# Patient Record
Sex: Male | Born: 1955 | Race: Black or African American | Hispanic: No | Marital: Married | Smoking: Former smoker
Health system: Southern US, Community
[De-identification: ages and names within clinical notes are randomized; demographics above are authoritative.]

## PROBLEM LIST (undated history)

## (undated) DIAGNOSIS — F1010 Alcohol abuse, uncomplicated: Secondary | ICD-10-CM

## (undated) DIAGNOSIS — I509 Heart failure, unspecified: Secondary | ICD-10-CM

## (undated) DIAGNOSIS — I10 Essential (primary) hypertension: Secondary | ICD-10-CM

## (undated) DIAGNOSIS — Z9119 Patient's noncompliance with other medical treatment and regimen: Secondary | ICD-10-CM

## (undated) DIAGNOSIS — E785 Hyperlipidemia, unspecified: Secondary | ICD-10-CM

## (undated) DIAGNOSIS — I4891 Unspecified atrial fibrillation: Secondary | ICD-10-CM

## (undated) DIAGNOSIS — Z91199 Patient's noncompliance with other medical treatment and regimen due to unspecified reason: Secondary | ICD-10-CM

## (undated) DIAGNOSIS — N183 Chronic kidney disease, stage 3 unspecified: Secondary | ICD-10-CM

## (undated) HISTORY — DX: Heart failure, unspecified: I50.9

---

## 2001-05-22 ENCOUNTER — Emergency Department (HOSPITAL_COMMUNITY): Admission: EM | Admit: 2001-05-22 | Discharge: 2001-05-22 | Payer: Self-pay | Admitting: *Deleted

## 2001-08-13 ENCOUNTER — Emergency Department (HOSPITAL_COMMUNITY): Admission: EM | Admit: 2001-08-13 | Discharge: 2001-08-13 | Payer: Self-pay | Admitting: Emergency Medicine

## 2008-02-03 ENCOUNTER — Emergency Department (HOSPITAL_COMMUNITY): Admission: EM | Admit: 2008-02-03 | Discharge: 2008-02-03 | Payer: Self-pay | Admitting: Emergency Medicine

## 2009-06-04 ENCOUNTER — Emergency Department (HOSPITAL_COMMUNITY): Admission: EM | Admit: 2009-06-04 | Discharge: 2009-06-04 | Payer: Self-pay | Admitting: Emergency Medicine

## 2009-06-18 ENCOUNTER — Emergency Department (HOSPITAL_COMMUNITY): Admission: EM | Admit: 2009-06-18 | Discharge: 2009-06-18 | Payer: Self-pay | Admitting: Emergency Medicine

## 2009-07-02 ENCOUNTER — Encounter: Payer: Self-pay | Admitting: Orthopedic Surgery

## 2011-04-30 LAB — CBC
HCT: 47.6
MCV: 81.5
Platelets: 178
RDW: 15.2

## 2011-04-30 LAB — DIFFERENTIAL
Basophils Absolute: 0
Lymphocytes Relative: 16
Monocytes Absolute: 0.6
Neutro Abs: 5.4
Neutrophils Relative %: 73

## 2011-04-30 LAB — COMPREHENSIVE METABOLIC PANEL
Albumin: 4
BUN: 11
Chloride: 103
Creatinine, Ser: 1.46
Total Bilirubin: 1.3 — ABNORMAL HIGH
Total Protein: 7.6

## 2011-04-30 LAB — URINALYSIS, ROUTINE W REFLEX MICROSCOPIC
Bilirubin Urine: NEGATIVE
Nitrite: NEGATIVE
Urobilinogen, UA: 0.2

## 2011-08-17 ENCOUNTER — Emergency Department (HOSPITAL_COMMUNITY)
Admission: EM | Admit: 2011-08-17 | Discharge: 2011-08-17 | Disposition: A | Discharge status: Home / Self Care | Payer: Self-pay | Attending: Emergency Medicine | Admitting: Emergency Medicine

## 2011-08-17 ENCOUNTER — Encounter (HOSPITAL_COMMUNITY): Payer: Self-pay

## 2011-08-17 DIAGNOSIS — IMO0002 Reserved for concepts with insufficient information to code with codable children: Secondary | ICD-10-CM | POA: Insufficient documentation

## 2011-08-17 DIAGNOSIS — I10 Essential (primary) hypertension: Secondary | ICD-10-CM | POA: Insufficient documentation

## 2011-08-17 DIAGNOSIS — Y92009 Unspecified place in unspecified non-institutional (private) residence as the place of occurrence of the external cause: Secondary | ICD-10-CM | POA: Insufficient documentation

## 2011-08-17 DIAGNOSIS — S39013A Strain of muscle, fascia and tendon of pelvis, initial encounter: Secondary | ICD-10-CM

## 2011-08-17 DIAGNOSIS — X500XXA Overexertion from strenuous movement or load, initial encounter: Secondary | ICD-10-CM | POA: Insufficient documentation

## 2011-08-17 HISTORY — DX: Essential (primary) hypertension: I10

## 2011-08-17 MED ORDER — HYDROCODONE-ACETAMINOPHEN 5-325 MG PO TABS: 1.0000 | ORAL_TABLET | ORAL | Status: AC | PRN

## 2011-08-17 MED ORDER — HYDROCODONE-ACETAMINOPHEN 5-325 MG PO TABS
1.0000 | ORAL_TABLET | Freq: Once | ORAL | Status: AC
  Administered 2011-08-17: 1 via ORAL
  Filled 2011-08-17: qty 1

## 2011-08-17 MED ORDER — IBUPROFEN 800 MG PO TABS: 800.0000 mg | ORAL_TABLET | Freq: Three times a day (TID) | ORAL | Status: AC

## 2011-08-17 MED ORDER — IBUPROFEN 800 MG PO TABS
800.0000 mg | ORAL_TABLET | Freq: Once | ORAL | Status: AC
  Administered 2011-08-17: 800 mg via ORAL
  Filled 2011-08-17: qty 1

## 2011-08-17 NOTE — ED Provider Notes (Signed)
History     CSN: [id number]  Arrival date & time 08/17/11  0710   First MD Initiated Contact with Patient 08/17/11 [phone]      Chief Complaint  Patient presents with  . Back Pain    (Consider location/radiation/quality/duration/timing/severity/associated sxs/prior treatment) HPI  Timothy [name] is a 56 y.o. male who presents to the Emergency Department complaining of right groin pain for two days following lifting a heavy basket of clothes on Friday. Pain increases when he is walking and when he bends his right leg. Nothing makes the pain better and he has taken no medicines. Denies fever, chills, numbness, tingling. Occasional radiation down the right thigh.   [facility]   Past Medical History  Diagnosis Date  . Hypertension     History reviewed. No pertinent past surgical history.  No family history on file.  History  Substance Use Topics  . Smoking status: Never Smoker   . Smokeless tobacco: Not on file  . Alcohol Use: Yes     occasionally      Review of Systems 10 Systems reviewed and are negative for acute change except as noted in the HPI. Allergies  Review of patient's allergies indicates no known allergies.  Home Medications  No current outpatient prescriptions on file.  BP 164/91  Pulse 94  Temp 98.3 F (36.8 C)  Resp 18  Ht 5\' 6"  (1.676 m)  Wt 210 lb (95.255 kg)  BMI 33.89 kg/m2  SpO2 97%  Physical Exam  Nursing note and vitals reviewed. Constitutional: He is oriented to person, place, and time. He appears well-developed and well-nourished.  HENT:  Head: Normocephalic.  Right Ear: External ear normal.  Left Ear: External ear normal.  Eyes: EOM are normal. Pupils are equal, round, and reactive to light.  Cardiovascular: Normal rate, normal heart sounds and intact distal pulses.   Pulmonary/Chest: Effort normal.  Abdominal: Soft.       Mild tenderness to Right groin area with palpation. No appreciated hernia.     Musculoskeletal:       FROM at right hip, knee, ankle. NO effusion to knee or ankle.   Neurological: He is alert and oriented to person, place, and time. He has normal reflexes.  Skin: Skin is warm and dry.    ED Course  Procedures (including critical care time)     MDM  Patient with muscle strain injury to right groin. Given antiinflammatory and analgesic.Pt stable in ED with no significant deterioration in condition.The patient appears reasonably screened and/or stabilized for discharge and I doubt any other medical condition or other [facility] requiring further screening, evaluation, or treatment in the ED at this time prior to discharge.  MDM Reviewed: nursing note and vitals           Nicoletta Dress. Colon Branch, MD 08/17/11 [id number]

## 2011-08-17 NOTE — ED Notes (Signed)
Pt c/o pain in r lower back radiating down r leg since Friday.   Denies injury.

## 2011-09-13 ENCOUNTER — Encounter (HOSPITAL_COMMUNITY): Payer: Self-pay

## 2011-09-13 ENCOUNTER — Emergency Department (HOSPITAL_COMMUNITY)
Admission: EM | Admit: 2011-09-13 | Discharge: 2011-09-13 | Disposition: A | Discharge status: Home / Self Care | Payer: Self-pay | Attending: Emergency Medicine | Admitting: Emergency Medicine

## 2011-09-13 DIAGNOSIS — M5430 Sciatica, unspecified side: Secondary | ICD-10-CM

## 2011-09-13 DIAGNOSIS — M25559 Pain in unspecified hip: Secondary | ICD-10-CM | POA: Insufficient documentation

## 2011-09-13 DIAGNOSIS — M79609 Pain in unspecified limb: Secondary | ICD-10-CM | POA: Insufficient documentation

## 2011-09-13 DIAGNOSIS — M545 Low back pain, unspecified: Secondary | ICD-10-CM | POA: Insufficient documentation

## 2011-09-13 DIAGNOSIS — I10 Essential (primary) hypertension: Secondary | ICD-10-CM | POA: Insufficient documentation

## 2011-09-13 MED ORDER — OXYCODONE-ACETAMINOPHEN 5-325 MG PO TABS: 1.0000 | ORAL_TABLET | ORAL | Status: AC | PRN

## 2011-09-13 MED ORDER — KETOROLAC TROMETHAMINE 60 MG/2ML IM SOLN
60.0000 mg | Freq: Once | INTRAMUSCULAR | Status: AC
  Administered 2011-09-13: 60 mg via INTRAMUSCULAR
  Filled 2011-09-13: qty 2

## 2011-09-13 MED ORDER — NAPROXEN 500 MG PO TABS: 500.0000 mg | ORAL_TABLET | Freq: Two times a day (BID) | ORAL | Status: DC

## 2011-09-13 MED ORDER — METHOCARBAMOL 500 MG PO TABS
1000.0000 mg | ORAL_TABLET | Freq: Once | ORAL | Status: AC
  Administered 2011-09-13: 1000 mg via ORAL
  Filled 2011-09-13: qty 2

## 2011-09-13 MED ORDER — CYCLOBENZAPRINE HCL 10 MG PO TABS: 10.0000 mg | ORAL_TABLET | Freq: Three times a day (TID) | ORAL | Status: AC | PRN

## 2011-09-13 NOTE — ED Provider Notes (Signed)
Medical screening examination/treatment/procedure(s) were performed by non-physician practitioner and as supervising physician I was immediately available for consultation/collaboration.   Loren Racer, MD 09/13/11 2340

## 2011-09-13 NOTE — ED Provider Notes (Signed)
History     CSN: [id number]  Arrival date & time 09/13/11  1851   First MD Initiated Contact with Patient 09/13/11 1911      Chief Complaint  Patient presents with  . Hip Pain  . Leg Pain    (Consider location/radiation/quality/duration/timing/severity/associated sxs/prior treatment) HPI Comments: Patient complains of right-sided low back pain for 2 weeks. He states pain radiates into his right hip and right upper leg. Pain is worse with excessive sitting and certain movements. Pain improves somewhat with rest.  He reports history of frequent back pain. He denies any numbness, weakness, incontinence of urine or feces, or abdominal pain. He has been alternating ice and heat to his back and hip without improvement.  Patient is a 56 y.o. male presenting with back pain. The history is provided by the patient. No language interpreter was used.  Back Pain  This is a new problem. The current episode started more than 1 week ago. The problem occurs constantly. The problem has not changed since onset.The pain is associated with no known injury. The pain is present in the lumbar spine and sacro-iliac joint. The quality of the pain is described as aching and shooting. The pain radiates to the right thigh. The pain is moderate. The symptoms are aggravated by bending, twisting and certain positions. The pain is the same all the time. Associated symptoms include leg pain. Pertinent negatives include no chest pain, no fever, no numbness, no headaches, no abdominal pain, no abdominal swelling, no bowel incontinence, no perianal numbness, no bladder incontinence, no dysuria, no pelvic pain, no paresthesias, no paresis, no tingling and no weakness. He has tried ice and heat for the symptoms. The treatment provided no relief.    Past Medical History  Diagnosis Date  . Hypertension     History reviewed. No pertinent past surgical history.  History reviewed. No pertinent family history.  History    Substance Use Topics  . Smoking status: Never Smoker   . Smokeless tobacco: Not on file  . Alcohol Use: Yes     occasionally      Review of Systems  Constitutional: Negative for fever, chills, activity change and appetite change.  HENT: Negative for neck pain and neck stiffness.   Respiratory: Negative for chest tightness and shortness of breath.   Cardiovascular: Negative for chest pain.  Gastrointestinal: Negative for nausea, vomiting, abdominal pain and bowel incontinence.  Genitourinary: Negative for bladder incontinence, dysuria, urgency, frequency, hematuria, decreased urine volume, difficulty urinating, testicular pain and pelvic pain.  Musculoskeletal: Positive for back pain. Negative for arthralgias.  Skin: Negative.   Neurological: Negative for dizziness, tingling, weakness, numbness, headaches and paresthesias.  Psychiatric/Behavioral: Negative for confusion and decreased concentration.  All other systems reviewed and are negative.    Allergies  Review of patient's allergies indicates no known allergies.  Home Medications  No current outpatient prescriptions on file.  BP 183/96  Pulse 88  Temp(Src) 97.5 F (36.4 C) (Oral)  Resp 18  Ht 5\' 4"  (1.626 m)  Wt 210 lb (95.255 kg)  BMI 36.05 kg/m2  SpO2 100%  Physical Exam  Nursing note and vitals reviewed. Constitutional: He is oriented to person, place, and time. He appears well-developed and well-nourished. No distress.  HENT:  Head: Normocephalic and atraumatic.  Neck: Normal range of motion. Neck supple.  Cardiovascular: Normal rate, regular rhythm, normal heart sounds and intact distal pulses.   No murmur heard. Pulmonary/Chest: Effort normal and breath sounds normal.  Abdominal: Soft. Bowel  sounds are normal.  Musculoskeletal: He exhibits tenderness. He exhibits no edema.       Lumbar back: He exhibits tenderness and pain. He exhibits normal range of motion, no bony tenderness, no swelling, no edema, no  deformity, no laceration and normal pulse.       Back:  Neurological: He is alert and oriented to person, place, and time. No cranial nerve deficit. He exhibits normal muscle tone. Gait normal.  Reflex Scores:      Patellar reflexes are 2+ on the right side and 2+ on the left side.      Achilles reflexes are 2+ on the right side and 2+ on the left side. Skin: Skin is warm and dry.    ED Course  Procedures (including critical care time)       MDM     Patient has tenderness to palpation of the right lumbar paraspinal muscles and right SI joint. He ambulates with a steady gait. No focal neuro deficits on exam.  Pain is likely related to sciatica. Will treat with pain medication, anti-inflammatory, and muscle relaxer, he agrees to close followup with his primary care physician if the symptoms are not improving.  Pt feels improved after observation and/or treatment in ED. Patient / Family / Caregiver understand and agree with initial ED impression and plan with expectations set for ED visit. Pt stable in ED with no significant deterioration in condition.    Terrace Chiem L. [place], [place] 09/13/11 2008

## 2011-09-13 NOTE — ED Notes (Signed)
Pt presents with right hip and leg pain x 2 weeks.

## 2012-05-30 ENCOUNTER — Emergency Department (HOSPITAL_COMMUNITY)
Admission: EM | Admit: 2012-05-30 | Discharge: 2012-05-30 | Disposition: A | Discharge status: Home / Self Care | Payer: Self-pay | Attending: Emergency Medicine | Admitting: Emergency Medicine

## 2012-05-30 ENCOUNTER — Encounter (HOSPITAL_COMMUNITY): Payer: Self-pay | Admitting: *Deleted

## 2012-05-30 DIAGNOSIS — Z79899 Other long term (current) drug therapy: Secondary | ICD-10-CM | POA: Insufficient documentation

## 2012-05-30 DIAGNOSIS — I10 Essential (primary) hypertension: Secondary | ICD-10-CM | POA: Insufficient documentation

## 2012-05-30 DIAGNOSIS — Z7982 Long term (current) use of aspirin: Secondary | ICD-10-CM | POA: Insufficient documentation

## 2012-05-30 MED ORDER — AMLODIPINE BESYLATE 10 MG PO TABS: 10.0000 mg | ORAL_TABLET | Freq: Every day | ORAL | Status: DC

## 2012-05-30 MED ORDER — QUINAPRIL-HYDROCHLOROTHIAZIDE 20-25 MG PO TABS: 1.0000 | ORAL_TABLET | Freq: Every day | ORAL | Status: DC

## 2012-05-30 NOTE — ED Provider Notes (Signed)
History   This chart was scribed for Shelda Jakes, MD by Gerlean Ren. This patient was seen in room APA15/APA15 and the patient's care was started at 12:38.   CSN: [id number]  Arrival date & time 05/30/12  1204   First MD Initiated Contact with Patient 05/30/12 1221      Chief Complaint  Patient presents with  . Hypertension    (Consider location/radiation/quality/duration/timing/severity/associated sxs/prior treatment) The history is provided by the patient. No language interpreter was used.   Timothy [name] is a 56 y.o. male with h/o hypertension sent to the Emergency Department from health department clinic for hypertension although he had no complaints or symptoms.  Pt has not taken BP medication for 4 months and went to fill unspecified meds at clinic today.  Denies HA, dyspnea, chest pain, and any other stroke-like symptoms.  BP is 223/126 during exam.  Pt has no further medical conditions.  Pt denies tobacco use but reports occasional alcohol use.    Past Medical History  Diagnosis Date  . Hypertension     History reviewed. No pertinent past surgical history.  History reviewed. No pertinent family history.  History  Substance Use Topics  . Smoking status: Never Smoker   . Smokeless tobacco: Not on file  . Alcohol Use: Yes     occasionally      Review of Systems  Constitutional: Negative for fever.  HENT: Negative for neck pain.   Eyes: Negative for visual disturbance.  Respiratory: Negative for chest tightness and shortness of breath.   Cardiovascular: Negative for chest pain and leg swelling.  Gastrointestinal: Negative for nausea, vomiting and abdominal pain.  Genitourinary: Negative for dysuria.  Musculoskeletal: Negative for back pain.  Skin: Negative for rash.  Neurological: Negative for dizziness, weakness, numbness and headaches.  Psychiatric/Behavioral: Negative for confusion.    Allergies  Review of patient's allergies indicates no known  allergies.  Home Medications   Current Outpatient Rx  Name Route Sig Dispense Refill  . ASPIRIN EC 81 MG PO TBEC Oral Take 81 mg by mouth daily.    Marland Kitchen AMLODIPINE BESYLATE 10 MG PO TABS Oral Take 1 tablet (10 mg total) by mouth daily. 30 tablet 3  . QUINAPRIL-HYDROCHLOROTHIAZIDE 20-25 MG PO TABS Oral Take 1 tablet by mouth daily. 30 tablet 3    BP 243/137  Pulse 81  Temp 97.9 F (36.6 C) (Oral)  Resp 20  Ht 5\' 6"  (1.676 m)  Wt 208 lb (94.348 kg)  BMI 33.57 kg/m2  SpO2 99%  Physical Exam  Nursing note and vitals reviewed. Constitutional: He is oriented to person, place, and time. He appears well-developed and well-nourished.  HENT:  Head: Normocephalic and atraumatic.  Eyes: EOM are normal.  Neck: Normal range of motion. No tracheal deviation present.  Cardiovascular: Normal rate, regular rhythm and normal heart sounds.   No murmur heard. Pulmonary/Chest: Effort normal and breath sounds normal. He has no wheezes.  Abdominal: Soft. Bowel sounds are normal. There is no tenderness.  Musculoskeletal: Normal range of motion. He exhibits no edema.  Neurological: He is alert and oriented to person, place, and time.  Skin: Skin is warm.    ED Course  Procedures (including critical care time) DIAGNOSTIC STUDIES: Oxygen Saturation is 99% on room air, normal by my interpretation.    COORDINATION OF CARE: 12:43- Patient informed of clinical course, understands medical decision-making process, and agrees with plan.  Informed pt that high BP when off medication is normal.  Labs Reviewed - No data to display No results found.   Date: 05/30/2012  Rate: 80  Rhythm: normal sinus rhythm  QRS Axis: normal  Intervals: normal  ST/T Wave abnormalities: normal  Conduction Disutrbances:none  Narrative Interpretation:   Old EKG Reviewed: none available Left ventricular hypertrophy.   1. Hypertension       MDM  Patient with long-standing hypertension has been out of his  medication for about 3-4 months. Patient was at health department today to have his blood pressure medicine renewed the nose blood pressure was very high but patient is completely asymptomatic and has been. We'll renew patient's blood pressure medicine and have him follow back up with the health department next week for recheck. Patient has no symptoms of headache and strokelike symptoms chest pain or shortness of breath.      I personally performed the services described in this documentation, which was scribed in my presence. The recorded information has been reviewed and considered.     Shelda Jakes, MD 05/30/12 1323

## 2012-05-30 NOTE — ED Notes (Signed)
Has not taken bp med for 3-4 mos, went to clinic to get bp checked and get a refill on bp meds and bp was too high, Sent to ER

## 2013-03-19 ENCOUNTER — Emergency Department (HOSPITAL_COMMUNITY)
Admission: EM | Admit: 2013-03-19 | Discharge: 2013-03-20 | Disposition: A | Discharge status: Home / Self Care | Payer: No Typology Code available for payment source | Attending: Emergency Medicine | Admitting: Emergency Medicine

## 2013-03-19 ENCOUNTER — Encounter (HOSPITAL_COMMUNITY): Payer: Self-pay | Admitting: Emergency Medicine

## 2013-03-19 DIAGNOSIS — S0181XA Laceration without foreign body of other part of head, initial encounter: Secondary | ICD-10-CM

## 2013-03-19 DIAGNOSIS — I10 Essential (primary) hypertension: Secondary | ICD-10-CM | POA: Insufficient documentation

## 2013-03-19 DIAGNOSIS — S0180XA Unspecified open wound of other part of head, initial encounter: Secondary | ICD-10-CM | POA: Insufficient documentation

## 2013-03-19 DIAGNOSIS — Z79899 Other long term (current) drug therapy: Secondary | ICD-10-CM | POA: Insufficient documentation

## 2013-03-19 DIAGNOSIS — F10929 Alcohol use, unspecified with intoxication, unspecified: Secondary | ICD-10-CM

## 2013-03-19 DIAGNOSIS — Z7982 Long term (current) use of aspirin: Secondary | ICD-10-CM | POA: Insufficient documentation

## 2013-03-19 DIAGNOSIS — F1010 Alcohol abuse, uncomplicated: Secondary | ICD-10-CM | POA: Insufficient documentation

## 2013-03-19 DIAGNOSIS — Y9389 Activity, other specified: Secondary | ICD-10-CM | POA: Insufficient documentation

## 2013-03-19 DIAGNOSIS — Y92410 Unspecified street and highway as the place of occurrence of the external cause: Secondary | ICD-10-CM | POA: Insufficient documentation
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
## 2013-03-20 ENCOUNTER — Emergency Department (HOSPITAL_COMMUNITY): Payer: No Typology Code available for payment source

## 2013-03-20 MED ORDER — VITAMIN B-1 50 MG PO TABS: 50.0000 mg | ORAL_TABLET | Freq: Every day | ORAL | Status: DC

## 2013-03-20 MED ORDER — ACETAMINOPHEN 500 MG PO TABS
1000.0000 mg | ORAL_TABLET | Freq: Once | ORAL | Status: AC
  Administered 2013-03-20: 1000 mg via ORAL
  Filled 2013-03-20: qty 2
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
## 2013-03-20 NOTE — ED Notes (Signed)
Cleaned wound and placed sterile dressing on it. Covered with bandaid.

## 2013-03-20 NOTE — ED Provider Notes (Signed)
Medical screening examination/treatment/procedure(s) were performed by non-physician practitioner and as supervising physician I was immediately available for consultation/collaboration.    Vida Roller, MD 03/20/13 [phone]

## 2013-08-03 DIAGNOSIS — I10 Essential (primary) hypertension: Secondary | ICD-10-CM

## 2013-08-03 HISTORY — DX: Essential (primary) hypertension: I10

## 2013-08-15 ENCOUNTER — Emergency Department (HOSPITAL_COMMUNITY)
Admission: EM | Admit: 2013-08-15 | Discharge: 2013-08-15 | Disposition: A | Discharge status: Home / Self Care | Payer: No Typology Code available for payment source | Attending: Emergency Medicine | Admitting: Emergency Medicine

## 2013-08-15 ENCOUNTER — Encounter (HOSPITAL_COMMUNITY): Payer: Self-pay | Admitting: Emergency Medicine

## 2013-08-15 DIAGNOSIS — I10 Essential (primary) hypertension: Secondary | ICD-10-CM

## 2013-08-15 DIAGNOSIS — Z7982 Long term (current) use of aspirin: Secondary | ICD-10-CM | POA: Insufficient documentation

## 2013-08-15 DIAGNOSIS — Z79899 Other long term (current) drug therapy: Secondary | ICD-10-CM | POA: Insufficient documentation

## 2013-08-15 MED ORDER — HYDROCHLOROTHIAZIDE 25 MG PO TABS: 25.0000 mg | ORAL_TABLET | Freq: Every day | ORAL | Status: DC

## 2013-08-15 MED ORDER — CLONIDINE HCL 0.2 MG PO TABS
ORAL_TABLET | ORAL | Status: AC
  Filled 2013-08-15: qty 1

## 2013-08-15 MED ORDER — AMLODIPINE BESYLATE 10 MG PO TABS: 10.0000 mg | ORAL_TABLET | Freq: Every day | ORAL | Status: DC

## 2013-08-15 MED ORDER — CLONIDINE HCL 0.2 MG PO TABS
0.2000 mg | ORAL_TABLET | Freq: Once | ORAL | Status: AC
  Administered 2013-08-15: 0.2 mg via ORAL

## 2013-08-15 NOTE — ED Provider Notes (Signed)
CSN: [id number]     Arrival date & time 08/15/13  1120 History   This chart was scribed for Nat Christen, MD, by Neta Ehlers, ED Scribe. This patient was seen in room APA11/APA11 and the patient's care was started at 12:29 PM.  First MD Initiated Contact with Patient 08/15/13 1140     Chief Complaint  Patient presents with  . Hypertension   The history is provided by the patient and the spouse. No language interpreter was used.   HPI Comments: Timothy [name] is a 58 y.o. male, with a h/o HTN, who presents to the Emergency Department complaining of hypertension. The pt reports he  has been out of Norvasc for a three months, and when his BP was checked today, it was 249/33. He denies chest pain or SOB. He was diagnosed with HTN approximately ten years ago. The pt is a non-smoker. He has no somatic complaints.   No neurological deficits  The pt uses the [facility] [place] as his PCP.   The pt is unemployed.   Past Medical History  Diagnosis Date  . Hypertension    History reviewed. No pertinent past surgical history. History reviewed. No pertinent family history. History  Substance Use Topics  . Smoking status: Never Smoker   . Smokeless tobacco: Not on file  . Alcohol Use: Yes     Comment: daily    Review of Systems  Respiratory: Negative for shortness of breath.   Cardiovascular: Negative for chest pain.  All other systems reviewed and are negative.   Allergies  Review of patient's allergies indicates no known allergies.  Home Medications   Current Outpatient Rx  Name  Route  Sig  Dispense  Refill  . amLODipine (NORVASC) 10 MG tablet   Oral   Take 1 tablet (10 mg total) by mouth daily.   30 tablet   3   . aspirin EC 81 MG tablet   Oral   Take 81 mg by mouth daily.         Marland Kitchen thiamine (VITAMIN B-1) 50 MG tablet   Oral   Take 1 tablet (50 mg total) by mouth daily.   30 tablet   0    Triage Vitals: BP 252/134  Pulse 93  Temp(Src) 97.8 F (36.6  C) (Oral)  Resp 18  Ht 5\' 4"  (1.626 m)  Wt 225 lb (102.059 kg)  BMI 38.60 kg/m2  SpO2 98%  Physical Exam  Nursing note and vitals reviewed. Constitutional: He is oriented to person, place, and time. He appears well-developed and well-nourished.  Blood pressure elevated  HENT:  Head: Normocephalic and atraumatic.  Eyes: Conjunctivae and EOM are normal. Pupils are equal, round, and reactive to light.  Neck: Normal range of motion. Neck supple.  Cardiovascular: Normal rate, regular rhythm and normal heart sounds.   Pulmonary/Chest: Effort normal and breath sounds normal.  Abdominal: Soft. Bowel sounds are normal.  Musculoskeletal: Normal range of motion.  Neurological: He is alert and oriented to person, place, and time.  Skin: Skin is warm and dry.  Psychiatric: He has a normal mood and affect. His behavior is normal.    ED Course  Procedures (including critical care time)  DIAGNOSTIC STUDIES: Oxygen Saturation is 98% on room air, normal by my interpretation.    COORDINATION OF CARE:  12:33 PM- Discussed treatment plan with patient, which includes anti-hypertensive medicine and a prescription for Norvasc, and the patient agreed to the plan.   Labs Review Labs  Reviewed - No data to display Imaging Review No results found.  EKG Interpretation   None       MDM  No diagnosis found. Patient has severe hypertension. He is asymptomatic. Will Rx Norvasc 10 mg daily and hydrochlorothiazide 25 mg daily. Patient and his wife understand to get primary care followup ASAP.  I personally performed the services described in this documentation, which was scribed in my presence. The recorded information has been reviewed and is accurate.    Nat Christen, MD 08/15/13 [phone]

## 2013-08-15 NOTE — Discharge Instructions (Signed)
Arterial Hypertension °Arterial hypertension (high blood pressure) is a condition of elevated pressure in your blood vessels. Hypertension over a long period of time is a risk factor for strokes, heart attacks, and heart failure. It is also the leading cause of kidney (renal) failure.  °CAUSES  °· In Adults -- Over 90% of all hypertension has no known cause. This is called essential or primary hypertension. In the other 10% of people with hypertension, the increase in blood pressure is caused by another disorder. This is called secondary hypertension. Important causes of secondary hypertension are: °· Heavy alcohol use. °· Obstructive sleep apnea. °· Hyperaldosterosim (Conn's syndrome). °· Steroid use. °· Chronic kidney failure. °· Hyperparathyroidism. °· Medications. °· Renal artery stenosis. °· Pheochromocytoma. °· Cushing's disease. °· Coarctation of the aorta. °· Scleroderma renal crisis. °· Licorice (in excessive amounts). °· Drugs (cocaine, methamphetamine). °Your caregiver can explain any items above that apply to you. °· In Children -- Secondary hypertension is more common and should always be considered. °· Pregnancy -- Few women of childbearing age have high blood pressure. However, up to 10% of them develop hypertension of pregnancy. Generally, this will not harm the woman. It may be a sign of 3 complications of pregnancy: preeclampsia, HELLP syndrome, and eclampsia. Follow up and control with medication is necessary. °SYMPTOMS  °· This condition normally does not produce any noticeable symptoms. It is usually found during a routine exam. °· Malignant hypertension is a late problem of high blood pressure. It may have the following symptoms: °· Headaches. °· Blurred vision. °· End-organ damage (this means your kidneys, heart, lungs, and other organs are being damaged). °· Stressful situations can increase the blood pressure. If a person with normal blood pressure has their blood pressure go up while being  seen by their caregiver, this is often termed "white coat hypertension." Its importance is not known. It may be related with eventually developing hypertension or complications of hypertension. °· Hypertension is often confused with mental tension, stress, and anxiety. °DIAGNOSIS  °The diagnosis is made by 3 separate blood pressure measurements. They are taken at least 1 week apart from each other. If there is organ damage from hypertension, the diagnosis may be made without repeat measurements. °Hypertension is usually identified by having blood pressure readings: °· Above 140/90 mmHg measured in both arms, at 3 separate times, over a couple weeks. °· Over 130/80 mmHg should be considered a risk factor and may require treatment in patients with diabetes. °Blood pressure readings over 120/80 mmHg are called "pre-hypertension" even in non-diabetic patients. °To get a true blood pressure measurement, use the following guidelines. Be aware of the factors that can alter blood pressure readings. °· Take measurements at least 1 hour after caffeine. °· Take measurements 30 minutes after smoking and without any stress. This is another reason to quit smoking  it raises your blood pressure. °· Use a proper cuff size. Ask your caregiver if you are not sure about your cuff size. °· Most home blood pressure cuffs are automatic. They will measure systolic and diastolic pressures. The systolic pressure is the pressure reading at the start of sounds. Diastolic pressure is the pressure at which the sounds disappear. If you are elderly, measure pressures in multiple postures. Try sitting, lying or standing. °· Sit at rest for a minimum of 5 minutes before taking measurements. °· You should not be on any medications like decongestants. These are found in many cold medications. °· Record your blood pressure readings and review   them with your caregiver. °If you have hypertension: °· Your caregiver may do tests to be sure you do not have  secondary hypertension (see "causes" above). °· Your caregiver may also look for signs of metabolic syndrome. This is also called Syndrome X or Insulin Resistance Syndrome. You may have this syndrome if you have type 2 diabetes, abdominal obesity, and abnormal blood lipids in addition to hypertension. °· Your caregiver will take your medical and family history and perform a physical exam. °· Diagnostic tests may include blood tests (for glucose, cholesterol, potassium, and kidney function), a urinalysis, or an EKG. Other tests may also be necessary depending on your condition. °PREVENTION  °There are important lifestyle issues that you can adopt to reduce your chance of developing hypertension: °· Maintain a normal weight. °· Limit the amount of salt (sodium) in your diet. °· Exercise often. °· Limit alcohol intake. °· Get enough potassium in your diet. Discuss specific advice with your caregiver. °· Follow a DASH diet (dietary approaches to stop hypertension). This diet is rich in fruits, vegetables, and low-fat dairy products, and avoids certain fats. °PROGNOSIS  °Essential hypertension cannot be cured. Lifestyle changes and medical treatment can lower blood pressure and reduce complications. The prognosis of secondary hypertension depends on the underlying cause. Many people whose hypertension is controlled with medicine or lifestyle changes can live a normal, healthy life.  °RISKS AND COMPLICATIONS  °While high blood pressure alone is not an illness, it often requires treatment due to its short- and long-term effects on many organs. Hypertension increases your risk for: °· CVAs or strokes (cerebrovascular accident). °· Heart failure due to chronically high blood pressure (hypertensive cardiomyopathy). °· Heart attack (myocardial infarction). °· Damage to the retina (hypertensive retinopathy). °· Kidney failure (hypertensive nephropathy). °Your caregiver can explain list items above that apply to you. Treatment  of hypertension can significantly reduce the risk of complications. °TREATMENT  °· For overweight patients, weight loss and regular exercise are recommended. Physical fitness lowers blood pressure. °· Mild hypertension is usually treated with diet and exercise. A diet rich in fruits and vegetables, fat-free dairy products, and foods low in fat and salt (sodium) can help lower blood pressure. Decreasing salt intake decreases blood pressure in a 1/3 of people. °· Stop smoking if you are a smoker. °The steps above are highly effective in reducing blood pressure. While these actions are easy to suggest, they are difficult to achieve. Most patients with moderate or severe hypertension end up requiring medications to bring their blood pressure down to a normal level. There are several classes of medications for treatment. Blood pressure pills (antihypertensives) will lower blood pressure by their different actions. Lowering the blood pressure by 10 mmHg may decrease the risk of complications by as much as 25%. °The goal of treatment is effective blood pressure control. This will reduce your risk for complications. Your caregiver will help you determine the best treatment for you according to your lifestyle. What is excellent treatment for one person, may not be for you. °HOME CARE INSTRUCTIONS  °· Do not smoke. °· Follow the lifestyle changes outlined in the "Prevention" section. °· If you are on medications, follow the directions carefully. Blood pressure medications must be taken as prescribed. Skipping doses reduces their benefit. It also puts you at risk for problems. °· Follow up with your caregiver, as directed. °· If you are asked to monitor your blood pressure at home, follow the guidelines in the "Diagnosis" section above. °SEEK MEDICAL CARE   IF:   You think you are having medication side effects.  You have recurrent headaches or lightheadedness.  You have swelling in your ankles.  You have trouble with  your vision. SEEK IMMEDIATE MEDICAL CARE IF:   You have sudden onset of chest pain or pressure, difficulty breathing, or other symptoms of a heart attack.  You have a severe headache.  You have symptoms of a stroke (such as sudden weakness, difficulty speaking, difficulty walking). MAKE SURE YOU:   Understand these instructions.  Will watch your condition.  Will get help right away if you are not doing well or get worse. Document Released: 07/20/2005 Document Revised: 10/12/2011 Document Reviewed: 02/17/2007 [facility] Patient Information 2014 [place].   You have significant high blood pressure. You must take care of this. Prescriptions for 2 blood pressure medications for one month with one refill. Followup with [facility] is very important

## 2013-08-15 NOTE — ED Notes (Signed)
Pt has run out of HTN for couple of months, had BP checked by nurse at [facility] and was 249/133

## 2015-10-29 ENCOUNTER — Ambulatory Visit: Payer: Self-pay | Admitting: Physician Assistant

## 2015-10-29 ENCOUNTER — Encounter: Payer: Self-pay | Admitting: Physician Assistant

## 2015-10-29 VITALS — BP 206/120 | HR 89 | Temp 97.9°F | Ht 66.5 in | Wt 210.0 lb

## 2015-10-29 DIAGNOSIS — E669 Obesity, unspecified: Secondary | ICD-10-CM | POA: Insufficient documentation

## 2015-10-29 DIAGNOSIS — I10 Essential (primary) hypertension: Secondary | ICD-10-CM | POA: Insufficient documentation

## 2015-10-29 DIAGNOSIS — Z125 Encounter for screening for malignant neoplasm of prostate: Secondary | ICD-10-CM

## 2015-10-29 DIAGNOSIS — E782 Mixed hyperlipidemia: Secondary | ICD-10-CM | POA: Insufficient documentation

## 2015-10-29 DIAGNOSIS — N189 Chronic kidney disease, unspecified: Secondary | ICD-10-CM

## 2015-10-29 DIAGNOSIS — E785 Hyperlipidemia, unspecified: Secondary | ICD-10-CM

## 2015-10-29 DIAGNOSIS — Z131 Encounter for screening for diabetes mellitus: Secondary | ICD-10-CM

## 2015-10-29 MED ORDER — LISINOPRIL-HYDROCHLOROTHIAZIDE 20-25 MG PO TABS: 1.0000 | ORAL_TABLET | Freq: Every day | ORAL | Status: DC

## 2015-10-29 MED ORDER — CLONIDINE HCL 0.1 MG PO TABS
0.1000 mg | ORAL_TABLET | Freq: Once | ORAL | Status: AC
  Administered 2015-10-29: 0.1 mg via ORAL

## 2015-10-29 MED ORDER — METOPROLOL TARTRATE 50 MG PO TABS: 50.0000 mg | ORAL_TABLET | Freq: Two times a day (BID) | ORAL | Status: DC

## 2015-10-29 NOTE — Patient Instructions (Signed)
Low-Sodium Eating Plan °Sodium raises blood pressure and causes water to be held in the body. Getting less sodium from food will help lower your blood pressure, reduce any swelling, and protect your heart, liver, and kidneys. We get sodium by adding salt (sodium chloride) to food. Most of our sodium comes from canned, boxed, and frozen foods. Restaurant foods, fast foods, and pizza are also very high in sodium. Even if you take medicine to lower your blood pressure or to reduce fluid in your body, getting less sodium from your food is important. °WHAT IS MY PLAN? °Most people should limit their sodium intake to 2,300 mg a day. Your health care provider recommends that you limit your sodium intake to __________ a day.  °WHAT DO I NEED TO KNOW ABOUT THIS EATING PLAN? °For the low-sodium eating plan, you will follow these general guidelines: °· Choose foods with a % Daily Value for sodium of less than 5% (as listed on the food label).   °· Use salt-free seasonings or herbs instead of table salt or sea salt.   °· Check with your health care provider or pharmacist before using salt substitutes.   °· Eat fresh foods. °· Eat more vegetables and fruits. °· Limit canned vegetables. If you do use them, rinse them well to decrease the sodium.   °· Limit cheese to 1 oz (28 g) per day.    °· Eat lower-sodium products, often labeled as "lower sodium" or "no salt added." °· Avoid foods that contain monosodium glutamate (MSG). MSG is sometimes added to Chinese food and some canned foods.   °· Check food labels (Nutrition Facts labels) on foods to learn how much sodium is in one serving. °· Eat more home-cooked food and less restaurant, buffet, and fast food.  °· When eating at a restaurant, ask that your food be prepared with less salt, or no salt if possible.   °HOW DO I READ FOOD LABELS FOR SODIUM INFORMATION? °The Nutrition Facts label lists the amount of sodium in one serving of the food. If you eat more than one serving, you  must multiply the listed amount of sodium by the number of servings. °Food labels may also identify foods as: °· Sodium free--Less than 5 mg in a serving. °· Very low sodium--35 mg or less in a serving. °· Low sodium--140 mg or less in a serving. °· Light in sodium--50% less sodium in a serving. For example, if a food that usually has 300 mg of sodium is changed to become light in sodium, it will have 150 mg of sodium. °· Reduced sodium--25% less sodium in a serving. For example, if a food that usually has 400 mg of sodium is changed to reduced sodium, it will have 300 mg of sodium. °WHAT FOODS CAN I EAT? °Grains  °Low-sodium cereals, including oats, puffed wheat and rice, and shredded wheat cereals. Low-sodium crackers. Unsalted rice and pasta. Lower-sodium bread.  °Vegetables  °Frozen or fresh vegetables. Low-sodium or reduced-sodium canned vegetables. Low-sodium or reduced-sodium tomato sauce and paste. Low-sodium or reduced-sodium tomato and vegetable juices.  °Fruits  °Fresh, frozen, and canned fruit. Fruit juice.  °Meat and Other Protein Products  °Low-sodium canned tuna and salmon. Fresh or frozen meat, poultry, seafood, and fish. Lamb. Unsalted nuts. Dried beans, peas, and lentils without added salt. Unsalted canned beans. Homemade soups without salt. Eggs.  °Dairy  °Milk. Soy milk. Ricotta cheese. Low-sodium or reduced-sodium cheeses. Yogurt.  °Condiments  °Fresh and dried herbs and spices. Salt-free seasonings. Onion and garlic powders. Low-sodium varieties of mustard and ketchup. Fresh or refrigerated horseradish. Lemon   juice.  °Fats and Oils   °Reduced-sodium salad dressings. Unsalted butter.   °Other  °Unsalted popcorn and pretzels.  °The items listed above may not be a complete list of recommended foods or beverages. Contact your dietitian for more options. °WHAT FOODS ARE NOT RECOMMENDED? °Grains  °Instant hot cereals. Bread stuffing, pancake, and biscuit mixes. Croutons. Seasoned rice or pasta mixes.  Noodle soup cups. Boxed or frozen macaroni and cheese. Self-rising flour. Regular salted crackers. °Vegetables  °Regular canned vegetables. Regular canned tomato sauce and paste. Regular tomato and vegetable juices. Frozen vegetables in sauces. Salted French fries. Olives. Pickles. Relishes. Sauerkraut. Salsa. °Meat and Other Protein Products  °Salted, canned, smoked, spiced, or pickled meats, seafood, or fish. Bacon, ham, sausage, hot dogs, corned beef, chipped beef, and packaged luncheon meats. Salt pork. Jerky. Pickled herring. Anchovies, regular canned tuna, and sardines. Salted nuts. °Dairy  °Processed cheese and cheese spreads. Cheese curds. Blue cheese and cottage cheese. Buttermilk.  °Condiments  °Onion and garlic salt, seasoned salt, table salt, and sea salt. Canned and packaged gravies. Worcestershire sauce. Tartar sauce. Barbecue sauce. Teriyaki sauce. Soy sauce, including reduced sodium. Steak sauce. Fish sauce. Oyster sauce. Cocktail sauce. Horseradish that you find on the shelf. Regular ketchup and mustard. Meat flavorings and tenderizers. Bouillon cubes. Hot sauce. Tabasco sauce. Marinades. Taco seasonings. Relishes. °Fats and Oils   °Regular salad dressings. Salted butter. Margarine. Ghee. Bacon fat.  °Other  °Potato and tortilla chips. Corn chips and puffs. Salted popcorn and pretzels. Canned or dried soups. Pizza. Frozen entrees and pot pies.   °The items listed above may not be a complete list of foods and beverages to avoid. Contact your dietitian for more information. °  °This information is not intended to replace advice given to you by your health care provider. Make sure you discuss any questions you have with your health care provider. °  °Document Released: 01/09/2002 Document Revised: 08/10/2014 Document Reviewed: 05/24/2013 °Elsevier Interactive Patient Education ©2016 [employer] ° °

## 2015-10-29 NOTE — Progress Notes (Signed)
BP 242/140 mmHg  Pulse 89  Temp(Src) 97.9 F (36.6 C)  Ht 5' 6.5" (1.689 m)  Wt 210 lb (95.255 kg)  BMI 33.39 kg/m2  SpO2 97%   Subjective:    Patient ID: Timothy [name], male    DOB: 07/22/1956, 60 y.o.   MRN: [id number]  HPI: Timothy [name] is a 60 y.o. male presenting on 10/29/2015 for New Patient (Initial Visit)   HPI Pt dismissed early 2016 due to no-shows. Today he returns.  He is feeling well.  His wife accompanies him today.  Relevant past medical, surgical, family and social history reviewed and updated as indicated. Interim medical history since our last visit reviewed. Allergies and medications reviewed and updated.   Current outpatient prescriptions:  .  aspirin EC 81 MG tablet, Take 81 mg by mouth daily. Reported on 10/29/2015, Disp: , Rfl:      Review of Systems  Constitutional: Positive for fatigue. Negative for fever, chills, diaphoresis, appetite change and unexpected weight change.  HENT: Positive for hearing loss. Negative for congestion, dental problem, drooling, ear pain, facial swelling, mouth sores, sneezing, sore throat, trouble swallowing and voice change.   Eyes: Negative for pain, discharge, redness, itching and visual disturbance.  Respiratory: Negative for cough, choking, shortness of breath and wheezing.   Cardiovascular: Negative for chest pain, palpitations and leg swelling.  Gastrointestinal: Negative for vomiting, abdominal pain, diarrhea, constipation and blood in stool.  Endocrine: Negative for cold intolerance, heat intolerance and polydipsia.  Genitourinary: Negative for dysuria, hematuria and decreased urine volume.  Musculoskeletal: Negative for back pain, arthralgias and gait problem.  Skin: Negative for rash.  Allergic/Immunologic: Negative for environmental allergies.  Neurological: Negative for seizures, syncope, light-headedness and headaches.  Hematological: Negative for adenopathy.  Psychiatric/Behavioral: Negative for  suicidal ideas, dysphoric mood and agitation. The patient is not nervous/anxious.     Per HPI unless specifically indicated above     Objective:    BP 242/140 mmHg  Pulse 89  Temp(Src) 97.9 F (36.6 C)  Ht 5' 6.5" (1.689 m)  Wt 210 lb (95.255 kg)  BMI 33.39 kg/m2  SpO2 97%  Wt Readings from Last 3 Encounters:  10/29/15 210 lb (95.255 kg)  08/15/13 225 lb (102.059 kg)  03/19/13 210 lb (95.255 kg)    Physical Exam  Constitutional: He is oriented to person, place, and time. He appears well-developed and well-nourished.  HENT:  Head: Normocephalic and atraumatic.  Mouth/Throat: Oropharynx is clear and moist. No oropharyngeal exudate.  Eyes: Conjunctivae and EOM are normal. Pupils are equal, round, and reactive to light.  Neck: Neck supple. No thyromegaly present.  Cardiovascular: Normal rate and regular rhythm.   Pulmonary/Chest: Effort normal and breath sounds normal. He has no wheezes. He has no rales.  Abdominal: Soft. Bowel sounds are normal. He exhibits no mass. There is no hepatosplenomegaly. There is no tenderness.  Musculoskeletal: He exhibits no edema.  Lymphadenopathy:    He has no cervical adenopathy.  Neurological: He is alert and oriented to person, place, and time.  Skin: Skin is warm and dry. No rash noted.  Psychiatric: He has a normal mood and affect. His behavior is normal. Thought content normal.  Vitals reviewed.       Assessment & Plan:   Encounter Diagnoses  Name Primary?  . Essential hypertension, benign Yes  . Hyperlipidemia   . Obesity, unspecified   . CKD (chronic kidney disease), unspecified stage   . Screening for prostate cancer   .  Screening for diabetes mellitus     -in office, clonidine 0.1mg  given at 1:29pm and pt monitored.  bp rechecked- 206/120 at 2:08pm  -rx metoprolol 50mg  bid and lisinopril/hctz 20/25 qd -low-sodium diet handout given -get fasting labs drawn -counseled on need for f/u and ongoing care -counseled to avoid  over-exertion due to extremely high bp.  -Counseled to go to ER for HA, vision changes, CP -f/u appt 2 week

## 2015-10-30 ENCOUNTER — Other Ambulatory Visit: Payer: Self-pay | Admitting: Physician Assistant

## 2015-10-30 LAB — COMPLETE METABOLIC PANEL WITH GFR
ALBUMIN: 4 g/dL (ref 3.6–5.1)
ALK PHOS: 75 U/L (ref 40–115)
ALT: 13 U/L (ref 9–46)
AST: 20 U/L (ref 10–35)
BUN: 14 mg/dL (ref 7–25)
CHLORIDE: 105 mmol/L (ref 98–110)
CO2: 26 mmol/L (ref 20–31)
Calcium: 9 mg/dL (ref 8.6–10.3)
Creat: 1.2 mg/dL (ref 0.70–1.25)
GFR, EST NON AFRICAN AMERICAN: 64 mL/min (ref >=60)
GFR, Est African American: 74 mL/min (ref >=60)
GLUCOSE: 131 mg/dL — AB (ref 65–99)
POTASSIUM: 4.6 mmol/L (ref 3.5–5.3)
SODIUM: 139 mmol/L (ref 135–146)
Total Bilirubin: 0.9 mg/dL (ref 0.2–1.2)
Total Protein: 6.5 g/dL (ref 6.1–8.1)

## 2015-10-30 LAB — LIPID PANEL
CHOL/HDL RATIO: 3.6 ratio (ref <=5.0)
Cholesterol: 208 mg/dL — ABNORMAL HIGH (ref 125–200)
HDL: 57 mg/dL (ref >=40)
LDL CALC: 128 mg/dL (ref <130)
Triglycerides: 115 mg/dL (ref <150)
VLDL: 23 mg/dL (ref <30)

## 2015-10-30 LAB — CBC WITH DIFFERENTIAL/PLATELET
BASOS ABS: 0 10*3/uL (ref 0.0–0.1)
Basophils Relative: 1 % (ref 0–1)
EOS ABS: 0.3 10*3/uL (ref 0.0–0.7)
EOS PCT: 6 % — AB (ref 0–5)
HEMATOCRIT: 49.2 % (ref 39.0–52.0)
Hemoglobin: 16.8 g/dL (ref 13.0–17.0)
LYMPHS ABS: 1.6 10*3/uL (ref 0.7–4.0)
LYMPHS PCT: 34 % (ref 12–46)
MCH: 27.9 pg (ref 26.0–34.0)
MCHC: 34.1 g/dL (ref 30.0–36.0)
MCV: 81.7 fL (ref 78.0–100.0)
MPV: 9.9 fL (ref 8.6–12.4)
Monocytes Absolute: 0.5 10*3/uL (ref 0.1–1.0)
Monocytes Relative: 11 % (ref 3–12)
NEUTROS PCT: 48 % (ref 43–77)
Neutro Abs: 2.3 10*3/uL (ref 1.7–7.7)
PLATELETS: 146 10*3/uL — AB (ref 150–400)
RBC: 6.02 MIL/uL — AB (ref 4.22–5.81)
RDW: 14.5 % (ref 11.5–15.5)
WBC: 4.8 10*3/uL (ref 4.0–10.5)

## 2015-10-30 MED ORDER — METOPROLOL TARTRATE 50 MG PO TABS: 50.0000 mg | ORAL_TABLET | Freq: Two times a day (BID) | ORAL | Status: DC

## 2015-10-30 MED ORDER — LISINOPRIL-HYDROCHLOROTHIAZIDE 20-25 MG PO TABS: 1.0000 | ORAL_TABLET | Freq: Every day | ORAL | Status: DC

## 2015-10-31 LAB — HEMOGLOBIN A1C
Hgb A1c MFr Bld: 6.1 % — ABNORMAL HIGH (ref <5.7)
Mean Plasma Glucose: 128 mg/dL

## 2015-10-31 LAB — PSA: PSA: 1.18 ng/mL (ref <=4.00)

## 2015-11-11 ENCOUNTER — Encounter: Payer: Self-pay | Admitting: Physician Assistant

## 2015-11-11 ENCOUNTER — Ambulatory Visit: Payer: Self-pay | Admitting: Physician Assistant

## 2015-11-11 VITALS — BP 182/104 | HR 59 | Temp 97.3°F | Ht 66.5 in | Wt 209.8 lb

## 2015-11-11 DIAGNOSIS — E785 Hyperlipidemia, unspecified: Secondary | ICD-10-CM

## 2015-11-11 DIAGNOSIS — E669 Obesity, unspecified: Secondary | ICD-10-CM

## 2015-11-11 DIAGNOSIS — R7303 Prediabetes: Secondary | ICD-10-CM

## 2015-11-11 DIAGNOSIS — I10 Essential (primary) hypertension: Secondary | ICD-10-CM

## 2015-11-11 MED ORDER — LOVASTATIN 20 MG PO TABS
20.0000 mg | ORAL_TABLET | Freq: Every day | ORAL | Status: DC
Start: 2015-11-11 — End: 2016-01-09

## 2015-11-11 MED ORDER — METOPROLOL TARTRATE 100 MG PO TABS: 100.0000 mg | ORAL_TABLET | Freq: Two times a day (BID) | ORAL | Status: DC

## 2015-11-11 NOTE — Progress Notes (Signed)
BP 182/104 mmHg  Pulse 59  Temp(Src) 97.3 F (36.3 C)  Ht 5' 6.5" (1.689 m)  Wt 209 lb 12.8 oz (95.165 kg)  BMI 33.36 kg/m2  SpO2 98%   Subjective:    Patient ID: Timothy [name], male    DOB: 26-Mar-1956, 60 y.o.   MRN: [id number]  HPI: Timothy [name] is a 60 y.o. male presenting on 11/11/2015 for Hypertension   HPI   Chief Complaint  Patient presents with  . Hypertension   Pt is feeling well today  Relevant past medical, surgical, family and social history reviewed and updated as indicated. Interim medical history since our last visit reviewed. Allergies and medications reviewed and updated.  Current outpatient prescriptions:  .  aspirin EC 81 MG tablet, Take 81 mg by mouth daily. Reported on 10/29/2015, Disp: , Rfl:  .  lisinopril-hydrochlorothiazide (PRINZIDE,ZESTORETIC) 20-25 MG tablet, Take 1 tablet by mouth daily., Disp: 30 tablet, Rfl: 1 .  metoprolol (LOPRESSOR) 50 MG tablet, Take 1 tablet (50 mg total) by mouth 2 (two) times daily., Disp: 60 tablet, Rfl: 1   Review of Systems  Constitutional: Negative for fever, chills, diaphoresis, appetite change, fatigue and unexpected weight change.  HENT: Negative for congestion, dental problem, drooling, ear pain, facial swelling, hearing loss, mouth sores, sneezing, sore throat, trouble swallowing and voice change.   Eyes: Negative for pain, discharge, redness, itching and visual disturbance.  Respiratory: Negative for cough, choking, shortness of breath and wheezing.   Cardiovascular: Negative for chest pain, palpitations and leg swelling.  Gastrointestinal: Negative for vomiting, abdominal pain, diarrhea, constipation and blood in stool.  Endocrine: Negative for cold intolerance, heat intolerance and polydipsia.  Genitourinary: Negative for dysuria, hematuria and decreased urine volume.  Musculoskeletal: Negative for back pain, arthralgias and gait problem.  Skin: Negative for rash.  Allergic/Immunologic: Negative  for environmental allergies.  Neurological: Negative for seizures, syncope, light-headedness and headaches.  Hematological: Negative for adenopathy.  Psychiatric/Behavioral: Negative for suicidal ideas, dysphoric mood and agitation. The patient is not nervous/anxious.     Per HPI unless specifically indicated above     Objective:    BP 182/104 mmHg  Pulse 59  Temp(Src) 97.3 F (36.3 C)  Ht 5' 6.5" (1.689 m)  Wt 209 lb 12.8 oz (95.165 kg)  BMI 33.36 kg/m2  SpO2 98%  Wt Readings from Last 3 Encounters:  11/11/15 209 lb 12.8 oz (95.165 kg)  10/29/15 210 lb (95.255 kg)  08/15/13 225 lb (102.059 kg)    Physical Exam  Constitutional: He is oriented to person, place, and time. He appears well-developed and well-nourished.  HENT:  Head: Normocephalic and atraumatic.  Neck: Neck supple.  Cardiovascular: Normal rate and regular rhythm.   Pulmonary/Chest: Effort normal and breath sounds normal. He has no wheezes.  Abdominal: Soft. Bowel sounds are normal. There is no hepatosplenomegaly. There is no tenderness.  Musculoskeletal: He exhibits no edema.  Lymphadenopathy:    He has no cervical adenopathy.  Neurological: He is alert and oriented to person, place, and time.  Skin: Skin is warm and dry.  Psychiatric: He has a normal mood and affect. His behavior is normal.  Vitals reviewed.   Results for orders placed or performed in visit on 10/29/15  Lipid Profile  Result Value Ref Range   Cholesterol 208 (H) 125 - 200 mg/dL   Triglycerides 115 <150 mg/dL   HDL 57 >=40 mg/dL   Total CHOL/HDL Ratio 3.6 <=5.0 Ratio   VLDL 23 <30 mg/dL  LDL Cholesterol 128 <130 mg/dL  COMPLETE METABOLIC PANEL WITH GFR  Result Value Ref Range   Sodium 139 135 - 146 mmol/L   Potassium 4.6 3.5 - 5.3 mmol/L   Chloride 105 98 - 110 mmol/L   CO2 26 20 - 31 mmol/L   Glucose, Bld 131 (H) 65 - 99 mg/dL   BUN 14 7 - 25 mg/dL   Creat 1.20 0.70 - 1.25 mg/dL   Total Bilirubin 0.9 0.2 - 1.2 mg/dL    Alkaline Phosphatase 75 40 - 115 U/L   AST 20 10 - 35 U/L   ALT 13 9 - 46 U/L   Total Protein 6.5 6.1 - 8.1 g/dL   Albumin 4.0 3.6 - 5.1 g/dL   Calcium 9.0 8.6 - 10.3 mg/dL   GFR, Est African American 74 >=60 mL/min   GFR, Est Non African American 64 >=60 mL/min  HgB A1c  Result Value Ref Range   Hgb A1c MFr Bld 6.1 (H) <5.7 %   Mean Plasma Glucose 128 mg/dL  CBC w/Diff/Platelet  Result Value Ref Range   WBC 4.8 4.0 - 10.5 K/uL   RBC 6.02 (H) 4.22 - 5.81 MIL/uL   Hemoglobin 16.8 13.0 - 17.0 g/dL   HCT 49.2 39.0 - 52.0 %   MCV 81.7 78.0 - 100.0 fL   MCH 27.9 26.0 - 34.0 pg   MCHC 34.1 30.0 - 36.0 g/dL   RDW 14.5 11.5 - 15.5 %   Platelets 146 (L) 150 - 400 K/uL   MPV 9.9 8.6 - 12.4 fL   Neutrophils Relative % 48 43 - 77 %   Neutro Abs 2.3 1.7 - 7.7 K/uL   Lymphocytes Relative 34 12 - 46 %   Lymphs Abs 1.6 0.7 - 4.0 K/uL   Monocytes Relative 11 3 - 12 %   Monocytes Absolute 0.5 0.1 - 1.0 K/uL   Eosinophils Relative 6 (H) 0 - 5 %   Eosinophils Absolute 0.3 0.0 - 0.7 K/uL   Basophils Relative 1 0 - 1 %   Basophils Absolute 0.0 0.0 - 0.1 K/uL   Smear Review Criteria for review not met   PSA  Result Value Ref Range   PSA 1.18 <=4.00 ng/mL      Assessment & Plan:   Encounter Diagnoses  Name Primary?  . Essential hypertension, benign Yes  . Prediabetes   . Hyperlipidemia   . Obesity, unspecified     -reviewed labs with pt -gave pt reading information on prediabetes and counseled -rx lovastatin for lipids -increase metoprolol to 121m bid -F/u 1 mo to recheck bp

## 2015-11-11 NOTE — Patient Instructions (Signed)
Prediabetes Eating Plan Prediabetes--also called impaired glucose tolerance or impaired fasting glucose--is a condition that causes blood sugar (blood glucose) levels to be higher than normal. Following a healthy diet can help to keep prediabetes under control. It can also help to lower the risk of type 2 diabetes and heart disease, which are increased in people who have prediabetes. Along with regular exercise, a healthy diet:  Promotes weight loss.  Helps to control blood sugar levels.  Helps to improve the way that the body uses insulin. WHAT DO I NEED TO KNOW ABOUT THIS EATING PLAN?  Use the glycemic index (GI) to plan your meals. The index tells you how quickly a food will raise your blood sugar. Choose low-GI foods. These foods take a longer time to raise blood sugar.  Pay close attention to the amount of carbohydrates in the food that you eat. Carbohydrates increase blood sugar levels.  Keep track of how many calories you take in. Eating the right amount of calories will help you to achieve a healthy weight. Losing about 7 percent of your starting weight can help to prevent type 2 diabetes.  You may want to follow a Mediterranean diet. This diet includes a lot of vegetables, lean meats or fish, whole grains, fruits, and healthy oils and fats. WHAT FOODS CAN I EAT? Grains Whole grains, such as whole-wheat or whole-grain breads, crackers, cereals, and pasta. Unsweetened oatmeal. Bulgur. Barley. Quinoa. Brown rice. Corn or whole-wheat flour tortillas or taco shells. Vegetables Lettuce. Spinach. Peas. Beets. Cauliflower. Cabbage. Broccoli. Carrots. Tomatoes. Squash. Eggplant. Herbs. Peppers. Onions. Cucumbers. Brussels sprouts. Fruits Berries. Bananas. Apples. Oranges. Grapes. Papaya. Mango. Pomegranate. Kiwi. Grapefruit. Cherries. Meats and Other Protein Sources Seafood. Lean meats, such as chicken and turkey or lean cuts of pork and beef. Tofu. Eggs. Nuts. Beans. Dairy Low-fat or  fat-free dairy products, such as yogurt, cottage cheese, and cheese. Beverages Water. Tea. Coffee. Sugar-free or diet soda. Seltzer water. Milk. Milk alternatives, such as soy or almond milk. Condiments Mustard. Relish. Low-fat, low-sugar ketchup. Low-fat, low-sugar barbecue sauce. Low-fat or fat-free mayonnaise. Sweets and Desserts Sugar-free or low-fat pudding. Sugar-free or low-fat ice cream and other frozen treats. Fats and Oils Avocado. Walnuts. Olive oil. The items listed above may not be a complete list of recommended foods or beverages. Contact your dietitian for more options.  WHAT FOODS ARE NOT RECOMMENDED? Grains Refined white flour and flour products, such as bread, pasta, snack foods, and cereals. Beverages Sweetened drinks, such as sweet iced tea and soda. Sweets and Desserts Baked goods, such as cake, cupcakes, pastries, cookies, and cheesecake. The items listed above may not be a complete list of foods and beverages to avoid. Contact your dietitian for more information.   This information is not intended to replace advice given to you by your health care provider. Make sure you discuss any questions you have with your health care provider.   Document Released: 12/04/2014 Document Reviewed: 12/04/2014 Elsevier Interactive Patient Education 2016 [employer]  

## 2015-11-12 ENCOUNTER — Ambulatory Visit: Payer: Self-pay | Admitting: Physician Assistant

## 2015-12-11 ENCOUNTER — Encounter: Payer: Self-pay | Admitting: Physician Assistant

## 2015-12-11 ENCOUNTER — Ambulatory Visit: Payer: Self-pay | Admitting: Physician Assistant

## 2015-12-11 VITALS — BP 180/98 | HR 59 | Temp 97.7°F | Ht 66.5 in | Wt 210.5 lb

## 2015-12-11 DIAGNOSIS — Z1211 Encounter for screening for malignant neoplasm of colon: Secondary | ICD-10-CM

## 2015-12-11 DIAGNOSIS — I10 Essential (primary) hypertension: Secondary | ICD-10-CM

## 2015-12-11 DIAGNOSIS — N189 Chronic kidney disease, unspecified: Secondary | ICD-10-CM

## 2015-12-11 MED ORDER — AMLODIPINE BESYLATE 10 MG PO TABS: 10.0000 mg | ORAL_TABLET | Freq: Every day | ORAL | Status: DC

## 2015-12-11 MED ORDER — CLONIDINE HCL 0.1 MG PO TABS
0.1000 mg | ORAL_TABLET | Freq: Once | ORAL | Status: AC
  Administered 2015-12-11: 0.1 mg via ORAL

## 2015-12-11 NOTE — Progress Notes (Signed)
BP 212/98 mmHg  Pulse 59  Temp(Src) 97.7 F (36.5 C)  Ht 5' 6.5" (1.689 m)  Wt 210 lb 8 oz (95.482 kg)  BMI 33.47 kg/m2  SpO2 99%   Subjective:    Patient ID: Timothy [name], male    DOB: [birth year], 60 y.o.   MRN: [id number]  HPI: Timothy [name] is a 60 y.o. male presenting on 12/11/2015 for Hypertension   HPI  Relevant past medical, surgical, family and social history reviewed and updated as indicated. Interim medical history since our last visit reviewed. Allergies and medications reviewed and updated.   Current outpatient prescriptions:  .  aspirin EC 81 MG tablet, Take 81 mg by mouth daily. Reported on 10/29/2015, Disp: , Rfl:  .  lisinopril-hydrochlorothiazide (PRINZIDE,ZESTORETIC) 20-25 MG tablet, Take 1 tablet by mouth daily., Disp: 30 tablet, Rfl: 1 .  lovastatin (MEVACOR) 20 MG tablet, Take 1 tablet (20 mg total) by mouth at bedtime., Disp: 30 tablet, Rfl: 3 .  metoprolol (LOPRESSOR) 100 MG tablet, Take 1 tablet (100 mg total) by mouth 2 (two) times daily., Disp: 60 tablet, Rfl: 3   Review of Systems  Constitutional: Negative for fever, chills, diaphoresis, appetite change, fatigue and unexpected weight change.  HENT: Negative for congestion, dental problem, drooling, ear pain, facial swelling, hearing loss, mouth sores, sneezing, sore throat, trouble swallowing and voice change.   Eyes: Negative for pain, discharge, redness, itching and visual disturbance.  Respiratory: Negative for cough, choking, shortness of breath and wheezing.   Cardiovascular: Negative for chest pain, palpitations and leg swelling.  Gastrointestinal: Negative for vomiting, abdominal pain, diarrhea, constipation and blood in stool.  Endocrine: Negative for cold intolerance, heat intolerance and polydipsia.  Genitourinary: Negative for dysuria, hematuria and decreased urine volume.  Musculoskeletal: Negative for back pain, arthralgias and gait problem.  Skin: Negative for rash.   Allergic/Immunologic: Negative for environmental allergies.  Neurological: Negative for seizures, syncope, light-headedness and headaches.  Hematological: Negative for adenopathy.  Psychiatric/Behavioral: Negative for suicidal ideas, dysphoric mood and agitation. The patient is not nervous/anxious.     Per HPI unless specifically indicated above     Objective:    BP 212/98 mmHg  Pulse 59  Temp(Src) 97.7 F (36.5 C)  Ht 5' 6.5" (1.689 m)  Wt 210 lb 8 oz (95.482 kg)  BMI 33.47 kg/m2  SpO2 99%  Wt Readings from Last 3 Encounters:  12/11/15 210 lb 8 oz (95.482 kg)  11/11/15 209 lb 12.8 oz (95.165 kg)  10/29/15 210 lb (95.255 kg)    Physical Exam  Constitutional: He is oriented to person, place, and time. He appears well-developed and well-nourished.  HENT:  Head: Normocephalic and atraumatic.  Neck: Neck supple.  Cardiovascular: Normal rate and regular rhythm.   Pulmonary/Chest: Effort normal and breath sounds normal. He has no wheezes.  Abdominal: Soft. Bowel sounds are normal. There is no hepatosplenomegaly. There is no tenderness.  Musculoskeletal: He exhibits no edema.  Lymphadenopathy:    He has no cervical adenopathy.  Neurological: He is alert and oriented to person, place, and time.  Skin: Skin is warm and dry.  Psychiatric: He has a normal mood and affect. His behavior is normal.  Vitals reviewed.       Assessment & Plan:   Encounter Diagnoses  Name Primary?  . Essential hypertension Yes  . Special screening for malignant neoplasms, colon   . CKD (chronic kidney disease), unspecified stage     -Due to pt doubling hctz, will check  bmp today -Gave clonidine 0.1mg  at 9:07am.  bp recheck at 9:30 was 180/98. -rx amlodipine-  rx sent to [facility]. Also sent to [facility] so pt can start it today -Gave iFOBT for colon cancer screening -F/u 1 month to  recheck bp

## 2015-12-11 NOTE — Patient Instructions (Signed)
Start amlodipine 10mg  TODAY.  Rx was sent to [facility].  You have 10 days worth of medication at [facility].  You should receive supply in the mail from [facility] in 7-10 days.

## 2015-12-12 LAB — BASIC METABOLIC PANEL
BUN: 17 mg/dL (ref 7–25)
CHLORIDE: 101 mmol/L (ref 98–110)
CO2: 30 mmol/L (ref 20–31)
CREATININE: 1.44 mg/dL — AB (ref 0.70–1.25)
Calcium: 9.5 mg/dL (ref 8.6–10.3)
Glucose, Bld: 129 mg/dL — ABNORMAL HIGH (ref 65–99)
Potassium: 4.9 mmol/L (ref 3.5–5.3)
Sodium: 138 mmol/L (ref 135–146)

## 2015-12-31 LAB — IFOBT (OCCULT BLOOD): IMMUNOLOGICAL FECAL OCCULT BLOOD TEST: NEGATIVE

## 2016-01-09 ENCOUNTER — Ambulatory Visit: Payer: Self-pay | Admitting: Physician Assistant

## 2016-01-09 ENCOUNTER — Encounter: Payer: Self-pay | Admitting: Physician Assistant

## 2016-01-09 VITALS — BP 162/88 | HR 82 | Temp 97.9°F | Ht 66.5 in | Wt 208.8 lb

## 2016-01-09 DIAGNOSIS — I10 Essential (primary) hypertension: Secondary | ICD-10-CM

## 2016-01-09 MED ORDER — LOVASTATIN 20 MG PO TABS: 20.0000 mg | ORAL_TABLET | Freq: Every day | ORAL | Status: DC

## 2016-01-09 MED ORDER — METOPROLOL TARTRATE 100 MG PO TABS: 100.0000 mg | ORAL_TABLET | Freq: Two times a day (BID) | ORAL | Status: DC

## 2016-01-09 NOTE — Progress Notes (Signed)
BP 162/88 mmHg  Pulse 82  Temp(Src) 97.9 F (36.6 C)  Ht 5' 6.5" (1.689 m)  Wt 208 lb 12.8 oz (94.711 kg)  BMI 33.20 kg/m2  SpO2 98%   Subjective:    Patient ID: Timothy [name], male    DOB: 1956-03-17, 60 y.o.   MRN: [id number]  HPI: Timothy [name] is a 60 y.o. male presenting on 01/09/2016 for Hypertension   HPI    Pt ran out of his metoprolol and lovastain last week  Relevant past medical, surgical, family and social history reviewed and updated as indicated. Interim medical history since our last visit reviewed. Allergies and medications reviewed and updated.   Current outpatient prescriptions:  .  amLODipine (NORVASC) 10 MG tablet, Take 1 tablet (10 mg total) by mouth daily., Disp: 90 tablet, Rfl: 1 .  aspirin EC 81 MG tablet, Take 81 mg by mouth daily. Reported on 10/29/2015, Disp: , Rfl:  .  lisinopril-hydrochlorothiazide (PRINZIDE,ZESTORETIC) 20-25 MG tablet, Take 1 tablet by mouth daily., Disp: 30 tablet, Rfl: 1 .  amLODipine (NORVASC) 10 MG tablet, Take 1 tablet (10 mg total) by mouth daily., Disp: 10 tablet, Rfl: 0 .  lovastatin (MEVACOR) 20 MG tablet, Take 1 tablet (20 mg total) by mouth at bedtime., Disp: 30 tablet, Rfl: 3 .  metoprolol (LOPRESSOR) 100 MG tablet, Take 1 tablet (100 mg total) by mouth 2 (two) times daily., Disp: 60 tablet, Rfl: 3   Review of Systems  Constitutional: Negative for fever, chills, diaphoresis, appetite change, fatigue and unexpected weight change.  HENT: Negative for congestion, dental problem, drooling, ear pain, facial swelling, hearing loss, mouth sores, sneezing, sore throat, trouble swallowing and voice change.   Eyes: Negative for pain, discharge, redness, itching and visual disturbance.  Respiratory: Negative for cough, choking, shortness of breath and wheezing.   Cardiovascular: Negative for chest pain, palpitations and leg swelling.  Gastrointestinal: Negative for vomiting, abdominal pain, diarrhea, constipation and blood  in stool.  Endocrine: Negative for cold intolerance, heat intolerance and polydipsia.  Genitourinary: Negative for dysuria, hematuria and decreased urine volume.  Musculoskeletal: Negative for back pain, arthralgias and gait problem.  Skin: Negative for rash.  Allergic/Immunologic: Negative for environmental allergies.  Neurological: Negative for seizures, syncope, light-headedness and headaches.  Hematological: Negative for adenopathy.  Psychiatric/Behavioral: Negative for suicidal ideas, dysphoric mood and agitation. The patient is not nervous/anxious.     Per HPI unless specifically indicated above     Objective:    BP 162/88 mmHg  Pulse 82  Temp(Src) 97.9 F (36.6 C)  Ht 5' 6.5" (1.689 m)  Wt 208 lb 12.8 oz (94.711 kg)  BMI 33.20 kg/m2  SpO2 98%  Wt Readings from Last 3 Encounters:  01/09/16 208 lb 12.8 oz (94.711 kg)  12/11/15 210 lb 8 oz (95.482 kg)  11/11/15 209 lb 12.8 oz (95.165 kg)    Physical Exam  Constitutional: He is oriented to person, place, and time. He appears well-developed and well-nourished.  HENT:  Head: Normocephalic and atraumatic.  Neck: Neck supple.  Cardiovascular: Normal rate and regular rhythm.   Pulmonary/Chest: Effort normal and breath sounds normal. He has no wheezes.  Abdominal: Soft. Bowel sounds are normal. There is no hepatosplenomegaly. There is no tenderness.  Musculoskeletal: He exhibits no edema.  Lymphadenopathy:    He has no cervical adenopathy.  Neurological: He is alert and oriented to person, place, and time.  Skin: Skin is warm and dry.  Psychiatric: He has a normal mood and  affect. His behavior is normal.  Vitals reviewed.       Assessment & Plan:    Encounter Diagnosis  Name Primary?  . Essential hypertension, benign Yes    Pt counseled to get back on his medication and f/u 10month.  Discussed with him the importance of him being on his medication due to his significantly elevated bp and the risks that it brings.   He states understanding

## 2016-02-05 ENCOUNTER — Ambulatory Visit: Payer: Self-pay | Admitting: Physician Assistant

## 2016-02-05 ENCOUNTER — Encounter: Payer: Self-pay | Admitting: Physician Assistant

## 2016-02-05 VITALS — BP 170/90 | HR 65 | Temp 97.9°F | Ht 66.5 in | Wt 209.6 lb

## 2016-02-05 DIAGNOSIS — I10 Essential (primary) hypertension: Secondary | ICD-10-CM

## 2016-02-05 MED ORDER — LISINOPRIL-HYDROCHLOROTHIAZIDE 20-12.5 MG PO TABS: 2.0000 | ORAL_TABLET | Freq: Every day | ORAL | Status: DC

## 2016-02-05 NOTE — Progress Notes (Signed)
BP 170/90 mmHg  Pulse 65  Temp(Src) 97.9 F (36.6 C)  Ht 5' 6.5" (1.689 m)  Wt 209 lb 9.6 oz (95.074 kg)  BMI 33.33 kg/m2  SpO2 98%   Subjective:    Patient ID: Timothy [name], male    DOB: [birth year], 60 y.o.   MRN: [id number]  HPI: Timothy [name] is a 60 y.o. male presenting on 02/05/2016 for Hypertension   HPI Pt is feeling well today.  He says he is taking his medications as directed  Relevant past medical, surgical, family and social history reviewed and updated as indicated. Interim medical history since our last visit reviewed. Allergies and medications reviewed and updated.   Current outpatient prescriptions:  .  amLODipine (NORVASC) 10 MG tablet, Take 1 tablet (10 mg total) by mouth daily., Disp: 90 tablet, Rfl: 1 .  aspirin EC 81 MG tablet, Take 81 mg by mouth daily. Reported on 10/29/2015, Disp: , Rfl:  .  lisinopril-hydrochlorothiazide (PRINZIDE,ZESTORETIC) 20-25 MG tablet, Take 1 tablet by mouth daily., Disp: 30 tablet, Rfl: 1 .  lovastatin (MEVACOR) 20 MG tablet, Take 1 tablet (20 mg total) by mouth at bedtime., Disp: 30 tablet, Rfl: 3 .  metoprolol (LOPRESSOR) 100 MG tablet, Take 1 tablet (100 mg total) by mouth 2 (two) times daily., Disp: 60 tablet, Rfl: 3   Review of Systems  Constitutional: Negative for fever, chills, diaphoresis, appetite change, fatigue and unexpected weight change.  HENT: Negative for congestion, dental problem, drooling, ear pain, facial swelling, hearing loss, mouth sores, sneezing, sore throat, trouble swallowing and voice change.   Eyes: Negative for pain, discharge, redness, itching and visual disturbance.  Respiratory: Negative for cough, choking, shortness of breath and wheezing.   Cardiovascular: Negative for chest pain, palpitations and leg swelling.  Gastrointestinal: Negative for vomiting, abdominal pain, diarrhea, constipation and blood in stool.  Endocrine: Negative for cold intolerance, heat intolerance and polydipsia.   Genitourinary: Negative for dysuria, hematuria and decreased urine volume.  Musculoskeletal: Negative for back pain, arthralgias and gait problem.  Skin: Negative for rash.  Allergic/Immunologic: Negative for environmental allergies.  Neurological: Negative for seizures, syncope, light-headedness and headaches.  Hematological: Negative for adenopathy.  Psychiatric/Behavioral: Negative for suicidal ideas, dysphoric mood and agitation. The patient is not nervous/anxious.     Per HPI unless specifically indicated above     Objective:    BP 170/90 mmHg  Pulse 65  Temp(Src) 97.9 F (36.6 C)  Ht 5' 6.5" (1.689 m)  Wt 209 lb 9.6 oz (95.074 kg)  BMI 33.33 kg/m2  SpO2 98%  Wt Readings from Last 3 Encounters:  02/05/16 209 lb 9.6 oz (95.074 kg)  01/09/16 208 lb 12.8 oz (94.711 kg)  12/11/15 210 lb 8 oz (95.482 kg)    Physical Exam  Constitutional: He is oriented to person, place, and time. He appears well-developed and well-nourished.  HENT:  Head: Normocephalic and atraumatic.  Neck: Neck supple.  Cardiovascular: Normal rate and regular rhythm.   Pulmonary/Chest: Effort normal and breath sounds normal. He has no wheezes.  Abdominal: Soft. Bowel sounds are normal. There is no hepatosplenomegaly. There is no tenderness.  Musculoskeletal: He exhibits no edema.  Lymphadenopathy:    He has no cervical adenopathy.  Neurological: He is alert and oriented to person, place, and time.  Skin: Skin is warm and dry.  Psychiatric: He has a normal mood and affect. His behavior is normal.  Vitals reviewed.       Assessment & Plan:  Encounter Diagnosis  Name Primary?  . Essential hypertension, benign Yes    -D/c lisinopril/hctz 20/25 and start lisinopril/hctz 20/12.5 and take 2 qd -F/u 21month

## 2016-02-27 ENCOUNTER — Other Ambulatory Visit: Payer: Self-pay | Admitting: Student

## 2016-02-27 DIAGNOSIS — I10 Essential (primary) hypertension: Secondary | ICD-10-CM

## 2016-03-03 ENCOUNTER — Ambulatory Visit: Payer: Self-pay | Admitting: Physician Assistant

## 2016-03-03 ENCOUNTER — Encounter: Payer: Self-pay | Admitting: Physician Assistant

## 2016-03-03 VITALS — BP 130/80 | HR 55 | Temp 98.1°F | Ht 66.5 in | Wt 205.4 lb

## 2016-03-03 DIAGNOSIS — I10 Essential (primary) hypertension: Secondary | ICD-10-CM

## 2016-03-03 DIAGNOSIS — E785 Hyperlipidemia, unspecified: Secondary | ICD-10-CM

## 2016-03-03 DIAGNOSIS — E669 Obesity, unspecified: Secondary | ICD-10-CM

## 2016-03-03 DIAGNOSIS — N189 Chronic kidney disease, unspecified: Secondary | ICD-10-CM

## 2016-03-03 MED ORDER — LOVASTATIN 20 MG PO TABS: 20.0000 mg | ORAL_TABLET | Freq: Every day | ORAL | 5 refills | Status: DC

## 2016-03-03 NOTE — Progress Notes (Signed)
BP 130/80 (BP Location: Left Arm, Patient Position: Sitting, Cuff Size: Normal)   Pulse (!) 55   Temp 98.1 F (36.7 C)   Ht 5' 6.5" (1.689 m)   Wt 205 lb 6.4 oz (93.2 kg)   SpO2 98%   BMI 32.66 kg/m    Subjective:    Patient ID: Timothy [name], male    DOB: 1956/02/28, 60 y.o.   MRN: [id number]  HPI: Timothy [name] is a 60 y.o. male presenting on 03/03/2016 for Hypertension   HPI Pt is feeling "great" today.  He says he ran out of his lovastatin last week.  Relevant past medical, surgical, family and social history reviewed and updated as indicated. Interim medical history since our last visit reviewed. Allergies and medications reviewed and updated.   Current Outpatient Prescriptions:  .  amLODipine (NORVASC) 10 MG tablet, Take 1 tablet (10 mg total) by mouth daily., Disp: 90 tablet, Rfl: 1 .  aspirin EC 81 MG tablet, Take 81 mg by mouth daily. Reported on 10/29/2015, Disp: , Rfl:  .  lisinopril-hydrochlorothiazide (ZESTORETIC) 20-12.5 MG tablet, Take 2 tablets by mouth daily., Disp: 180 tablet, Rfl: 1 .  metoprolol (LOPRESSOR) 100 MG tablet, Take 1 tablet (100 mg total) by mouth 2 (two) times daily., Disp: 60 tablet, Rfl: 3 .  lovastatin (MEVACOR) 20 MG tablet, Take 1 tablet (20 mg total) by mouth at bedtime. (Patient not taking: Reported on 03/03/2016), Disp: 30 tablet, Rfl: 3   Review of Systems  Constitutional: Negative for appetite change, chills, diaphoresis, fatigue, fever and unexpected weight change.  HENT: Negative for congestion, drooling, ear pain, facial swelling, hearing loss, mouth sores, sneezing, sore throat, trouble swallowing and voice change.   Eyes: Negative for pain, discharge, redness, itching and visual disturbance.  Respiratory: Negative for cough, choking, shortness of breath and wheezing.   Cardiovascular: Negative for chest pain, palpitations and leg swelling.  Gastrointestinal: Negative for abdominal pain, blood in stool, constipation, diarrhea  and vomiting.  Endocrine: Negative for cold intolerance, heat intolerance and polydipsia.  Genitourinary: Negative for decreased urine volume, dysuria and hematuria.  Musculoskeletal: Negative for arthralgias, back pain and gait problem.  Skin: Negative for rash.  Allergic/Immunologic: Negative for environmental allergies.  Neurological: Negative for seizures, syncope, light-headedness and headaches.  Hematological: Negative for adenopathy.  Psychiatric/Behavioral: Negative for agitation, dysphoric mood and suicidal ideas. The patient is not nervous/anxious.     Per HPI unless specifically indicated above     Objective:    BP 130/80 (BP Location: Left Arm, Patient Position: Sitting, Cuff Size: Normal)   Pulse (!) 55   Temp 98.1 F (36.7 C)   Ht 5' 6.5" (1.689 m)   Wt 205 lb 6.4 oz (93.2 kg)   SpO2 98%   BMI 32.66 kg/m   Wt Readings from Last 3 Encounters:  03/03/16 205 lb 6.4 oz (93.2 kg)  02/05/16 209 lb 9.6 oz (95.1 kg)  01/09/16 208 lb 12.8 oz (94.7 kg)    Physical Exam  Constitutional: He is oriented to person, place, and time. He appears well-developed and well-nourished.  HENT:  Head: Normocephalic and atraumatic.  Neck: Neck supple.  Cardiovascular: Normal rate and regular rhythm.   Pulmonary/Chest: Effort normal and breath sounds normal. He has no wheezes.  Abdominal: Soft. Bowel sounds are normal. There is no hepatosplenomegaly. There is no tenderness.  Musculoskeletal: He exhibits no edema.  Lymphadenopathy:    He has no cervical adenopathy.  Neurological: He is alert and oriented  to person, place, and time.  Skin: Skin is warm and dry.  Psychiatric: He has a normal mood and affect. His behavior is normal.  Vitals reviewed.       Assessment & Plan:    Encounter Diagnoses  Name Primary?  . Essential hypertension, benign Yes  . Hyperlipidemia   . CKD (chronic kidney disease), unspecified stage   . Obesity, unspecified     -Check fasting lipids  tomorrow morning. Will call with results -Continue current BP meds -Get back on lovastatin -F/u 3 months.  RTO sooner prn

## 2016-03-04 LAB — COMPLETE METABOLIC PANEL WITH GFR
ALK PHOS: 57 U/L (ref 40–115)
ALT: 10 U/L (ref 9–46)
AST: 13 U/L (ref 10–35)
Albumin: 3.9 g/dL (ref 3.6–5.1)
BUN: 31 mg/dL — AB (ref 7–25)
CALCIUM: 8.7 mg/dL (ref 8.6–10.3)
CHLORIDE: 105 mmol/L (ref 98–110)
CO2: 25 mmol/L (ref 20–31)
Creat: 1.79 mg/dL — ABNORMAL HIGH (ref 0.70–1.25)
GFR, EST NON AFRICAN AMERICAN: 40 mL/min — AB (ref >=60)
GFR, Est African American: 46 mL/min — ABNORMAL LOW (ref >=60)
Glucose, Bld: 120 mg/dL — ABNORMAL HIGH (ref 65–99)
POTASSIUM: 4.1 mmol/L (ref 3.5–5.3)
Sodium: 139 mmol/L (ref 135–146)
Total Bilirubin: 0.7 mg/dL (ref 0.2–1.2)
Total Protein: 6.5 g/dL (ref 6.1–8.1)

## 2016-03-04 LAB — LIPID PANEL
CHOL/HDL RATIO: 4.2 ratio (ref <=5.0)
CHOLESTEROL: 166 mg/dL (ref 125–200)
HDL: 40 mg/dL (ref >=40)
LDL Cholesterol: 94 mg/dL (ref <130)
Triglycerides: 158 mg/dL — ABNORMAL HIGH (ref <150)
VLDL: 32 mg/dL — AB (ref <30)

## 2016-03-05 ENCOUNTER — Other Ambulatory Visit: Payer: Self-pay | Admitting: Physician Assistant

## 2016-03-05 DIAGNOSIS — I10 Essential (primary) hypertension: Secondary | ICD-10-CM

## 2016-03-05 DIAGNOSIS — N189 Chronic kidney disease, unspecified: Secondary | ICD-10-CM

## 2016-03-31 ENCOUNTER — Other Ambulatory Visit: Payer: Self-pay

## 2016-03-31 DIAGNOSIS — N189 Chronic kidney disease, unspecified: Secondary | ICD-10-CM

## 2016-03-31 DIAGNOSIS — I10 Essential (primary) hypertension: Secondary | ICD-10-CM

## 2016-04-01 LAB — BUN: BUN: 25 mg/dL (ref 7–25)

## 2016-04-01 LAB — CREATININE, SERUM: CREATININE: 1.73 mg/dL — AB (ref 0.70–1.25)

## 2016-04-02 ENCOUNTER — Other Ambulatory Visit: Payer: Self-pay | Admitting: Physician Assistant

## 2016-04-02 DIAGNOSIS — N189 Chronic kidney disease, unspecified: Secondary | ICD-10-CM

## 2016-04-02 DIAGNOSIS — I10 Essential (primary) hypertension: Secondary | ICD-10-CM

## 2016-06-01 ENCOUNTER — Other Ambulatory Visit: Payer: Self-pay | Admitting: Student

## 2016-06-01 DIAGNOSIS — N189 Chronic kidney disease, unspecified: Secondary | ICD-10-CM

## 2016-06-01 DIAGNOSIS — I10 Essential (primary) hypertension: Secondary | ICD-10-CM

## 2016-06-01 LAB — BASIC METABOLIC PANEL
BUN: 22 mg/dL (ref 7–25)
CHLORIDE: 103 mmol/L (ref 98–110)
CO2: 28 mmol/L (ref 20–31)
CREATININE: 1.51 mg/dL — AB (ref 0.70–1.25)
Calcium: 9.2 mg/dL (ref 8.6–10.3)
Glucose, Bld: 129 mg/dL — ABNORMAL HIGH (ref 65–99)
POTASSIUM: 4 mmol/L (ref 3.5–5.3)
Sodium: 139 mmol/L (ref 135–146)

## 2016-06-03 ENCOUNTER — Ambulatory Visit: Payer: Self-pay | Admitting: Physician Assistant

## 2016-06-09 ENCOUNTER — Encounter: Payer: Self-pay | Admitting: Physician Assistant

## 2016-06-09 ENCOUNTER — Ambulatory Visit: Payer: Self-pay | Admitting: Physician Assistant

## 2016-06-09 VITALS — BP 188/108 | HR 67 | Temp 97.5°F | Ht 66.5 in | Wt 204.0 lb

## 2016-06-09 DIAGNOSIS — Z6832 Body mass index (BMI) 32.0-32.9, adult: Secondary | ICD-10-CM

## 2016-06-09 DIAGNOSIS — E66811 Obesity, class 1: Secondary | ICD-10-CM

## 2016-06-09 DIAGNOSIS — E669 Obesity, unspecified: Secondary | ICD-10-CM

## 2016-06-09 DIAGNOSIS — E785 Hyperlipidemia, unspecified: Secondary | ICD-10-CM

## 2016-06-09 DIAGNOSIS — I10 Essential (primary) hypertension: Secondary | ICD-10-CM

## 2016-06-09 DIAGNOSIS — Z9119 Patient's noncompliance with other medical treatment and regimen: Secondary | ICD-10-CM

## 2016-06-09 DIAGNOSIS — Z91199 Patient's noncompliance with other medical treatment and regimen due to unspecified reason: Secondary | ICD-10-CM

## 2016-06-09 DIAGNOSIS — N189 Chronic kidney disease, unspecified: Secondary | ICD-10-CM

## 2016-06-09 MED ORDER — AMLODIPINE BESYLATE 10 MG PO TABS: 10.0000 mg | ORAL_TABLET | Freq: Every day | ORAL | 1 refills | Status: DC

## 2016-06-09 MED ORDER — CLONIDINE HCL 0.1 MG PO TABS
0.1000 mg | ORAL_TABLET | Freq: Once | ORAL | Status: AC
  Administered 2016-06-09: 0.1 mg via ORAL

## 2016-06-09 MED ORDER — LOVASTATIN 20 MG PO TABS: 20.0000 mg | ORAL_TABLET | Freq: Every day | ORAL | 1 refills | Status: DC

## 2016-06-09 MED ORDER — LISINOPRIL-HYDROCHLOROTHIAZIDE 20-12.5 MG PO TABS: 2.0000 | ORAL_TABLET | Freq: Every day | ORAL | 1 refills | Status: DC

## 2016-06-09 MED ORDER — METOPROLOL TARTRATE 100 MG PO TABS: 100.0000 mg | ORAL_TABLET | Freq: Two times a day (BID) | ORAL | 3 refills | Status: DC

## 2016-06-09 NOTE — Progress Notes (Signed)
BP (!) 210/112 (BP Location: Left Arm, Patient Position: Sitting, Cuff Size: Normal)   Pulse 67   Temp 97.5 F (36.4 C) (Other (Comment))   Ht 5' 6.5" (1.689 m)   Wt 204 lb (92.5 kg)   SpO2 98%   BMI 32.43 kg/m    Subjective:    Patient ID: Timothy [name], male    DOB: 12-27-1955, 60 y.o.   MRN: [id number]  HPI: Timothy [name] is a 60 y.o. male presenting on 06/09/2016 for Hypertension   HPI   Pt says he got his blood drawn yesterday.  Pt ran out of all of his meds a week ago and just decided to wait until he came in to see me before he got them refilled.  He feels well today and denies HA, vision changes, CP, sob.  Relevant past medical, surgical, family and social history reviewed and updated as indicated. Interim medical history since our last visit reviewed. Allergies and medications reviewed and updated.   Current Outpatient Prescriptions:  .  aspirin EC 81 MG tablet, Take 81 mg by mouth daily. Reported on 10/29/2015, Disp: , Rfl:  .  amLODipine (NORVASC) 10 MG tablet, Take 1 tablet (10 mg total) by mouth daily. (Patient not taking: Reported on 06/09/2016), Disp: 90 tablet, Rfl: 1 .  lisinopril-hydrochlorothiazide (ZESTORETIC) 20-12.5 MG tablet, Take 2 tablets by mouth daily. (Patient not taking: Reported on 06/09/2016), Disp: 180 tablet, Rfl: 1 .  lovastatin (MEVACOR) 20 MG tablet, Take 1 tablet (20 mg total) by mouth at bedtime. (Patient not taking: Reported on 06/09/2016), Disp: 30 tablet, Rfl: 5 .  metoprolol (LOPRESSOR) 100 MG tablet, Take 1 tablet (100 mg total) by mouth 2 (two) times daily. (Patient not taking: Reported on 06/09/2016), Disp: 60 tablet, Rfl: 3   Review of Systems  Constitutional: Negative for appetite change, chills, diaphoresis, fatigue, fever and unexpected weight change.  HENT: Negative for congestion, dental problem, drooling, ear pain, facial swelling, hearing loss, mouth sores, sneezing, sore throat, trouble swallowing and voice change.    Eyes: Negative for pain, discharge, redness, itching and visual disturbance.  Respiratory: Negative for cough, choking, shortness of breath and wheezing.   Cardiovascular: Negative for chest pain, palpitations and leg swelling.  Gastrointestinal: Negative for abdominal pain, blood in stool, constipation, diarrhea and vomiting.  Endocrine: Negative for cold intolerance, heat intolerance and polydipsia.  Genitourinary: Negative for decreased urine volume, dysuria and hematuria.  Musculoskeletal: Negative for arthralgias, back pain and gait problem.  Skin: Negative for rash.  Allergic/Immunologic: Negative for environmental allergies.  Neurological: Negative for seizures, syncope, light-headedness and headaches.  Hematological: Negative for adenopathy.  Psychiatric/Behavioral: Negative for agitation, dysphoric mood and suicidal ideas. The patient is not nervous/anxious.     Per HPI unless specifically indicated above     Objective:    BP (!) 210/112 (BP Location: Left Arm, Patient Position: Sitting, Cuff Size: Normal)   Pulse 67   Temp 97.5 F (36.4 C) (Other (Comment))   Ht 5' 6.5" (1.689 m)   Wt 204 lb (92.5 kg)   SpO2 98%   BMI 32.43 kg/m   Wt Readings from Last 3 Encounters:  06/09/16 204 lb (92.5 kg)  03/03/16 205 lb 6.4 oz (93.2 kg)  02/05/16 209 lb 9.6 oz (95.1 kg)    Physical Exam  Constitutional: He is oriented to person, place, and time. He appears well-developed and well-nourished.  HENT:  Head: Normocephalic and atraumatic.  Neck: Neck supple.  Cardiovascular: Normal rate and  regular rhythm.   Pulmonary/Chest: Effort normal and breath sounds normal. He has no wheezes.  Abdominal: Soft. Bowel sounds are normal. There is no hepatosplenomegaly. There is no tenderness.  Musculoskeletal: He exhibits no edema.  Lymphadenopathy:    He has no cervical adenopathy.  Neurological: He is alert and oriented to person, place, and time.  Skin: Skin is warm and dry.   Psychiatric: He has a normal mood and affect. His behavior is normal.  Vitals reviewed.       Assessment & Plan:   Encounter Diagnoses  Name Primary?  . Essential hypertension, benign Yes  . Personal history of noncompliance with medical treatment, presenting hazards to health   . Chronic kidney disease, unspecified CKD stage   . Hyperlipidemia, unspecified hyperlipidemia type   . Class 1 obesity with body mass index (BMI) of 32.0 to 32.9 in adult, unspecified obesity type, unspecified whether serious comorbidity present      -pt was given clonidine in office today and BP improved.  He is counseled at length to avoid running out of his bp meds.  As high as his bp gets, he is as significant risk for CVA or MI.  Pt states understanding and says he will avoid running out of his meds in the future -Pt to pick up meds from [place] today -f/u 1 month to recheck the blood pressure

## 2016-07-08 ENCOUNTER — Encounter: Payer: Self-pay | Admitting: Physician Assistant

## 2016-07-08 ENCOUNTER — Ambulatory Visit: Payer: Self-pay | Admitting: Physician Assistant

## 2016-07-08 VITALS — BP 164/86 | HR 58 | Temp 97.9°F | Ht 66.0 in | Wt 200.6 lb

## 2016-07-08 DIAGNOSIS — E785 Hyperlipidemia, unspecified: Secondary | ICD-10-CM

## 2016-07-08 DIAGNOSIS — E669 Obesity, unspecified: Secondary | ICD-10-CM

## 2016-07-08 DIAGNOSIS — R7303 Prediabetes: Secondary | ICD-10-CM

## 2016-07-08 DIAGNOSIS — I10 Essential (primary) hypertension: Secondary | ICD-10-CM

## 2016-07-08 DIAGNOSIS — Z6832 Body mass index (BMI) 32.0-32.9, adult: Secondary | ICD-10-CM

## 2016-07-08 DIAGNOSIS — N189 Chronic kidney disease, unspecified: Secondary | ICD-10-CM

## 2016-07-08 MED ORDER — CLONIDINE HCL 0.1 MG PO TABS: 0.1000 mg | ORAL_TABLET | Freq: Two times a day (BID) | ORAL | 2 refills | Status: DC

## 2016-07-08 NOTE — Patient Instructions (Signed)
DASH Eating Plan DASH stands for "Dietary Approaches to Stop Hypertension." The DASH eating plan is a healthy eating plan that has been shown to reduce high blood pressure (hypertension). Additional health benefits may include reducing the risk of type 2 diabetes mellitus, heart disease, and stroke. The DASH eating plan may also help with weight loss. What do I need to know about the DASH eating plan? For the DASH eating plan, you will follow these general guidelines:  Choose foods with less than 150 milligrams of sodium per serving (as listed on the food label).  Use salt-free seasonings or herbs instead of table salt or sea salt.  Check with your health care provider or pharmacist before using salt substitutes.  Eat lower-sodium products. These are often labeled as "low-sodium" or "no salt added."  Eat fresh foods. Avoid eating a lot of canned foods.  Eat more vegetables, fruits, and low-fat dairy products.  Choose whole grains. Look for the word "whole" as the first word in the ingredient list.  Choose fish and skinless chicken or turkey more often than red meat. Limit fish, poultry, and meat to 6 oz (170 g) each day.  Limit sweets, desserts, sugars, and sugary drinks.  Choose heart-healthy fats.  Eat more home-cooked food and less restaurant, buffet, and fast food.  Limit fried foods.  Do not fry foods. Cook foods using methods such as baking, boiling, grilling, and broiling instead.  When eating at a restaurant, ask that your food be prepared with less salt, or no salt if possible. What foods can I eat? Seek help from a dietitian for individual calorie needs. Grains  Whole grain or whole wheat bread. Brown rice. Whole grain or whole wheat pasta. Quinoa, bulgur, and whole grain cereals. Low-sodium cereals. Corn or whole wheat flour tortillas. Whole grain cornbread. Whole grain crackers. Low-sodium crackers. Vegetables  Fresh or frozen vegetables (raw, steamed, roasted, or  grilled). Low-sodium or reduced-sodium tomato and vegetable juices. Low-sodium or reduced-sodium tomato sauce and paste. Low-sodium or reduced-sodium canned vegetables. Fruits  All fresh, canned (in natural juice), or frozen fruits. Meat and Other Protein Products  Ground beef (85% or leaner), grass-fed beef, or beef trimmed of fat. Skinless chicken or turkey. Ground chicken or turkey. Pork trimmed of fat. All fish and seafood. Eggs. Dried beans, peas, or lentils. Unsalted nuts and seeds. Unsalted canned beans. Dairy  Low-fat dairy products, such as skim or 1% milk, 2% or reduced-fat cheeses, low-fat ricotta or cottage cheese, or plain low-fat yogurt. Low-sodium or reduced-sodium cheeses. Fats and Oils  Tub margarines without trans fats. Light or reduced-fat mayonnaise and salad dressings (reduced sodium). Avocado. Safflower, olive, or canola oils. Natural peanut or almond butter. Other  Unsalted popcorn and pretzels. The items listed above may not be a complete list of recommended foods or beverages. Contact your dietitian for more options.  What foods are not recommended? Grains  White bread. White pasta. White rice. Refined cornbread. Bagels and croissants. Crackers that contain trans fat. Vegetables  Creamed or fried vegetables. Vegetables in a cheese sauce. Regular canned vegetables. Regular canned tomato sauce and paste. Regular tomato and vegetable juices. Fruits  Canned fruit in light or heavy syrup. Fruit juice. Meat and Other Protein Products  Fatty cuts of meat. Ribs, chicken wings, bacon, sausage, bologna, salami, chitterlings, fatback, hot dogs, bratwurst, and packaged luncheon meats. Salted nuts and seeds. Canned beans with salt. Dairy  Whole or 2% milk, cream, half-and-half, and cream cheese. Whole-fat or sweetened yogurt. Full-fat cheeses   or blue cheese. Nondairy creamers and whipped toppings. Processed cheese, cheese spreads, or cheese curds. Condiments  Onion and garlic  salt, seasoned salt, table salt, and sea salt. Canned and packaged gravies. Worcestershire sauce. Tartar sauce. Barbecue sauce. Teriyaki sauce. Soy sauce, including reduced sodium. Steak sauce. Fish sauce. Oyster sauce. Cocktail sauce. Horseradish. Ketchup and mustard. Meat flavorings and tenderizers. Bouillon cubes. Hot sauce. Tabasco sauce. Marinades. Taco seasonings. Relishes. Fats and Oils  Butter, stick margarine, lard, shortening, ghee, and bacon fat. Coconut, palm kernel, or palm oils. Regular salad dressings. Other  Pickles and olives. Salted popcorn and pretzels. The items listed above may not be a complete list of foods and beverages to avoid. Contact your dietitian for more information.  Where can I find more information? National [employer], [employer], [employer]: www.nhlbi.nih.gov/health/health-topics/topics/dash/ This information is not intended to replace advice given to you by your health care provider. Make sure you discuss any questions you have with your health care provider. Document Released: 07/09/2011 Document Revised: 12/26/2015 Document Reviewed: 05/24/2013 Elsevier Interactive Patient Education  2017 [employer] Inc[employer]  

## 2016-07-08 NOTE — Progress Notes (Signed)
BP (!) 164/86 (BP Location: Left Arm, Patient Position: Sitting, Cuff Size: Normal)   Pulse (!) 58   Temp 97.9 F (36.6 C) (Other (Comment))   Ht 5\' 6"  (1.676 m)   Wt 200 lb 9.6 oz (91 kg)   SpO2 98%   BMI 32.38 kg/m    Subjective:    Patient ID: Timothy [name], male    DOB: June 17, 1956, 60 y.o.   MRN: [id number]  HPI: Timothy [name] is a 60 y.o. male presenting on 07/08/2016 for Hypertension   HPI   Pt feeling well today  Relevant past medical, surgical, family and social history reviewed and updated as indicated. Interim medical history since our last visit reviewed. Allergies and medications reviewed and updated.   Current Outpatient Prescriptions:  .  amLODipine (NORVASC) 10 MG tablet, Take 1 tablet (10 mg total) by mouth daily., Disp: 30 tablet, Rfl: 1 .  aspirin EC 81 MG tablet, Take 81 mg by mouth daily. Reported on 10/29/2015, Disp: , Rfl:  .  lisinopril-hydrochlorothiazide (ZESTORETIC) 20-12.5 MG tablet, Take 2 tablets by mouth daily., Disp: 60 tablet, Rfl: 1 .  lovastatin (MEVACOR) 20 MG tablet, Take 1 tablet (20 mg total) by mouth at bedtime., Disp: 30 tablet, Rfl: 1 .  metoprolol (LOPRESSOR) 100 MG tablet, Take 1 tablet (100 mg total) by mouth 2 (two) times daily., Disp: 60 tablet, Rfl: 3   Review of Systems  Constitutional: Negative for appetite change, chills, diaphoresis, fatigue, fever and unexpected weight change.  HENT: Negative for congestion, dental problem, drooling, ear pain, facial swelling, hearing loss, mouth sores, sneezing, sore throat, trouble swallowing and voice change.   Eyes: Negative for pain, discharge, redness, itching and visual disturbance.  Respiratory: Negative for cough, choking, shortness of breath and wheezing.   Cardiovascular: Negative for chest pain, palpitations and leg swelling.  Gastrointestinal: Negative for abdominal pain, blood in stool, constipation, diarrhea and vomiting.  Endocrine: Negative for cold intolerance, heat  intolerance and polydipsia.  Genitourinary: Negative for decreased urine volume, dysuria and hematuria.  Musculoskeletal: Negative for arthralgias, back pain and gait problem.  Skin: Negative for rash.  Allergic/Immunologic: Negative for environmental allergies.  Neurological: Negative for seizures, syncope, light-headedness and headaches.  Hematological: Negative for adenopathy.  Psychiatric/Behavioral: Negative for agitation, dysphoric mood and suicidal ideas. The patient is not nervous/anxious.     Per HPI unless specifically indicated above     Objective:    BP (!) 164/86 (BP Location: Left Arm, Patient Position: Sitting, Cuff Size: Normal)   Pulse (!) 58   Temp 97.9 F (36.6 C) (Other (Comment))   Ht 5\' 6"  (1.676 m)   Wt 200 lb 9.6 oz (91 kg)   SpO2 98%   BMI 32.38 kg/m   Wt Readings from Last 3 Encounters:  07/08/16 200 lb 9.6 oz (91 kg)  06/09/16 204 lb (92.5 kg)  03/03/16 205 lb 6.4 oz (93.2 kg)    Physical Exam  Constitutional: He is oriented to person, place, and time. He appears well-developed and well-nourished.  HENT:  Head: Normocephalic and atraumatic.  Neck: Neck supple.  Cardiovascular: Normal rate and regular rhythm.   Pulmonary/Chest: Effort normal and breath sounds normal. He has no wheezes.  Abdominal: Soft. Bowel sounds are normal. There is no hepatosplenomegaly. There is no tenderness.  Musculoskeletal: He exhibits no edema.  Lymphadenopathy:    He has no cervical adenopathy.  Neurological: He is alert and oriented to person, place, and time.  Skin: Skin is warm  and dry.  Psychiatric: He has a normal mood and affect. His behavior is normal.  Vitals reviewed.       Assessment & Plan:   Encounter Diagnoses  Name Primary?  . Essential hypertension, benign Yes  . Chronic kidney disease, unspecified CKD stage   . Class 1 obesity with body mass index (BMI) of 32.0 to 32.9 in adult, unspecified obesity type, unspecified whether serious comorbidity  present   . Hyperlipidemia, unspecified hyperlipidemia type   . Prediabetes      -Reviewed bmp with pt from 1 mo ago.  Cr still elevated.  Refer to nephrology.  -Add clonidine for bp.   -F/u 1 month to recheck blood pressure

## 2016-08-06 ENCOUNTER — Encounter: Payer: Self-pay | Admitting: Physician Assistant

## 2016-08-06 ENCOUNTER — Ambulatory Visit: Payer: Self-pay | Admitting: Physician Assistant

## 2016-08-06 VITALS — BP 176/88 | HR 55 | Temp 97.0°F | Ht 66.0 in | Wt 202.5 lb

## 2016-08-06 DIAGNOSIS — Z6832 Body mass index (BMI) 32.0-32.9, adult: Secondary | ICD-10-CM

## 2016-08-06 DIAGNOSIS — E785 Hyperlipidemia, unspecified: Secondary | ICD-10-CM

## 2016-08-06 DIAGNOSIS — R7303 Prediabetes: Secondary | ICD-10-CM

## 2016-08-06 DIAGNOSIS — Z125 Encounter for screening for malignant neoplasm of prostate: Secondary | ICD-10-CM

## 2016-08-06 DIAGNOSIS — E669 Obesity, unspecified: Secondary | ICD-10-CM

## 2016-08-06 DIAGNOSIS — I10 Essential (primary) hypertension: Secondary | ICD-10-CM

## 2016-08-06 DIAGNOSIS — N189 Chronic kidney disease, unspecified: Secondary | ICD-10-CM

## 2016-08-06 MED ORDER — CLONIDINE HCL 0.2 MG PO TABS: 0.2000 mg | ORAL_TABLET | Freq: Two times a day (BID) | ORAL | 1 refills | Status: DC

## 2016-08-06 NOTE — Progress Notes (Signed)
BP (!) 176/88 (BP Location: Left Arm, Patient Position: Sitting, Cuff Size: Normal)   Pulse (!) 55   Temp 97 F (36.1 C)   Ht 5\' 6"  (1.676 m)   Wt 202 lb 8 oz (91.9 kg)   SpO2 99%   BMI 32.68 kg/m    Subjective:    Patient ID: Timothy [name], male    DOB: 1956-06-20, 61 y.o.   MRN: [id number]  HPI: Timothy [name] is a 61 y.o. male presenting on 08/06/2016 for Hypertension   HPI Pt has appt with nephrologist in February.  He is taking his clonidine.  He feels well today.   Relevant past medical, surgical, family and social history reviewed and updated as indicated. Interim medical history since our last visit reviewed. Allergies and medications reviewed and updated.   Current Outpatient Prescriptions:  .  amLODipine (NORVASC) 10 MG tablet, Take 1 tablet (10 mg total) by mouth daily., Disp: 30 tablet, Rfl: 1 .  aspirin EC 81 MG tablet, Take 81 mg by mouth daily. Reported on 10/29/2015, Disp: , Rfl:  .  cloNIDine (CATAPRES) 0.1 MG tablet, Take 1 tablet (0.1 mg total) by mouth 2 (two) times daily., Disp: 60 tablet, Rfl: 2 .  lisinopril-hydrochlorothiazide (ZESTORETIC) 20-12.5 MG tablet, Take 2 tablets by mouth daily., Disp: 60 tablet, Rfl: 1 .  lovastatin (MEVACOR) 20 MG tablet, Take 1 tablet (20 mg total) by mouth at bedtime., Disp: 30 tablet, Rfl: 1 .  metoprolol (LOPRESSOR) 100 MG tablet, Take 1 tablet (100 mg total) by mouth 2 (two) times daily., Disp: 60 tablet, Rfl: 3   Review of Systems  Constitutional: Negative for appetite change, chills, diaphoresis, fatigue, fever and unexpected weight change.  HENT: Negative for congestion, dental problem, drooling, ear pain, facial swelling, hearing loss, mouth sores, sneezing, sore throat, trouble swallowing and voice change.   Eyes: Negative for pain, discharge, redness, itching and visual disturbance.  Respiratory: Negative for cough, choking, shortness of breath and wheezing.   Cardiovascular: Negative for chest pain,  palpitations and leg swelling.  Gastrointestinal: Negative for abdominal pain, blood in stool, constipation, diarrhea and vomiting.  Endocrine: Negative for cold intolerance, heat intolerance and polydipsia.  Genitourinary: Negative for decreased urine volume, dysuria and hematuria.  Musculoskeletal: Negative for arthralgias, back pain and gait problem.  Skin: Negative for rash.  Allergic/Immunologic: Negative for environmental allergies.  Neurological: Negative for seizures, syncope, light-headedness and headaches.  Hematological: Negative for adenopathy.  Psychiatric/Behavioral: Negative for agitation, dysphoric mood and suicidal ideas. The patient is not nervous/anxious.     Per HPI unless specifically indicated above     Objective:    BP (!) 176/88 (BP Location: Left Arm, Patient Position: Sitting, Cuff Size: Normal)   Pulse (!) 55   Temp 97 F (36.1 C)   Ht 5\' 6"  (1.676 m)   Wt 202 lb 8 oz (91.9 kg)   SpO2 99%   BMI 32.68 kg/m   Wt Readings from Last 3 Encounters:  08/06/16 202 lb 8 oz (91.9 kg)  07/08/16 200 lb 9.6 oz (91 kg)  06/09/16 204 lb (92.5 kg)    Physical Exam  Constitutional: He is oriented to person, place, and time. He appears well-developed and well-nourished.  HENT:  Head: Normocephalic and atraumatic.  Neck: Neck supple.  Cardiovascular: Normal rate and regular rhythm.   Pulmonary/Chest: Effort normal and breath sounds normal. He has no wheezes.  Abdominal: Soft. Bowel sounds are normal. There is no hepatosplenomegaly. There is no tenderness.  Musculoskeletal: He exhibits no edema.  Lymphadenopathy:    He has no cervical adenopathy.  Neurological: He is alert and oriented to person, place, and time.  Skin: Skin is warm and dry.  Psychiatric: He has a normal mood and affect. His behavior is normal.  Vitals reviewed.       Assessment & Plan:   Encounter Diagnoses  Name Primary?  . Essential hypertension, benign Yes  . Chronic kidney disease,  unspecified CKD stage   . Hyperlipidemia, unspecified hyperlipidemia type   . Prediabetes   . Screening for prostate cancer   . Class 1 obesity with body mass index (BMI) of 32.0 to 32.9 in adult, unspecified obesity type, unspecified whether serious comorbidity present      Go tomorrow to get labs drawn.    Increase clonidine to 0.2mg  bid.  Continue current medications  Follow up 1 month to recheck BP.  RTO sooner prn

## 2016-08-06 NOTE — Patient Instructions (Signed)
Dr Lowanda Foster (kidney doctor) [address] [place], [place] [phone]  Wednesday  September 30, 2016 11:00 am

## 2016-08-07 LAB — COMPREHENSIVE METABOLIC PANEL
ALK PHOS: 53 U/L (ref 40–115)
ALT: 9 U/L (ref 9–46)
AST: 13 U/L (ref 10–35)
Albumin: 3.9 g/dL (ref 3.6–5.1)
BUN: 28 mg/dL — ABNORMAL HIGH (ref 7–25)
CALCIUM: 9.2 mg/dL (ref 8.6–10.3)
CO2: 29 mmol/L (ref 20–31)
Chloride: 104 mmol/L (ref 98–110)
Creat: 1.9 mg/dL — ABNORMAL HIGH (ref 0.70–1.25)
GLUCOSE: 138 mg/dL — AB (ref 65–99)
Potassium: 4.3 mmol/L (ref 3.5–5.3)
Sodium: 140 mmol/L (ref 135–146)
TOTAL PROTEIN: 6.4 g/dL (ref 6.1–8.1)
Total Bilirubin: 0.7 mg/dL (ref 0.2–1.2)

## 2016-08-07 LAB — LIPID PANEL
CHOL/HDL RATIO: 4.7 ratio (ref <5.0)
CHOLESTEROL: 180 mg/dL (ref <200)
HDL: 38 mg/dL — AB (ref >40)
LDL Cholesterol: 113 mg/dL — ABNORMAL HIGH (ref <100)
Triglycerides: 147 mg/dL (ref <150)
VLDL: 29 mg/dL (ref <30)

## 2016-08-07 LAB — PSA: PSA: 0.9 ng/mL (ref <=4.0)

## 2016-08-07 LAB — HEMOGLOBIN A1C
Hgb A1c MFr Bld: 6.3 % — ABNORMAL HIGH (ref <5.7)
Mean Plasma Glucose: 134 mg/dL

## 2016-08-29 ENCOUNTER — Other Ambulatory Visit: Payer: Self-pay | Admitting: Physician Assistant

## 2016-09-07 ENCOUNTER — Ambulatory Visit: Payer: Self-pay | Admitting: Physician Assistant

## 2016-09-07 ENCOUNTER — Encounter: Payer: Self-pay | Admitting: Physician Assistant

## 2016-09-07 VITALS — BP 182/100 | HR 81 | Temp 97.7°F | Ht 66.0 in | Wt 208.8 lb

## 2016-09-07 DIAGNOSIS — E669 Obesity, unspecified: Secondary | ICD-10-CM

## 2016-09-07 DIAGNOSIS — R7303 Prediabetes: Secondary | ICD-10-CM

## 2016-09-07 DIAGNOSIS — I10 Essential (primary) hypertension: Secondary | ICD-10-CM

## 2016-09-07 DIAGNOSIS — E785 Hyperlipidemia, unspecified: Secondary | ICD-10-CM

## 2016-09-07 DIAGNOSIS — Z91199 Patient's noncompliance with other medical treatment and regimen due to unspecified reason: Secondary | ICD-10-CM

## 2016-09-07 DIAGNOSIS — Z9119 Patient's noncompliance with other medical treatment and regimen: Secondary | ICD-10-CM

## 2016-09-07 DIAGNOSIS — Z6833 Body mass index (BMI) 33.0-33.9, adult: Secondary | ICD-10-CM

## 2016-09-07 DIAGNOSIS — N189 Chronic kidney disease, unspecified: Secondary | ICD-10-CM

## 2016-09-07 MED ORDER — CLONIDINE HCL 0.1 MG PO TABS
0.1000 mg | ORAL_TABLET | Freq: Once | ORAL | Status: AC
  Administered 2016-09-07: 0.1 mg via ORAL

## 2016-09-07 MED ORDER — LOVASTATIN 20 MG PO TABS: 20.0000 mg | ORAL_TABLET | Freq: Every day | ORAL | 1 refills | Status: DC

## 2016-09-07 MED ORDER — METOPROLOL TARTRATE 100 MG PO TABS: 100.0000 mg | ORAL_TABLET | Freq: Two times a day (BID) | ORAL | 1 refills | Status: DC

## 2016-09-07 MED ORDER — AMLODIPINE BESYLATE 10 MG PO TABS: 10.0000 mg | ORAL_TABLET | Freq: Every day | ORAL | 1 refills | Status: DC

## 2016-09-07 MED ORDER — LISINOPRIL-HYDROCHLOROTHIAZIDE 20-12.5 MG PO TABS: 2.0000 | ORAL_TABLET | Freq: Every day | ORAL | 1 refills | Status: DC

## 2016-09-07 MED ORDER — CLONIDINE HCL 0.2 MG PO TABS: 0.2000 mg | ORAL_TABLET | Freq: Two times a day (BID) | ORAL | 1 refills | Status: DC

## 2016-09-07 NOTE — Progress Notes (Signed)
BP (!) 214/110 (BP Location: Left Arm, Patient Position: Sitting, Cuff Size: Normal)   Pulse 81   Temp 97.7 F (36.5 C)   Ht 5\' 6"  (1.676 m)   Wt 208 lb 12 oz (94.7 kg)   SpO2 99%   BMI 33.69 kg/m    Subjective:    Patient ID: Timothy [name], male    DOB: 02/10/1956, 61 y.o.   MRN: [id number]  HPI: Timothy [name] is a 61 y.o. male presenting on 09/07/2016 for Hypertension (pt ran out of medications last week. pt states feeling well)   HPI  Chief Complaint  Patient presents with  . Hypertension    pt ran out of medications last week. pt states feeling well    Pt has appt with nephrologist on February 20-something.   Pt says he is feeling well and denies HA, CP, vision changes.  Relevant past medical, surgical, family and social history reviewed and updated as indicated. Interim medical history since our last visit reviewed. Allergies and medications reviewed and updated.   Current Outpatient Prescriptions:  .  aspirin EC 81 MG tablet, Take 81 mg by mouth daily. Reported on 10/29/2015, Disp: , Rfl:  .  amLODipine (NORVASC) 10 MG tablet, TAKE ONE TABLET BY MOUTH ONCE DAILY (Patient not taking: Reported on 09/07/2016), Disp: 30 tablet, Rfl: 1 .  cloNIDine (CATAPRES) 0.2 MG tablet, Take 1 tablet (0.2 mg total) by mouth 2 (two) times daily. (Patient not taking: Reported on 09/07/2016), Disp: 60 tablet, Rfl: 1 .  lisinopril-hydrochlorothiazide (ZESTORETIC) 20-12.5 MG tablet, Take 2 tablets by mouth daily. (Patient not taking: Reported on 09/07/2016), Disp: 60 tablet, Rfl: 1 .  lovastatin (MEVACOR) 20 MG tablet, Take 1 tablet (20 mg total) by mouth at bedtime. (Patient not taking: Reported on 09/07/2016), Disp: 30 tablet, Rfl: 1 .  metoprolol (LOPRESSOR) 100 MG tablet, Take 1 tablet (100 mg total) by mouth 2 (two) times daily. (Patient not taking: Reported on 09/07/2016), Disp: 60 tablet, Rfl: 3   Review of Systems  Constitutional: Negative for appetite change, chills, diaphoresis,  fatigue, fever and unexpected weight change.  HENT: Negative for congestion, dental problem, drooling, ear pain, facial swelling, hearing loss, mouth sores, sneezing, sore throat, trouble swallowing and voice change.   Eyes: Negative for pain, discharge, redness, itching and visual disturbance.  Respiratory: Negative for cough, choking, shortness of breath and wheezing.   Cardiovascular: Negative for chest pain, palpitations and leg swelling.  Gastrointestinal: Negative for abdominal pain, blood in stool, constipation, diarrhea and vomiting.  Endocrine: Negative for cold intolerance, heat intolerance and polydipsia.  Genitourinary: Negative for decreased urine volume, dysuria and hematuria.  Musculoskeletal: Negative for arthralgias, back pain and gait problem.  Skin: Negative for rash.  Allergic/Immunologic: Negative for environmental allergies.  Neurological: Negative for seizures, syncope, light-headedness and headaches.  Hematological: Negative for adenopathy.  Psychiatric/Behavioral: Negative for agitation, dysphoric mood and suicidal ideas. The patient is not nervous/anxious.     Per HPI unless specifically indicated above     Objective:    BP (!) 214/110 (BP Location: Left Arm, Patient Position: Sitting, Cuff Size: Normal)   Pulse 81   Temp 97.7 F (36.5 C)   Ht 5\' 6"  (1.676 m)   Wt 208 lb 12 oz (94.7 kg)   SpO2 99%   BMI 33.69 kg/m   Wt Readings from Last 3 Encounters:  09/07/16 208 lb 12 oz (94.7 kg)  08/06/16 202 lb 8 oz (91.9 kg)  07/08/16 200 lb 9.6  oz (91 kg)    Physical Exam  Constitutional: He is oriented to person, place, and time. He appears well-developed and well-nourished.  HENT:  Head: Normocephalic and atraumatic.  Neck: Neck supple.  Cardiovascular: Normal rate and regular rhythm.   Pulmonary/Chest: Effort normal and breath sounds normal. He has no wheezes.  Abdominal: Soft. Bowel sounds are normal. There is no hepatosplenomegaly. There is no  tenderness.  Musculoskeletal: He exhibits no edema.  Lymphadenopathy:    He has no cervical adenopathy.  Neurological: He is alert and oriented to person, place, and time.  Skin: Skin is warm and dry.  Psychiatric: He has a normal mood and affect. His behavior is normal.  Vitals reviewed.    Clonidine 0.1 mg given at  8:55am , again at 9:15am  Results for orders placed or performed in visit on 08/06/16  Comprehensive Metabolic Panel (CMET)  Result Value Ref Range   Sodium 140 135 - 146 mmol/L   Potassium 4.3 3.5 - 5.3 mmol/L   Chloride 104 98 - 110 mmol/L   CO2 29 20 - 31 mmol/L   Glucose, Bld 138 (H) 65 - 99 mg/dL   BUN 28 (H) 7 - 25 mg/dL   Creat 1.90 (H) 0.70 - 1.25 mg/dL   Total Bilirubin 0.7 0.2 - 1.2 mg/dL   Alkaline Phosphatase 53 40 - 115 U/L   AST 13 10 - 35 U/L   ALT 9 9 - 46 U/L   Total Protein 6.4 6.1 - 8.1 g/dL   Albumin 3.9 3.6 - 5.1 g/dL   Calcium 9.2 8.6 - 10.3 mg/dL  Lipid Profile  Result Value Ref Range   Cholesterol 180 <200 mg/dL   Triglycerides 147 <150 mg/dL   HDL 38 (L) >40 mg/dL   Total CHOL/HDL Ratio 4.7 <5.0 Ratio   VLDL 29 <30 mg/dL   LDL Cholesterol 113 (H) <100 mg/dL  HgB A1c  Result Value Ref Range   Hgb A1c MFr Bld 6.3 (H) <5.7 %   Mean Plasma Glucose 134 mg/dL  PSA  Result Value Ref Range   PSA 0.9 <=4.0 ng/mL      Assessment & Plan:    Encounter Diagnoses  Name Primary?  . Essential hypertension Yes  . Personal history of noncompliance with medical treatment, presenting hazards to health   . Prediabetes   . Chronic kidney disease, unspecified CKD stage   . Hyperlipidemia, unspecified hyperlipidemia type   . Class 1 obesity with body mass index (BMI) of 33.0 to 33.9 in adult, unspecified obesity type, unspecified whether serious comorbidity present     -Reviewed labs with pt -Counseled on prediabetes. And gave handout -Again counseled pt at length about not running out of his meds.  Discussed that it could have very serious  consequences including but not limited to heart attack, stroke, kidney failure, death.  -refills of meds sent to [facility] and pt medassist renewed (he has to bring in financial papers to complete process) -pt to follow up with nephrologist as scheduled -follow up on month to recheck BP

## 2016-09-07 NOTE — Patient Instructions (Addendum)
DASH Eating Plan DASH stands for "Dietary Approaches to Stop Hypertension." The DASH eating plan is a healthy eating plan that has been shown to reduce high blood pressure (hypertension). Additional health benefits may include reducing the risk of type 2 diabetes mellitus, heart disease, and stroke. The DASH eating plan may also help with weight loss. What do I need to know about the DASH eating plan? For the DASH eating plan, you will follow these general guidelines:  Choose foods with less than 150 milligrams of sodium per serving (as listed on the food label).  Use salt-free seasonings or herbs instead of table salt or sea salt.  Check with your health care provider or pharmacist before using salt substitutes.  Eat lower-sodium products. These are often labeled as "low-sodium" or "no salt added."  Eat fresh foods. Avoid eating a lot of canned foods.  Eat more vegetables, fruits, and low-fat dairy products.  Choose whole grains. Look for the word "whole" as the first word in the ingredient list.  Choose fish and skinless chicken or turkey more often than red meat. Limit fish, poultry, and meat to 6 oz (170 g) each day.  Limit sweets, desserts, sugars, and sugary drinks.  Choose heart-healthy fats.  Eat more home-cooked food and less restaurant, buffet, and fast food.  Limit fried foods.  Do not fry foods. Cook foods using methods such as baking, boiling, grilling, and broiling instead.  When eating at a restaurant, ask that your food be prepared with less salt, or no salt if possible. What foods can I eat? Seek help from a dietitian for individual calorie needs. Grains  Whole grain or whole wheat bread. Brown rice. Whole grain or whole wheat pasta. Quinoa, bulgur, and whole grain cereals. Low-sodium cereals. Corn or whole wheat flour tortillas. Whole grain cornbread. Whole grain crackers. Low-sodium crackers. Vegetables  Fresh or frozen vegetables (raw, steamed, roasted, or  grilled). Low-sodium or reduced-sodium tomato and vegetable juices. Low-sodium or reduced-sodium tomato sauce and paste. Low-sodium or reduced-sodium canned vegetables. Fruits  All fresh, canned (in natural juice), or frozen fruits. Meat and Other Protein Products  Ground beef (85% or leaner), grass-fed beef, or beef trimmed of fat. Skinless chicken or turkey. Ground chicken or turkey. Pork trimmed of fat. All fish and seafood. Eggs. Dried beans, peas, or lentils. Unsalted nuts and seeds. Unsalted canned beans. Dairy  Low-fat dairy products, such as skim or 1% milk, 2% or reduced-fat cheeses, low-fat ricotta or cottage cheese, or plain low-fat yogurt. Low-sodium or reduced-sodium cheeses. Fats and Oils  Tub margarines without trans fats. Light or reduced-fat mayonnaise and salad dressings (reduced sodium). Avocado. Safflower, olive, or canola oils. Natural peanut or almond butter. Other  Unsalted popcorn and pretzels. The items listed above may not be a complete list of recommended foods or beverages. Contact your dietitian for more options.  What foods are not recommended? Grains  White bread. White pasta. White rice. Refined cornbread. Bagels and croissants. Crackers that contain trans fat. Vegetables  Creamed or fried vegetables. Vegetables in a cheese sauce. Regular canned vegetables. Regular canned tomato sauce and paste. Regular tomato and vegetable juices. Fruits  Canned fruit in light or heavy syrup. Fruit juice. Meat and Other Protein Products  Fatty cuts of meat. Ribs, chicken wings, bacon, sausage, bologna, salami, chitterlings, fatback, hot dogs, bratwurst, and packaged luncheon meats. Salted nuts and seeds. Canned beans with salt. Dairy  Whole or 2% milk, cream, half-and-half, and cream cheese. Whole-fat or sweetened yogurt. Full-fat cheeses   or blue cheese. Nondairy creamers and whipped toppings. Processed cheese, cheese spreads, or cheese curds. Condiments  Onion and garlic  salt, seasoned salt, table salt, and sea salt. Canned and packaged gravies. Worcestershire sauce. Tartar sauce. Barbecue sauce. Teriyaki sauce. Soy sauce, including reduced sodium. Steak sauce. Fish sauce. Oyster sauce. Cocktail sauce. Horseradish. Ketchup and mustard. Meat flavorings and tenderizers. Bouillon cubes. Hot sauce. Tabasco sauce. Marinades. Taco seasonings. Relishes. Fats and Oils  Butter, stick margarine, lard, shortening, ghee, and bacon fat. Coconut, palm kernel, or palm oils. Regular salad dressings. Other  Pickles and olives. Salted popcorn and pretzels. The items listed above may not be a complete list of foods and beverages to avoid. Contact your dietitian for more information.  Where can I find more information? National Heart, [employer], [employer] Institute: www.nhlbi.nih.gov/health/health-topics/topics/dash/ This information is not intended to replace advice given to you by your health care provider. Make sure you discuss any questions you have with your health care provider. Document Released: 07/09/2011 Document Revised: 12/26/2015 Document Reviewed: 05/24/2013 Elsevier Interactive Patient Education  2017 Else[employer] Inc.  

## 2016-10-05 ENCOUNTER — Ambulatory Visit: Payer: Self-pay | Admitting: Physician Assistant

## 2016-10-05 ENCOUNTER — Encounter: Payer: Self-pay | Admitting: Physician Assistant

## 2016-10-05 VITALS — BP 142/80 | HR 59 | Temp 97.5°F | Ht 66.0 in | Wt 202.0 lb

## 2016-10-05 DIAGNOSIS — N189 Chronic kidney disease, unspecified: Secondary | ICD-10-CM

## 2016-10-05 DIAGNOSIS — E785 Hyperlipidemia, unspecified: Secondary | ICD-10-CM

## 2016-10-05 DIAGNOSIS — R7303 Prediabetes: Secondary | ICD-10-CM

## 2016-10-05 DIAGNOSIS — E669 Obesity, unspecified: Secondary | ICD-10-CM

## 2016-10-05 DIAGNOSIS — I10 Essential (primary) hypertension: Secondary | ICD-10-CM

## 2016-10-05 DIAGNOSIS — Z6832 Body mass index (BMI) 32.0-32.9, adult: Secondary | ICD-10-CM

## 2016-10-05 NOTE — Progress Notes (Signed)
BP (!) 142/80 (BP Location: Left Arm, Patient Position: Sitting, Cuff Size: Normal)   Pulse (!) 59   Temp 97.5 F (36.4 C)   Ht 5\' 6"  (1.676 m)   Wt 202 lb (91.6 kg)   SpO2 99%   BMI 32.60 kg/m    Subjective:    Patient ID: Timothy [name], male    DOB: 20-Feb-1956, 61 y.o.   MRN: [id number]  HPI: Timothy [name] is a 61 y.o. male presenting on 10/05/2016 for Hypertension   HPI   Pt says he applied for insurance  Pt went to nephrologist in feb and says he has appt to f/u there- he thinks in April  Pt is taking all of his meds today!  Relevant past medical, surgical, family and social history reviewed and updated as indicated. Interim medical history since our last visit reviewed. Allergies and medications reviewed and updated.   Current Outpatient Prescriptions:  .  amLODipine (NORVASC) 10 MG tablet, Take 1 tablet (10 mg total) by mouth daily., Disp: 30 tablet, Rfl: 1 .  aspirin EC 81 MG tablet, Take 81 mg by mouth daily. Reported on 10/29/2015, Disp: , Rfl:  .  cloNIDine (CATAPRES) 0.2 MG tablet, Take 1 tablet (0.2 mg total) by mouth 2 (two) times daily., Disp: 60 tablet, Rfl: 1 .  lisinopril-hydrochlorothiazide (ZESTORETIC) 20-12.5 MG tablet, Take 2 tablets by mouth daily., Disp: 60 tablet, Rfl: 1 .  lovastatin (MEVACOR) 20 MG tablet, Take 1 tablet (20 mg total) by mouth at bedtime., Disp: 30 tablet, Rfl: 1 .  metoprolol (LOPRESSOR) 100 MG tablet, Take 1 tablet (100 mg total) by mouth 2 (two) times daily., Disp: 60 tablet, Rfl: 1   Review of Systems  Constitutional: Negative for appetite change, chills, diaphoresis, fatigue, fever and unexpected weight change.  HENT: Negative for congestion, dental problem, drooling, ear pain, facial swelling, hearing loss, mouth sores, sneezing, sore throat, trouble swallowing and voice change.   Eyes: Negative for pain, discharge, redness, itching and visual disturbance.  Respiratory: Negative for cough, choking, shortness of breath  and wheezing.   Cardiovascular: Negative for chest pain, palpitations and leg swelling.  Gastrointestinal: Negative for abdominal pain, blood in stool, constipation, diarrhea and vomiting.  Endocrine: Negative for cold intolerance, heat intolerance and polydipsia.  Genitourinary: Negative for decreased urine volume, dysuria and hematuria.  Musculoskeletal: Negative for arthralgias, back pain and gait problem.  Skin: Negative for rash.  Allergic/Immunologic: Negative for environmental allergies.  Neurological: Negative for seizures, syncope, light-headedness and headaches.  Hematological: Negative for adenopathy.  Psychiatric/Behavioral: Negative for agitation, dysphoric mood and suicidal ideas. The patient is not nervous/anxious.     Per HPI unless specifically indicated above     Objective:    BP (!) 142/80 (BP Location: Left Arm, Patient Position: Sitting, Cuff Size: Normal)   Pulse (!) 59   Temp 97.5 F (36.4 C)   Ht 5\' 6"  (1.676 m)   Wt 202 lb (91.6 kg)   SpO2 99%   BMI 32.60 kg/m   Wt Readings from Last 3 Encounters:  10/05/16 202 lb (91.6 kg)  09/07/16 208 lb 12 oz (94.7 kg)  08/06/16 202 lb 8 oz (91.9 kg)    Physical Exam  Constitutional: He is oriented to person, place, and time. He appears well-developed and well-nourished.  HENT:  Head: Normocephalic and atraumatic.  Neck: Neck supple.  Cardiovascular: Normal rate and regular rhythm.   Pulmonary/Chest: Effort normal and breath sounds normal. He has no wheezes.  Abdominal:  Soft. Bowel sounds are normal. There is no hepatosplenomegaly. There is no tenderness.  Musculoskeletal: He exhibits no edema.  Lymphadenopathy:    He has no cervical adenopathy.  Neurological: He is alert and oriented to person, place, and time.  Skin: Skin is warm and dry.  Psychiatric: He has a normal mood and affect. His behavior is normal.  Vitals reviewed.   Results for orders placed or performed in visit on 08/06/16  Comprehensive  Metabolic Panel (CMET)  Result Value Ref Range   Sodium 140 135 - 146 mmol/L   Potassium 4.3 3.5 - 5.3 mmol/L   Chloride 104 98 - 110 mmol/L   CO2 29 20 - 31 mmol/L   Glucose, Bld 138 (H) 65 - 99 mg/dL   BUN 28 (H) 7 - 25 mg/dL   Creat 1.90 (H) 0.70 - 1.25 mg/dL   Total Bilirubin 0.7 0.2 - 1.2 mg/dL   Alkaline Phosphatase 53 40 - 115 U/L   AST 13 10 - 35 U/L   ALT 9 9 - 46 U/L   Total Protein 6.4 6.1 - 8.1 g/dL   Albumin 3.9 3.6 - 5.1 g/dL   Calcium 9.2 8.6 - 10.3 mg/dL  Lipid Profile  Result Value Ref Range   Cholesterol 180 <200 mg/dL   Triglycerides 147 <150 mg/dL   HDL 38 (L) >40 mg/dL   Total CHOL/HDL Ratio 4.7 <5.0 Ratio   VLDL 29 <30 mg/dL   LDL Cholesterol 113 (H) <100 mg/dL  HgB A1c  Result Value Ref Range   Hgb A1c MFr Bld 6.3 (H) <5.7 %   Mean Plasma Glucose 134 mg/dL  PSA  Result Value Ref Range   PSA 0.9 <=4.0 ng/mL      Assessment & Plan:   Encounter Diagnoses  Name Primary?  . Essential hypertension Yes  . Chronic kidney disease, unspecified CKD stage   . Hyperlipidemia, unspecified hyperlipidemia type   . Prediabetes   . Class 1 obesity with body mass index (BMI) of 32.0 to 32.9 in adult, unspecified obesity type, unspecified whether serious comorbidity present       -pt to continue current medications -pt to follow up with nephrologist as scheduled -follow up here in 3 months.  RTO sooner prn -pt counseled to cancel appointment if he gets insurance as he will need to get new PCP in that case [facility] does not accept insurance).  He states understanding

## 2016-10-19 ENCOUNTER — Other Ambulatory Visit: Payer: Self-pay | Admitting: Physician Assistant

## 2016-10-20 ENCOUNTER — Other Ambulatory Visit: Payer: Self-pay | Admitting: Physician Assistant

## 2016-10-20 DIAGNOSIS — N189 Chronic kidney disease, unspecified: Secondary | ICD-10-CM

## 2016-10-22 LAB — BASIC METABOLIC PANEL
BUN: 19 mg/dL (ref 7–25)
CALCIUM: 8.7 mg/dL (ref 8.6–10.3)
CO2: 32 mmol/L — AB (ref 20–31)
CREATININE: 1.65 mg/dL — AB (ref 0.70–1.25)
Chloride: 105 mmol/L (ref 98–110)
Glucose, Bld: 132 mg/dL — ABNORMAL HIGH (ref 65–99)
Potassium: 4.3 mmol/L (ref 3.5–5.3)
Sodium: 140 mmol/L (ref 135–146)

## 2016-11-02 ENCOUNTER — Ambulatory Visit (HOSPITAL_COMMUNITY): Admission: RE | Admit: 2016-11-02 | Payer: Self-pay | Source: Ambulatory Visit

## 2017-01-05 ENCOUNTER — Ambulatory Visit: Payer: Self-pay | Admitting: Physician Assistant

## 2017-02-10 ENCOUNTER — Encounter: Payer: Self-pay | Admitting: Physician Assistant

## 2017-02-10 ENCOUNTER — Ambulatory Visit: Payer: Self-pay | Admitting: Physician Assistant

## 2017-02-10 VITALS — BP 208/110 | HR 84 | Temp 97.9°F | Ht 66.0 in | Wt 197.8 lb

## 2017-02-10 DIAGNOSIS — Z9119 Patient's noncompliance with other medical treatment and regimen: Secondary | ICD-10-CM

## 2017-02-10 DIAGNOSIS — Z91199 Patient's noncompliance with other medical treatment and regimen due to unspecified reason: Secondary | ICD-10-CM

## 2017-02-10 DIAGNOSIS — R7303 Prediabetes: Secondary | ICD-10-CM

## 2017-02-10 DIAGNOSIS — N189 Chronic kidney disease, unspecified: Secondary | ICD-10-CM

## 2017-02-10 DIAGNOSIS — I10 Essential (primary) hypertension: Secondary | ICD-10-CM

## 2017-02-10 DIAGNOSIS — E785 Hyperlipidemia, unspecified: Secondary | ICD-10-CM

## 2017-02-10 DIAGNOSIS — E669 Obesity, unspecified: Secondary | ICD-10-CM

## 2017-02-10 MED ORDER — LISINOPRIL-HYDROCHLOROTHIAZIDE 20-12.5 MG PO TABS: 2.0000 | ORAL_TABLET | Freq: Every day | ORAL | 1 refills | Status: DC

## 2017-02-10 MED ORDER — METOPROLOL TARTRATE 100 MG PO TABS: 100.0000 mg | ORAL_TABLET | Freq: Two times a day (BID) | ORAL | 1 refills | Status: DC

## 2017-02-10 MED ORDER — CLONIDINE HCL 0.2 MG PO TABS: 0.2000 mg | ORAL_TABLET | Freq: Two times a day (BID) | ORAL | 1 refills | Status: DC

## 2017-02-10 MED ORDER — LOVASTATIN 20 MG PO TABS: 20.0000 mg | ORAL_TABLET | Freq: Every day | ORAL | 1 refills | Status: DC

## 2017-02-10 MED ORDER — AMLODIPINE BESYLATE 10 MG PO TABS: 10.0000 mg | ORAL_TABLET | Freq: Every day | ORAL | 1 refills | Status: DC

## 2017-02-10 MED ORDER — CLONIDINE HCL 0.1 MG PO TABS
0.1000 mg | ORAL_TABLET | Freq: Once | ORAL | Status: AC
  Administered 2017-02-10: 0.1 mg via ORAL

## 2017-02-10 NOTE — Progress Notes (Signed)
BP (!) 210/110 (BP Location: Left Arm, Patient Position: Sitting, Cuff Size: Normal)   Pulse 84   Temp 97.9 F (36.6 C)   Ht 5\' 6"  (1.676 m)   Wt 197 lb 12 oz (89.7 kg)   SpO2 98%   BMI 31.92 kg/m    Subjective:    Patient ID: Beulah Gandy, male    DOB: October 21, 1955, 61 y.o.   MRN: [id number]  HPI: GIANMARCO ROYE is a 61 y.o. male presenting on 02/10/2017 for Follow-up (pt with high blood pressure reading today. pt states he feels good.)   HPI  Pt says he has been out of all his meds for about a week  Pt says he went to nephrologist in feb but wasn't scheduled for f/u  Pt says he is feel good today.  He denies HA, vision changes, CP.   Relevant past medical, surgical, family and social history reviewed and updated as indicated. Interim medical history since our last visit reviewed. Allergies and medications reviewed and updated.   Current Outpatient Prescriptions:  .  aspirin EC 81 MG tablet, Take 81 mg by mouth daily. Reported on 10/29/2015, Disp: , Rfl:  .  amLODipine (NORVASC) 10 MG tablet, Take 1 tablet (10 mg total) by mouth daily. (Patient not taking: Reported on 02/10/2017), Disp: 30 tablet, Rfl: 1 .  cloNIDine (CATAPRES) 0.2 MG tablet, Take 1 tablet (0.2 mg total) by mouth 2 (two) times daily. (Patient not taking: Reported on 02/10/2017), Disp: 60 tablet, Rfl: 1 .  lisinopril-hydrochlorothiazide (ZESTORETIC) 20-12.5 MG tablet, Take 2 tablets by mouth daily. (Patient not taking: Reported on 02/10/2017), Disp: 60 tablet, Rfl: 1 .  lovastatin (MEVACOR) 20 MG tablet, Take 1 tablet (20 mg total) by mouth at bedtime. (Patient not taking: Reported on 02/10/2017), Disp: 30 tablet, Rfl: 1 .  metoprolol (LOPRESSOR) 100 MG tablet, Take 1 tablet (100 mg total) by mouth 2 (two) times daily. (Patient not taking: Reported on 02/10/2017), Disp: 60 tablet, Rfl: 1   Review of Systems  Constitutional: Negative for appetite change, chills, diaphoresis, fatigue, fever and unexpected weight  change.  HENT: Negative for congestion, dental problem, drooling, ear pain, facial swelling, hearing loss, mouth sores, sneezing, sore throat, trouble swallowing and voice change.   Eyes: Negative for pain, discharge, redness, itching and visual disturbance.  Respiratory: Negative for cough, choking, shortness of breath and wheezing.   Cardiovascular: Negative for chest pain, palpitations and leg swelling.  Gastrointestinal: Negative for abdominal pain, blood in stool, constipation, diarrhea and vomiting.  Endocrine: Negative for cold intolerance, heat intolerance and polydipsia.  Genitourinary: Negative for decreased urine volume, dysuria and hematuria.  Musculoskeletal: Negative for arthralgias, back pain and gait problem.  Skin: Negative for rash.  Allergic/Immunologic: Negative for environmental allergies.  Neurological: Negative for seizures, syncope, light-headedness and headaches.  Hematological: Negative for adenopathy.  Psychiatric/Behavioral: Negative for agitation, dysphoric mood and suicidal ideas. The patient is not nervous/anxious.     Per HPI unless specifically indicated above     Objective:    BP (!) 210/110 (BP Location: Left Arm, Patient Position: Sitting, Cuff Size: Normal)   Pulse 84   Temp 97.9 F (36.6 C)   Ht 5\' 6"  (1.676 m)   Wt 197 lb 12 oz (89.7 kg)   SpO2 98%   BMI 31.92 kg/m   Wt Readings from Last 3 Encounters:  02/10/17 197 lb 12 oz (89.7 kg)  10/05/16 202 lb (91.6 kg)  09/07/16 208 lb 12 oz (94.7 kg)  Physical Exam  Constitutional: He is oriented to person, place, and time. He appears well-developed and well-nourished.  HENT:  Head: Normocephalic and atraumatic.  Neck: Neck supple.  Cardiovascular: Normal rate and regular rhythm.   Pulmonary/Chest: Effort normal and breath sounds normal. He has no wheezes.  Abdominal: Soft. Bowel sounds are normal. There is no hepatosplenomegaly. There is no tenderness.  Musculoskeletal: He exhibits no  edema.  Lymphadenopathy:    He has no cervical adenopathy.  Neurological: He is alert and oriented to person, place, and time.  Skin: Skin is warm and dry.  Psychiatric: He has a normal mood and affect. His behavior is normal.  Vitals reviewed.       Assessment & Plan:   Encounter Diagnoses  Name Primary?  . Essential hypertension Yes  . Personal history of noncompliance with medical treatment, presenting hazards to health   . Chronic kidney disease, unspecified CKD stage   . Hyperlipidemia, unspecified hyperlipidemia type   . Prediabetes   . Obesity, unspecified classification, unspecified obesity type, unspecified whether serious comorbidity present      -Renew medassist- pt needs to bring a form to complete renewal We will check with nephrologist to see when they want to see him back.  Last OV notes states that pt was to get labs and [place] and then follow up.  Pt no-showed for [place].   -pt urged to pick up his medications TODAY and get back on them.  He says that he will -pt to follow up in 3-4 weeks to recheck bp.  He is to RTO sooner if he starts feeling bad.  He is to go to ER for cp or HA

## 2017-02-11 ENCOUNTER — Other Ambulatory Visit: Payer: Self-pay | Admitting: Physician Assistant

## 2017-02-16 ENCOUNTER — Other Ambulatory Visit (HOSPITAL_COMMUNITY)
Admission: RE | Admit: 2017-02-16 | Discharge: 2017-02-16 | Disposition: A | Discharge status: Home / Self Care | Payer: No Typology Code available for payment source | Source: Ambulatory Visit | Attending: Physician Assistant | Admitting: Physician Assistant

## 2017-02-16 LAB — HEPATIC FUNCTION PANEL
ALBUMIN: 4.2 g/dL (ref 3.5–5.0)
ALT: 15 U/L — ABNORMAL LOW (ref 17–63)
AST: 22 U/L (ref 15–41)
Alkaline Phosphatase: 70 U/L (ref 38–126)
Bilirubin, Direct: 0.1 mg/dL (ref 0.1–0.5)
Indirect Bilirubin: 1.2 mg/dL — ABNORMAL HIGH (ref 0.3–0.9)
TOTAL PROTEIN: 7.4 g/dL (ref 6.5–8.1)
Total Bilirubin: 1.3 mg/dL — ABNORMAL HIGH (ref 0.3–1.2)

## 2017-02-16 LAB — CBC WITH DIFFERENTIAL/PLATELET
Basophils Absolute: 0.1 10*3/uL (ref 0.0–0.1)
Basophils Relative: 1 %
EOS PCT: 3 %
Eosinophils Absolute: 0.2 10*3/uL (ref 0.0–0.7)
HEMATOCRIT: 48.1 % (ref 39.0–52.0)
Hemoglobin: 16.4 g/dL (ref 13.0–17.0)
LYMPHS ABS: 1.5 10*3/uL (ref 0.7–4.0)
LYMPHS PCT: 31 %
MCH: 27.8 pg (ref 26.0–34.0)
MCHC: 34.1 g/dL (ref 30.0–36.0)
MCV: 81.7 fL (ref 78.0–100.0)
Monocytes Absolute: 0.6 10*3/uL (ref 0.1–1.0)
Monocytes Relative: 12 %
NEUTROS ABS: 2.5 10*3/uL (ref 1.7–7.7)
Neutrophils Relative %: 53 %
PLATELETS: 129 10*3/uL — AB (ref 150–400)
RBC: 5.89 MIL/uL — AB (ref 4.22–5.81)
RDW: 13.8 % (ref 11.5–15.5)
WBC: 4.7 10*3/uL (ref 4.0–10.5)

## 2017-02-16 LAB — FOLATE: FOLATE: 22.4 ng/mL (ref >5.9)

## 2017-02-16 LAB — VITAMIN B12: VITAMIN B 12: 217 pg/mL (ref 180–914)

## 2017-02-16 LAB — URINALYSIS, ROUTINE W REFLEX MICROSCOPIC
Bacteria, UA: NONE SEEN
Bilirubin Urine: NEGATIVE
GLUCOSE, UA: NEGATIVE mg/dL
HGB URINE DIPSTICK: NEGATIVE
Ketones, ur: NEGATIVE mg/dL
Leukocytes, UA: NEGATIVE
Nitrite: NEGATIVE
PH: 6 (ref 5.0–8.0)
Protein, ur: 30 mg/dL — AB
SPECIFIC GRAVITY, URINE: 1.018 (ref 1.005–1.030)

## 2017-02-16 LAB — RENAL FUNCTION PANEL
Albumin: 4.2 g/dL (ref 3.5–5.0)
Anion gap: 8 (ref 5–15)
BUN: 11 mg/dL (ref 6–20)
CHLORIDE: 103 mmol/L (ref 101–111)
CO2: 28 mmol/L (ref 22–32)
Calcium: 9.1 mg/dL (ref 8.9–10.3)
Creatinine, Ser: 1.28 mg/dL — ABNORMAL HIGH (ref 0.61–1.24)
GFR calc Af Amer: >60 mL/min (ref >60)
GFR calc non Af Amer: 58 mL/min — ABNORMAL LOW (ref >60)
GLUCOSE: 128 mg/dL — AB (ref 65–99)
POTASSIUM: 4.4 mmol/L (ref 3.5–5.1)
Phosphorus: 2.7 mg/dL (ref 2.5–4.6)
Sodium: 139 mmol/L (ref 135–145)

## 2017-02-16 LAB — IRON AND TIBC
Iron: 56 ug/dL (ref 45–182)
Saturation Ratios: 18 % (ref 17.9–39.5)
TIBC: 314 ug/dL (ref 250–450)
UIBC: 258 ug/dL

## 2017-02-16 LAB — LIPID PANEL
CHOL/HDL RATIO: 3.4 ratio
CHOLESTEROL: 204 mg/dL — AB (ref 0–200)
HDL: 60 mg/dL (ref >40)
LDL Cholesterol: 123 mg/dL — ABNORMAL HIGH (ref 0–99)
TRIGLYCERIDES: 106 mg/dL (ref <150)
VLDL: 21 mg/dL (ref 0–40)

## 2017-02-16 LAB — FERRITIN: FERRITIN: 44 ng/mL (ref 24–336)

## 2017-02-17 LAB — PARATHYROID HORMONE, INTACT (NO CA): PTH: 32 pg/mL (ref 15–65)

## 2017-02-17 LAB — HEPATITIS C ANTIBODY

## 2017-02-17 LAB — PTH, INTACT AND CALCIUM
Calcium, Total (PTH): 9.2 mg/dL (ref 8.6–10.2)
PTH: 38 pg/mL (ref 15–65)

## 2017-02-17 LAB — KAPPA/LAMBDA LIGHT CHAINS
Kappa free light chain: 18 mg/L (ref 3.3–19.4)
Kappa, lambda light chain ratio: 1.16 (ref 0.26–1.65)
LAMDA FREE LIGHT CHAINS: 15.5 mg/L (ref 5.7–26.3)

## 2017-02-17 LAB — HEMOGLOBIN A1C
HEMOGLOBIN A1C: 5.8 % — AB (ref 4.8–5.6)
Mean Plasma Glucose: 120 mg/dL

## 2017-02-17 LAB — ANTINUCLEAR ANTIBODIES, IFA: ANA Ab, IFA: NEGATIVE

## 2017-02-17 LAB — C3 COMPLEMENT: C3 Complement: 119 mg/dL (ref 82–167)

## 2017-02-17 LAB — HEPATITIS B SURFACE ANTIGEN: HEP B S AG: NEGATIVE

## 2017-02-17 LAB — VITAMIN D 25 HYDROXY (VIT D DEFICIENCY, FRACTURES): VIT D 25 HYDROXY: 19.3 ng/mL — AB (ref 30.0–100.0)

## 2017-02-17 LAB — C4 COMPLEMENT: COMPLEMENT C4, BODY FLUID: 37 mg/dL (ref 14–44)

## 2017-02-18 LAB — ANCA TITERS
Atypical P-ANCA titer: <1:20 {titer}
P-ANCA: <1:20 {titer}

## 2017-02-18 LAB — COMPLEMENT, TOTAL: Compl, Total (CH50): 60 U/mL (ref >41)

## 2017-02-19 LAB — PROTEIN ELECTROPHORESIS, SERUM
A/G RATIO SPE: 1.2 (ref 0.7–1.7)
ALBUMIN ELP: 3.8 g/dL (ref 2.9–4.4)
ALPHA-1-GLOBULIN: 0.2 g/dL (ref 0.0–0.4)
ALPHA-2-GLOBULIN: 0.6 g/dL (ref 0.4–1.0)
BETA GLOBULIN: 1.1 g/dL (ref 0.7–1.3)
Gamma Globulin: 1.2 g/dL (ref 0.4–1.8)
Globulin, Total: 3.1 g/dL (ref 2.2–3.9)
Total Protein ELP: 6.9 g/dL (ref 6.0–8.5)

## 2017-02-19 LAB — MPO/PR-3 (ANCA) ANTIBODIES: Myeloperoxidase Abs: <9 U/mL (ref 0.0–9.0)

## 2017-02-20 LAB — IMMUNOFIXATION ELECTROPHORESIS
IGA: 104 mg/dL (ref 61–437)
IGG (IMMUNOGLOBIN G), SERUM: 1070 mg/dL (ref 700–1600)
IgM, Serum: 57 mg/dL (ref 20–172)
Total Protein ELP: 7 g/dL (ref 6.0–8.5)

## 2017-03-02 ENCOUNTER — Other Ambulatory Visit (HOSPITAL_COMMUNITY)
Admission: RE | Admit: 2017-03-02 | Discharge: 2017-03-02 | Disposition: A | Discharge status: Home / Self Care | Payer: No Typology Code available for payment source | Source: Ambulatory Visit | Attending: Physician Assistant | Admitting: Physician Assistant

## 2017-03-02 ENCOUNTER — Ambulatory Visit (HOSPITAL_COMMUNITY)
Admission: RE | Admit: 2017-03-02 | Discharge: 2017-03-02 | Disposition: A | Discharge status: Home / Self Care | Payer: Self-pay | Source: Ambulatory Visit | Attending: Physician Assistant | Admitting: Physician Assistant

## 2017-03-02 DIAGNOSIS — N183 Chronic kidney disease, stage 3 (moderate): Secondary | ICD-10-CM | POA: Insufficient documentation

## 2017-03-02 DIAGNOSIS — N281 Cyst of kidney, acquired: Secondary | ICD-10-CM | POA: Insufficient documentation

## 2017-03-02 DIAGNOSIS — D509 Iron deficiency anemia, unspecified: Secondary | ICD-10-CM | POA: Insufficient documentation

## 2017-03-02 DIAGNOSIS — E785 Hyperlipidemia, unspecified: Secondary | ICD-10-CM | POA: Insufficient documentation

## 2017-03-02 DIAGNOSIS — N400 Enlarged prostate without lower urinary tract symptoms: Secondary | ICD-10-CM | POA: Insufficient documentation

## 2017-03-02 DIAGNOSIS — N189 Chronic kidney disease, unspecified: Secondary | ICD-10-CM | POA: Insufficient documentation

## 2017-03-02 LAB — CREATININE, URINE, 24 HOUR
CREATININE 24H UR: 18 mg/d — AB (ref 800–2000)
Collection Interval-UCRE24: 2360 hours
Creatinine, Urine: 75.06 mg/dL
URINE TOTAL VOLUME-UCRE24: 2360 mL

## 2017-03-08 ENCOUNTER — Ambulatory Visit: Payer: Self-pay | Admitting: Physician Assistant

## 2017-03-18 ENCOUNTER — Encounter: Payer: Self-pay | Admitting: Physician Assistant

## 2018-12-21 ENCOUNTER — Inpatient Hospital Stay (HOSPITAL_COMMUNITY)
Admission: EM | Admit: 2018-12-21 | Discharge: 2018-12-23 | DRG: 291 | Disposition: A | Discharge status: Home / Self Care | Payer: Medicare Other | Attending: Internal Medicine | Admitting: Internal Medicine

## 2018-12-21 ENCOUNTER — Encounter (HOSPITAL_COMMUNITY): Payer: Self-pay

## 2018-12-21 ENCOUNTER — Inpatient Hospital Stay (HOSPITAL_COMMUNITY): Payer: Medicare Other

## 2018-12-21 ENCOUNTER — Other Ambulatory Visit: Payer: Self-pay

## 2018-12-21 ENCOUNTER — Emergency Department (HOSPITAL_COMMUNITY): Payer: Medicare Other

## 2018-12-21 DIAGNOSIS — I440 Atrioventricular block, first degree: Secondary | ICD-10-CM | POA: Diagnosis present

## 2018-12-21 DIAGNOSIS — I160 Hypertensive urgency: Secondary | ICD-10-CM

## 2018-12-21 DIAGNOSIS — I5021 Acute systolic (congestive) heart failure: Secondary | ICD-10-CM | POA: Diagnosis present

## 2018-12-21 DIAGNOSIS — N189 Chronic kidney disease, unspecified: Secondary | ICD-10-CM | POA: Diagnosis present

## 2018-12-21 DIAGNOSIS — I10 Essential (primary) hypertension: Secondary | ICD-10-CM | POA: Diagnosis not present

## 2018-12-21 DIAGNOSIS — Z79899 Other long term (current) drug therapy: Secondary | ICD-10-CM | POA: Diagnosis not present

## 2018-12-21 DIAGNOSIS — Z7982 Long term (current) use of aspirin: Secondary | ICD-10-CM

## 2018-12-21 DIAGNOSIS — F1010 Alcohol abuse, uncomplicated: Secondary | ICD-10-CM | POA: Diagnosis not present

## 2018-12-21 DIAGNOSIS — N179 Acute kidney failure, unspecified: Secondary | ICD-10-CM | POA: Diagnosis present

## 2018-12-21 DIAGNOSIS — I248 Other forms of acute ischemic heart disease: Secondary | ICD-10-CM | POA: Diagnosis present

## 2018-12-21 DIAGNOSIS — I483 Typical atrial flutter: Secondary | ICD-10-CM | POA: Diagnosis present

## 2018-12-21 DIAGNOSIS — Y900 Blood alcohol level of less than 20 mg/100 ml: Secondary | ICD-10-CM | POA: Diagnosis present

## 2018-12-21 DIAGNOSIS — Z87891 Personal history of nicotine dependence: Secondary | ICD-10-CM

## 2018-12-21 DIAGNOSIS — Z6833 Body mass index (BMI) 33.0-33.9, adult: Secondary | ICD-10-CM

## 2018-12-21 DIAGNOSIS — Z20828 Contact with and (suspected) exposure to other viral communicable diseases: Secondary | ICD-10-CM | POA: Diagnosis present

## 2018-12-21 DIAGNOSIS — Z23 Encounter for immunization: Secondary | ICD-10-CM

## 2018-12-21 DIAGNOSIS — I4892 Unspecified atrial flutter: Secondary | ICD-10-CM

## 2018-12-21 DIAGNOSIS — F10239 Alcohol dependence with withdrawal, unspecified: Secondary | ICD-10-CM | POA: Diagnosis present

## 2018-12-21 DIAGNOSIS — N182 Chronic kidney disease, stage 2 (mild): Secondary | ICD-10-CM | POA: Diagnosis not present

## 2018-12-21 DIAGNOSIS — I130 Hypertensive heart and chronic kidney disease with heart failure and stage 1 through stage 4 chronic kidney disease, or unspecified chronic kidney disease: Secondary | ICD-10-CM | POA: Diagnosis present

## 2018-12-21 DIAGNOSIS — I429 Cardiomyopathy, unspecified: Secondary | ICD-10-CM | POA: Diagnosis present

## 2018-12-21 DIAGNOSIS — E782 Mixed hyperlipidemia: Secondary | ICD-10-CM | POA: Diagnosis present

## 2018-12-21 DIAGNOSIS — E669 Obesity, unspecified: Secondary | ICD-10-CM | POA: Diagnosis present

## 2018-12-21 DIAGNOSIS — I361 Nonrheumatic tricuspid (valve) insufficiency: Secondary | ICD-10-CM

## 2018-12-21 DIAGNOSIS — R42 Dizziness and giddiness: Secondary | ICD-10-CM

## 2018-12-21 DIAGNOSIS — Z9114 Patient's other noncompliance with medication regimen: Secondary | ICD-10-CM

## 2018-12-21 DIAGNOSIS — J810 Acute pulmonary edema: Secondary | ICD-10-CM | POA: Diagnosis not present

## 2018-12-21 DIAGNOSIS — Z8679 Personal history of other diseases of the circulatory system: Secondary | ICD-10-CM

## 2018-12-21 DIAGNOSIS — N183 Chronic kidney disease, stage 3 unspecified: Secondary | ICD-10-CM

## 2018-12-21 DIAGNOSIS — Z91199 Patient's noncompliance with other medical treatment and regimen due to unspecified reason: Secondary | ICD-10-CM

## 2018-12-21 DIAGNOSIS — Z833 Family history of diabetes mellitus: Secondary | ICD-10-CM

## 2018-12-21 DIAGNOSIS — Z9119 Patient's noncompliance with other medical treatment and regimen: Secondary | ICD-10-CM | POA: Diagnosis not present

## 2018-12-21 DIAGNOSIS — Z8249 Family history of ischemic heart disease and other diseases of the circulatory system: Secondary | ICD-10-CM | POA: Diagnosis not present

## 2018-12-21 DIAGNOSIS — F10939 Alcohol use, unspecified with withdrawal, unspecified: Secondary | ICD-10-CM

## 2018-12-21 DIAGNOSIS — R778 Other specified abnormalities of plasma proteins: Secondary | ICD-10-CM

## 2018-12-21 DIAGNOSIS — I161 Hypertensive emergency: Secondary | ICD-10-CM | POA: Diagnosis present

## 2018-12-21 DIAGNOSIS — Z8042 Family history of malignant neoplasm of prostate: Secondary | ICD-10-CM | POA: Diagnosis not present

## 2018-12-21 DIAGNOSIS — I340 Nonrheumatic mitral (valve) insufficiency: Secondary | ICD-10-CM

## 2018-12-21 HISTORY — DX: Chronic kidney disease, stage 3 unspecified: N18.30

## 2018-12-21 HISTORY — DX: Hyperlipidemia, unspecified: E78.5

## 2018-12-21 HISTORY — DX: Patient's noncompliance with other medical treatment and regimen due to unspecified reason: Z91.199

## 2018-12-21 HISTORY — DX: Patient's noncompliance with other medical treatment and regimen: Z91.19

## 2018-12-21 HISTORY — DX: Alcohol abuse, uncomplicated: F10.10

## 2018-12-21 LAB — CBC
HCT: 51.6 % (ref 39.0–52.0)
Hemoglobin: 16.5 g/dL (ref 13.0–17.0)
MCH: 26.3 pg (ref 26.0–34.0)
MCHC: 32 g/dL (ref 30.0–36.0)
MCV: 82.2 fL (ref 80.0–100.0)
Platelets: 222 10*3/uL (ref 150–400)
RBC: 6.28 MIL/uL — ABNORMAL HIGH (ref 4.22–5.81)
RDW: 16.2 % — ABNORMAL HIGH (ref 11.5–15.5)
WBC: 6.5 10*3/uL (ref 4.0–10.5)
nRBC: 0 % (ref 0.0–0.2)

## 2018-12-21 LAB — PROTIME-INR
INR: 1.2 (ref 0.8–1.2)
Prothrombin Time: 15.1 seconds (ref 11.4–15.2)

## 2018-12-21 LAB — DIFFERENTIAL
Abs Immature Granulocytes: 0.01 10*3/uL (ref 0.00–0.07)
Basophils Absolute: 0.1 10*3/uL (ref 0.0–0.1)
Basophils Relative: 1 %
Eosinophils Absolute: 0.2 10*3/uL (ref 0.0–0.5)
Eosinophils Relative: 3 %
Immature Granulocytes: 0 %
Lymphocytes Relative: 28 %
Lymphs Abs: 1.8 10*3/uL (ref 0.7–4.0)
Monocytes Absolute: 0.6 10*3/uL (ref 0.1–1.0)
Monocytes Relative: 10 %
Neutro Abs: 3.8 10*3/uL (ref 1.7–7.7)
Neutrophils Relative %: 58 %

## 2018-12-21 LAB — RAPID URINE DRUG SCREEN, HOSP PERFORMED
Amphetamines: NOT DETECTED
Barbiturates: NOT DETECTED
Benzodiazepines: NOT DETECTED
Cocaine: NOT DETECTED
Opiates: NOT DETECTED
Tetrahydrocannabinol: NOT DETECTED

## 2018-12-21 LAB — ECHOCARDIOGRAM COMPLETE
Height: 66 in
Weight: 3276.92 oz

## 2018-12-21 LAB — TROPONIN I
Troponin I: 0.08 ng/mL (ref <0.03)
Troponin I: 0.07 ng/mL (ref <0.03)
Troponin I: 0.07 ng/mL (ref <0.03)

## 2018-12-21 LAB — COMPREHENSIVE METABOLIC PANEL
ALT: 56 U/L — ABNORMAL HIGH (ref 0–44)
AST: 42 U/L — ABNORMAL HIGH (ref 15–41)
Albumin: 4.2 g/dL (ref 3.5–5.0)
Alkaline Phosphatase: 103 U/L (ref 38–126)
Anion gap: 10 (ref 5–15)
BUN: 28 mg/dL — ABNORMAL HIGH (ref 8–23)
CO2: 22 mmol/L (ref 22–32)
Calcium: 8.9 mg/dL (ref 8.9–10.3)
Chloride: 108 mmol/L (ref 98–111)
Creatinine, Ser: 2.04 mg/dL — ABNORMAL HIGH (ref 0.61–1.24)
GFR calc Af Amer: 38 mL/min — ABNORMAL LOW (ref >60)
GFR calc non Af Amer: 33 mL/min — ABNORMAL LOW (ref >60)
Glucose, Bld: 155 mg/dL — ABNORMAL HIGH (ref 70–99)
Potassium: 3.7 mmol/L (ref 3.5–5.1)
Sodium: 140 mmol/L (ref 135–145)
Total Bilirubin: 1.4 mg/dL — ABNORMAL HIGH (ref 0.3–1.2)
Total Protein: 7.1 g/dL (ref 6.5–8.1)

## 2018-12-21 LAB — HEPARIN LEVEL (UNFRACTIONATED): Heparin Unfractionated: 0.42 IU/mL (ref 0.30–0.70)

## 2018-12-21 LAB — URINALYSIS, ROUTINE W REFLEX MICROSCOPIC
Bilirubin Urine: NEGATIVE
Glucose, UA: NEGATIVE mg/dL
Ketones, ur: NEGATIVE mg/dL
Leukocytes,Ua: NEGATIVE
Nitrite: NEGATIVE
Protein, ur: 100 mg/dL — AB
Specific Gravity, Urine: 1.02 (ref 1.005–1.030)
pH: 5 (ref 5.0–8.0)

## 2018-12-21 LAB — ETHANOL: Alcohol, Ethyl (B): <10 mg/dL (ref <10)

## 2018-12-21 LAB — TSH: TSH: 0.713 u[IU]/mL (ref 0.350–4.500)

## 2018-12-21 LAB — MRSA PCR SCREENING: MRSA by PCR: NEGATIVE

## 2018-12-21 LAB — BRAIN NATRIURETIC PEPTIDE: B Natriuretic Peptide: 978 pg/mL — ABNORMAL HIGH (ref 0.0–100.0)

## 2018-12-21 LAB — APTT: aPTT: 37 seconds — ABNORMAL HIGH (ref 24–36)

## 2018-12-21 LAB — SARS CORONAVIRUS 2 BY RT PCR (HOSPITAL ORDER, PERFORMED IN ~~LOC~~ HOSPITAL LAB): SARS Coronavirus 2: NEGATIVE

## 2018-12-21 MED ORDER — HYDROCHLOROTHIAZIDE 25 MG PO TABS
25.0000 mg | ORAL_TABLET | Freq: Every day | ORAL | Status: DC
  Filled 2018-12-21: qty 1

## 2018-12-21 MED ORDER — SODIUM CHLORIDE 0.9% FLUSH
3.0000 mL | Freq: Two times a day (BID) | INTRAVENOUS | Status: DC
  Administered 2018-12-21 – 2018-12-23 (×4): 3 mL via INTRAVENOUS

## 2018-12-21 MED ORDER — METOPROLOL TARTRATE 50 MG PO TABS
100.0000 mg | ORAL_TABLET | Freq: Two times a day (BID) | ORAL | Status: DC
  Administered 2018-12-21 (×2): 100 mg via ORAL
  Filled 2018-12-21 (×2): qty 2

## 2018-12-21 MED ORDER — CLONIDINE HCL 0.2 MG PO TABS: 0.2000 mg | ORAL_TABLET | Freq: Two times a day (BID) | ORAL | Status: DC

## 2018-12-21 MED ORDER — PRAVASTATIN SODIUM 10 MG PO TABS
10.0000 mg | ORAL_TABLET | Freq: Every day | ORAL | Status: DC
  Administered 2018-12-21 – 2018-12-22 (×2): 10 mg via ORAL
  Filled 2018-12-21 (×2): qty 1

## 2018-12-21 MED ORDER — ACETAMINOPHEN 325 MG PO TABS: 650.0000 mg | ORAL_TABLET | Freq: Four times a day (QID) | ORAL | Status: DC | PRN

## 2018-12-21 MED ORDER — HYDRALAZINE HCL 25 MG PO TABS
50.0000 mg | ORAL_TABLET | Freq: Three times a day (TID) | ORAL | Status: DC
  Administered 2018-12-21 – 2018-12-22 (×3): 50 mg via ORAL
  Filled 2018-12-21 (×3): qty 2

## 2018-12-21 MED ORDER — DEXTROSE 5 % IV SOLN
5.0000 mg/h | INTRAVENOUS | Status: DC
  Administered 2018-12-21: 5 mg/h via INTRAVENOUS
  Filled 2018-12-21: qty 100

## 2018-12-21 MED ORDER — FOLIC ACID 1 MG PO TABS
1.0000 mg | ORAL_TABLET | Freq: Every day | ORAL | Status: DC
  Administered 2018-12-21 – 2018-12-23 (×3): 1 mg via ORAL
  Filled 2018-12-21 (×3): qty 1

## 2018-12-21 MED ORDER — DILTIAZEM LOAD VIA INFUSION
10.0000 mg | Freq: Once | INTRAVENOUS | Status: AC
  Administered 2018-12-21: 10 mg via INTRAVENOUS
  Filled 2018-12-21: qty 10

## 2018-12-21 MED ORDER — LISINOPRIL 10 MG PO TABS: 40.0000 mg | ORAL_TABLET | Freq: Every day | ORAL | Status: DC

## 2018-12-21 MED ORDER — HYDRALAZINE HCL 20 MG/ML IJ SOLN: 10.0000 mg | INTRAMUSCULAR | Status: DC | PRN

## 2018-12-21 MED ORDER — LISINOPRIL-HYDROCHLOROTHIAZIDE 20-12.5 MG PO TABS: 2.0000 | ORAL_TABLET | Freq: Every day | ORAL | Status: DC

## 2018-12-21 MED ORDER — SODIUM CHLORIDE 0.9 % IV SOLN: 250.0000 mL | INTRAVENOUS | Status: DC | PRN

## 2018-12-21 MED ORDER — ONDANSETRON HCL 4 MG PO TABS: 4.0000 mg | ORAL_TABLET | Freq: Four times a day (QID) | ORAL | Status: DC | PRN

## 2018-12-21 MED ORDER — THIAMINE HCL 100 MG/ML IJ SOLN: 100.0000 mg | Freq: Every day | INTRAMUSCULAR | Status: DC

## 2018-12-21 MED ORDER — LORAZEPAM 1 MG PO TABS: 1.0000 mg | ORAL_TABLET | Freq: Four times a day (QID) | ORAL | Status: DC | PRN

## 2018-12-21 MED ORDER — ADULT MULTIVITAMIN W/MINERALS CH
1.0000 | ORAL_TABLET | Freq: Every day | ORAL | Status: DC
  Administered 2018-12-21 – 2018-12-23 (×3): 1 via ORAL
  Filled 2018-12-21 (×3): qty 1

## 2018-12-21 MED ORDER — AMLODIPINE BESYLATE 5 MG PO TABS: 10.0000 mg | ORAL_TABLET | Freq: Every day | ORAL | Status: DC

## 2018-12-21 MED ORDER — FUROSEMIDE 10 MG/ML IJ SOLN
40.0000 mg | Freq: Once | INTRAMUSCULAR | Status: AC
  Administered 2018-12-21: 40 mg via INTRAVENOUS
  Filled 2018-12-21: qty 4

## 2018-12-21 MED ORDER — PNEUMOCOCCAL VAC POLYVALENT 25 MCG/0.5ML IJ INJ
0.5000 mL | INJECTION | INTRAMUSCULAR | Status: AC
  Administered 2018-12-22: 0.5 mL via INTRAMUSCULAR
  Filled 2018-12-21: qty 0.5

## 2018-12-21 MED ORDER — ACETAMINOPHEN 650 MG RE SUPP: 650.0000 mg | Freq: Four times a day (QID) | RECTAL | Status: DC | PRN

## 2018-12-21 MED ORDER — HEPARIN BOLUS VIA INFUSION
4000.0000 [IU] | Freq: Once | INTRAVENOUS | Status: AC
  Administered 2018-12-21: 4000 [IU] via INTRAVENOUS
  Filled 2018-12-21: qty 4000

## 2018-12-21 MED ORDER — LORAZEPAM 2 MG/ML IJ SOLN
1.0000 mg | Freq: Four times a day (QID) | INTRAMUSCULAR | Status: DC | PRN
  Administered 2018-12-21: 1 mg via INTRAVENOUS
  Filled 2018-12-21: qty 1

## 2018-12-21 MED ORDER — SODIUM CHLORIDE 0.9% FLUSH
3.0000 mL | INTRAVENOUS | Status: DC | PRN
  Administered 2018-12-21: 3 mL via INTRAVENOUS
  Filled 2018-12-21: qty 3

## 2018-12-21 MED ORDER — HYDROCHLOROTHIAZIDE 25 MG PO TABS: 25.0000 mg | ORAL_TABLET | Freq: Two times a day (BID) | ORAL | Status: DC

## 2018-12-21 MED ORDER — ONDANSETRON HCL 4 MG/2ML IJ SOLN: 4.0000 mg | Freq: Four times a day (QID) | INTRAMUSCULAR | Status: DC | PRN

## 2018-12-21 MED ORDER — HEPARIN (PORCINE) 25000 UT/250ML-% IV SOLN
1350.0000 [IU]/h | INTRAVENOUS | Status: DC
  Administered 2018-12-21 – 2018-12-22 (×2): 1200 [IU]/h via INTRAVENOUS
  Filled 2018-12-21 (×2): qty 250

## 2018-12-21 MED ORDER — ASPIRIN EC 81 MG PO TBEC
81.0000 mg | DELAYED_RELEASE_TABLET | Freq: Every day | ORAL | Status: DC
  Administered 2018-12-21 – 2018-12-23 (×3): 81 mg via ORAL
  Filled 2018-12-21 (×3): qty 1

## 2018-12-21 MED ORDER — VITAMIN B-1 100 MG PO TABS
100.0000 mg | ORAL_TABLET | Freq: Every day | ORAL | Status: DC
  Administered 2018-12-21 – 2018-12-23 (×3): 100 mg via ORAL
  Filled 2018-12-21 (×3): qty 1

## 2018-12-21 NOTE — Progress Notes (Signed)
Cardizem drip stopped at 1613 due to patient heart rate being in the 60's with the occasional dip into the 50's. Dr. Cathlean Sauer aware. Since being off the cardizem drip, patient heart rate has been remained below 90. Dr. Cathlean Sauer aware of all heart rates since cardizem drip stopped. Patient noted to be tachypnea upon exertion with O2 sats remaining in the 90's on room air. 2L [place] O2 placed on patient for comfort with respirations. Patient tolerated PO meds well. At around 1515, Patient had one episode where he became diaphoretic with tremors being felt and pt complaining of feeling nauseous (no vomiting). Patient denied any chest pain or discomfort or feeling short of breath. CIWA scoring of 10, pt stated his last drink of alcohol was last night. Dr. Cathlean Sauer aware.

## 2018-12-21 NOTE — Progress Notes (Signed)
ANTICOAGULATION CONSULT NOTE - Initial Consult  Pharmacy Consult for heparin Indication: atrial fibrillation  No Known Allergies  Patient Measurements: Height: 5\' 6"  (167.6 cm) Weight: 204 lb 12.9 oz (92.9 kg) IBW/kg (Calculated) : 63.8 Heparin Dosing Weight: 83 kg  Vital Signs: Temp: 97.8 F (36.6 C) (05/20 1239) Temp Source: Oral (05/20 1239) BP: 187/130 (05/20 1243) Pulse Rate: 43 (05/20 1243)  Labs: Recent Labs    12/21/18 0854  HGB 16.5  HCT 51.6  PLT 222  APTT 37*  LABPROT 15.1  INR 1.2  CREATININE 2.04*  TROPONINI 0.08*    Estimated Creatinine Clearance: 38.5 mL/min (A) (by C-G formula based on SCr of 2.04 mg/dL (H)).   Medical History: Past Medical History:  Diagnosis Date  . CKD (chronic kidney disease)   . Hyperlipidemia   . Hypertension 2015  . Noncompliance     Medications:  Medications Prior to Admission  Medication Sig Dispense Refill Last Dose  . amLODipine (NORVASC) 10 MG tablet Take 1 tablet (10 mg total) by mouth daily. 30 tablet 1   . aspirin EC 81 MG tablet Take 81 mg by mouth daily. Reported on 10/29/2015   Taking  . cloNIDine (CATAPRES) 0.2 MG tablet Take 1 tablet (0.2 mg total) by mouth 2 (two) times daily. 60 tablet 1   . lisinopril-hydrochlorothiazide (ZESTORETIC) 20-12.5 MG tablet Take 2 tablets by mouth daily. 60 tablet 1   . lovastatin (MEVACOR) 20 MG tablet Take 1 tablet (20 mg total) by mouth at bedtime. 30 tablet 1   . metoprolol tartrate (LOPRESSOR) 100 MG tablet Take 1 tablet (100 mg total) by mouth 2 (two) times daily. 60 tablet 1     Assessment: Pharmacy consulted to dose heparin in patient with atrial fibrillation.  Patient not on anticoagulation prior to admission.  Goal of Therapy:  Heparin level 0.3-0.7 units/ml Monitor platelets by anticoagulation protocol: Yes   Plan:  Give 4000 units bolus x 1 Start heparin infusion at 1200 units/hr Check anti-Xa level in 6-8 hours and daily while on heparin Continue to  monitor H&H and platelets  Revonda Standard Clarrissa Shimkus 12/21/2018,1:09 PM

## 2018-12-21 NOTE — ED Provider Notes (Signed)
[facility] EMERGENCY DEPARTMENT Provider Note   CSN: [id number] Arrival date & time: 12/21/18  0815    History   Chief Complaint Chief Complaint  Patient presents with  . Shortness of Breath  . Dizziness    HPI Timothy [name] is a 63 y.o. male.     HPI  Pt was seen at [place]. Per pt, c/o gradual onset and persistence of constant "dizziness" for the past 2 days. Has been associated with SOB. Pt describes the dizziness as "everything is moving around." Pt states he has not taken any of his medications or seen a medical provider in "at least" the past 2 years. Denies known COVID+ exposures. Denies CP/palpitations, no cough, no fevers, no abd pain, no N/V/D, no visual changes, no focal motor weakness, no tingling/numbness in extremities, no slurred speech, no facial droop.    Past Medical History:  Diagnosis Date  . CKD (chronic kidney disease)   . Hyperlipidemia   . Hypertension 2015  . Noncompliance     Patient Active Problem List   Diagnosis Date Noted  . Essential hypertension, benign 10/29/2015  . Hyperlipidemia 10/29/2015  . Obesity 10/29/2015  . CKD (chronic kidney disease) 10/29/2015    History reviewed. No pertinent surgical history.      Home Medications    Prior to Admission medications   Medication Sig Start Date End Date Taking? Authorizing Provider  amLODipine (NORVASC) 10 MG tablet Take 1 tablet (10 mg total) by mouth daily. 02/10/17   Soyla Dryer, PA-C  aspirin EC 81 MG tablet Take 81 mg by mouth daily. Reported on 10/29/2015    [provider]  cloNIDine (CATAPRES) 0.2 MG tablet Take 1 tablet (0.2 mg total) by mouth 2 (two) times daily. 02/10/17   Soyla Dryer, PA-C  lisinopril-hydrochlorothiazide (ZESTORETIC) 20-12.5 MG tablet Take 2 tablets by mouth daily. 02/10/17   Soyla Dryer, PA-C  lovastatin (MEVACOR) 20 MG tablet Take 1 tablet (20 mg total) by mouth at bedtime. 02/10/17   Soyla Dryer, PA-C  metoprolol tartrate  (LOPRESSOR) 100 MG tablet Take 1 tablet (100 mg total) by mouth 2 (two) times daily. 02/10/17   Soyla Dryer, PA-C    Family History Family History  Problem Relation Age of Onset  . Diabetes Mother   . Hypertension Mother   . Cancer Father        Prostate Cancer  . Hypertension Father   . Diabetes Sister   . Diabetes Brother   . Hypertension Brother   . Hypertension Brother     Social History Social History   Tobacco Use  . Smoking status: Former Smoker    Types: Cigars  . Smokeless tobacco: Never Used  Substance Use Topics  . Alcohol use: Yes    Alcohol/week: 7.0 standard drinks    Types: 7 Cans of beer per week    Comment: daily- says quit last week  . Drug use: No     Allergies   Patient has no known allergies.   Review of Systems Review of Systems ROS: Statement: All systems negative except as marked or noted in the HPI; Constitutional: Negative for fever and chills. ; ; Eyes: Negative for eye pain, redness and discharge. ; ; ENMT: Negative for ear pain, hoarseness, nasal congestion, sinus pressure and sore throat. ; ; Cardiovascular: Negative for chest pain, palpitations, diaphoresis, and peripheral edema. ; ; Respiratory: +SOB. Negative for cough, wheezing and stridor. ; ; Gastrointestinal: Negative for nausea, vomiting, diarrhea, abdominal pain, blood in stool,  hematemesis, jaundice and rectal bleeding. . ; ; Genitourinary: Negative for dysuria, flank pain and hematuria. ; ; Musculoskeletal: Negative for back pain and neck pain. Negative for swelling and trauma.; ; Skin: Negative for pruritus, rash, abrasions, blisters, bruising and skin lesion.; ; Neuro: +"dizziness." Negative for headache, lightheadedness and neck stiffness. Negative for weakness, altered level of consciousness, altered mental status, extremity weakness, paresthesias, involuntary movement, seizure and syncope.       Physical Exam Updated Vital Signs BP (!) 213/121   Pulse (!) 59   Temp 98  F (36.7 C) (Oral)   Resp (!) 31   Ht 5\' 6"  (1.676 m)   Wt 90.7 kg   SpO2 98%   BMI 32.28 kg/m    Patient Vitals for the past 24 hrs:  BP Temp Temp src Pulse Resp SpO2 Height Weight  12/21/18 1000 (!) 193/118 - - 62 - 99 % - -  12/21/18 0945 - - - (!) 59 (!) 31 98 % - -  12/21/18 0930 (!) 213/121 - - (!) 44 (!) 32 99 % - -  12/21/18 0917 (!) 187/143 98 F (36.7 C) Oral 67 20 97 % - -  12/21/18 0915 (!) 187/143 - - - (!) 30 - - -  12/21/18 0910 - - - - - 99 % - -  12/21/18 0900 (!) 237/137 - - (!) 53 (!) 26 99 % - -  12/21/18 0845 - - - 60 (!) 25 99 % - -  12/21/18 0832 (!) 211/140 - - - - 99 % - -  12/21/18 0830 (!) 211/140 - - - (!) 26 - - -  12/21/18 0828 - (!) 97.5 F (36.4 C) Oral (!) 114 (!) 26 - - -  12/21/18 0825 - - - - - - 5\' 6"  (1.676 m) 90.7 kg   08:44 Orthostatic Vital Signs VP  Orthostatic Lying   BP- Lying: 211/163Abnormal   Pulse- Lying: 120      Orthostatic Sitting  BP- Sitting: 207/168Abnormal   Pulse- Sitting: 119      Orthostatic Standing at 0 minutes  BP- Standing at 0 minutes: 217/147Abnormal   Pulse- Standing at 0 minutes: 130     Physical Exam 0850: Physical examination:  Nursing notes reviewed; Vital signs and O2 SAT reviewed;  Constitutional: Well developed, Well nourished, Well hydrated, In no acute distress; Head:  Normocephalic, atraumatic; Eyes: EOMI, PERRL, No scleral icterus; ENMT: Mouth and pharynx normal, Mucous membranes moist; Neck: Supple, Full range of motion, No lymphadenopathy; Cardiovascular: HR 130-140's during exam.Tachycardic, irregular rate and rhythm, No gallop; Respiratory: Breath sounds clear & equal bilaterally, No wheezes.  Speaking full sentences with ease, Normal respiratory effort/excursion; Chest: Nontender, Movement normal; Abdomen: Soft, Nontender, Nondistended, Normal bowel sounds; Genitourinary: No CVA tenderness; Extremities: Peripheral pulses normal, No tenderness, No edema, No calf edema or asymmetry.; Neuro:  AA&Ox3, Major CN grossly intact. Speech clear.  No facial droop.  +left and right horizontal end gaze nystagmus. Grips equal. Strength 5/5 equal bilat UE's and LE's.  DTR 2/4 equal bilat UE's and LE's.  No gross sensory deficits.  Normal cerebellar testing bilat UE's (finger-nose) and LE's (heel-shin).; Skin: Color normal, Warm, Dry.      ED Treatments / Results  Labs (all labs ordered are listed, but only abnormal results are displayed)   EKG EKG Interpretation  Date/Time:  Wednesday Dec 21 2018 08:28:45 EDT   #1 Ventricular Rate:  127 PR Interval:    QRS Duration: 102 QT Interval:  345 QTC Calculation: 502 R Axis:   7 Text Interpretation:  Atrial flutter with 2:1 AV block Abnormal R-wave progression, late transition Probable left ventricular hypertrophy ST elevation, consider anterior injury Prolonged QT interval When compared with ECG of 02/16/2017 Atrial flutter has replaced Normal sinus rhythm Nonspecific ST and T wave abnormality is now Present Confirmed by Francine Graven [phone]) on 12/21/2018 9:19:33 AM    EKG Interpretation  Date/Time:  Wednesday Dec 21 2018 10:33:04 EDT   #2 Ventricular Rate:  92 PR Interval:    QRS Duration: 113 QT Interval:  407 QTC Calculation: 455 R Axis:   -23 Text Interpretation:  Atrial flutter Abnormal R-wave progression, late transition Probable left ventricular hypertrophy Abnormal T, consider ischemia, lateral leads Since last tracing of earlier today Rate slower Confirmed by Francine Graven [phone]) on 12/21/2018 10:37:46 AM        Radiology   Procedures Procedures (including critical care time)  Medications Ordered in ED Medications  diltiazem (CARDIZEM) 1 mg/mL load via infusion 10 mg (10 mg Intravenous Bolus from Bag 12/21/18 0917)    And  diltiazem (CARDIZEM) 100 mg in dextrose 5 % 100 mL (1 mg/mL) infusion (5 mg/hr Intravenous New Bag/Given 12/21/18 0916)     Initial Impression / Assessment and Plan / ED Course  I have  reviewed the triage vital signs and the nursing notes.  Pertinent labs & imaging results that were available during my care of the patient were reviewed by me and considered in my medical decision making (see chart for details).     MDM Reviewed: previous chart, nursing note and vitals Reviewed previous: labs and ECG Interpretation: labs, ECG, x-ray and CT scan Total time providing critical care: 30-74 minutes. This excludes time spent performing separately reportable procedures and services.   CRITICAL CARE Performed by: Francine Graven Total critical care time: 45 minutes Critical care time was exclusive of separately billable procedures and treating other patients. Critical care was necessary to treat or prevent imminent or life-threatening deterioration. Critical care was time spent personally by me on the following activities: development of treatment plan with patient and/or surrogate as well as nursing, discussions with consultants, evaluation of patient's response to treatment, examination of patient, obtaining history from patient or surrogate, ordering and performing treatments and interventions, ordering and review of laboratory studies, ordering and review of radiographic studies, pulse oximetry and re-evaluation of patient's condition.   Results for orders placed or performed during the hospital encounter of 12/21/18  SARS Coronavirus 2 (CEPHEID - Performed in [place] hospital lab), [facility] Order  Result Value Ref Range   SARS Coronavirus 2 NEGATIVE NEGATIVE  Ethanol  Result Value Ref Range   Alcohol, Ethyl (B) <10 <10 mg/dL  Protime-INR  Result Value Ref Range   Prothrombin Time 15.1 11.4 - 15.2 seconds   INR 1.2 0.8 - 1.2  APTT  Result Value Ref Range   aPTT 37 (H) 24 - 36 seconds  CBC  Result Value Ref Range   WBC 6.5 4.0 - 10.5 K/uL   RBC 6.28 (H) 4.22 - 5.81 MIL/uL   Hemoglobin 16.5 13.0 - 17.0 g/dL   HCT 51.6 39.0 - 52.0 %   MCV 82.2 80.0 - 100.0 fL    MCH 26.3 26.0 - 34.0 pg   MCHC 32.0 30.0 - 36.0 g/dL   RDW 16.2 (H) 11.5 - 15.5 %   Platelets 222 150 - 400 K/uL   nRBC 0.0 0.0 - 0.2 %  Differential  Result  Value Ref Range   Neutrophils Relative % 58 %   Neutro Abs 3.8 1.7 - 7.7 K/uL   Lymphocytes Relative 28 %   Lymphs Abs 1.8 0.7 - 4.0 K/uL   Monocytes Relative 10 %   Monocytes Absolute 0.6 0.1 - 1.0 K/uL   Eosinophils Relative 3 %   Eosinophils Absolute 0.2 0.0 - 0.5 K/uL   Basophils Relative 1 %   Basophils Absolute 0.1 0.0 - 0.1 K/uL   Immature Granulocytes 0 %   Abs Immature Granulocytes 0.01 0.00 - 0.07 K/uL  Comprehensive metabolic panel  Result Value Ref Range   Sodium 140 135 - 145 mmol/L   Potassium 3.7 3.5 - 5.1 mmol/L   Chloride 108 98 - 111 mmol/L   CO2 22 22 - 32 mmol/L   Glucose, Bld 155 (H) 70 - 99 mg/dL   BUN 28 (H) 8 - 23 mg/dL   Creatinine, Ser 2.04 (H) 0.61 - 1.24 mg/dL   Calcium 8.9 8.9 - 10.3 mg/dL   Total Protein 7.1 6.5 - 8.1 g/dL   Albumin 4.2 3.5 - 5.0 g/dL   AST 42 (H) 15 - 41 U/L   ALT 56 (H) 0 - 44 U/L   Alkaline Phosphatase 103 38 - 126 U/L   Total Bilirubin 1.4 (H) 0.3 - 1.2 mg/dL   GFR calc non Af Amer 33 (L) >60 mL/min   GFR calc Af Amer 38 (L) >60 mL/min   Anion gap 10 5 - 15  Urine rapid drug screen (hosp performed)not at [facility]  Result Value Ref Range   Opiates NONE DETECTED NONE DETECTED   Cocaine NONE DETECTED NONE DETECTED   Benzodiazepines NONE DETECTED NONE DETECTED   Amphetamines NONE DETECTED NONE DETECTED   Tetrahydrocannabinol NONE DETECTED NONE DETECTED   Barbiturates NONE DETECTED NONE DETECTED  Urinalysis, Routine w reflex microscopic  Result Value Ref Range   Color, Urine YELLOW YELLOW   APPearance HAZY (A) CLEAR   Specific Gravity, Urine 1.020 1.005 - 1.030   pH 5.0 5.0 - 8.0   Glucose, UA NEGATIVE NEGATIVE mg/dL   Hgb urine dipstick SMALL (A) NEGATIVE   Bilirubin Urine NEGATIVE NEGATIVE   Ketones, ur NEGATIVE NEGATIVE mg/dL   Protein, ur 100 (A) NEGATIVE mg/dL    Nitrite NEGATIVE NEGATIVE   Leukocytes,Ua NEGATIVE NEGATIVE   RBC / HPF 0-5 0 - 5 RBC/hpf   WBC, UA 11-20 0 - 5 WBC/hpf   Bacteria, UA RARE (A) NONE SEEN   Squamous Epithelial / LPF 0-5 0 - 5   Mucus PRESENT    Hyaline Casts, UA PRESENT   Troponin I - ONCE - STAT  Result Value Ref Range   Troponin I 0.08 (HH) <0.03 ng/mL  Brain natriuretic peptide  Result Value Ref Range   B Natriuretic Peptide 978.0 (H) 0.0 - 100.0 pg/mL    Dg Chest Portable 1 View Result Date: 12/21/2018 CLINICAL DATA:  dizziness and sob since yesterday. Denies cough, fever, or any pain. Reports dizziness comes and goes. Pt describes dizziness as room spinning EXAM: PORTABLE CHEST - 1 VIEW COMPARISON:  none FINDINGS: Relatively low lung volumes. Can not exclude mild central pulmonary vascular congestion. No confluent airspace infiltrate or overt edema. Cardiomegaly. No effusion. No pneumothorax. Visualized bones unremarkable. IMPRESSION: Cardiomegaly.  No focal infiltrate. Electronically Signed   By: Lucrezia Europe M.D.   On: 12/21/2018 10:32    Ct Head Wo Contrast Result Date: 12/21/2018 CLINICAL DATA:  Dizziness and shortness of breath since yesterday.  EXAM: CT HEAD WITHOUT CONTRAST TECHNIQUE: Contiguous axial images were obtained from the base of the skull through the vertex without intravenous contrast. COMPARISON:  March 20, 2013 FINDINGS: Brain: No evidence of acute infarction, hemorrhage, hydrocephalus, extra-axial collection or mass lesion/mass effect. There is mild chronic diffuse atrophy. Bilateral periventricular white matter small vessel ischemic changes are identified. Vascular: No hyperdense vessel is noted. Skull: Normal. Negative for fracture or focal lesion. Sinuses/Orbits: Minimal mucoperiosteal thickening of bilateral ethmoid sinuses are noted. The orbits are normal. Other: None. IMPRESSION: 1. No focal acute intracranial abnormality identified. 2. Chronic diffuse atrophy. Chronic bilateral periventricular  white matter small vessel ischemic change. Electronically Signed   By: Abelardo Diesel M.D.   On: 12/21/2018 11:23      Results for MARCIAL, PLESS (MRN [id number]) as of 12/21/2018 10:38  Ref. Range 08/07/2016 08:02 10/22/2016 08:51 02/16/2017 09:16 12/21/2018 08:54  BUN Latest Ref Range: 8 - 23 mg/dL 28 (H) 19 11 28  (H)  Creatinine Latest Ref Range: 0.61 - 1.24 mg/dL 1.90 (H) 1.65 (H) 1.28 (H) 2.04 (H)       Timothy [name] was evaluated in Emergency Department on 12/21/2018 for the symptoms described in the history of present illness. He was evaluated in the context of the global COVID-19 pandemic, which necessitated consideration that the patient might be at risk for infection with the SARS-CoV-2 virus that causes COVID-19. Institutional protocols and algorithms that pertain to the evaluation of patients at risk for COVID-19 are in a state of rapid change based on information released by regulatory bodies including the CDC and federal and state organizations. These policies and algorithms were followed during the patient's care in the ED.    1145:  HR 130-140's, new aflutter on EKG. IV cardizem bolus and gtt started with HR decreasing to 70-80's. BP MAP also decreased by 20% after cardizem. CT-H reassuring. Worsening BUN/Cr on labs. Troponin mildly elevated; no old to compare. Dx and testing d/w pt.  Questions answered.  Verb understanding, agreeable to admit.  T/C returned from [facility] Dr. Domenic Polite, case discussed, including:  HPI, pertinent PM/SHx, VS/PE, dx testing, ED course and treatment:  Agrees with IV cardizem as pt has good rate control while on it, can dose PO/IV meds (ie: hydralazine) for further BP control (pt was on 4 BP meds previous per chart review), can give IV heparin prn, cycle troponin, pt will also need echocardiogram.    1155:  T/C returned from [facility] Dr. Cathlean Sauer, case discussed, including:  HPI, pertinent PM/SHx, VS/PE, dx testing, ED course and treatment, as well as d/w Cards  MD:  Agreeable to admit.          Final Clinical Impressions(s) / ED Diagnoses   Final diagnoses:  None    ED Discharge Orders    None       Francine Graven, DO 12/24/18 1636

## 2018-12-21 NOTE — ED Notes (Signed)
Patient currently in CT °

## 2018-12-21 NOTE — ED Triage Notes (Signed)
Pt reports dizziness and sob since yesterday.  Denies cough, fever, or any pain. Reports dizziness comes and goes.  Pt describes dizziness as room spinning.

## 2018-12-21 NOTE — Consult Note (Signed)
Cardiology Consultation:   Patient ID: [name]; [id number]; 04-29-1956   Admit date: 12/21/2018 Date of Consult: 12/21/2018  Primary Care Provider: Soyla Dryer, PA-C Primary Cardiologist: New   Patient Profile:   Timothy [name] is a 63 y.o. male with a history of hypertension, hyperlipidemia, CKD stage III, alcohol abuse, and noncompliance who is being seen today for the evaluation of newly documented atrial flutter at the request of Dr Cathlean Sauer.  History of Present Illness:   Timothy [name] presents to the hospital reporting approximately 2-day history of lightheadedness as well as mild shortness of breath.  He does not report any specific sense of palpitations or chest pain.  He has had no syncope.  His history includes hypertension as outlined below, not compliant with medical therapy or regular follow-up.  He was last seen by PCP in 2018.  He has an ongoing history of alcohol abuse.  Previous outpatient medical regimen included Lopressor, Zestoretic, Norvasc, and clonidine.  His presenting chest x-ray shows cardiomegaly but no obvious infiltrates or pulmonary edema.  At 978.  Initial troponin I is 0.08.  He also has evidence of progressive renal insufficiency with creatinine up to 2.0 on baseline CKD stage III most likely.  UDS is negative and SARS coronavirus 2 is negative.  Past Medical History:  Diagnosis Date  . Alcohol abuse   . CKD (chronic kidney disease) stage 3, GFR 30-59 ml/min (HCC)   . Hyperlipidemia   . Hypertension   . Noncompliance     History reviewed. No pertinent surgical history.   Inpatient Medications: Scheduled Meds: . aspirin EC  81 mg Oral Daily  . folic acid  1 mg Oral Daily  . heparin  4,000 Units Intravenous Once  . hydrALAZINE  50 mg Oral Q8H  . [START ON 12/22/2018] hydrochlorothiazide  25 mg Oral Daily  . metoprolol tartrate  100 mg Oral BID  . multivitamin with minerals  1 tablet Oral Daily  . [START ON 12/22/2018]  pneumococcal 23 valent vaccine  0.5 mL Intramuscular Tomorrow-1000  . pravastatin  10 mg Oral q1800  . sodium chloride flush  3 mL Intravenous Q12H  . thiamine  100 mg Oral Daily   Or  . thiamine  100 mg Intravenous Daily   Continuous Infusions: . sodium chloride    . diltiazem (CARDIZEM) infusion 5 mg/hr (12/21/18 0916)  . heparin 1,200 Units/hr (12/21/18 1335)   PRN Meds: sodium chloride, acetaminophen **OR** acetaminophen, LORazepam **OR** LORazepam, ondansetron **OR** ondansetron (ZOFRAN) IV, sodium chloride flush  Allergies:   No Known Allergies  Social History:   Social History   Socioeconomic History  . Marital status: Married    Spouse name: Not on file  . Number of children: Not on file  . Years of education: Not on file  . Highest education level: Not on file  Occupational History  . Not on file  Social Needs  . Financial resource strain: Not on file  . Food insecurity:    Worry: Not on file    Inability: Not on file  . Transportation needs:    Medical: Not on file    Non-medical: Not on file  Tobacco Use  . Smoking status: Former Smoker    Types: Cigars  . Smokeless tobacco: Never Used  Substance and Sexual Activity  . Alcohol use: Yes    Alcohol/week: 7.0 standard drinks    Types: 7 Cans of beer per week    Comment: daily- says quit last week  .  Drug use: No  . Sexual activity: Not on file  Lifestyle  . Physical activity:    Days per week: Not on file    Minutes per session: Not on file  . Stress: Not on file  Relationships  . Social connections:    Talks on phone: Not on file    Gets together: Not on file    Attends religious service: Not on file    Active member of club or organization: Not on file    Attends meetings of clubs or organizations: Not on file    Relationship status: Not on file  . Intimate partner violence:    Fear of current or ex partner: Not on file    Emotionally abused: Not on file    Physically abused: Not on file     Forced sexual activity: Not on file  Other Topics Concern  . Not on file  Social History Narrative  . Not on file    Family History:   The patient's family history includes Cancer in his father; Diabetes in his brother, mother, and sister; Hypertension in his brother, brother, father, and mother.  ROS:  Please see the history of present illness.  Mild ankle edema recently, no orthopnea or PND.  All other ROS reviewed and negative.     Physical Exam/Data:   Vitals:   12/21/18 1200 12/21/18 1239 12/21/18 1243 12/21/18 1321  BP: (!) 203/119  (!) 187/130 (!) 204/119  Pulse: 71  (!) 43   Resp: 12  16   Temp:  97.8 F (36.6 C)    TempSrc:  Oral    SpO2: 99%  96%   Weight:  92.9 kg    Height:  5\' 6"  (1.676 m)      Intake/Output Summary (Last 24 hours) at 12/21/2018 1336 Last data filed at 12/21/2018 1328 Gross per 24 hour  Intake 3 ml  Output -  Net 3 ml   Filed Weights   12/21/18 0825 12/21/18 1239  Weight: 90.7 kg 92.9 kg   Body mass index is 33.06 kg/m.   Gen: Obese male, no distress. HEENT: Conjunctiva and lids normal, oropharynx clear with poor dentition. Neck: Supple, elevated JVP, no carotid bruits, no thyromegaly. Lungs: Decreased breath sounds without rales, nonlabored breathing at rest. Cardiac: Irregularly irregular, no S3 or significant systolic murmur, no pericardial rub. Abdomen: Soft, nontender, bowel sounds present, no guarding or rebound. Extremities: Mild ankle and lower leg edema, distal pulses 2+. Skin: Warm and dry. Musculoskeletal: No kyphosis. Neuropsychiatric: Alert and oriented x3, affect grossly appropriate.  EKG:  I personally reviewed the tracing from 12/21/2018 which shows typical atrial flutter with variable conduction, LVH and repolarization abnormalities.  Telemetry:  I personally reviewed telemetry which shows rate controlled atrial fibrillation/flutter.  Relevant CV Studies:  None prior.  Laboratory Data:  Chemistry Recent Labs   Lab 12/21/18 0854  NA 140  K 3.7  CL 108  CO2 22  GLUCOSE 155*  BUN 28*  CREATININE 2.04*  CALCIUM 8.9  GFRNONAA 33*  GFRAA 38*  ANIONGAP 10    Recent Labs  Lab 12/21/18 0854  PROT 7.1  ALBUMIN 4.2  AST 42*  ALT 56*  ALKPHOS 103  BILITOT 1.4*   Hematology Recent Labs  Lab 12/21/18 0854  WBC 6.5  RBC 6.28*  HGB 16.5  HCT 51.6  MCV 82.2  MCH 26.3  MCHC 32.0  RDW 16.2*  PLT 222   Cardiac Enzymes Recent Labs  Lab 12/21/18 0854  TROPONINI 0.08*   No results for input(s): TROPIPOC in the last 168 hours.  BNP Recent Labs  Lab 12/21/18 0856  BNP 978.0*    DDimer No results for input(s): DDIMER in the last 168 hours.  Radiology/Studies:  Ct Head Wo Contrast  Result Date: 12/21/2018 CLINICAL DATA:  Dizziness and shortness of breath since yesterday. EXAM: CT HEAD WITHOUT CONTRAST TECHNIQUE: Contiguous axial images were obtained from the base of the skull through the vertex without intravenous contrast. COMPARISON:  March 20, 2013 FINDINGS: Brain: No evidence of acute infarction, hemorrhage, hydrocephalus, extra-axial collection or mass lesion/mass effect. There is mild chronic diffuse atrophy. Bilateral periventricular white matter small vessel ischemic changes are identified. Vascular: No hyperdense vessel is noted. Skull: Normal. Negative for fracture or focal lesion. Sinuses/Orbits: Minimal mucoperiosteal thickening of bilateral ethmoid sinuses are noted. The orbits are normal. Other: None. IMPRESSION: 1. No focal acute intracranial abnormality identified. 2. Chronic diffuse atrophy. Chronic bilateral periventricular white matter small vessel ischemic change. Electronically Signed   By: Abelardo Diesel M.D.   On: 12/21/2018 11:23   Dg Chest Portable 1 View  Result Date: 12/21/2018 CLINICAL DATA:  dizziness and sob since yesterday. Denies cough, fever, or any pain. Reports dizziness comes and goes. Pt describes dizziness as room spinning EXAM: PORTABLE CHEST - 1 VIEW  COMPARISON:  none FINDINGS: Relatively low lung volumes. Can not exclude mild central pulmonary vascular congestion. No confluent airspace infiltrate or overt edema. Cardiomegaly. No effusion. No pneumothorax. Visualized bones unremarkable. IMPRESSION: Cardiomegaly.  No focal infiltrate. Electronically Signed   By: Lucrezia Europe M.D.   On: 12/21/2018 10:32    Assessment and Plan:   1.  Newly documented typical atrial flutter presenting with RVR, duration uncertain.  He reports lightheadedness and mild shortness of breath over the last few days, so potentially recent.  He has a history of alcohol abuse which may be contributing, and may also have an associated cardiomyopathy at least based on cardiomegaly by chest x-ray and elevated BNP.  CHADSVASC score is likely 3.  2.  Uncontrolled hypertension, presenting with hypertensive emergency in the setting of noncompliance with medications.  3.  Alcohol abuse.  At risk for withdrawal.  4.  Acute on chronic renal failure with CKD stage III at baseline.  5.  History of hyperlipidemia, previously on Pravachol.  Patient has been admitted to the hospitalist service for further evaluation and management.  He is currently on intravenous diltiazem which is providing good heart rate control of atrial flutter, however his blood pressure remains significantly elevated.  He has been started on hydralazine and Lopressor with plan to titrate oral regimen and wean intravenous diltiazem as able.  Agree with dose of IV Lasix as well, follow-up echocardiogram to assess cardiac structure and function.  Depending on LVEF we can further modify treatment for hypertension, would agree that clonidine is not a great choice for him in the setting of noncompliance.  Norvasc may ultimately be reasonable if we get him off diltiazem and are able to manage heart rate with beta-blocker.  He is a potential candidate for DOAC based on thromboembolic risk, however would suggest getting his  blood pressure better controlled first and have further discussion particularly in light of his alcohol abuse.  Would not pursue an attempt at cardioversion at this time.  Also, will add TSH and for now hold off on HCTZ until course of his renal dysfunction is better understood.   Signed, Rozann Lesches, MD  12/21/2018 1:36 PM

## 2018-12-21 NOTE — Progress Notes (Signed)
*  PRELIMINARY RESULTS* Echocardiogram 2D Echocardiogram has been performed.  Leavy Cella 12/21/2018, 3:49 PM

## 2018-12-21 NOTE — ED Notes (Signed)
Bethena Roys spouse-[phone]

## 2018-12-21 NOTE — Progress Notes (Signed)
ANTICOAGULATION CONSULT NOTE - Initial Consult  Pharmacy Consult for heparin Indication: atrial fibrillation  No Known Allergies  Patient Measurements: Height: 5\' 6"  (167.6 cm) Weight: 204 lb 12.9 oz (92.9 kg) IBW/kg (Calculated) : 63.8 Heparin Dosing Weight: 83 kg  Vital Signs: Temp: 98.3 F (36.8 C) (05/20 1617) Temp Source: Oral (05/20 1239) BP: 152/99 (05/20 2127) Pulse Rate: 68 (05/20 2127)  Labs: Recent Labs    12/21/18 0854 12/21/18 1336 12/21/18 1936  HGB 16.5  --   --   HCT 51.6  --   --   PLT 222  --   --   APTT 37*  --   --   LABPROT 15.1  --   --   INR 1.2  --   --   HEPARINUNFRC  --   --  0.42  CREATININE 2.04*  --   --   TROPONINI 0.08* 0.07* 0.07*    Estimated Creatinine Clearance: 38.5 mL/min (A) (by C-G formula based on SCr of 2.04 mg/dL (H)).   Medical History: Past Medical History:  Diagnosis Date  . Alcohol abuse   . CKD (chronic kidney disease) stage 3, GFR 30-59 ml/min (HCC)   . Hyperlipidemia   . Hypertension   . Noncompliance     Medications:  Medications Prior to Admission  Medication Sig Dispense Refill Last Dose  . aspirin EC 81 MG tablet Take 81 mg by mouth daily. Reported on 10/29/2015   12/21/2018 at Unknown time    Assessment: Pharmacy consulted to dose heparin in patient with atrial fibrillation.  Patient not on anticoagulation prior to admission.  Goal of Therapy:  Heparin level 0.3-0.7 units/ml Monitor platelets by anticoagulation protocol: Yes   HL 0.47   Plan:  Continue heparin infusion at 1200 units/hr Check anti-Xa level daily while on heparin. Continue to monitor H&H and platelets.   Revonda Standard Breona Cherubin 12/21/2018,10:10 PM

## 2018-12-21 NOTE — ED Notes (Signed)
Dr. Thurnell Garbe made aware of HTN

## 2018-12-21 NOTE — ED Notes (Signed)
Patient denies any dizziness upon sitting and standing.  

## 2018-12-21 NOTE — ED Notes (Signed)
Date and time results received: 12/21/18 0922 (use smartphrase ".now" to insert current time)  Test: troponin Critical Value: 0.08  Name of Provider Notified: Thurnell Garbe   Orders Received? Or Actions Taken?: n/a

## 2018-12-21 NOTE — H&P (Signed)
History and Physical    [name] BSJ:[id number] DOB: [birth year] DOA: 12/21/2018  PCP: Patient, No Pcp Per   Patient coming from:  Home   Chief Complaint: dizziness   HPI: Timothy [name] is a 63 y.o. male with medical history significant of hypertension, dyslipidemia, chronic kidney disease and alcohol abuse.  Patient presents with dizziness for the last 48 hours.  His dizziness has been persistent, moderate in intensity, no improving or worsening factors, no associated nausea, vomiting, disbalance or vertigo.  He usually drinks a sixpack of beer daily, he stopped drinking 5 days ago, but he had a small amount of liquor last night.  Denies tobacco abuse or illicit drug abuse.  Denies any dyspnea, palpitations, PND, orthopnea or lower extremity edema.  He came to the hospital due to persistent symptoms.  He has been of medications for the last 2 years.  ED Course: Patient was found severely hypertensive and tachycardic in atrial flutter rhythm, he has been placed on a continuous infusion of diltiazem for rate control.  Review of Systems:  1. General: No fevers, no chills, no weight gain or weight loss 2. ENT: No runny nose or sore throat, no hearing disturbances 3. Pulmonary: No dyspnea, cough, wheezing, or hemoptysis 4. Cardiovascular: No angina, claudication, lower extremity edema, pnd or orthopnea 5. Gastrointestinal: No nausea or vomiting, no diarrhea or constipation 6. Hematology: No easy bruisability or frequent infections 7. Urology: No dysuria, hematuria or increased urinary frequency 8. Dermatology: No rashes. 9. Neurology: No seizures or paresthesias/ positive dizziness as mentioned in HPI.  10. Musculoskeletal: No joint pain or deformities  Past Medical History:  Diagnosis Date  . CKD (chronic kidney disease)   . Hyperlipidemia   . Hypertension 2015  . Noncompliance     History reviewed. No pertinent surgical history.   reports that he has quit smoking. His  smoking use included cigars. He has never used smokeless tobacco. He reports current alcohol use of about 7.0 standard drinks of alcohol per week. He reports that he does not use drugs.  No Known Allergies  Family History  Problem Relation Age of Onset  . Diabetes Mother   . Hypertension Mother   . Cancer Father        Prostate Cancer  . Hypertension Father   . Diabetes Sister   . Diabetes Brother   . Hypertension Brother   . Hypertension Brother      Prior to Admission medications   Medication Sig Start Date End Date Taking? Authorizing Provider  amLODipine (NORVASC) 10 MG tablet Take 1 tablet (10 mg total) by mouth daily. 02/10/17   Soyla Dryer, PA-C  aspirin EC 81 MG tablet Take 81 mg by mouth daily. Reported on 10/29/2015    [provider]  cloNIDine (CATAPRES) 0.2 MG tablet Take 1 tablet (0.2 mg total) by mouth 2 (two) times daily. 02/10/17   Soyla Dryer, PA-C  lisinopril-hydrochlorothiazide (ZESTORETIC) 20-12.5 MG tablet Take 2 tablets by mouth daily. 02/10/17   Soyla Dryer, PA-C  lovastatin (MEVACOR) 20 MG tablet Take 1 tablet (20 mg total) by mouth at bedtime. 02/10/17   Soyla Dryer, PA-C  metoprolol tartrate (LOPRESSOR) 100 MG tablet Take 1 tablet (100 mg total) by mouth 2 (two) times daily. 02/10/17   Soyla Dryer, PA-C    Physical Exam: Vitals:   12/21/18 0945 12/21/18 1000 12/21/18 1100 12/21/18 1130  BP:  (!) 193/118 (!) 197/108 (!) 187/101  Pulse: (!) 59 62 (!) 42 (!) 43  Resp: (!) 31  (!) 28 (!) 28  Temp:      TempSrc:      SpO2: 98% 99% 99% 99%  Weight:      Height:        Vitals:   12/21/18 0945 12/21/18 1000 12/21/18 1100 12/21/18 1130  BP:  (!) 193/118 (!) 197/108 (!) 187/101  Pulse: (!) 59 62 (!) 42 (!) 43  Resp: (!) 31  (!) 28 (!) 28  Temp:      TempSrc:      SpO2: 98% 99% 99% 99%  Weight:      Height:       General: deconditioned and ill looking appearing  Neurology: Awake and alert, non focal. Positive anxiety,  but no tremors.  Head and Neck. Head normocephalic. Neck supple with no adenopathy or thyromegaly.   E ENT: mild pallor, no icterus, oral mucosa  Dry.  Cardiovascular: No JVD. S1-S2 present, tachycardic, irregular, no gallops, rubs, or murmurs. No lower extremity edema. Pulmonary: vesicular breath sounds bilaterally, adequate air movement, no wheezing, rhonchi or rales. Gastrointestinal. Abdomen with no organomegaly, non tender, no rebound or guarding Skin. No rashes Musculoskeletal: no joint deformities    Labs on Admission: I have personally reviewed following labs and imaging studies  CBC: Recent Labs  Lab 12/21/18 0854  WBC 6.5  NEUTROABS 3.8  HGB 16.5  HCT 51.6  MCV 82.2  PLT 097   Basic Metabolic Panel: Recent Labs  Lab 12/21/18 0854  NA 140  K 3.7  CL 108  CO2 22  GLUCOSE 155*  BUN 28*  CREATININE 2.04*  CALCIUM 8.9   GFR: Estimated Creatinine Clearance: 38.1 mL/min (A) (by C-G formula based on SCr of 2.04 mg/dL (H)). Liver Function Tests: Recent Labs  Lab 12/21/18 0854  AST 42*  ALT 56*  ALKPHOS 103  BILITOT 1.4*  PROT 7.1  ALBUMIN 4.2   No results for input(s): LIPASE, AMYLASE in the last 168 hours. No results for input(s): AMMONIA in the last 168 hours. Coagulation Profile: Recent Labs  Lab 12/21/18 0854  INR 1.2   Cardiac Enzymes: Recent Labs  Lab 12/21/18 0854  TROPONINI 0.08*   BNP (last 3 results) No results for input(s): PROBNP in the last 8760 hours. HbA1C: No results for input(s): HGBA1C in the last 72 hours. CBG: No results for input(s): GLUCAP in the last 168 hours. Lipid Profile: No results for input(s): CHOL, HDL, LDLCALC, TRIG, CHOLHDL, LDLDIRECT in the last 72 hours. Thyroid Function Tests: No results for input(s): TSH, T4TOTAL, FREET4, T3FREE, THYROIDAB in the last 72 hours. Anemia Panel: No results for input(s): VITAMINB12, FOLATE, FERRITIN, TIBC, IRON, RETICCTPCT in the last 72 hours. Urine analysis:    Component  Value Date/Time   COLORURINE YELLOW 12/21/2018 0930   APPEARANCEUR HAZY (A) 12/21/2018 0930   LABSPEC 1.020 12/21/2018 0930   PHURINE 5.0 12/21/2018 0930   GLUCOSEU NEGATIVE 12/21/2018 0930   HGBUR SMALL (A) 12/21/2018 0930   BILIRUBINUR NEGATIVE 12/21/2018 0930   KETONESUR NEGATIVE 12/21/2018 0930   PROTEINUR 100 (A) 12/21/2018 0930   UROBILINOGEN 0.2 02/03/2008 1342   NITRITE NEGATIVE 12/21/2018 0930   LEUKOCYTESUR NEGATIVE 12/21/2018 0930    Radiological Exams on Admission: Ct Head Wo Contrast  Result Date: 12/21/2018 CLINICAL DATA:  Dizziness and shortness of breath since yesterday. EXAM: CT HEAD WITHOUT CONTRAST TECHNIQUE: Contiguous axial images were obtained from the base of the skull through the vertex without intravenous contrast. COMPARISON:  March 20, 2013 FINDINGS: Brain:  No evidence of acute infarction, hemorrhage, hydrocephalus, extra-axial collection or mass lesion/mass effect. There is mild chronic diffuse atrophy. Bilateral periventricular white matter small vessel ischemic changes are identified. Vascular: No hyperdense vessel is noted. Skull: Normal. Negative for fracture or focal lesion. Sinuses/Orbits: Minimal mucoperiosteal thickening of bilateral ethmoid sinuses are noted. The orbits are normal. Other: None. IMPRESSION: 1. No focal acute intracranial abnormality identified. 2. Chronic diffuse atrophy. Chronic bilateral periventricular white matter small vessel ischemic change. Electronically Signed   By: Abelardo Diesel M.D.   On: 12/21/2018 11:23   Dg Chest Portable 1 View  Result Date: 12/21/2018 CLINICAL DATA:  dizziness and sob since yesterday. Denies cough, fever, or any pain. Reports dizziness comes and goes. Pt describes dizziness as room spinning EXAM: PORTABLE CHEST - 1 VIEW COMPARISON:  none FINDINGS: Relatively low lung volumes. Can not exclude mild central pulmonary vascular congestion. No confluent airspace infiltrate or overt edema. Cardiomegaly. No effusion.  No pneumothorax. Visualized bones unremarkable. IMPRESSION: Cardiomegaly.  No focal infiltrate. Electronically Signed   By: Lucrezia Europe M.D.   On: 12/21/2018 10:32    EKG: Independently reviewed.  127 bpm, normal axis, atrial flutter, variable block, 1:1, 2:1, 3:1, positive LVH, precordial J-point elevation with poor R wave progression.  Assessment/Plan Active Problems:   Hx of atrial flutter  63 year old male with significant past medical history for hypertension, dyslipidemia, chronic kidney disease and alcohol abuse who presents with dizziness for the last 48 hours.  He decrease his alcohol consumption about 5 days ago.  On his initial physical examination his temperature was 98, blood pressure 187/143, heart rate 127, respiratory rate 26, oxygen saturation 97% on room air.  Dry mucous membranes, lungs clear to auscultation, heart S1-S2 present, irregular, abdomen soft nontender, no lower extremity edema.  Sodium 140, potassium 3.7, chloride 108, bicarb 22, glucose 155, BUN 28, creatinine 2.0, BNP 978, troponins 0.08, white count 6.5, hemoglobin 16.5, hematocrit 51.6, platelets 222.  SARS COVID-19 negative.  Urinalysis negative for infection.  Alcohol level less than 10, toxicology screen negative.  Head CT negative for acute changes.  Chest radiograph with cardiomegaly and vascular congestion.  Patient will be admitted to the stepdown unit with a working diagnosis of hypertensive emergency complicated by atrial flutter with rapid ventricular response and cardiogenic pulmonary edema.  1.  Atrial flutter with rapid ventricular response complicated by cardiogenic pulmonary edema.  Patient will be admitted to the stepdown unit, continue IV diltiazem for rate control, will give 40 mg of IV furosemide x1 for volume overload.  Continue oximetry monitoring and supplemental oxygen per nasal cannula.  Once heart rate is better controlled will perform echocardiography.  ChadsVasc score is 2, anticoagulation  with IV heparin.  2.  Hypertensive emergency.  Will continue blood pressure control with metoprolol 100 mg twice daily, hydrochlorothiazide 25 mg daily, will hold on clonidine and lisinopril for now.  Add hydralazine 50 mg 3 times daily.  Diuresis with furosemide.  Once renal function more stable will resume ACE inhibitor.  Due to patient's poor compliance will avoid clonidine due to rebound hypertension.  Troponin elevation likely related to uncontrolled hypertension and atrial flutter, no chest pain.  Continue to follow troponins.  3.  Alcohol abuse with positive withdrawal symptoms.  Patient will be placed on alcohol withdrawal protocol with lorazepam - CIWA.  Add thiamine and multivitamins.  4.  Acute kidney injury on chronic kidney disease stage 2.  Worsening kidney function, serum creatinine up to 2.0 from 1.2, will continue  diuresis with IV furosemide, blood pressure control with hydrochlorothiazide, metoprolol and hydralazine.  Hold on ACE inhibitor's for now.  5.  Dyslipidemia.  Resume pravastatin.    DVT prophylaxis:  IV heparin  Code Status: full  Family Communication: no family at the bedside   Disposition Plan: step down unit   Consults called:  None   Admission status: Inpatient.     Deivi Huckins Gerome Apley MD [facility]   12/21/2018, 12:00 PM

## 2018-12-22 DIAGNOSIS — F10239 Alcohol dependence with withdrawal, unspecified: Secondary | ICD-10-CM

## 2018-12-22 DIAGNOSIS — F10939 Alcohol use, unspecified with withdrawal, unspecified: Secondary | ICD-10-CM

## 2018-12-22 DIAGNOSIS — I161 Hypertensive emergency: Secondary | ICD-10-CM

## 2018-12-22 DIAGNOSIS — I429 Cardiomyopathy, unspecified: Secondary | ICD-10-CM

## 2018-12-22 DIAGNOSIS — N189 Chronic kidney disease, unspecified: Secondary | ICD-10-CM

## 2018-12-22 DIAGNOSIS — Z91199 Patient's noncompliance with other medical treatment and regimen due to unspecified reason: Secondary | ICD-10-CM

## 2018-12-22 DIAGNOSIS — I483 Typical atrial flutter: Secondary | ICD-10-CM

## 2018-12-22 DIAGNOSIS — N179 Acute kidney failure, unspecified: Secondary | ICD-10-CM

## 2018-12-22 DIAGNOSIS — F1010 Alcohol abuse, uncomplicated: Secondary | ICD-10-CM

## 2018-12-22 DIAGNOSIS — I10 Essential (primary) hypertension: Secondary | ICD-10-CM

## 2018-12-22 DIAGNOSIS — Z9119 Patient's noncompliance with other medical treatment and regimen: Secondary | ICD-10-CM

## 2018-12-22 LAB — BASIC METABOLIC PANEL
Anion gap: 11 (ref 5–15)
BUN: 26 mg/dL — ABNORMAL HIGH (ref 8–23)
CO2: 22 mmol/L (ref 22–32)
Calcium: 8.9 mg/dL (ref 8.9–10.3)
Chloride: 107 mmol/L (ref 98–111)
Creatinine, Ser: 2.03 mg/dL — ABNORMAL HIGH (ref 0.61–1.24)
GFR calc Af Amer: 39 mL/min — ABNORMAL LOW (ref >60)
GFR calc non Af Amer: 33 mL/min — ABNORMAL LOW (ref >60)
Glucose, Bld: 128 mg/dL — ABNORMAL HIGH (ref 70–99)
Potassium: 4 mmol/L (ref 3.5–5.1)
Sodium: 140 mmol/L (ref 135–145)

## 2018-12-22 LAB — CBC
HCT: 46.2 % (ref 39.0–52.0)
Hemoglobin: 14.6 g/dL (ref 13.0–17.0)
MCH: 26 pg (ref 26.0–34.0)
MCHC: 31.6 g/dL (ref 30.0–36.0)
MCV: 82.4 fL (ref 80.0–100.0)
Platelets: 195 10*3/uL (ref 150–400)
RBC: 5.61 MIL/uL (ref 4.22–5.81)
RDW: 14.6 % (ref 11.5–15.5)
WBC: 6.4 10*3/uL (ref 4.0–10.5)
nRBC: 0 % (ref 0.0–0.2)

## 2018-12-22 LAB — HIV ANTIBODY (ROUTINE TESTING W REFLEX): HIV Screen 4th Generation wRfx: NONREACTIVE

## 2018-12-22 LAB — TROPONIN I: Troponin I: 0.07 ng/mL (ref <0.03)

## 2018-12-22 LAB — HEPARIN LEVEL (UNFRACTIONATED): Heparin Unfractionated: 0.22 IU/mL — ABNORMAL LOW (ref 0.30–0.70)

## 2018-12-22 MED ORDER — CARVEDILOL 12.5 MG PO TABS
25.0000 mg | ORAL_TABLET | Freq: Two times a day (BID) | ORAL | Status: DC
  Administered 2018-12-22 – 2018-12-23 (×4): 25 mg via ORAL
  Filled 2018-12-22 (×4): qty 2

## 2018-12-22 MED ORDER — ISOSORBIDE DINITRATE 20 MG PO TABS: 20.0000 mg | ORAL_TABLET | Freq: Three times a day (TID) | ORAL | Status: DC

## 2018-12-22 MED ORDER — APIXABAN 5 MG PO TABS
5.0000 mg | ORAL_TABLET | Freq: Two times a day (BID) | ORAL | Status: DC
  Administered 2018-12-22 – 2018-12-23 (×3): 5 mg via ORAL
  Filled 2018-12-22 (×3): qty 1

## 2018-12-22 MED ORDER — HEPARIN BOLUS VIA INFUSION
1200.0000 [IU] | Freq: Once | INTRAVENOUS | Status: AC
  Administered 2018-12-22: 1200 [IU] via INTRAVENOUS
  Filled 2018-12-22: qty 1200

## 2018-12-22 MED ORDER — AMLODIPINE BESYLATE 5 MG PO TABS
10.0000 mg | ORAL_TABLET | Freq: Every day | ORAL | Status: DC
  Administered 2018-12-22 – 2018-12-23 (×2): 10 mg via ORAL
  Filled 2018-12-22 (×2): qty 2

## 2018-12-22 MED ORDER — ISOSORB DINITRATE-HYDRALAZINE 20-37.5 MG PO TABS
1.0000 | ORAL_TABLET | Freq: Three times a day (TID) | ORAL | Status: DC
  Administered 2018-12-22 (×2): 1 via ORAL
  Filled 2018-12-22 (×13): qty 1

## 2018-12-22 MED ORDER — CHLORHEXIDINE GLUCONATE CLOTH 2 % EX PADS
6.0000 | MEDICATED_PAD | Freq: Every day | CUTANEOUS | Status: DC
  Administered 2018-12-22: 6 via TOPICAL

## 2018-12-22 NOTE — TOC Initial Note (Addendum)
Transition of Care [facility]) - Initial/Assessment Note    Patient Details  Name: Timothy [name] MRN: [id number] Date of Birth: August 09, 1955  Transition of Care [facility]) CM/SW Contact:    Madylyn Insco, Chauncey Reading, RN Phone Number: 12/22/2018, 2:57 PM  Clinical Narrative:   Atrial Flutter.  From home with wife. Independent. No current PCP. Will be started on Eliquis. Discussed with patient. Will do benefits check for cost of medication.  Call to several local PCP's to establish care. [facility] to return call to see if they can accept patient.               ADDENDUM 1533: Patient does not have prescription coverage.  Discussed with patient and daughter via phone.   30 day free card given for Eliquis. Discussed with patient that it will be imperative that he follow up post DC with either cardiology or PCP to fill out financial assistance paperwork for Eliquis, he voices understanding.   Still waiting on call from [facility].   Expected Discharge Plan: Home/Self Care Barriers to Discharge: No Barriers Identified   Patient Goals and CMS Choice Patient states their goals for this hospitalization and ongoing recovery are:: get home ASAP      Expected Discharge Plan and Services Expected Discharge Plan: Home/Self Care   Discharge Planning Services: CM Consult    Prior Living Arrangements/Services   Lives with:: Spouse Patient language and need for interpreter reviewed:: Yes Do you feel safe going back to the place where you live?: Yes      Need for Family Participation in Patient Care: Yes (Comment) Care giver support system in place?: Yes (comment)   Criminal Activity/Legal Involvement Pertinent to Current Situation/Hospitalization: No - Comment as needed  Activities of Daily Living Home Assistive Devices/Equipment: None ADL Screening (condition at time of admission) Patient's cognitive ability adequate to safely complete daily activities?: Yes Is the patient deaf or  have difficulty hearing?: No Does the patient have difficulty seeing, even when wearing glasses/contacts?: No Does the patient have difficulty concentrating, remembering, or making decisions?: No Patient able to express need for assistance with ADLs?: Yes Does the patient have difficulty dressing or bathing?: No Independently performs ADLs?: Yes (appropriate for developmental age) Does the patient have difficulty walking or climbing stairs?: No Weakness of Legs: None Weakness of Arms/Hands: None  Permission Sought/Granted   Permission granted to share information with : Yes, Verbal Permission Granted              Emotional Assessment Appearance:: Appears stated age Attitude/Demeanor/Rapport: Engaged Affect (typically observed): Accepting Orientation: : Oriented to Self, Oriented to Place, Oriented to  Time      Admission diagnosis:  Dizziness [R42] Elevated troponin [R79.89] Hypertensive urgency [I16.0] Noncompliance [Z91.19] New onset atrial flutter (HCC) [I48.92] Chronic kidney disease, unspecified CKD stage [N18.9] Patient Active Problem List   Diagnosis Date Noted  . AKI (acute kidney injury) ([place]) 12/22/2018  . Alcohol withdrawal syndrome with complication, with unspecified complication ([place]) 40/98/1191  . Secondary cardiomyopathy ([place])   . Typical atrial flutter ([place])   . Hypertensive emergency   . Noncompliance   . Acute renal failure superimposed on stage 3 chronic kidney disease ([place])   . Alcohol abuse   . Hx of atrial flutter 12/21/2018  . Essential hypertension, benign 10/29/2015  . Hyperlipidemia 10/29/2015  . Obesity 10/29/2015  . CKD (chronic kidney disease) 10/29/2015   PCP:  No primary care provider on file. Pharmacy:   [facility],  [place] - [id number] [place] [address] Phone: [phone] Fax: [phone]    Readmission Risk Interventions No flowsheet data found.

## 2018-12-22 NOTE — Progress Notes (Signed)
Progress Note  Patient Name: Timothy [name] Date of Encounter: 12/22/2018  Primary Cardiologist: Satira Sark, MD ([place])  Subjective   States that he feels better today, no dizziness or shortness of breath, no chest pain or palpitations.  Inpatient Medications    Scheduled Meds: . amLODipine  10 mg Oral Daily  . aspirin EC  81 mg Oral Daily  . carvedilol  25 mg Oral BID WC  . Chlorhexidine Gluconate Cloth  6 each Topical Q0600  . folic acid  1 mg Oral Daily  . isosorbide-hydrALAZINE  1 tablet Oral TID  . multivitamin with minerals  1 tablet Oral Daily  . pneumococcal 23 valent vaccine  0.5 mL Intramuscular Tomorrow-1000  . pravastatin  10 mg Oral q1800  . sodium chloride flush  3 mL Intravenous Q12H  . thiamine  100 mg Oral Daily   Or  . thiamine  100 mg Intravenous Daily   Continuous Infusions: . sodium chloride     PRN Meds: sodium chloride, acetaminophen **OR** acetaminophen, hydrALAZINE, LORazepam **OR** LORazepam, ondansetron **OR** ondansetron (ZOFRAN) IV, sodium chloride flush   Vital Signs    Vitals:   12/22/18 0600 12/22/18 0700 12/22/18 0800 12/22/18 0830  BP: (!) 164/121 (!) 160/105 (!) 171/118 (!) 158/110  Pulse: 95 94 92 92  Resp: (!) 33 (!) 28 19 (!) 24  Temp: 98.1 F (36.7 C)     TempSrc: Oral     SpO2: 100% 99% 100% 99%  Weight: 92.9 kg     Height:        Intake/Output Summary (Last 24 hours) at 12/22/2018 0856 Last data filed at 12/22/2018 0845 Gross per 24 hour  Intake 556.42 ml  Output 1300 ml  Net -743.58 ml   Filed Weights   12/21/18 0825 12/21/18 1239 12/22/18 0600  Weight: 90.7 kg 92.9 kg 92.9 kg    Telemetry    Heart rate 80s to 90s, sinus rhythm with prolonged PR interval versus atrial flutter with variable conduction.  Personally reviewed.  ECG    Tracing from 12/21/2018 showed typical atrial flutter with 4:1 block.  Personally reviewed.  Physical Exam   GEN: No acute distress.   Neck: No JVD. Cardiac:  Largely  regular, no gallop.  Respiratory: Nonlabored.  Decreased breath sounds at the very bases, no crackles. GI: Soft, nontender, bowel sounds present. MS: Trace ankle edema; No deformity. Neuro:  Nonfocal. Psych: Alert and oriented x 3. Normal affect.  Labs    Chemistry Recent Labs  Lab 12/21/18 0854 12/22/18 0427  NA 140 140  K 3.7 4.0  CL 108 107  CO2 22 22  GLUCOSE 155* 128*  BUN 28* 26*  CREATININE 2.04* 2.03*  CALCIUM 8.9 8.9  PROT 7.1  --   ALBUMIN 4.2  --   AST 42*  --   ALT 56*  --   ALKPHOS 103  --   BILITOT 1.4*  --   GFRNONAA 33* 33*  GFRAA 38* 39*  ANIONGAP 10 11     Hematology Recent Labs  Lab 12/21/18 0854 12/22/18 0427  WBC 6.5 6.4  RBC 6.28* 5.61  HGB 16.5 14.6  HCT 51.6 46.2  MCV 82.2 82.4  MCH 26.3 26.0  MCHC 32.0 31.6  RDW 16.2* 14.6  PLT 222 195    Cardiac Enzymes Recent Labs  Lab 12/21/18 0854 12/21/18 1336 12/21/18 1936 12/22/18 0120  TROPONINI 0.08* 0.07* 0.07* 0.07*   No results for input(s): TROPIPOC in the last 168 hours.  BNP Recent Labs  Lab 12/21/18 0856  BNP 978.0*     Radiology    Ct Head Wo Contrast  Result Date: 12/21/2018 CLINICAL DATA:  Dizziness and shortness of breath since yesterday. EXAM: CT HEAD WITHOUT CONTRAST TECHNIQUE: Contiguous axial images were obtained from the base of the skull through the vertex without intravenous contrast. COMPARISON:  March 20, 2013 FINDINGS: Brain: No evidence of acute infarction, hemorrhage, hydrocephalus, extra-axial collection or mass lesion/mass effect. There is mild chronic diffuse atrophy. Bilateral periventricular white matter small vessel ischemic changes are identified. Vascular: No hyperdense vessel is noted. Skull: Normal. Negative for fracture or focal lesion. Sinuses/Orbits: Minimal mucoperiosteal thickening of bilateral ethmoid sinuses are noted. The orbits are normal. Other: None. IMPRESSION: 1. No focal acute intracranial abnormality identified. 2. Chronic diffuse  atrophy. Chronic bilateral periventricular white matter small vessel ischemic change. Electronically Signed   By: Abelardo Diesel M.D.   On: 12/21/2018 11:23   Dg Chest Portable 1 View  Result Date: 12/21/2018 CLINICAL DATA:  dizziness and sob since yesterday. Denies cough, fever, or any pain. Reports dizziness comes and goes. Pt describes dizziness as room spinning EXAM: PORTABLE CHEST - 1 VIEW COMPARISON:  none FINDINGS: Relatively low lung volumes. Can not exclude mild central pulmonary vascular congestion. No confluent airspace infiltrate or overt edema. Cardiomegaly. No effusion. No pneumothorax. Visualized bones unremarkable. IMPRESSION: Cardiomegaly.  No focal infiltrate. Electronically Signed   By: Lucrezia Europe M.D.   On: 12/21/2018 10:32    Cardiac Studies   Echocardiogram 12/21/2018:  1. The left ventricle has a visually estimated ejection fraction of approximately 20%. Chamber size is normal and there is moderate left ventricular hypertrophy. Left ventricular diastolic Doppler parameters are indeterminate in the setting of atrial  flutter.  2. There is akinesis of the left ventricular, apical inferior wall.  3. There is akinesis of the left ventricular, basal inferior wall.  4. There is akinesis of the left ventricular, mid-apical anteroseptal wall.  5. The right ventricle has normal systolic function. The cavity was normal. There is no increase in right ventricular wall thickness. Right ventricular systolic pressure is mildly elevated with an estimated pressure of 38.2 mmHg.  6. Left atrial size was moderately dilated.  7. Right atrial size was moderately dilated.  8. Small pericardial effusion.  9. The pericardial effusion is localized near the right atrium. 10. The aortic valve is tricuspid. Aortic valve regurgitation is trivial by color flow Doppler. Mild aortic annular calcification noted. 11. The mitral valve is grossly normal. Mild thickening of the mitral valve leaflet. Mitral  valve regurgitation is mild to moderate by color flow Doppler. 12. The tricuspid valve is grossly normal. There is mild tricuspid regurgitation. 13. The inferior vena cava was dilated in size with <50% respiratory variability.  Patient Profile     63 y.o. male hypertension, hyperlipidemia, CKD stage III, alcohol abuse, and noncompliance now presenting with newly documented atrial flutter and cardiomyopathy.  Assessment & Plan    1.  Newly documented typical atrial flutter of uncertain duration, CHADSVASC score of 3.  Heart rate is better controlled, back on oral beta-blocker and now weaned off of intravenous diltiazem.  Currently on IV heparin.  2.  Uncontrolled hypertension, presented with hypertensive emergency.  This is in the setting of noncompliance.  3.  Newly documented cardiomyopathy with LVEF approximately 20%, moderate LVH, focal wall motion abnormalities also noted.  Possibly multifactorial due to uncontrolled hypertension, alcohol abuse, potential for tachycardia-mediated, and even underlying ischemic heart  disease.  Troponin I levels are minimally increased and in flat pattern consistent with demand ischemia rather than ACS.  4.  Noncompliance with medications and follow-up.  5.  Alcohol abuse.  6.  Acute on chronic renal failure with CKD stage III at baseline.  Creatinine 2.0.  7.  Mixed hyperlipidemia, currently on Pravachol.  I discussed the situation with the patient, reviewed recent studies and concerns.  I talked with him about the critical importance of alcohol cessation, also regular medication compliance and follow-up.  Although pursuing a cardiac catheterization plus minus revascularization and then TEE guided cardioversion could be considered in this case, I think we should optimize medical therapy first, transition to a DOAC when blood pressure is better, and then pursue further work-up as an outpatient.  This will give [place] the opportunity to see if he stays on his  medications and comes back to the office.  Switch from Lopressor to Coreg, start and uptitrate BiDil in lieu of hydralazine alone, agree with reinitiation of Norvasc.  Follow-up ECG to confirm current rhythm.  We will continue to follow with you.  Signed, Rozann Lesches, MD  12/22/2018, 8:56 AM

## 2018-12-22 NOTE — Progress Notes (Signed)
ANTICOAGULATION CONSULT NOTE -   Pharmacy Consult for apixaban Indication: atrial fibrillation  No Known Allergies  Patient Measurements: Height: 5\' 6"  (167.6 cm) Weight: 204 lb 12.9 oz (92.9 kg) IBW/kg (Calculated) : 63.8 Heparin Dosing Weight: 83 kg  Vital Signs: Temp: 98.1 F (36.7 C) (05/21 0600) Temp Source: Oral (05/21 0600) BP: 158/110 (05/21 0830) Pulse Rate: 92 (05/21 0830)  Labs: Recent Labs    12/21/18 0854 12/21/18 1336 12/21/18 1936 12/22/18 0120 12/22/18 0427  HGB 16.5  --   --   --  14.6  HCT 51.6  --   --   --  46.2  PLT 222  --   --   --  195  APTT 37*  --   --   --   --   LABPROT 15.1  --   --   --   --   INR 1.2  --   --   --   --   HEPARINUNFRC  --   --  0.42  --  0.22*  CREATININE 2.04*  --   --   --  2.03*  TROPONINI 0.08* 0.07* 0.07* 0.07*  --     Estimated Creatinine Clearance: 38.7 mL/min (A) (by C-G formula based on SCr of 2.03 mg/dL (H)).   Medical History: Past Medical History:  Diagnosis Date  . Alcohol abuse   . CKD (chronic kidney disease) stage 3, GFR 30-59 ml/min (HCC)   . Hyperlipidemia   . Hypertension   . Noncompliance     Medications:  Medications Prior to Admission  Medication Sig Dispense Refill Last Dose  . aspirin EC 81 MG tablet Take 81 mg by mouth daily. Reported on 10/29/2015   12/21/2018 at Unknown time    Assessment: Pharmacy consulted to transition from heparin to apixaban in patient with atrial fibrillation.     Goal of Therapy:   Monitor platelets by anticoagulation protocol: Yes    Plan:  Stop heparin infusion and give apixaban dose at same time Apixaban 5 mg PO twice daily. Continue to monitor H&H and platelets.   Revonda Standard Aryaa Bunting 12/22/2018,9:32 AM

## 2018-12-22 NOTE — Progress Notes (Signed)
PROGRESS NOTE    Timothy [name]  PIR:[id number] DOB: Apr 07, 1956 DOA: 12/21/2018 PCP: Soyla Dryer, PA-C    Brief Narrative:  63 year old male with significant past medical history for hypertension, dyslipidemia, chronic kidney disease and alcohol abuse who presents with dizziness for the last 48 hours.  He decreased his alcohol consumption about 5 days prior to admission.  On his initial physical examination his temperature was 98, blood pressure 187/143, heart rate 127, respiratory rate 26, oxygen saturation 97% on room air.  Dry mucous membranes, lungs clear to auscultation, heart S1-S2 present, irregular, abdomen soft nontender, no lower extremity edema.  Sodium 140, potassium 3.7, chloride 108, bicarb 22, glucose 155, BUN 28, creatinine 2.0, BNP 978, troponins 0.08, white count 6.5, hemoglobin 16.5, hematocrit 51.6, platelets 222.  SARS COVID-19 negative.  Urinalysis negative for infection.  Alcohol level less than 10, toxicology screen negative.  Head CT negative for acute changes.  Chest radiograph with cardiomegaly and vascular congestion.EKG 127 bpm, normal axis, atrial flutter, variable block, 1:1, 2:1, 3:1, positive LVH, precordial J-point elevation with poor R wave progression.  Patient will be admitted to the stepdown unit with a working diagnosis of hypertensive emergency complicated by atrial flutter with rapid ventricular response and cardiogenic pulmonary edema.   Assessment & Plan:   Principal Problem:   Hx of atrial flutter Active Problems:   Essential hypertension, benign   CKD (chronic kidney disease)   AKI (acute kidney injury) ([place])   Alcohol withdrawal syndrome with complication, with unspecified complication ([place])   1.  Atrial flutter with rapid ventricular response complicated by cardiogenic pulmonary edema due to acute systolic heart failure.  Patient has converted to sinus rhythm, will continue rate control with metoprolol. Diltiazem infusion has been held.  Continue telemetry monitoring. Echocardiogram with depressed LV systolic function down to 20%, with moderate left ventricular hypertrophy. Positive akinesis apical inferior, basal inferior, mid-apical anteroseptal of the left ventricle. Small pericardial effusion with no valvular significant valvular disease. Will continue metoprolol for beta blockade, will hold on RASS inhibition for now, until renal function more stable. Add isosorbide for after load reduction. Anticoagulation with apixaban. Urine output 1,300 ml over last 24 H, with improvement in his volume status.   2.  Hypertensive emergency.  Blood pressure continue to be uncontrolled, will add isosorbide to hydralazine, and will continue metoprolol and amlodipine.   3.  Alcohol abuse with positive withdrawal symptoms.  Alcohol withdrawal protocol with lorazepam - CIWA, he has used 1 mg over last 24 H.  Continue thiamine and multivitamins.  4.  Acute kidney injury on chronic kidney disease stage 2.  His renal function has remained stable, with serum cr at 2, will continue blood pressure control and close follow up or electrolytes. K at 4.0 this am.   5.  Dyslipidemia.  Continue with pravastatin.  6. Obesity. Calculated BMI is 33.   DVT prophylaxis: heparin   Code Status:  full Family Communication: no family at the bedside  Disposition Plan/ discharge barriers: pending clinical improvement.   Body mass index is 33.06 kg/m. Malnutrition Type:      Malnutrition Characteristics:      Nutrition Interventions:     RN Pressure Injury Documentation:     Consultants:   Cardiology   Procedures:     Antimicrobials:       Subjective: Patient is feeling better, but not yet back to baseline, his blood pressure continue to be elevated. No tremors or anxiety, no chest pain or dyspnea.  Objective: Vitals:   12/22/18 0500 12/22/18 0530 12/22/18 0600 12/22/18 0700  BP: (!) 181/128 (!) 167/112 (!) 164/121 (!) 160/105   Pulse: 91 92 95 94  Resp: 15 (!) 33 (!) 33 (!) 28  Temp:   98.1 F (36.7 C)   TempSrc:   Oral   SpO2: 98% 100% 100% 99%  Weight:   92.9 kg   Height:        Intake/Output Summary (Last 24 hours) at 12/22/2018 0745 Last data filed at 12/22/2018 0600 Gross per 24 hour  Intake 475.04 ml  Output 1300 ml  Net -824.96 ml   Filed Weights   12/21/18 0825 12/21/18 1239 12/22/18 0600  Weight: 90.7 kg 92.9 kg 92.9 kg    Examination:   General: deconditioned  Neurology: Awake and alert, non focal  E ENT: mild pallor, no icterus, oral mucosa moist Cardiovascular: No JVD. S1-S2 present, rhythmic/ regular, no gallops, rubs, or murmurs. Trace lower extremity edema. Pulmonary: positive breath sounds bilaterally, adequate air movement, no wheezing, rhonchi or rales. Gastrointestinal. Abdomen with no organomegaly, non tender, no rebound or guarding Skin. No rashes Musculoskeletal: no joint deformities     Data Reviewed: I have personally reviewed following labs and imaging studies  CBC: Recent Labs  Lab 12/21/18 0854 12/22/18 0427  WBC 6.5 6.4  NEUTROABS 3.8  --   HGB 16.5 14.6  HCT 51.6 46.2  MCV 82.2 82.4  PLT 222 086   Basic Metabolic Panel: Recent Labs  Lab 12/21/18 0854 12/22/18 0427  NA 140 140  K 3.7 4.0  CL 108 107  CO2 22 22  GLUCOSE 155* 128*  BUN 28* 26*  CREATININE 2.04* 2.03*  CALCIUM 8.9 8.9   GFR: Estimated Creatinine Clearance: 38.7 mL/min (A) (by C-G formula based on SCr of 2.03 mg/dL (H)). Liver Function Tests: Recent Labs  Lab 12/21/18 0854  AST 42*  ALT 56*  ALKPHOS 103  BILITOT 1.4*  PROT 7.1  ALBUMIN 4.2   No results for input(s): LIPASE, AMYLASE in the last 168 hours. No results for input(s): AMMONIA in the last 168 hours. Coagulation Profile: Recent Labs  Lab 12/21/18 0854  INR 1.2   Cardiac Enzymes: Recent Labs  Lab 12/21/18 0854 12/21/18 1336 12/21/18 1936 12/22/18 0120  TROPONINI 0.08* 0.07* 0.07* 0.07*   BNP (last 3  results) No results for input(s): PROBNP in the last 8760 hours. HbA1C: No results for input(s): HGBA1C in the last 72 hours. CBG: No results for input(s): GLUCAP in the last 168 hours. Lipid Profile: No results for input(s): CHOL, HDL, LDLCALC, TRIG, CHOLHDL, LDLDIRECT in the last 72 hours. Thyroid Function Tests: Recent Labs    12/21/18 1936  TSH 0.713   Anemia Panel: No results for input(s): VITAMINB12, FOLATE, FERRITIN, TIBC, IRON, RETICCTPCT in the last 72 hours.    Radiology Studies: I have reviewed all of the imaging during this hospital visit personally     Scheduled Meds: . amLODipine  10 mg Oral Daily  . aspirin EC  81 mg Oral Daily  . Chlorhexidine Gluconate Cloth  6 each Topical Q0600  . folic acid  1 mg Oral Daily  . hydrALAZINE  50 mg Oral Q8H  . metoprolol tartrate  100 mg Oral BID  . multivitamin with minerals  1 tablet Oral Daily  . pneumococcal 23 valent vaccine  0.5 mL Intramuscular Tomorrow-1000  . pravastatin  10 mg Oral q1800  . sodium chloride flush  3 mL Intravenous Q12H  . thiamine  100 mg Oral Daily   Or  . thiamine  100 mg Intravenous Daily   Continuous Infusions: . sodium chloride    . heparin 1,200 Units/hr (12/22/18 0713)     LOS: 1 day        Olie Dibert Gerome Apley, MD

## 2018-12-22 NOTE — Discharge Instructions (Signed)

## 2018-12-22 NOTE — Progress Notes (Signed)
ANTICOAGULATION CONSULT NOTE -   Pharmacy Consult for heparin Indication: atrial fibrillation  No Known Allergies  Patient Measurements: Height: 5\' 6"  (167.6 cm) Weight: 204 lb 12.9 oz (92.9 kg) IBW/kg (Calculated) : 63.8 Heparin Dosing Weight: 83 kg  Vital Signs: Temp: 98.1 F (36.7 C) (05/21 0600) Temp Source: Oral (05/21 0600) BP: 160/105 (05/21 0700) Pulse Rate: 94 (05/21 0700)  Labs: Recent Labs    12/21/18 0854 12/21/18 1336 12/21/18 1936 12/22/18 0120 12/22/18 0427  HGB 16.5  --   --   --  14.6  HCT 51.6  --   --   --  46.2  PLT 222  --   --   --  195  APTT 37*  --   --   --   --   LABPROT 15.1  --   --   --   --   INR 1.2  --   --   --   --   HEPARINUNFRC  --   --  0.42  --  0.22*  CREATININE 2.04*  --   --   --  2.03*  TROPONINI 0.08* 0.07* 0.07* 0.07*  --     Estimated Creatinine Clearance: 38.7 mL/min (A) (by C-G formula based on SCr of 2.03 mg/dL (H)).   Medical History: Past Medical History:  Diagnosis Date  . Alcohol abuse   . CKD (chronic kidney disease) stage 3, GFR 30-59 ml/min (HCC)   . Hyperlipidemia   . Hypertension   . Noncompliance     Medications:  Medications Prior to Admission  Medication Sig Dispense Refill Last Dose  . aspirin EC 81 MG tablet Take 81 mg by mouth daily. Reported on 10/29/2015   12/21/2018 at Unknown time    Assessment: Pharmacy consulted to dose heparin in patient with atrial fibrillation.  Patient not on anticoagulation prior to admission.  HL subtherapeutic at 0.22  Goal of Therapy:  Heparin level 0.3-0.7 units/ml Monitor platelets by anticoagulation protocol: Yes   HL 0.47   Plan:  Rebolus heparin 1200 units x 1  Increase heparin infusion to 1350 units/hr Check anti-Xa level daily while on heparin. Continue to monitor H&H and platelets.   Ramond Craver 12/22/2018,8:04 AM

## 2018-12-23 DIAGNOSIS — I429 Cardiomyopathy, unspecified: Secondary | ICD-10-CM

## 2018-12-23 DIAGNOSIS — F1010 Alcohol abuse, uncomplicated: Secondary | ICD-10-CM

## 2018-12-23 DIAGNOSIS — I483 Typical atrial flutter: Secondary | ICD-10-CM

## 2018-12-23 DIAGNOSIS — I5021 Acute systolic (congestive) heart failure: Secondary | ICD-10-CM

## 2018-12-23 DIAGNOSIS — N183 Chronic kidney disease, stage 3 (moderate): Secondary | ICD-10-CM

## 2018-12-23 LAB — CBC
HCT: 44.5 % (ref 39.0–52.0)
Hemoglobin: 13.9 g/dL (ref 13.0–17.0)
MCH: 25.9 pg — ABNORMAL LOW (ref 26.0–34.0)
MCHC: 31.2 g/dL (ref 30.0–36.0)
MCV: 83 fL (ref 80.0–100.0)
Platelets: 190 10*3/uL (ref 150–400)
RBC: 5.36 MIL/uL (ref 4.22–5.81)
RDW: 14.6 % (ref 11.5–15.5)
WBC: 5.8 10*3/uL (ref 4.0–10.5)
nRBC: 0 % (ref 0.0–0.2)

## 2018-12-23 LAB — BASIC METABOLIC PANEL
Anion gap: 10 (ref 5–15)
BUN: 30 mg/dL — ABNORMAL HIGH (ref 8–23)
CO2: 21 mmol/L — ABNORMAL LOW (ref 22–32)
Calcium: 8.6 mg/dL — ABNORMAL LOW (ref 8.9–10.3)
Chloride: 107 mmol/L (ref 98–111)
Creatinine, Ser: 1.95 mg/dL — ABNORMAL HIGH (ref 0.61–1.24)
GFR calc Af Amer: 41 mL/min — ABNORMAL LOW (ref >60)
GFR calc non Af Amer: 35 mL/min — ABNORMAL LOW (ref >60)
Glucose, Bld: 125 mg/dL — ABNORMAL HIGH (ref 70–99)
Potassium: 3.8 mmol/L (ref 3.5–5.1)
Sodium: 138 mmol/L (ref 135–145)

## 2018-12-23 MED ORDER — FUROSEMIDE 40 MG PO TABS
40.0000 mg | ORAL_TABLET | Freq: Every day | ORAL | Status: DC
  Administered 2018-12-23: 40 mg via ORAL
  Filled 2018-12-23: qty 1

## 2018-12-23 MED ORDER — HYDRALAZINE HCL 25 MG PO TABS: 25.0000 mg | ORAL_TABLET | Freq: Three times a day (TID) | ORAL | 0 refills | Status: DC

## 2018-12-23 MED ORDER — CARVEDILOL 25 MG PO TABS: 25.0000 mg | ORAL_TABLET | Freq: Two times a day (BID) | ORAL | 0 refills | Status: DC

## 2018-12-23 MED ORDER — FUROSEMIDE 40 MG PO TABS: 40.0000 mg | ORAL_TABLET | Freq: Every day | ORAL | 0 refills | Status: DC

## 2018-12-23 MED ORDER — PRAVASTATIN SODIUM 10 MG PO TABS: 10.0000 mg | ORAL_TABLET | Freq: Every day | ORAL | 0 refills | Status: DC

## 2018-12-23 MED ORDER — APIXABAN 5 MG PO TABS: 5.0000 mg | ORAL_TABLET | Freq: Two times a day (BID) | ORAL | 0 refills | Status: DC

## 2018-12-23 MED ORDER — AMLODIPINE BESYLATE 10 MG PO TABS: 10.0000 mg | ORAL_TABLET | Freq: Every day | ORAL | 0 refills | Status: DC

## 2018-12-23 MED ORDER — ISOSORB DINITRATE-HYDRALAZINE 20-37.5 MG PO TABS
2.0000 | ORAL_TABLET | Freq: Three times a day (TID) | ORAL | Status: DC
  Administered 2018-12-23 (×2): 2 via ORAL
  Filled 2018-12-23 (×10): qty 2

## 2018-12-23 MED ORDER — ISOSORBIDE DINITRATE 20 MG PO TABS: 20.0000 mg | ORAL_TABLET | Freq: Three times a day (TID) | ORAL | 0 refills | Status: DC

## 2018-12-23 NOTE — Care Management Important Message (Signed)
Important Message  Patient Details  Name: Timothy [name] MRN: [id number] Date of Birth: [birth year]   Medicare Important Message Given:  Yes    Tommy Medal 12/23/2018, 4:17 PM

## 2018-12-23 NOTE — Progress Notes (Signed)
Discharge instructions given to patient. Informed that prescriptions have been sent to his pharmacy in [place]. Voucher for eliquis also provided. Pt made aware of appointment with [facility]. Pt stated he drove to the hospital and is driving himself home.

## 2018-12-23 NOTE — Progress Notes (Addendum)
PROGRESS NOTE    Timothy [name]  DVV:[id number] DOB: 1956-05-10 DOA: 12/21/2018 PCP: No primary care provider on file.    Brief Narrative:  63 year old male with significant past medical history for hypertension, dyslipidemia, chronic kidney disease and alcohol abuse who presents with dizziness for the last 48 hours. He decreased his alcohol consumption about 5 days prior to admission. On his initial physical examination his temperature was 98, blood pressure 187/143, heart rate 127, respiratory rate 26, oxygen saturation 97% on room air. Dry mucous membranes, lungs clear to auscultation, heart S1-S2 present, irregular,abdomen soft nontender, no lower extremity edema. Sodium 140, potassium 3.7, chloride 108, bicarb 22, glucose 155, BUN 28, creatinine 2.0, BNP 978, troponins 0.08,white count 6.5, hemoglobin 16.5, hematocrit 51.6, platelets 222.SARS COVID-19 negative. Urinalysis negative for infection. Alcohol level less than 10, toxicology screen negative. Head CT negative for acute changes. Chest radiograph with cardiomegaly and vascular congestion.EKG 127 bpm, normal axis, atrial flutter, variable block, 1:1, 2:1, 3:1, positive LVH, precordial J-point elevation with poor R wave progression.  Patient will be admitted to the stepdown unit with a working diagnosis of hypertensive emergency complicated by atrial flutter with rapid ventricular response and cardiogenic pulmonary edema.    Assessment & Plan:   Principal Problem:   Hx of atrial flutter Active Problems:   Essential hypertension, benign   CKD (chronic kidney disease)   AKI (acute kidney injury) ([place])   Alcohol withdrawal syndrome with complication, with unspecified complication (HCC)   Secondary cardiomyopathy (HCC)   Typical atrial flutter ([place])   Hypertensive emergency   Noncompliance   Acute renal failure superimposed on stage 3 chronic kidney disease ([place])   Alcohol abuse   1.Atrial flutter with rapid  ventricular response complicated by cardiogenic pulmonary edema due to acute systolic heart failure decompensation/ suspected possible ischemic cardiomyopathy (LV EF 20% with akinesis left ventricular apical and basal inferior wall, mid-apical antero-spetal).Patient has remained in sinus rhythm over last 24H, EKG personally reviewed, 84 bpm, sinus with 1st degree AV block, non conducting PAC, left axis deviation with interventricular conduction delay, lateral T wave inversions, and q waves III-AVF, J point elevation V2to V4). Volume status has improved. Continue rate control with carvedilol 25 mg bid and anticoagulation with apixaban. Will start patient on furosemide 40 mg daily and will increase Bidil to 2 tabs daily. He will benefit from entresto when renal function more stable. Continue aspirin and atorvastatin. Patient is chest pain free.   2.Hypertensive emergency. Continue to be uncontrolled, with 157/117 and 162/116 this am. Will continue amlodipine, carvedilol, and will increase Bidil. Add diuretic with furosemide.   3.Alcohol abuse with positive withdrawal symptoms. No significant withdrawal symptoms, continue as needed alprazolam. Has not use any dose over last 24 H.   4.Acute kidney injury onchronic kidney disease stage 2.follow renal panel this am. Will start patient on furosemide to keep negative fluid balance.   5.Dyslipidemia. On pravastatin.  6. Obesity. Calculated BMI is 33. Will need outpatient follow up.    DVT prophylaxis: heparin   Code Status:  full Family Communication: no family at the bedside  Disposition Plan/ discharge barriers: pending clinical improvement.   Body mass index is 33.23 kg/m. Malnutrition Type:      Malnutrition Characteristics:      Nutrition Interventions:     RN Pressure Injury Documentation:     Consultants:   Cardiology   Procedures:     Antimicrobials:       Subjective: Patient is feeling better,  no  chest pain and dyspnea continue to improve, no nausea or vomiting.   Objective: Vitals:   12/23/18 0530 12/23/18 0600 12/23/18 0630 12/23/18 0700  BP: (!) 156/113 (!) 171/100 (!) 157/117 (!) 162/116  Pulse: 66 71 71 71  Resp: (!) 29 (!) 30 (!) 32 (!) 27  Temp:      TempSrc:      SpO2: 99% 91% 100% 100%  Weight:      Height:        Intake/Output Summary (Last 24 hours) at 12/23/2018 0731 Last data filed at 12/23/2018 0100 Gross per 24 hour  Intake 84.38 ml  Output 1475 ml  Net -1390.62 ml   Filed Weights   12/21/18 1239 12/22/18 0600 12/23/18 0445  Weight: 92.9 kg 92.9 kg 93.4 kg    Examination:   General: deconditioned  Neurology: Awake and alert, non focal  E ENT: mild pallor, no icterus, oral mucosa moist Cardiovascular: No JVD. S1-S2 present, rhythmic, no gallops, rubs, or murmurs. Trace lower extremity edema. Pulmonary: positive breath sounds bilaterally, adequate air movement, no wheezing, rhonchi or rales. Gastrointestinal. Abdomen with no organomegaly, non tender, no rebound or guarding Skin. No rashes Musculoskeletal: no joint deformities     Data Reviewed: I have personally reviewed following labs and imaging studies  CBC: Recent Labs  Lab 12/21/18 0854 12/22/18 0427 12/23/18 0549  WBC 6.5 6.4 5.8  NEUTROABS 3.8  --   --   HGB 16.5 14.6 13.9  HCT 51.6 46.2 44.5  MCV 82.2 82.4 83.0  PLT 222 195 267   Basic Metabolic Panel: Recent Labs  Lab 12/21/18 0854 12/22/18 0427  NA 140 140  K 3.7 4.0  CL 108 107  CO2 22 22  GLUCOSE 155* 128*  BUN 28* 26*  CREATININE 2.04* 2.03*  CALCIUM 8.9 8.9   GFR: Estimated Creatinine Clearance: 38.8 mL/min (A) (by C-G formula based on SCr of 2.03 mg/dL (H)). Liver Function Tests: Recent Labs  Lab 12/21/18 0854  AST 42*  ALT 56*  ALKPHOS 103  BILITOT 1.4*  PROT 7.1  ALBUMIN 4.2   No results for input(s): LIPASE, AMYLASE in the last 168 hours. No results for input(s): AMMONIA in the last 168 hours.  Coagulation Profile: Recent Labs  Lab 12/21/18 0854  INR 1.2   Cardiac Enzymes: Recent Labs  Lab 12/21/18 0854 12/21/18 1336 12/21/18 1936 12/22/18 0120  TROPONINI 0.08* 0.07* 0.07* 0.07*   BNP (last 3 results) No results for input(s): PROBNP in the last 8760 hours. HbA1C: No results for input(s): HGBA1C in the last 72 hours. CBG: No results for input(s): GLUCAP in the last 168 hours. Lipid Profile: No results for input(s): CHOL, HDL, LDLCALC, TRIG, CHOLHDL, LDLDIRECT in the last 72 hours. Thyroid Function Tests: Recent Labs    12/21/18 1936  TSH 0.713   Anemia Panel: No results for input(s): VITAMINB12, FOLATE, FERRITIN, TIBC, IRON, RETICCTPCT in the last 72 hours.    Radiology Studies: I have reviewed all of the imaging during this hospital visit personally     Scheduled Meds: . amLODipine  10 mg Oral Daily  . apixaban  5 mg Oral BID  . aspirin EC  81 mg Oral Daily  . carvedilol  25 mg Oral BID WC  . Chlorhexidine Gluconate Cloth  6 each Topical Q0600  . folic acid  1 mg Oral Daily  . isosorbide-hydrALAZINE  1 tablet Oral TID  . multivitamin with minerals  1 tablet Oral Daily  . pravastatin  10 mg  Oral q1800  . sodium chloride flush  3 mL Intravenous Q12H  . thiamine  100 mg Oral Daily   Or  . thiamine  100 mg Intravenous Daily   Continuous Infusions: . sodium chloride       LOS: 2 days        Pasquale Matters Gerome Apley, MD

## 2018-12-23 NOTE — Evaluation (Signed)
Physical Therapy Evaluation Patient Details Name: Timothy [name] MRN: [id number] DOB: 12-20-1955 Today's Date: 12/23/2018   History of Present Illness  Timothy [name] is a 63 y.o. male with medical history significant of hypertension, dyslipidemia, chronic kidney disease and alcohol abuse.  Patient presents with dizziness for the last 48 hours.  His dizziness has been persistent, moderate in intensity, no improving or worsening factors, no associated nausea, vomiting, disbalance or vertigo.  He usually drinks a sixpack of beer daily, he stopped drinking 5 days ago, but he had a small amount of liquor last night.  Denies tobacco abuse or illicit drug abuse.    Clinical Impression  Patient functioning at baseline for functional mobility and gait.  Plan:  Patient discharged from physical therapy to care of nursing for ambulation daily as tolerated for length of stay.     Follow Up Recommendations No PT follow up    Equipment Recommendations  None recommended by PT    Recommendations for Other Services       Precautions / Restrictions Precautions Precautions: None Restrictions Weight Bearing Restrictions: No      Mobility  Bed Mobility Overal bed mobility: Independent                Transfers Overall transfer level: Independent                  Ambulation/Gait Ambulation/Gait assistance: Modified independent (Device/Increase time) Gait Distance (Feet): 200 Feet Assistive device: None Gait Pattern/deviations: WFL(Within Functional Limits) Gait velocity: normal   General Gait Details: demonstrates good return for ambulation in room and hallways without loss of balance  Stairs            Wheelchair Mobility    Modified Rankin (Stroke Patients Only)       Balance Overall balance assessment: No apparent balance deficits (not formally assessed)                                           Pertinent Vitals/Pain Pain Assessment:  No/denies pain    Home Living Family/patient expects to be discharged to:: Private residence Living Arrangements: Spouse/significant other Available Help at Discharge: Family Type of Home: House Home Access: Stairs to enter Entrance Stairs-Rails: None Technical brewer of Steps: 1 Home Layout: Two level;Bed/bath upstairs Home Equipment: None      Prior Function Level of Independence: Independent         Comments: community ambulator, drives     Journalist, newspaper        Extremity/Trunk Assessment   Upper Extremity Assessment Upper Extremity Assessment: Overall WFL for tasks assessed    Lower Extremity Assessment Lower Extremity Assessment: Overall WFL for tasks assessed    Cervical / Trunk Assessment Cervical / Trunk Assessment: Normal  Communication   Communication: No difficulties  Cognition Arousal/Alertness: Awake/alert Behavior During Therapy: WFL for tasks assessed/performed Overall Cognitive Status: Within Functional Limits for tasks assessed                                        General Comments      Exercises     Assessment/Plan    PT Assessment Patent does not need any further PT services  PT Problem List         PT Treatment Interventions  PT Goals (Current goals can be found in the Care Plan section)  Acute Rehab PT Goals Patient Stated Goal: return home PT Goal Formulation: With patient Time For Goal Achievement: 12/23/18 Potential to Achieve Goals: Good    Frequency     Barriers to discharge        Co-evaluation               AM-PAC PT "6 Clicks" Mobility  Outcome Measure Help needed turning from your back to your side while in a flat bed without using bedrails?: None Help needed moving from lying on your back to sitting on the side of a flat bed without using bedrails?: None Help needed moving to and from a bed to a chair (including a wheelchair)?: None Help needed standing up from a chair  using your arms (e.g., wheelchair or bedside chair)?: None Help needed to walk in hospital room?: None Help needed climbing 3-5 steps with a railing? : None 6 Click Score: 24    End of Session   Activity Tolerance: Patient tolerated treatment well Patient left: in chair Nurse Communication: Mobility status PT Visit Diagnosis: Unsteadiness on feet (R26.81);Other abnormalities of gait and mobility (R26.89);Muscle weakness (generalized) (M62.81)    Time: 8867-7373 PT Time Calculation (min) (ACUTE ONLY): 21 min   Charges:   PT Evaluation $PT Eval Moderate Complexity: 1 Mod PT Treatments $Gait Training: 8-22 mins        11:49 AM, 12/23/18 Lonell Grandchild, MPT Physical Therapist with [facility] [phone] office [phone] mobile phone

## 2018-12-23 NOTE — TOC Transition Note (Signed)
Transition of Care [facility]) - CM/SW Discharge Note   Patient Details  Name: Timothy [name] MRN: [id number] Date of Birth: 1955/11/19  [facility] CM/SW Contact:  Sherald Barge, RN Phone Number: 12/23/2018, 11:58 AM   Clinical Narrative:   Follow up appointment confirmed with [facility]. Pt provided 30-day voucher and financial assistance paper work to be completed by f/u provider. PT recommends no f/u. TOC will sign off. May be consulted should needs arise.     Final next level of care: Home/Self Care Barriers to Discharge: No Barriers Identified   Patient Goals and CMS Choice Patient states their goals for this hospitalization and ongoing recovery are:: get home ASAP      Discharge Placement                       Discharge Plan and Services   Discharge Planning Services: CM Consult                                 Social Determinants of Health (SDOH) Interventions     Readmission Risk Interventions Readmission Risk Prevention Plan 12/23/2018  Post Dischage Appt Complete  Medication Screening Complete  Transportation Screening Complete  Some recent data might be hidden

## 2018-12-23 NOTE — Discharge Summary (Signed)
Physician Discharge Summary  Timothy [name] IRJ:[id number] DOB: [birth year] DOA: 12/21/2018  PCP: Alanson Puls [place] date: 12/21/2018 Discharge date: 12/23/2018  Admitted From: Home  Disposition:  Home   Recommendations for Outpatient Follow-up and new medication changes:  1. Follow up with [facility] on 12/28/18. 2:30 pm  2. Patient has been placed on anticoagulation with apixaban (cupon for 30 days)  3. Started on heart failure regimen with hydralazine, isosorbide, carvedilol and furosemide. (no Bidil due to poor access to medications) 4. Blood pressure control with amlodipine.  Home Health: no   Equipment/Devices: no    Discharge Condition: stable  CODE STATUS: full  Diet recommendation: heart healthy   Brief/Interim Summary: 63 year old male with significant past medical history for hypertension, dyslipidemia, chronic kidney disease and alcohol abuse who presents with dizziness for the last 48 hours. He decreasedhis alcohol consumption about 5 days prior to admission. On his initial physical examination his temperature was 98, blood pressure 187/143, heart rate 127, respiratory rate 26, oxygen saturation 97% on room air. Dry mucous membranes, lungs clear to auscultation, heart S1-S2 present, irregular,abdomen soft nontender, no lower extremity edema. Sodium 140, potassium 3.7, chloride 108, bicarb 22, glucose 155, BUN 28, creatinine 2.0, BNP 978, troponins 0.08,white count 6.5, hemoglobin 16.5, hematocrit 51.6, platelets 222.SARS COVID-19 negative. Urinalysis negative for infection. Alcohol level less than 10, toxicology screen negative. Head CT negative for acute changes. Chest radiograph with cardiomegaly and vascular congestion.EKG127 bpm, normal axis, atrial flutter, variable block, 1:1, 2:1, 3:1, positive LVH, precordial J-point elevation with poor R wave progression.  Patient will be admitted to the stepdown unit with a working diagnosis of  hypertensive emergency complicated by atrial flutter with rapid ventricular response and cardiogenic pulmonary edema.  1.  Atrial flutter with rapid ventricular response, complicated by acute cardiogenic pulmonary edema due to acute systolic heart failure decompensation.  Patient was admitted to the stepdown unit, he was initially treated with continuous infusion of IV diltiazem for rate control, and oral beta-blockade was added.  Initially treated with metoprolol and then transitioned to carvedilol with good toleration.  He converted to sinus rhythm.  He was anticoagulated with IV heparin and then transitioned to oral apixaban.  Further work-up with echocardiogram showed left ventricle ejection fraction 20% with akinesis of the left ventricular apical and basal inferior wall, akinesis of the mid apical anteroseptal wall.  He required diuresis with IV furosemide.  He was placed on hydralazine and isosorbide for afterload reduction.  Patient was seen by cardiology with recommendations to optimize medical therapy and follow-up as an outpatient.  He may need outpatient ischemic work-up.  2.  Hypertensive emergency.  Patient required further titration of antihypertensive agents, but discharged on amlodipine, carvedilol, isosorbide and hydralazine.  Continue diuresis with oral furosemide.  3.  Acute kidney injury chronic disease stage II.  Patient tolerated well diuresis with IV furosemide, his discharge creatinine is 1.95, potassium 3.8 and serum bicarbonate 21.  Recommend close follow-up of kidney function as an outpatient.  4.  Alcohol abuse with alcohol withdrawal syndrome.  She was placed on lorazepam per CIWA protocol with good toleration.  By time of discharge his withdrawal symptoms had resolved.   5.  Dyslipidemia.  Continue pravastatin.  6.  Obesity.  Calculated BMI 33, follow-up as an outpatient.  Discharge Diagnoses:  Principal Problem:   Hx of atrial flutter Active Problems:   Essential  hypertension, benign   CKD (chronic kidney disease)   AKI (acute kidney  injury) ([place])   Alcohol withdrawal syndrome with complication, with unspecified complication ([place])   Secondary cardiomyopathy ([place])   Typical atrial flutter ([place])   Hypertensive emergency   Noncompliance   Acute renal failure superimposed on stage 3 chronic kidney disease ([place])   Alcohol abuse    Discharge Instructions   Allergies as of 12/23/2018   No Known Allergies     Medication List    STOP taking these medications   aspirin EC 81 MG tablet   lovastatin 20 MG tablet Commonly known as:  MEVACOR Replaced by:  pravastatin 10 MG tablet     TAKE these medications   amLODipine 10 MG tablet Commonly known as:  NORVASC Take 1 tablet (10 mg total) by mouth daily for 30 days. Start taking on:  Dec 24, 2018   apixaban 5 MG Tabs tablet Commonly known as:  ELIQUIS Take 1 tablet (5 mg total) by mouth 2 (two) times daily for 30 days.   carvedilol 25 MG tablet Commonly known as:  COREG Take 1 tablet (25 mg total) by mouth 2 (two) times daily with a meal for 30 days.   furosemide 40 MG tablet Commonly known as:  LASIX Take 1 tablet (40 mg total) by mouth daily for 30 days. Start taking on:  Dec 24, 2018   hydrALAZINE 25 MG tablet Commonly known as:  APRESOLINE Take 1 tablet (25 mg total) by mouth 3 (three) times daily for 30 days.   isosorbide dinitrate 20 MG tablet Commonly known as:  ISORDIL Take 1 tablet (20 mg total) by mouth 3 (three) times daily for 30 days.   pravastatin 10 MG tablet Commonly known as:  PRAVACHOL Take 1 tablet (10 mg total) by mouth daily at 6 PM for 30 days. Replaces:  lovastatin 20 MG tablet      Follow-up Information    [facility] Follow up on 12/28/2018.   Why:  @ 2:30 pm Contact information: [place] [address] [phone]          No Known Allergies  Consultations:  Cardiology    Procedures/Studies: Ct Head Wo  Contrast  Result Date: 12/21/2018 CLINICAL DATA:  Dizziness and shortness of breath since yesterday. EXAM: CT HEAD WITHOUT CONTRAST TECHNIQUE: Contiguous axial images were obtained from the base of the skull through the vertex without intravenous contrast. COMPARISON:  March 20, 2013 FINDINGS: Brain: No evidence of acute infarction, hemorrhage, hydrocephalus, extra-axial collection or mass lesion/mass effect. There is mild chronic diffuse atrophy. Bilateral periventricular white matter small vessel ischemic changes are identified. Vascular: No hyperdense vessel is noted. Skull: Normal. Negative for fracture or focal lesion. Sinuses/Orbits: Minimal mucoperiosteal thickening of bilateral ethmoid sinuses are noted. The orbits are normal. Other: None. IMPRESSION: 1. No focal acute intracranial abnormality identified. 2. Chronic diffuse atrophy. Chronic bilateral periventricular white matter small vessel ischemic change. Electronically Signed   By: Abelardo Diesel M.D.   On: 12/21/2018 11:23   Dg Chest Portable 1 View  Result Date: 12/21/2018 CLINICAL DATA:  dizziness and sob since yesterday. Denies cough, fever, or any pain. Reports dizziness comes and goes. Pt describes dizziness as room spinning EXAM: PORTABLE CHEST - 1 VIEW COMPARISON:  none FINDINGS: Relatively low lung volumes. Can not exclude mild central pulmonary vascular congestion. No confluent airspace infiltrate or overt edema. Cardiomegaly. No effusion. No pneumothorax. Visualized bones unremarkable. IMPRESSION: Cardiomegaly.  No focal infiltrate. Electronically Signed   By: Lucrezia Europe M.D.   On: 12/21/2018  10:32      Procedures:   Subjective: Patient is feeling back to his normal self, his edema has improved, no dyspnea, chest pain or palpitations.   Discharge Exam: Vitals:   12/23/18 1400 12/23/18 1500  BP: 125/87   Pulse: 70   Resp:  (!) 25  Temp:    SpO2: 100%    Vitals:   12/23/18 1316 12/23/18 1326 12/23/18 1400 12/23/18 1500   BP: 120/81  125/87   Pulse: 65 67 70   Resp: 11 (!) 33  (!) 25  Temp:      TempSrc:      SpO2: 94% 99% 100%   Weight:      Height:        General: Not in pain or dyspnea  Neurology: Awake and alert, non focal  E ENT: no pallor, no icterus, oral mucosa moist Cardiovascular: No JVD. S1-S2 present, rhythmic, no gallops, rubs, or murmurs. No lower extremity edema. Pulmonary: vesicular breath sounds bilaterally, adequate air movement, no wheezing, rhonchi or rales. Gastrointestinal. Abdomen with no organomegaly, non tender, no rebound or guarding Skin. No rashes Musculoskeletal: no joint deformities   The results of significant diagnostics from this hospitalization (including imaging, microbiology, ancillary and laboratory) are listed below for reference.     Microbiology: Recent Results (from the past 240 hour(s))  SARS Coronavirus 2 (CEPHEID - Performed in [place] hospital lab), Hosp Order     Status: None   Collection Time: 12/21/18  9:32 AM  Result Value Ref Range Status   SARS Coronavirus 2 NEGATIVE NEGATIVE Final    Comment: (NOTE) If result is NEGATIVE SARS-CoV-2 target nucleic acids are NOT DETECTED. The SARS-CoV-2 RNA is generally detectable in upper and lower  respiratory specimens during the acute phase of infection. The lowest  concentration of SARS-CoV-2 viral copies this assay can detect is 250  copies / mL. A negative result does not preclude SARS-CoV-2 infection  and should not be used as the sole basis for treatment or other  patient management decisions.  A negative result may occur with  improper specimen collection / handling, submission of specimen other  than nasopharyngeal swab, presence of viral mutation(s) within the  areas targeted by this assay, and inadequate number of viral copies  (<250 copies / mL). A negative result must be combined with clinical  observations, patient history, and epidemiological information. If result is  POSITIVE SARS-CoV-2 target nucleic acids are DETECTED. The SARS-CoV-2 RNA is generally detectable in upper and lower  respiratory specimens dur ing the acute phase of infection.  Positive  results are indicative of active infection with SARS-CoV-2.  Clinical  correlation with patient history and other diagnostic information is  necessary to determine patient infection status.  Positive results do  not rule out bacterial infection or co-infection with other viruses. If result is PRESUMPTIVE POSTIVE SARS-CoV-2 nucleic acids MAY BE PRESENT.   A presumptive positive result was obtained on the submitted specimen  and confirmed on repeat testing.  While 2019 novel coronavirus  (SARS-CoV-2) nucleic acids may be present in the submitted sample  additional confirmatory testing may be necessary for epidemiological  and / or clinical management purposes  to differentiate between  SARS-CoV-2 and other Sarbecovirus currently known to infect humans.  If clinically indicated additional testing with an alternate test  methodology [phone]) is advised. The SARS-CoV-2 RNA is generally  detectable in upper and lower respiratory sp ecimens during the acute  phase of infection. The expected result is  Negative. Fact Sheet for Patients:  StrictlyIdeas.no Fact Sheet for Healthcare Providers: BankingDealers.co.za This test is not yet approved or cleared by the [place] FDA and has been authorized for detection and/or diagnosis of SARS-CoV-2 by FDA under an Emergency Use Authorization (EUA).  This EUA will remain in effect (meaning this test can be used) for the duration of the COVID-19 declaration under Section 564(b)(1) of the Act, 21 U.S.C. section 360bbb-3(b)(1), unless the authorization is terminated or revoked sooner. Performed at [facility], [address] [place], [place] [address]   MRSA PCR Screening     Status: None   Collection Time: 12/21/18  12:41 PM  Result Value Ref Range Status   MRSA by PCR NEGATIVE NEGATIVE Final    Comment:        The GeneXpert MRSA Assay (FDA approved for NASAL specimens only), is one component of a comprehensive MRSA colonization surveillance program. It is not intended to diagnose MRSA infection nor to guide or monitor treatment for MRSA infections. Performed at [facility], [address], [place], [place] [address]      Labs: BNP (last 3 results) Recent Labs    12/21/18 0856  BNP 676.1*   Basic Metabolic Panel: Recent Labs  Lab 12/21/18 0854 12/22/18 0427 12/23/18 0549  NA 140 140 138  K 3.7 4.0 3.8  CL 108 107 107  CO2 22 22 21*  GLUCOSE 155* 128* 125*  BUN 28* 26* 30*  CREATININE 2.04* 2.03* 1.95*  CALCIUM 8.9 8.9 8.6*   Liver Function Tests: Recent Labs  Lab 12/21/18 0854  AST 42*  ALT 56*  ALKPHOS 103  BILITOT 1.4*  PROT 7.1  ALBUMIN 4.2   No results for input(s): LIPASE, AMYLASE in the last 168 hours. No results for input(s): AMMONIA in the last 168 hours. CBC: Recent Labs  Lab 12/21/18 0854 12/22/18 0427 12/23/18 0549  WBC 6.5 6.4 5.8  NEUTROABS 3.8  --   --   HGB 16.5 14.6 13.9  HCT 51.6 46.2 44.5  MCV 82.2 82.4 83.0  PLT 222 195 190   Cardiac Enzymes: Recent Labs  Lab 12/21/18 0854 12/21/18 1336 12/21/18 1936 12/22/18 0120  TROPONINI 0.08* 0.07* 0.07* 0.07*   BNP: Invalid input(s): POCBNP CBG: No results for input(s): GLUCAP in the last 168 hours. D-Dimer No results for input(s): DDIMER in the last 72 hours. Hgb A1c No results for input(s): HGBA1C in the last 72 hours. Lipid Profile No results for input(s): CHOL, HDL, LDLCALC, TRIG, CHOLHDL, LDLDIRECT in the last 72 hours. Thyroid function studies Recent Labs    12/21/18 1936  TSH 0.713   Anemia work up No results for input(s): VITAMINB12, FOLATE, FERRITIN, TIBC, IRON, RETICCTPCT in the last 72 hours. Urinalysis    Component Value Date/Time   COLORURINE YELLOW 12/21/2018  0930   APPEARANCEUR HAZY (A) 12/21/2018 0930   LABSPEC 1.020 12/21/2018 0930   PHURINE 5.0 12/21/2018 0930   GLUCOSEU NEGATIVE 12/21/2018 0930   HGBUR SMALL (A) 12/21/2018 0930   BILIRUBINUR NEGATIVE 12/21/2018 0930   KETONESUR NEGATIVE 12/21/2018 0930   PROTEINUR 100 (A) 12/21/2018 0930   UROBILINOGEN 0.2 02/03/2008 1342   NITRITE NEGATIVE 12/21/2018 0930   LEUKOCYTESUR NEGATIVE 12/21/2018 0930   Sepsis Labs Invalid input(s): PROCALCITONIN,  WBC,  LACTICIDVEN Microbiology Recent Results (from the past 240 hour(s))  SARS Coronavirus 2 (CEPHEID - Performed in [place] hospital lab), Hosp Order     Status: None   Collection Time: 12/21/18  9:32 AM  Result Value Ref Range Status   SARS Coronavirus 2 NEGATIVE NEGATIVE Final    Comment: (NOTE) If result is NEGATIVE SARS-CoV-2 target nucleic acids are NOT DETECTED. The SARS-CoV-2 RNA is generally detectable in upper and lower  respiratory specimens during the acute phase of infection. The lowest  concentration of SARS-CoV-2 viral copies this assay can detect is 250  copies / mL. A negative result does not preclude SARS-CoV-2 infection  and should not be used as the sole basis for treatment or other  patient management decisions.  A negative result may occur with  improper specimen collection / handling, submission of specimen other  than nasopharyngeal swab, presence of viral mutation(s) within the  areas targeted by this assay, and inadequate number of viral copies  (<250 copies / mL). A negative result must be combined with clinical  observations, patient history, and epidemiological information. If result is POSITIVE SARS-CoV-2 target nucleic acids are DETECTED. The SARS-CoV-2 RNA is generally detectable in upper and lower  respiratory specimens dur ing the acute phase of infection.  Positive  results are indicative of active infection with SARS-CoV-2.  Clinical  correlation with patient history and other diagnostic  information is  necessary to determine patient infection status.  Positive results do  not rule out bacterial infection or co-infection with other viruses. If result is PRESUMPTIVE POSTIVE SARS-CoV-2 nucleic acids MAY BE PRESENT.   A presumptive positive result was obtained on the submitted specimen  and confirmed on repeat testing.  While 2019 novel coronavirus  (SARS-CoV-2) nucleic acids may be present in the submitted sample  additional confirmatory testing may be necessary for epidemiological  and / or clinical management purposes  to differentiate between  SARS-CoV-2 and other Sarbecovirus currently known to infect humans.  If clinically indicated additional testing with an alternate test  methodology [phone]) is advised. The SARS-CoV-2 RNA is generally  detectable in upper and lower respiratory sp ecimens during the acute  phase of infection. The expected result is Negative. Fact Sheet for Patients:  StrictlyIdeas.no Fact Sheet for Healthcare Providers: BankingDealers.co.za This test is not yet approved or cleared by the [place] FDA and has been authorized for detection and/or diagnosis of SARS-CoV-2 by FDA under an Emergency Use Authorization (EUA).  This EUA will remain in effect (meaning this test can be used) for the duration of the COVID-19 declaration under Section 564(b)(1) of the Act, 21 U.S.C. section 360bbb-3(b)(1), unless the authorization is terminated or revoked sooner. Performed at [facility], [address] [place], [place] [address]   MRSA PCR Screening     Status: None   Collection Time: 12/21/18 12:41 PM  Result Value Ref Range Status   MRSA by PCR NEGATIVE NEGATIVE Final    Comment:        The GeneXpert MRSA Assay (FDA approved for NASAL specimens only), is one component of a comprehensive MRSA colonization surveillance program. It is not intended to diagnose MRSA infection nor to guide or monitor  treatment for MRSA infections. Performed at [facility], [address], [place],  [address]      Time coordinating discharge: 45 minutes  SIGNED:   Tawni Millers, MD  [facility] 12/23/2018, 3:50 PM

## 2018-12-23 NOTE — Progress Notes (Signed)
Progress Note  Patient Name: Timothy [name] Date of Encounter: 12/23/2018  Primary Cardiologist: Satira Sark, MD ([place])  Subjective   No complaints converted to NSR   Inpatient Medications    Scheduled Meds:  amLODipine  10 mg Oral Daily   apixaban  5 mg Oral BID   aspirin EC  81 mg Oral Daily   carvedilol  25 mg Oral BID WC   Chlorhexidine Gluconate Cloth  6 each Topical U2725   folic acid  1 mg Oral Daily   furosemide  40 mg Oral Daily   isosorbide-hydrALAZINE  2 tablet Oral TID   multivitamin with minerals  1 tablet Oral Daily   pravastatin  10 mg Oral q1800   sodium chloride flush  3 mL Intravenous Q12H   thiamine  100 mg Oral Daily   Or   thiamine  100 mg Intravenous Daily   Continuous Infusions:  sodium chloride     PRN Meds: sodium chloride, acetaminophen **OR** acetaminophen, hydrALAZINE, LORazepam **OR** LORazepam, ondansetron **OR** ondansetron (ZOFRAN) IV, sodium chloride flush   Vital Signs    Vitals:   12/23/18 0700 12/23/18 0725 12/23/18 0800 12/23/18 0803  BP: (!) 162/116  (!) 164/95   Pulse: 71  72   Resp: (!) 27  (!) 31   Temp:  97.8 F (36.6 C)    TempSrc:  Oral    SpO2: 100%  100% 96%  Weight:      Height:        Intake/Output Summary (Last 24 hours) at 12/23/2018 0831 Last data filed at 12/23/2018 0100 Gross per 24 hour  Intake 84.38 ml  Output 1475 ml  Net -1390.62 ml   Filed Weights   12/21/18 1239 12/22/18 0600 12/23/18 0445  Weight: 92.9 kg 92.9 kg 93.4 kg    Telemetry    NSR rates 70's   ECG    Tracing from 12/21/2018 showed typical atrial flutter with 4:1 block.  Personally reviewed.  Physical Exam   BP (!) 164/95    Pulse 72    Temp 97.8 F (36.6 C) (Oral)    Resp (!) 31    Ht 5\' 6"  (1.676 m)    Wt 93.4 kg    SpO2 96%    BMI 33.23 kg/m  Affect appropriate Overweight black male  HEENT: normal Neck supple with no adenopathy JVP normal no bruits no thyromegaly Lungs clear with no wheezing  and good diaphragmatic motion Heart:  S1/S2 no murmur, no rub, gallop or click PMI normal Abdomen: benighn, BS positve, no tenderness, no AAA no bruit.  No HSM or HJR Distal pulses intact with no bruits No edema Neuro non-focal Skin warm and dry No muscular weakness   Labs    Chemistry Recent Labs  Lab 12/21/18 0854 12/22/18 0427  NA 140 140  K 3.7 4.0  CL 108 107  CO2 22 22  GLUCOSE 155* 128*  BUN 28* 26*  CREATININE 2.04* 2.03*  CALCIUM 8.9 8.9  PROT 7.1  --   ALBUMIN 4.2  --   AST 42*  --   ALT 56*  --   ALKPHOS 103  --   BILITOT 1.4*  --   GFRNONAA 33* 33*  GFRAA 38* 39*  ANIONGAP 10 11     Hematology Recent Labs  Lab 12/21/18 0854 12/22/18 0427 12/23/18 0549  WBC 6.5 6.4 5.8  RBC 6.28* 5.61 5.36  HGB 16.5 14.6 13.9  HCT 51.6 46.2 44.5  MCV 82.2 82.4  83.0  MCH 26.3 26.0 25.9*  MCHC 32.0 31.6 31.2  RDW 16.2* 14.6 14.6  PLT 222 195 190    Cardiac Enzymes Recent Labs  Lab 12/21/18 0854 12/21/18 1336 12/21/18 1936 12/22/18 0120  TROPONINI 0.08* 0.07* 0.07* 0.07*   No results for input(s): TROPIPOC in the last 168 hours.   BNP Recent Labs  Lab 12/21/18 0856  BNP 978.0*     Radiology    Ct Head Wo Contrast  Result Date: 12/21/2018 CLINICAL DATA:  Dizziness and shortness of breath since yesterday. EXAM: CT HEAD WITHOUT CONTRAST TECHNIQUE: Contiguous axial images were obtained from the base of the skull through the vertex without intravenous contrast. COMPARISON:  March 20, 2013 FINDINGS: Brain: No evidence of acute infarction, hemorrhage, hydrocephalus, extra-axial collection or mass lesion/mass effect. There is mild chronic diffuse atrophy. Bilateral periventricular white matter small vessel ischemic changes are identified. Vascular: No hyperdense vessel is noted. Skull: Normal. Negative for fracture or focal lesion. Sinuses/Orbits: Minimal mucoperiosteal thickening of bilateral ethmoid sinuses are noted. The orbits are normal. Other: None.  IMPRESSION: 1. No focal acute intracranial abnormality identified. 2. Chronic diffuse atrophy. Chronic bilateral periventricular white matter small vessel ischemic change. Electronically Signed   By: Abelardo Diesel M.D.   On: 12/21/2018 11:23   Dg Chest Portable 1 View  Result Date: 12/21/2018 CLINICAL DATA:  dizziness and sob since yesterday. Denies cough, fever, or any pain. Reports dizziness comes and goes. Pt describes dizziness as room spinning EXAM: PORTABLE CHEST - 1 VIEW COMPARISON:  none FINDINGS: Relatively low lung volumes. Can not exclude mild central pulmonary vascular congestion. No confluent airspace infiltrate or overt edema. Cardiomegaly. No effusion. No pneumothorax. Visualized bones unremarkable. IMPRESSION: Cardiomegaly.  No focal infiltrate. Electronically Signed   By: Lucrezia Europe M.D.   On: 12/21/2018 10:32    Cardiac Studies   Echocardiogram 12/21/2018:  1. The left ventricle has a visually estimated ejection fraction of approximately 20%. Chamber size is normal and there is moderate left ventricular hypertrophy. Left ventricular diastolic Doppler parameters are indeterminate in the setting of atrial  flutter.  2. There is akinesis of the left ventricular, apical inferior wall.  3. There is akinesis of the left ventricular, basal inferior wall.  4. There is akinesis of the left ventricular, mid-apical anteroseptal wall.  5. The right ventricle has normal systolic function. The cavity was normal. There is no increase in right ventricular wall thickness. Right ventricular systolic pressure is mildly elevated with an estimated pressure of 38.2 mmHg.  6. Left atrial size was moderately dilated.  7. Right atrial size was moderately dilated.  8. Small pericardial effusion.  9. The pericardial effusion is localized near the right atrium. 10. The aortic valve is tricuspid. Aortic valve regurgitation is trivial by color flow Doppler. Mild aortic annular calcification noted. 11. The  mitral valve is grossly normal. Mild thickening of the mitral valve leaflet. Mitral valve regurgitation is mild to moderate by color flow Doppler. 12. The tricuspid valve is grossly normal. There is mild tricuspid regurgitation. 13. The inferior vena cava was dilated in size with <50% respiratory variability.  Patient Profile     63 y.o. male hypertension, hyperlipidemia, CKD stage III, alcohol abuse, and noncompliance now presenting with newly documented atrial flutter and cardiomyopathy.  Assessment & Plan    1.  Newly documented typical atrial flutter of uncertain duration, CHADSVASC score of 3.  Converted to NSR on eliquis and coreg improved   2.  Uncontrolled hypertension, presented  with hypertensive emergency.  This is in the setting of noncompliance.improving with addition of bidil   3.  Newly documented cardiomyopathy with LVEF approximately 20%, moderate LVH, focal wall motion abnormalities also noted.  Possibly multifactorial due to uncontrolled hypertension, alcohol abuse, potential for tachycardia-mediated, and even underlying ischemic heart disease.  Troponin I levels are minimally increased and in flat pattern consistent with demand ischemia rather than ACS. Can have outpatient myovue inj a few weeks to screen for CAD   4.  Noncompliance with medications and follow-up.  5.  Alcohol abuse.  6.  Acute on chronic renal failure with CKD stage III at baseline.  Creatinine 2.0.  7.  Mixed hyperlipidemia, currently on Pravachol.  Probably ok to d/c home will arrange outpatient f/u with Dr Domenic Polite   Signed, Jenkins Rouge, MD  12/23/2018, 8:31 AM

## 2018-12-23 NOTE — Progress Notes (Signed)
Patient has taken his heart monitor off and agitated stating he is not waiting to be discharged. Pt starting to put clothes on. Dr. Cathlean Sauer paged and has been in to see pt. Pt agrees to wait for Dr. Cathlean Sauer to put in discharge orders.

## 2019-04-29 ENCOUNTER — Other Ambulatory Visit: Payer: Self-pay

## 2019-04-29 ENCOUNTER — Emergency Department (HOSPITAL_COMMUNITY): Payer: Medicare Other

## 2019-04-29 ENCOUNTER — Encounter (HOSPITAL_COMMUNITY): Payer: Self-pay | Admitting: Emergency Medicine

## 2019-04-29 ENCOUNTER — Emergency Department (HOSPITAL_COMMUNITY)
Admission: EM | Admit: 2019-04-29 | Discharge: 2019-04-29 | Disposition: A | Discharge status: Home / Self Care | Payer: Medicare Other | Attending: Emergency Medicine | Admitting: Emergency Medicine

## 2019-04-29 DIAGNOSIS — N183 Chronic kidney disease, stage 3 (moderate): Secondary | ICD-10-CM | POA: Insufficient documentation

## 2019-04-29 DIAGNOSIS — M79672 Pain in left foot: Secondary | ICD-10-CM | POA: Diagnosis present

## 2019-04-29 DIAGNOSIS — Z87891 Personal history of nicotine dependence: Secondary | ICD-10-CM | POA: Insufficient documentation

## 2019-04-29 DIAGNOSIS — I129 Hypertensive chronic kidney disease with stage 1 through stage 4 chronic kidney disease, or unspecified chronic kidney disease: Secondary | ICD-10-CM | POA: Insufficient documentation

## 2019-04-29 DIAGNOSIS — N289 Disorder of kidney and ureter, unspecified: Secondary | ICD-10-CM

## 2019-04-29 DIAGNOSIS — Z79899 Other long term (current) drug therapy: Secondary | ICD-10-CM | POA: Insufficient documentation

## 2019-04-29 DIAGNOSIS — R2242 Localized swelling, mass and lump, left lower limb: Secondary | ICD-10-CM | POA: Diagnosis not present

## 2019-04-29 DIAGNOSIS — M25476 Effusion, unspecified foot: Secondary | ICD-10-CM

## 2019-04-29 MED ORDER — NAPROXEN 500 MG PO TABS: 500.0000 mg | ORAL_TABLET | Freq: Two times a day (BID) | ORAL | 0 refills | Status: DC

## 2019-04-29 MED ORDER — PREDNISONE 10 MG (21) PO TBPK: ORAL_TABLET | Freq: Every day | ORAL | 0 refills | Status: DC

## 2019-04-29 MED ORDER — OXYCODONE HCL 5 MG PO TABS: 5.0000 mg | ORAL_TABLET | ORAL | 0 refills | Status: AC | PRN

## 2019-04-29 NOTE — ED Notes (Signed)
Patient transported to X-ray 

## 2019-04-29 NOTE — Discharge Instructions (Addendum)
You were seen in the ER for left great toe pain.  X-ray was normal.  Suspect symptoms are from a flare of gout.  Take prednisone as prescribed and until completed.  For more pain control you can take [phone] mg acetaminophen (tylenol) every 6-8 hours.  Elevate your foot.  For more pain control or break through pain take 5 mg oxycodone every 4-6 hours or just as needed. Oxycodone is a narcotic pain medication that has risk of overdose, death, dependence and abuse. Mild and expected side effects include nausea, stomach upset, drowsiness, constipation. Do not consume alcohol, drive or use heavy machinery while taking this medication. Do not leave unattended around children. Flush any remaining pills that you do not use and do not share.  The emergency department has a strict policy regarding prescription of narcotic medications. We prescribe a short course for acute, new pain or injuries. We are unable to refill this medication in the emergency department for chronic pain or repeatedly.  Refill need to be done by specialist or primary care provider or pain clinic.  Contact your primary care provider or specialist for chronic pain management and refill on narcotic medications.   Return for worsening pain redness swelling, redness or swelling up the leg or calf, fevers

## 2019-04-29 NOTE — ED Provider Notes (Signed)
[facility] Provider Note   CSN: [id number] Arrival date & time: 04/29/19  1522     History   Chief Complaint Chief Complaint  Patient presents with  . Foot Pain    HPI Timothy [name] is a 63 y.o. male with h/o ETOH abuse, renal insufficiency, HTN, HLD here for evaluation of left great toe pain. Sudden onset when he woke up this morning. Associated with swelling.  Wife couldn't put sock on due to pain. No trauma. No associated redness, warmth, calf pain or swelling. No h/o gout. No h/o IVUD. Tried ibuprofen without relief. Worse with palpation, and movement. Alleviated with rest.      HPI  Past Medical History:  Diagnosis Date  . Alcohol abuse   . CKD (chronic kidney disease) stage 3, GFR 30-59 ml/min (H[facility])   . Hyperlipidemia   . Hypertension   . Noncompliance     Patient Active Problem List   Diagnosis Date Noted  . AKI (acute kidney injury) ([name]) 12/22/2018  . Alcohol withdrawal syndrome with complication, with unspecified complication ([name]) [id number]  . Secondary cardiomyopathy ([name])   . Typical atrial flutter ([name])   . Hypertensive emergency   . Noncompliance   . Acute renal failure superimposed on stage 3 chronic kidney disease ([name])   . Alcohol abuse   . Hx of atrial flutter 12/21/2018  . Essential hypertension, benign 10/29/2015  . Hyperlipidemia 10/29/2015  . Obesity 10/29/2015  . CKD (chronic kidney disease) 10/29/2015    History reviewed. No pertinent surgical history.      Home Medications    Prior to Admission medications   Medication Sig Start Date End Date Taking? Authorizing Provider  amLODipine (NORVASC) 10 MG tablet Take 1 tablet (10 mg total) by mouth daily for 30 days. 12/24/18 01/23/19  Arrien, Jimmy Picket, MD  apixaban (ELIQUIS) 5 MG TABS tablet Take 1 tablet (5 mg total) by mouth 2 (two) times daily for 30 days. 12/23/18 01/22/19  Arrien, Jimmy Picket, MD  carvedilol (COREG) 25 MG tablet Take 1 tablet (25 mg  total) by mouth 2 (two) times daily with a meal for 30 days. 12/23/18 01/22/19  Arrien, Jimmy Picket, MD  furosemide (LASIX) 40 MG tablet Take 1 tablet (40 mg total) by mouth daily for 30 days. 12/24/18 01/23/19  Arrien, Jimmy Picket, MD  hydrALAZINE (APRESOLINE) 25 MG tablet Take 1 tablet (25 mg total) by mouth 3 (three) times daily for 30 days. 12/23/18 01/22/19  Arrien, Jimmy Picket, MD  isosorbide dinitrate (ISORDIL) 20 MG tablet Take 1 tablet (20 mg total) by mouth 3 (three) times daily for 30 days. 12/23/18 01/22/19  Arrien, Jimmy Picket, MD  oxyCODONE (OXY IR/ROXICODONE) 5 MG immediate release tablet Take 1 tablet (5 mg total) by mouth every 4 (four) hours as needed for up to 3 days for severe pain. 04/29/19 05/02/19  Kinnie Feil, PA-C  pravastatin (PRAVACHOL) 10 MG tablet Take 1 tablet (10 mg total) by mouth daily at 6 PM for 30 days. 12/23/18 01/22/19  Arrien, Jimmy Picket, MD  predniSONE (STERAPRED UNI-PAK 21 TAB) 10 MG (21) TBPK tablet Take by mouth daily. Take 6 tabs by mouth daily  for 2 days, then 5 tabs for 2 days, then 4 tabs for 2 days, then 3 tabs for 2 days, 2 tabs for 2 days, then 1 tab by mouth daily for 2 days 04/29/19   Kinnie Feil, PA-C    Family History Family History  Problem Relation Age of  Onset  . Diabetes Mother   . Hypertension Mother   . Cancer Father        Prostate Cancer  . Hypertension Father   . Diabetes Sister   . Diabetes Brother   . Hypertension Brother   . Hypertension Brother     Social History Social History   Tobacco Use  . Smoking status: Former Smoker    Types: Cigars  . Smokeless tobacco: Never Used  Substance Use Topics  . Alcohol use: Yes    Alcohol/week: 7.0 standard drinks    Types: 7 Cans of beer per week    Comment: daily- says quit last week  . Drug use: No     Allergies   Patient has no known allergies.   Review of Systems Review of Systems  Musculoskeletal: Positive for arthralgias, gait problem and  joint swelling.  All other systems reviewed and are negative.    Physical Exam Updated Vital Signs BP (!) 150/80   Pulse 62   Temp 98.3 F (36.8 C) (Oral)   Resp 15   Ht 5\' 5"  (1.651 m)   Wt 90.7 kg   SpO2 98%   BMI 33.28 kg/m   Physical Exam Constitutional:      Appearance: He is well-developed.  HENT:     Head: Normocephalic.     Nose: Nose normal.  Eyes:     General: Lids are normal.  Neck:     Musculoskeletal: Normal range of motion.  Cardiovascular:     Rate and Rhythm: Normal rate.     Comments: 1+ DP and PT pulses bilaterally. No calf edema or tenderness  Pulmonary:     Effort: Pulmonary effort is normal. No respiratory distress.  Musculoskeletal: Normal range of motion.        General: Swelling and tenderness present.     Comments: Focal tenderness at left great MTP with mild edema, warmth, erythema.  No fluctuance. Mild associated edema to the distal 1st metatarsal but no tenderness. Patient able to move left great toe with only mild pain. No asymmetric calf edema tenderness erythema.   Skin:    Comments: Thickened hyperpigmented toe nails bilaterally, thickened, scaly and moist appearing skin in between all web spaces of toes. Toes are well perfused, warm   Neurological:     Mental Status: He is alert.     Comments: Sensation to light touch intact in LLE and foot. Strength in left ankle/great toe intact against resistance   Psychiatric:        Behavior: Behavior normal.      ED Treatments / Results  Labs (all labs ordered are listed, but only abnormal results are displayed) Labs Reviewed - No data to display  EKG None  Radiology Dg Foot Complete Left  Result Date: 04/29/2019 CLINICAL DATA:  Swelling left foot.  No injury. EXAM: LEFT FOOT - COMPLETE 3+ VIEW COMPARISON:  None. FINDINGS: There is no evidence of fracture or dislocation. There is no evidence of arthropathy or other focal bone abnormality. Soft tissues are unremarkable. IMPRESSION:  Negative. Electronically Signed   By: Dorise Bullion III M.D   On: 04/29/2019 16:20    Procedures Procedures (including critical care time)  Medications Ordered in ED Medications - No data to display   Initial Impression / Assessment and Plan / ED Course  I have reviewed the triage vital signs and the nursing notes.  Pertinent labs & imaging results that were available during my care of the patient were reviewed  by me and considered in my medical decision making (see chart for details).  History and exam pretty classic for first gout flare.  He has h/o ETOH abuse and renal insufficiency.  No trauma. X-ray ordered at triage reviewed by me and w/o acute bony abnormalities. He has only mild pain with ROM of joint and no fever. No streaking of erythema upwards. I considered cellulitis, septic arthritis but unlikely. Will dc with prednisone and oxycodone as he has underlying renal insufficiency will avoid NSAID and colchicine. Low purine diet discussed. Return precautions given. Pt and wife comfortable with plan.   Final Clinical Impressions(s) / ED Diagnoses   Final diagnoses:  Swelling of first metatarsophalangeal (MTP) joint  Renal insufficiency    ED Discharge Orders         Ordered    predniSONE (STERAPRED UNI-PAK 21 TAB) 10 MG (21) TBPK tablet  Daily     04/29/19 1646    naproxen (NAPROSYN) 500 MG tablet  2 times daily with meals,   Status:  Discontinued     04/29/19 1646    oxyCODONE (OXY IR/ROXICODONE) 5 MG immediate release tablet  Every 4 hours PRN     04/29/19 1654           Kinnie Feil, PA-C 04/29/19 1839    Milton Ferguson, MD 05/03/19 [phone]

## 2019-04-29 NOTE — ED Triage Notes (Signed)
Patient c/o swelling to left foot with slight pain. Denies any known injury. Per patient woke this morning with the swelling. Denies any pain with walking. Pedal pulse strong.

## 2019-06-22 DIAGNOSIS — I509 Heart failure, unspecified: Secondary | ICD-10-CM | POA: Diagnosis not present

## 2019-06-22 DIAGNOSIS — I4892 Unspecified atrial flutter: Secondary | ICD-10-CM | POA: Diagnosis not present

## 2019-06-22 DIAGNOSIS — E785 Hyperlipidemia, unspecified: Secondary | ICD-10-CM | POA: Diagnosis not present

## 2019-06-22 DIAGNOSIS — Z9119 Patient's noncompliance with other medical treatment and regimen: Secondary | ICD-10-CM | POA: Diagnosis not present

## 2019-06-22 DIAGNOSIS — I10 Essential (primary) hypertension: Secondary | ICD-10-CM | POA: Diagnosis not present

## 2019-07-26 DIAGNOSIS — E785 Hyperlipidemia, unspecified: Secondary | ICD-10-CM | POA: Diagnosis not present

## 2019-07-26 DIAGNOSIS — I10 Essential (primary) hypertension: Secondary | ICD-10-CM | POA: Diagnosis not present

## 2019-07-26 DIAGNOSIS — M65351 Trigger finger, right little finger: Secondary | ICD-10-CM | POA: Diagnosis not present

## 2019-09-11 ENCOUNTER — Ambulatory Visit (INDEPENDENT_AMBULATORY_CARE_PROVIDER_SITE_OTHER): Payer: Medicare Other | Admitting: Orthopedic Surgery

## 2019-09-11 ENCOUNTER — Other Ambulatory Visit: Payer: Self-pay

## 2019-09-11 ENCOUNTER — Encounter: Payer: Self-pay | Admitting: Orthopedic Surgery

## 2019-09-11 VITALS — BP 205/95 | HR 61 | Ht 65.0 in | Wt 205.0 lb

## 2019-09-11 DIAGNOSIS — M720 Palmar fascial fibromatosis [Dupuytren]: Secondary | ICD-10-CM

## 2019-09-11 NOTE — Addendum Note (Signed)
Addended byCandice Camp on: 09/11/2019 11:01 AM   Modules accepted: Orders

## 2019-09-11 NOTE — Patient Instructions (Signed)
Dupuytren's Contracture  Dupuytren's contracture is a condition in which tissue under the skin of the palm becomes thick. This causes one or more of the fingers to curl inward (contract) toward the palm. After a while, the fingers may not be able to straighten out. This condition affects some or all of the fingers and the palm of the hand. This condition may affect one or both hands.  Dupuytren's contracture is a long-term (chronic) condition that develops (progresses) slowly over time. There is no cure, but symptoms can be managed and progression can be slowed with treatment. This condition is usually not dangerous or painful, but it can interfere with everyday tasks.  What are the causes?    This condition is caused by tissue (fascia) in the palm that gets thicker and tighter. When the fascia thickens, it pulls on the cords of tissue (tendons) that control finger movement. This causes the fingers to contract.  The cause of fascia thickening is not known. However, the condition is often passed along from parent to child (inherited).  What increases the risk?  The following factors may make you more likely to develop this condition:  Being 40 years of age or older.  Being male.  Having a family history of this condition.  Using tobacco products, including cigarettes, chewing tobacco, and e-cigarettes.  Drinking alcohol excessively.  Having diabetes.  Having a seizure disorder.  What are the signs or symptoms?    Early symptoms of this condition may include:  Thick, puckered skin on the hand.  One or more lumps (nodules) on the palm. Nodules may be tender when they first appear, but they are generally painless.  Later symptoms of this condition may include:  Thick cords of tissue in the palm.  Fingers curled up toward the palm.  Inability to straighten the fingers into their normal position.  Though this condition is usually painless, you may have discomfort when holding or grabbing objects.  How is this  diagnosed?  This condition is diagnosed with a physical exam, which may include:  Looking at your hands and feeling your palms. This is to check for thickened fascia and nodules.  Measuring finger motion.  Doing the [place] tabletop test. You may be asked to try to put your hand on a surface, with your palm down and your fingers straight out.  How is this treated?  There is no cure for this condition, but treatment can relieve discomfort and make symptoms more manageable. Treatment options may include:  Physical therapy. This can strengthen your hand and increase flexibility.  Occupational therapy. This can help you with everyday tasks that may be more difficult because of your condition.  Shots (injections). Substances may be injected into your hand, such as:  Medicines that help to decrease swelling (corticosteroids).  Proteins (collagenase) to weaken thick tissue. After a collagenase injection, your health care provider may stretch your fingers.  Needle aponeurotomy. A needle is pushed through the skin and into the fascia. Moving the needle against the fascia can weaken or break up the thick tissue.  Surgery. This may be needed if your condition causes discomfort or interferes with everyday activities. Physical therapy is usually needed after surgery.  No treatment is guaranteed to cure this condition. Recurrence of symptoms is common.  Follow these instructions at home:  Hand care  Take these actions to help protect your hand from possible injury:  Use tools that have padded grips.  Wear protective gloves while you work with your   provider.  Manage any other conditions that you have, such as diabetes.  If physical therapy was prescribed, do exercises as told by your health care provider.  Do not use any products  that contain nicotine or tobacco, such as cigarettes, e-cigarettes, and chewing tobacco. If you need help quitting, ask your health care provider.  If you drink alcohol: ? Limit how much you use to:  0-1 drink a day for women.  0-2 drinks a day for men. ? Be aware of how much alcohol is in your drink. In the [place] one drink equals one 12 oz bottle of beer (355 mL), one 5 oz glass of wine (148 mL), or one 1 oz glass of hard liquor (44 mL).  Keep all follow-up visits as told by your health care provider. This is important. Contact a health care provider if:  You develop new symptoms, or your symptoms get worse.  You have pain that gets worse or does not get better with medicine.  You have difficulty or discomfort with everyday tasks.  You develop numbness or tingling. Get help right away if:  You have severe pain.  Your fingers change color or become unusually cold. Summary  Dupuytren's contracture is a condition in which tissue under the skin of the palm becomes thick.  This condition is caused by tissue (fascia) that thickens. When it thickens, it pulls on the cords of tissue (tendons) that control finger movement and makes the fingers to contract.  You are more likely to develop this condition if you are a man, are over 60 years of age, have a family history of the condition, and drink a lot of alcohol.  This condition can be treated with physical and occupational therapy, injections, and surgery.  Follow instructions about how to care for your hand. Get help right away if you have severe pain or your fingers change color or become cold. This information is not intended to replace advice given to you by your health care provider. Make sure you discuss any questions you have with your health care provider. Document Revised: 02/08/2018 Document Reviewed: 02/08/2018 Elsevier Patient Education  [place]. Collagenase injection (Dupuytren's Contracture/Peyronie's  Disease) What is this medicine? COLLAGENASE (kohl LAH jen ace) is used to treat Dupuytren's contracture. This medicine may help straighten a bent finger by breaking up hard tissue. It is also used for Peyronie's disease by breaking up the hard tissue plaque that causes the curvature in the penis. This medicine may be used for other purposes; ask your health care provider or pharmacist if you have questions. COMMON BRAND NAME(S): Xiaflex What should I tell my health care provider before I take this medicine? They need to know if you have any of these conditions:  hemophilia  low platelet counts  take medicines that treat or prevent blood clots  an unusual or allergic reaction to collagenase, other medicines, foods, dyes, or preservatives  pregnant or trying to get pregnant  breast-feeding How should I use this medicine? This medicine is for injection into the hand or penis. It is given by a health care professional in a hospital or clinic setting. A special MedGuide will be given to you by the pharmacist with each prescription and refill. Be sure to read this information carefully each time. Talk to your pediatrician regarding the use of this medicine in children. Special care may be needed. Overdosage: If you think you have taken too much of this medicine contact a poison control center or emergency  room at once. NOTE: This medicine is only for you. Do not share this medicine with others. What if I miss a dose? It is important not to miss your dose. Call your doctor or health care professional if you are unable to keep an appointment. What may interact with this medicine?  aspirin and aspirin-like medicines  certain medicines that treat or prevent blood clots like warfarin, enoxaparin, and dalteparin This list may not describe all possible interactions. Give your health care provider a list of all the medicines, herbs, non-prescription drugs, or dietary supplements you use. Also tell  them if you smoke, drink alcohol, or use illegal drugs. Some items may interact with your medicine. What should I watch for while using this medicine? Your condition will be monitored carefully while you are receiving this medicine. If being treated for Dupuytren's contracture, return to your healthcare provider the day after your hand is injected. In the meantime, do not flex or extend the fingers of your hand that was injected. Do not touch your finger that was injected, and elevate your hand until bedtime. Do not perform activity with the injected hand until you are told that it is OK. Follow any instructions about wearing a splint or performing finger exercises. Also, call your healthcare provider if you get increasing redness or swelling in the hand, if you have numbness or tingling in the treated finger, or if you have trouble bending the finger after the swelling goes down. If being treated for Peyronie's disease, you will need to return to your healthcare provider for a manual procedure that will stretch and help straighten your penis. Also, your healthcare provider will show you how to gently stretch your penis at home. Do not resume sexual activity until you are told that it is okay. Follow instructions on when to return for follow-up visits. Immediately call your doctor if you have trouble stretching or straightening your penis, or if you have pain or other concerns. Immediately call your healthcare provider if you get a fever or chills. What side effects may I notice from receiving this medicine? Side effects that you should report to your doctor or health care professional as soon as possible:  allergic reactions like skin rash, itching or hives, swelling of the face, lips, or tongue  breathing problems  chest pain or palpitations  pain in your penis  pain when urinating  red or dark-brown urine  sudden loss of the ability to maintain an erection  swelling of the injected  hand  unusual swelling or bruising of the penis Side effects that usually do not require medical attention (report to your doctor or health care professional if they continue or are bothersome):  irritation at site where injected  pain at site where injected  unusual bleeding or bruising This list may not describe all possible side effects. Call your doctor for medical advice about side effects. You may report side effects to FDA at 1-800-FDA-1088. Where should I keep my medicine? This drug is given in a hospital or clinic and will not be stored at home. NOTE: This sheet is a summary. It may not cover all possible information. If you have questions about this medicine, talk to your doctor, pharmacist, or health care provider.  2020 Elsevier/Gold Standard (2015-08-22 09:34:13)

## 2019-09-11 NOTE — Progress Notes (Signed)
Timothy [name]  09/11/2019  Body mass index is 34.11 kg/m.   HISTORY SECTION :  Chief Complaint  Patient presents with  . Hand Problem    Right small finger problem   HPI The patient presents for evaluation of "trigger finger" but actually has a Dupuytren's contracture which he says has caused the finger to go into the palm but only for the last month he also has one on the other side but no obvious contracture.  Is not painful but does not allow full hand function  Review of Systems  All other systems reviewed and are negative.    has a past medical history of Alcohol abuse, CKD (chronic kidney disease) stage 3, GFR 30-59 ml/min, Hyperlipidemia, Hypertension, and Noncompliance.   History reviewed. No pertinent surgical history.  Body mass index is 34.11 kg/m.   No Known Allergies   Current Outpatient Medications:  .  apixaban (ELIQUIS) 5 MG TABS tablet, Take 1 tablet (5 mg total) by mouth 2 (two) times daily for 30 days., Disp: 60 tablet, Rfl: 0 .  carvedilol (COREG) 25 MG tablet, Take 1 tablet (25 mg total) by mouth 2 (two) times daily with a meal for 30 days., Disp: 60 tablet, Rfl: 0 .  furosemide (LASIX) 40 MG tablet, Take 1 tablet (40 mg total) by mouth daily for 30 days., Disp: 30 tablet, Rfl: 0 .  pravastatin (PRAVACHOL) 10 MG tablet, Take 1 tablet (10 mg total) by mouth daily at 6 PM for 30 days., Disp: 30 tablet, Rfl: 0 .  amLODipine (NORVASC) 10 MG tablet, Take 1 tablet (10 mg total) by mouth daily for 30 days. (Patient not taking: Reported on 09/11/2019), Disp: 30 tablet, Rfl: 0 .  hydrALAZINE (APRESOLINE) 25 MG tablet, Take 1 tablet (25 mg total) by mouth 3 (three) times daily for 30 days. (Patient not taking: Reported on 09/11/2019), Disp: 90 tablet, Rfl: 0 .  isosorbide dinitrate (ISORDIL) 20 MG tablet, Take 1 tablet (20 mg total) by mouth 3 (three) times daily for 30 days. (Patient not taking: Reported on 09/11/2019), Disp: 90 tablet, Rfl: 0   PHYSICAL EXAM  SECTION: 1) BP (!) 205/95   Pulse 61   Ht 5\' 5"  (1.651 m)   Wt 205 lb (93 kg)   BMI 34.11 kg/m   Body mass index is 34.11 kg/m. General appearance: Well-developed well-nourished no gross deformities  2) Cardiovascular normal pulse and perfusion , normal color   3) Neurologically deep tendon reflexes are equal and normal, no sensation loss or deficits no pathologic reflexes  4) Psychological: Awake alert and oriented x3 mood and affect normal  5) Skin no lacerations or ulcerations no nodularity no palpable masses, no erythema or nodularity  6) Musculoskeletal:   Right hand palpable cord DIP joint is flexed 90 degrees PIP joint is flexed slightly neurovascular exam intact  Left hand palpable cord small finger   MEDICAL DECISION MAKING  A.  Encounter Diagnosis  Name Primary?  . Dupuytren's contracture of right hand Yes    B. DATA ANALYSED:   Management recommend referral to hand specialist   No orders of the defined types were placed in this encounter.     Arther Abbott, MD  09/11/2019 10:57 AM

## 2019-10-30 DIAGNOSIS — E559 Vitamin D deficiency, unspecified: Secondary | ICD-10-CM | POA: Diagnosis not present

## 2019-10-30 DIAGNOSIS — I10 Essential (primary) hypertension: Secondary | ICD-10-CM | POA: Diagnosis not present

## 2019-10-30 DIAGNOSIS — E785 Hyperlipidemia, unspecified: Secondary | ICD-10-CM | POA: Diagnosis not present

## 2019-11-03 ENCOUNTER — Telehealth: Payer: Self-pay

## 2019-11-03 NOTE — Telephone Encounter (Signed)
Called pt, phone not in service.

## 2019-11-03 NOTE — Telephone Encounter (Signed)
Per [facility] Call[facility] Pt is not taking eliques or ASA. Pt states he cannot afford it.  Please call Pt per Suanne Marker [phone]   Thanks renee

## 2019-11-07 NOTE — Telephone Encounter (Signed)
Called pt's daughter as his number is not in service. She will have him return call later once she gets to their house.

## 2019-11-07 NOTE — Telephone Encounter (Signed)
Spoke with pt. He is currently taking his eliquis as well as an 81 mg aspirin daily. He was not taking his eliquis for a very short period due to cost, but with [facility] he can afford it.

## 2020-12-14 ENCOUNTER — Emergency Department (HOSPITAL_COMMUNITY): Payer: Medicare Other

## 2020-12-14 ENCOUNTER — Emergency Department (HOSPITAL_COMMUNITY)
Admission: EM | Admit: 2020-12-14 | Discharge: 2020-12-14 | Disposition: A | Discharge status: Home / Self Care | Payer: Medicare Other | Attending: Emergency Medicine | Admitting: Emergency Medicine

## 2020-12-14 ENCOUNTER — Encounter (HOSPITAL_COMMUNITY): Payer: Self-pay | Admitting: *Deleted

## 2020-12-14 ENCOUNTER — Other Ambulatory Visit: Payer: Self-pay

## 2020-12-14 DIAGNOSIS — Z79899 Other long term (current) drug therapy: Secondary | ICD-10-CM | POA: Diagnosis not present

## 2020-12-14 DIAGNOSIS — B349 Viral infection, unspecified: Secondary | ICD-10-CM | POA: Insufficient documentation

## 2020-12-14 DIAGNOSIS — Z20822 Contact with and (suspected) exposure to covid-19: Secondary | ICD-10-CM | POA: Diagnosis not present

## 2020-12-14 DIAGNOSIS — N183 Chronic kidney disease, stage 3 unspecified: Secondary | ICD-10-CM | POA: Insufficient documentation

## 2020-12-14 DIAGNOSIS — Z7901 Long term (current) use of anticoagulants: Secondary | ICD-10-CM | POA: Diagnosis not present

## 2020-12-14 DIAGNOSIS — I129 Hypertensive chronic kidney disease with stage 1 through stage 4 chronic kidney disease, or unspecified chronic kidney disease: Secondary | ICD-10-CM | POA: Insufficient documentation

## 2020-12-14 DIAGNOSIS — R110 Nausea: Secondary | ICD-10-CM | POA: Diagnosis present

## 2020-12-14 DIAGNOSIS — R5383 Other fatigue: Secondary | ICD-10-CM

## 2020-12-14 LAB — RESP PANEL BY RT-PCR (FLU A&B, COVID) ARPGX2
Influenza A by PCR: NEGATIVE
Influenza B by PCR: NEGATIVE
SARS Coronavirus 2 by RT PCR: NEGATIVE

## 2020-12-14 LAB — COMPREHENSIVE METABOLIC PANEL
ALT: 13 U/L (ref 0–44)
AST: 17 U/L (ref 15–41)
Albumin: 3.9 g/dL (ref 3.5–5.0)
Alkaline Phosphatase: 62 U/L (ref 38–126)
Anion gap: 8 (ref 5–15)
BUN: 19 mg/dL (ref 8–23)
CO2: 25 mmol/L (ref 22–32)
Calcium: 9.3 mg/dL (ref 8.9–10.3)
Chloride: 104 mmol/L (ref 98–111)
Creatinine, Ser: 1.54 mg/dL — ABNORMAL HIGH (ref 0.61–1.24)
GFR, Estimated: 49 mL/min — ABNORMAL LOW (ref >60)
Glucose, Bld: 189 mg/dL — ABNORMAL HIGH (ref 70–99)
Potassium: 4.1 mmol/L (ref 3.5–5.1)
Sodium: 137 mmol/L (ref 135–145)
Total Bilirubin: 1.8 mg/dL — ABNORMAL HIGH (ref 0.3–1.2)
Total Protein: 7.1 g/dL (ref 6.5–8.1)

## 2020-12-14 LAB — CBC WITH DIFFERENTIAL/PLATELET
Abs Immature Granulocytes: 0.04 10*3/uL (ref 0.00–0.07)
Basophils Absolute: 0.1 10*3/uL (ref 0.0–0.1)
Basophils Relative: 0 %
Eosinophils Absolute: 0 10*3/uL (ref 0.0–0.5)
Eosinophils Relative: 0 %
HCT: 45.7 % (ref 39.0–52.0)
Hemoglobin: 15.2 g/dL (ref 13.0–17.0)
Immature Granulocytes: 0 %
Lymphocytes Relative: 5 %
Lymphs Abs: 0.6 10*3/uL — ABNORMAL LOW (ref 0.7–4.0)
MCH: 27.4 pg (ref 26.0–34.0)
MCHC: 33.3 g/dL (ref 30.0–36.0)
MCV: 82.3 fL (ref 80.0–100.0)
Monocytes Absolute: 0.6 10*3/uL (ref 0.1–1.0)
Monocytes Relative: 5 %
Neutro Abs: 10.2 10*3/uL — ABNORMAL HIGH (ref 1.7–7.7)
Neutrophils Relative %: 90 %
Platelets: 169 10*3/uL (ref 150–400)
RBC: 5.55 MIL/uL (ref 4.22–5.81)
RDW: 15.2 % (ref 11.5–15.5)
WBC: 11.5 10*3/uL — ABNORMAL HIGH (ref 4.0–10.5)
nRBC: 0 % (ref 0.0–0.2)

## 2020-12-14 LAB — TROPONIN I (HIGH SENSITIVITY)
Troponin I (High Sensitivity): 23 ng/L — ABNORMAL HIGH (ref <18)
Troponin I (High Sensitivity): 21 ng/L — ABNORMAL HIGH (ref <18)

## 2020-12-14 LAB — ETHANOL: Alcohol, Ethyl (B): <10 mg/dL (ref <10)

## 2020-12-14 MED ORDER — SODIUM CHLORIDE 0.9 % IV SOLN: INTRAVENOUS | Status: DC

## 2020-12-14 MED ORDER — ONDANSETRON HCL 4 MG/2ML IJ SOLN
4.0000 mg | Freq: Once | INTRAMUSCULAR | Status: AC
  Administered 2020-12-14: 4 mg via INTRAVENOUS
  Filled 2020-12-14: qty 2

## 2020-12-14 MED ORDER — ONDANSETRON 4 MG PO TBDP: 4.0000 mg | ORAL_TABLET | Freq: Three times a day (TID) | ORAL | 1 refills | Status: DC | PRN

## 2020-12-14 NOTE — ED Notes (Signed)
Pt aware of need for urine specimen. Family remains at bedside with pt. Updated him on POC and they express understanding. Stretcher low and locked, call bell in reach.

## 2020-12-14 NOTE — ED Triage Notes (Signed)
States I don't feel right/

## 2020-12-14 NOTE — ED Notes (Signed)
Pt and family member expressed verbal understanding of DC paperwork. Electronic signature pad not available.

## 2020-12-14 NOTE — ED Triage Notes (Signed)
C/o being tired onset this morning

## 2020-12-14 NOTE — ED Notes (Signed)
Urine specimen not required per MD.

## 2020-12-14 NOTE — ED Notes (Addendum)
Pt reports medication has decreased his nausea.

## 2020-12-14 NOTE — ED Provider Notes (Signed)
[facility] EMERGENCY DEPARTMENT Provider Note   CSN: [id number] Arrival date & time: 12/14/20  1654     History Chief Complaint  Patient presents with  . Fatigue    Timothy [name] is a 65 y.o. male.  Patient with complaint of feeling weak.  Some nausea.  Has not had any vomiting or diarrhea.  Patient's had the basic COVID immunizations but no booster.  Temp upon arrival was 100.1.  Patient denies headache chest pain shortness of breath abdominal pain diarrhea difficulty urinating or pain with urination.  No rash.        Past Medical History:  Diagnosis Date  . Alcohol abuse   . CKD (chronic kidney disease) stage 3, GFR 30-59 ml/min (HCC)   . Hyperlipidemia   . Hypertension   . Noncompliance     Patient Active Problem List   Diagnosis Date Noted  . AKI (acute kidney injury) ([place]) 12/22/2018  . Alcohol withdrawal syndrome with complication, with unspecified complication ([place]) 31/54/0086  . Secondary cardiomyopathy ([place])   . Typical atrial flutter ([place])   . Hypertensive emergency   . Noncompliance   . Acute renal failure superimposed on stage 3 chronic kidney disease ([place])   . Alcohol abuse   . Hx of atrial flutter 12/21/2018  . Essential hypertension, benign 10/29/2015  . Hyperlipidemia 10/29/2015  . Obesity 10/29/2015  . CKD (chronic kidney disease) 10/29/2015    History reviewed. No pertinent surgical history.     Family History  Problem Relation Age of Onset  . Diabetes Mother   . Hypertension Mother   . Cancer Father        Prostate Cancer  . Hypertension Father   . Diabetes Sister   . Diabetes Brother   . Hypertension Brother   . Hypertension Brother     Social History   Tobacco Use  . Smoking status: Former Smoker    Types: Cigars  . Smokeless tobacco: Never Used  Vaping Use  . Vaping Use: Never used  Substance Use Topics  . Alcohol use: Yes    Alcohol/week: 7.0 standard drinks    Types: 7 Cans of beer per week    Comment: daily-  says quit last week  . Drug use: No    Home Medications Prior to Admission medications   Medication Sig Start Date End Date Taking? Authorizing Provider  ondansetron (ZOFRAN ODT) 4 MG disintegrating tablet Take 1 tablet (4 mg total) by mouth every 8 (eight) hours as needed. 12/14/20  Yes Fredia Sorrow, MD  amLODipine (NORVASC) 10 MG tablet Take 1 tablet (10 mg total) by mouth daily for 30 days. Patient not taking: Reported on 09/11/2019 12/24/18 01/23/19  Arrien, Jimmy Picket, MD  apixaban (ELIQUIS) 5 MG TABS tablet Take 1 tablet (5 mg total) by mouth 2 (two) times daily for 30 days. 12/23/18 09/11/19  Arrien, Jimmy Picket, MD  carvedilol (COREG) 25 MG tablet Take 1 tablet (25 mg total) by mouth 2 (two) times daily with a meal for 30 days. 12/23/18 09/11/19  Arrien, Jimmy Picket, MD  furosemide (LASIX) 40 MG tablet Take 1 tablet (40 mg total) by mouth daily for 30 days. 12/24/18 09/11/19  Arrien, Jimmy Picket, MD  hydrALAZINE (APRESOLINE) 25 MG tablet Take 1 tablet (25 mg total) by mouth 3 (three) times daily for 30 days. Patient not taking: Reported on 09/11/2019 12/23/18 09/11/19  Arrien, Jimmy Picket, MD  isosorbide dinitrate (ISORDIL) 20 MG tablet Take 1 tablet (20 mg total) by mouth 3 (  three) times daily for 30 days. Patient not taking: Reported on 09/11/2019 12/23/18 01/22/19  Arrien, Jimmy Picket, MD  pravastatin (PRAVACHOL) 10 MG tablet Take 1 tablet (10 mg total) by mouth daily at 6 PM for 30 days. 12/23/18 09/11/19  Arrien, Jimmy Picket, MD    Allergies    Patient has no known allergies.  Review of Systems   Review of Systems  Constitutional: Positive for fatigue and fever. Negative for chills.  HENT: Negative for congestion, rhinorrhea and sore throat.   Eyes: Negative for visual disturbance.  Respiratory: Negative for cough and shortness of breath.   Cardiovascular: Negative for chest pain and leg swelling.  Gastrointestinal: Positive for nausea. Negative for abdominal pain,  diarrhea and vomiting.  Genitourinary: Negative for dysuria.  Musculoskeletal: Negative for back pain and neck pain.  Skin: Negative for rash.  Neurological: Negative for dizziness, light-headedness and headaches.  Hematological: Does not bruise/bleed easily.  Psychiatric/Behavioral: Negative for confusion.    Physical Exam Updated Vital Signs BP (!) 165/77   Pulse (!) 58   Temp 100.1 F (37.8 C) (Oral)   Resp (!) 24   Ht 1.524 m (5')   Wt 86.2 kg   SpO2 97%   BMI 37.11 kg/m   Physical Exam Vitals and nursing note reviewed.  Constitutional:      General: He is not in acute distress.    Appearance: Normal appearance. He is well-developed. He is not ill-appearing.  HENT:     Head: Normocephalic and atraumatic.  Eyes:     Conjunctiva/sclera: Conjunctivae normal.     Pupils: Pupils are equal, round, and reactive to light.  Cardiovascular:     Rate and Rhythm: Normal rate and regular rhythm.     Heart sounds: No murmur heard.   Pulmonary:     Effort: Pulmonary effort is normal. No respiratory distress.     Breath sounds: Normal breath sounds.  Abdominal:     General: There is no distension.     Palpations: Abdomen is soft.     Tenderness: There is no abdominal tenderness. There is no guarding.  Musculoskeletal:        General: No swelling. Normal range of motion.     Cervical back: Normal range of motion and neck supple. No rigidity.  Skin:    General: Skin is warm and dry.     Capillary Refill: Capillary refill takes less than 2 seconds.  Neurological:     General: No focal deficit present.     Mental Status: He is alert and oriented to person, place, and time.     Cranial Nerves: No cranial nerve deficit.     Sensory: No sensory deficit.     ED Results / Procedures / Treatments   Labs (all labs ordered are listed, but only abnormal results are displayed) Labs Reviewed  COMPREHENSIVE METABOLIC PANEL - Abnormal; Notable for the following components:       Result Value   Glucose, Bld 189 (*)    Creatinine, Ser 1.54 (*)    Total Bilirubin 1.8 (*)    GFR, Estimated 49 (*)    All other components within normal limits  CBC WITH DIFFERENTIAL/PLATELET - Abnormal; Notable for the following components:   WBC 11.5 (*)    Neutro Abs 10.2 (*)    Lymphs Abs 0.6 (*)    All other components within normal limits  TROPONIN I (HIGH SENSITIVITY) - Abnormal; Notable for the following components:   Troponin I (High Sensitivity) 23 (*)  All other components within normal limits  TROPONIN I (HIGH SENSITIVITY) - Abnormal; Notable for the following components:   Troponin I (High Sensitivity) 21 (*)    All other components within normal limits  RESP PANEL BY RT-PCR (FLU A&B, COVID) ARPGX2  ETHANOL  URINALYSIS, ROUTINE W REFLEX MICROSCOPIC    EKG None  Radiology DG Chest Port 1 View  Result Date: 12/14/2020 CLINICAL DATA:  Fever, hypertension, dizziness EXAM: PORTABLE CHEST 1 VIEW COMPARISON:  12/21/2018 FINDINGS: Cardiomegaly. Both lungs are clear. The visualized skeletal structures are unremarkable. IMPRESSION: Cardiomegaly without acute abnormality of the lungs in AP portable projection. Electronically Signed   By: Eddie Candle M.D.   On: 12/14/2020 18:47    Procedures Procedures   Medications Ordered in ED Medications  0.9 %  sodium chloride infusion ( Intravenous New Bag/Given 12/14/20 1921)  ondansetron (ZOFRAN) injection 4 mg (4 mg Intravenous Given 12/14/20 2018)    ED Course  I have reviewed the triage vital signs and the nursing notes.  Pertinent labs & imaging results that were available during my care of the patient were reviewed by me and considered in my medical decision making (see chart for details).    MDM Rules/Calculators/A&P                          Patient did go on to vomit here.  Was treated with Zofran and feels much better.  No acute findings on exam or labs.  Urinalysis has not been been obtained yet.  Patient does not  want to wait for that.  States he is feeling better.  Have not completely ruled out urinary tract infection.  Patient will return for any new or worse symptoms.  We will go ahead and treat with Zofran.  Patient's COVID testing and influenza testing is negative.  Initial troponin was elevated but delta troponin was 21.  Initial troponin was 23.  So so downward trend.  No concerns for acute cardiac event.  EKG without any acute events. Final Clinical Impression(s) / ED Diagnoses Final diagnoses:  Viral illness  Fatigue, unspecified type    Rx / DC Orders ED Discharge Orders         Ordered    ondansetron (ZOFRAN ODT) 4 MG disintegrating tablet  Every 8 hours PRN        12/14/20 2153           Fredia Sorrow, MD 12/14/20 2157

## 2020-12-14 NOTE — ED Notes (Signed)
X-ray at bedside

## 2020-12-14 NOTE — ED Notes (Signed)
Pt states "I'm ready to go, when am I going home?" Explained MD will be notified to see if he can update the pt.

## 2020-12-14 NOTE — Discharge Instructions (Addendum)
Lab work-up here today without any acute findings.  Chest x-ray normal.  COVID testing and influenza testing negative.  Take the Zofran as needed for nausea vomiting.  Return for any new or worse symptoms.

## 2021-05-18 IMAGING — CT CT HEAD WITHOUT CONTRAST
3 series · 16 of 47 positions shown, 19 images · non-contrast
Comparison: March 20, 2013

CLINICAL DATA: Dizziness and shortness of breath since yesterday.

EXAM:
CT HEAD WITHOUT CONTRAST
TECHNIQUE: Contiguous axial images were obtained from the base of the skull
through the vertex without intravenous contrast.

[Series 2: head w o · axial · 0.43mm/px · z∈[+47,+177]mm · 10 of 32 slices shown, 13 images]
[im 3/32  brain]
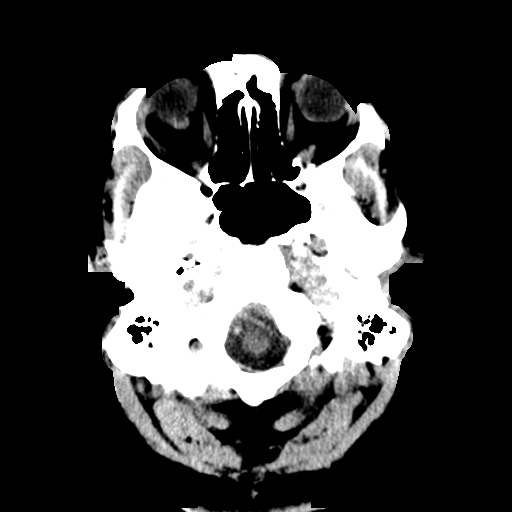
[im 3/32  bone]
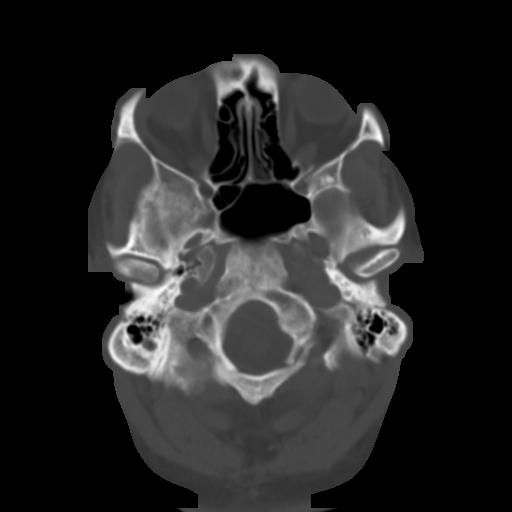
[im 6/32  brain]
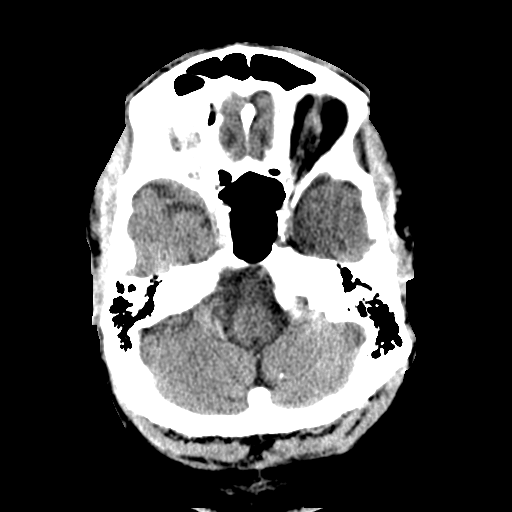
[im 9/32  brain]
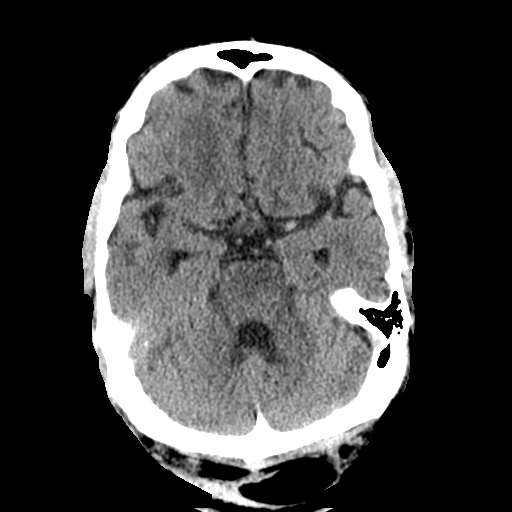
[im 11/32  brain]
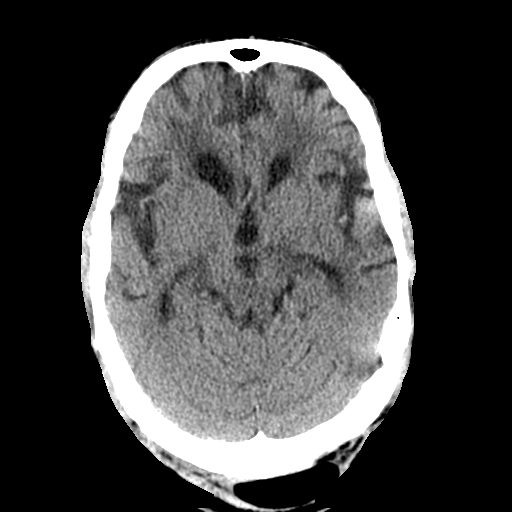
[im 14/32  brain]
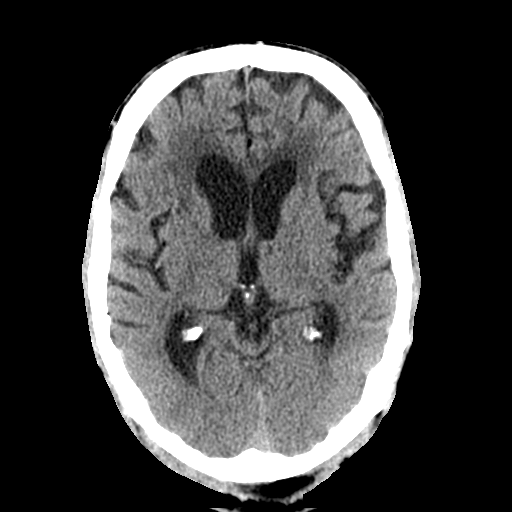
[im 14/32  bone]
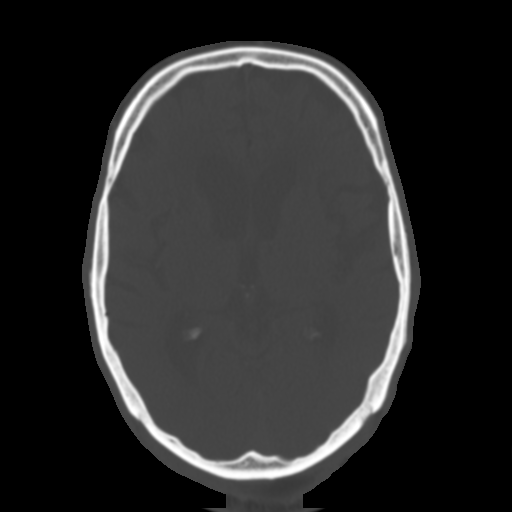
[im 18/32  brain]
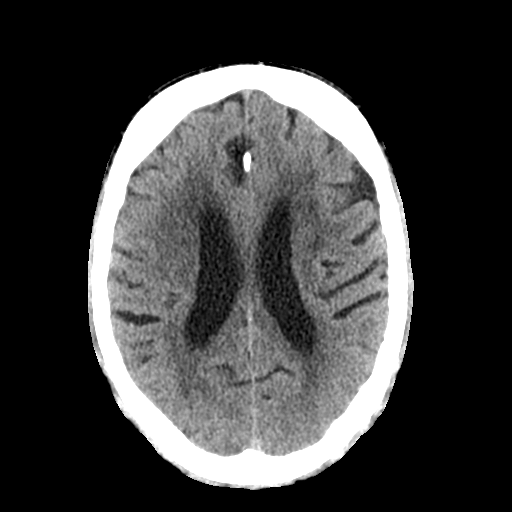
[im 21/32  brain]
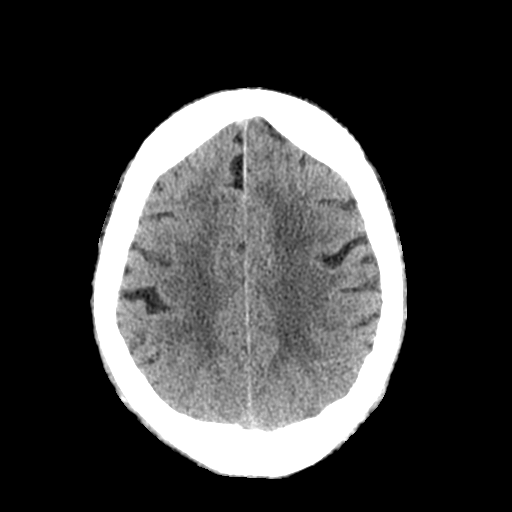
[im 24/32  brain]
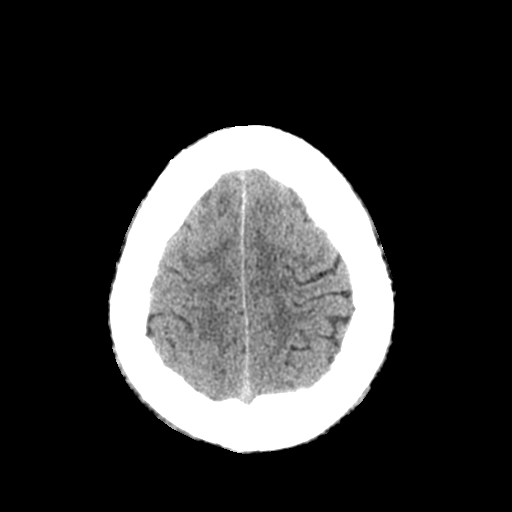
[im 26/32  brain]
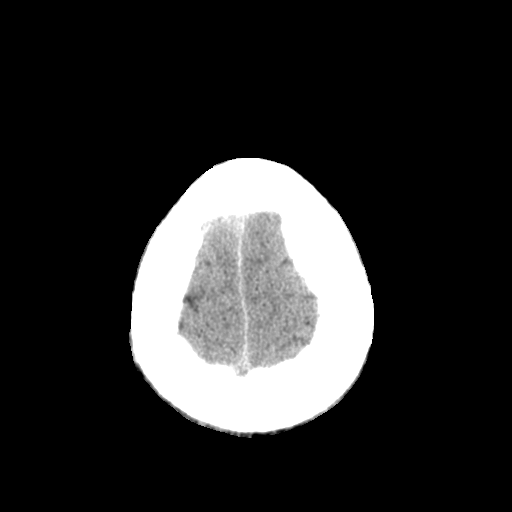
[im 26/32  bone]
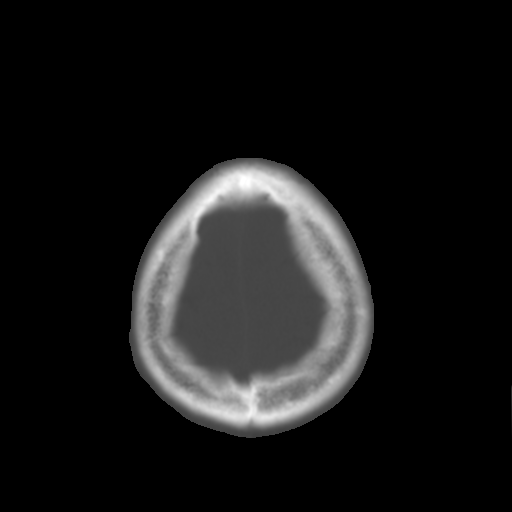
[im 29/32  brain]
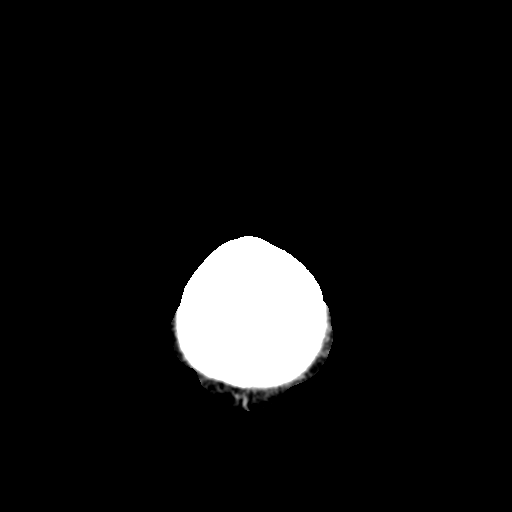

[Series 4: coronal soft · coronal · 0.38mm/px · 3 of 72 slices shown]
[im 24/72  brain]
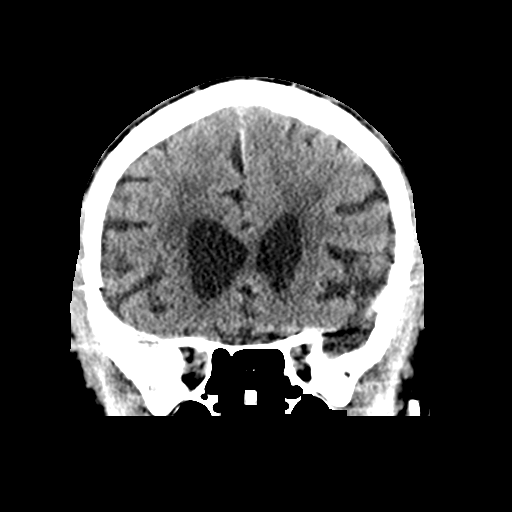
[im 32/72  brain]
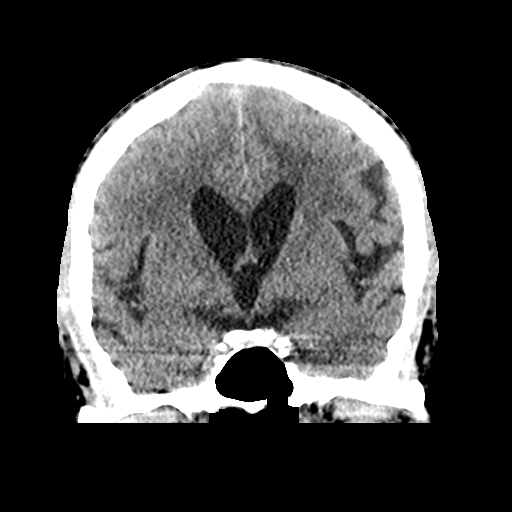
[im 40/72  brain]
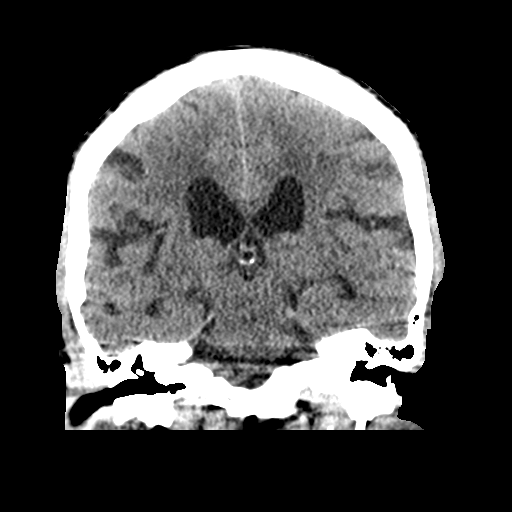

[Series 5: sagittal soft · sagittal · 0.35mm/px · 3 of 60 slices shown]
[im 20/60  brain]
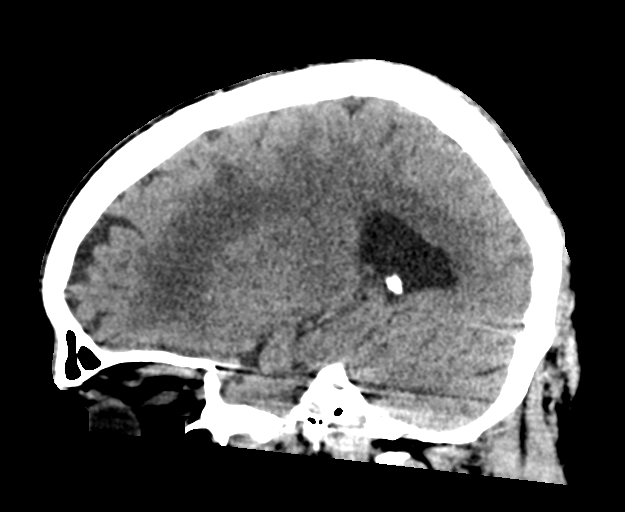
[im 30/60  brain]
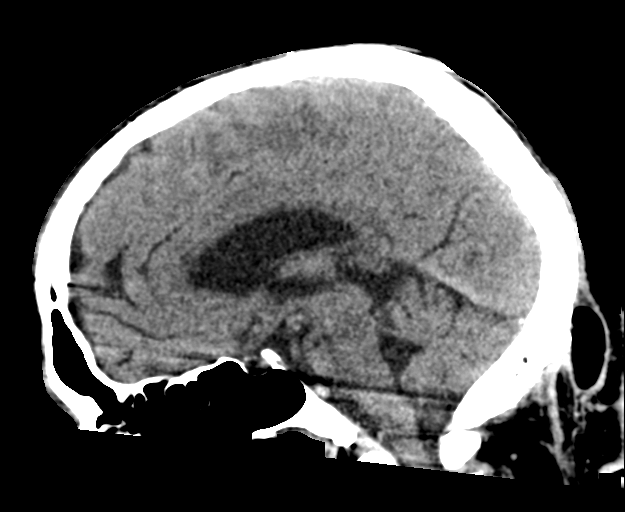
[im 40/60  brain]
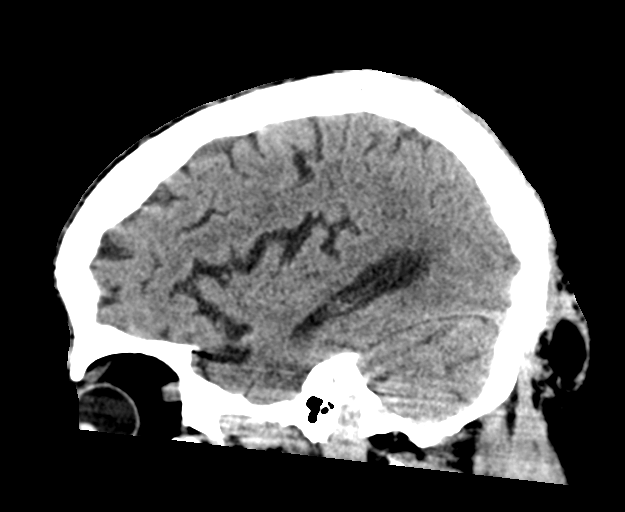

[16 of 47 positions shown; findings below may reference images not displayed]

FINDINGS: Brain: No evidence of acute infarction, hemorrhage, hydrocephalus,
extra-axial collection or mass lesion/mass effect. There is mild
chronic diffuse atrophy. Bilateral periventricular white matter
small vessel ischemic changes are identified.

Vascular: No hyperdense vessel is noted.

Skull: Normal. Negative for fracture or focal lesion.

Sinuses/Orbits: Minimal mucoperiosteal thickening of bilateral
ethmoid sinuses are noted. The orbits are normal.

Other: None.
IMPRESSION: 1. No focal acute intracranial abnormality identified.
2. Chronic diffuse atrophy. Chronic bilateral periventricular white
matter small vessel ischemic change.

## 2021-07-03 NOTE — Progress Notes (Signed)
Cardiology Office Note:   Date:  07/05/2021  NAME:  Timothy [name]    MRN: [id number] DOB:  October 23, 1955   PCP:  Yves Dill, NP  Cardiologist:  None  Electrophysiologist:  None   Referring MD: Beverly Milch, NP   Chief Complaint  Patient presents with   Congestive Heart Failure   History of Present Illness:   Timothy [name] is a 65 y.o. male with a hx of AFL, CHF, HTN, etoh abuse who is being seen today for the evaluation of CHF at the request of Nsumanganyi, Ferdinand Lango, NP.  He was diagnosed with systolic heart failure in May 2020.  EF was 20%.  This was in the setting of atrial flutter.  He converted back to sinus rhythm on Coreg.  He was discharged on heart failure medications with plans for follow-up.  He apparently never did follow-up.  He is also drinking alcohol quite a bit 2 years ago.  He apparently has cut back.  He denies any chest pain or trouble breathing.  He presents with his wife.  He informs me he can walk as far as he likes without limitations.  His diet apparently is very high in salty meals.  His blood pressure today is 164/82.  Current regimen includes amlodipine 10 mg daily, Coreg 25 twice a day, Isordil 20 mg 3 times daily.  He is no longer requiring Lasix.  He apparently is no longer taking Eliquis due to cost issues.  We discussed going back on this.  He has never had a heart attack or stroke.  Laboratory data from May of this year demonstrates CKD stage III.  He smokes a cigar once in a while.  He reports he may drink once a month.  No drug use is reported.  He is a retired Administrator.  His primary care physician referred him to [place] due to uncontrolled blood pressure.  Suspect salt is a big issue here.  No strong family history of heart disease.  Without symptoms today in office.  Problem List Atrial flutter -Dx 12/2018 -Spontaneous conversion to sinus rhythm 2. Systolic HF -EF 63% 02/8587 3. HTN 4. Etoh abuse  5. CKD III 6. HLD   Past  Medical History: Past Medical History:  Diagnosis Date   Alcohol abuse    CHF (congestive heart failure) (HCC)    CKD (chronic kidney disease) stage 3, GFR 30-59 ml/min (HCC)    Hyperlipidemia    Hypertension    Noncompliance     Past Surgical History: History reviewed. No pertinent surgical history.  Current Medications: Current Meds  Medication Sig   amLODipine (NORVASC) 10 MG tablet Take 1 tablet (10 mg total) by mouth daily for 30 days.   hydrALAZINE (APRESOLINE) 50 MG tablet Take 1 tablet (50 mg total) by mouth 3 (three) times daily.   isosorbide mononitrate (IMDUR) 60 MG 24 hr tablet Take 1 tablet (60 mg total) by mouth daily.   ondansetron (ZOFRAN ODT) 4 MG disintegrating tablet Take 1 tablet (4 mg total) by mouth every 8 (eight) hours as needed.   pravastatin (PRAVACHOL) 10 MG tablet Take 1 tablet (10 mg total) by mouth daily at 6 PM for 30 days.   [DISCONTINUED] apixaban (ELIQUIS) 5 MG TABS tablet Take 1 tablet (5 mg total) by mouth 2 (two) times daily for 30 days.   [DISCONTINUED] hydrALAZINE (APRESOLINE) 25 MG tablet Take 1 tablet (25 mg total) by mouth 3 (three) times daily for 30 days.   [  DISCONTINUED] isosorbide dinitrate (ISORDIL) 20 MG tablet Take 1 tablet (20 mg total) by mouth 3 (three) times daily for 30 days.     Allergies:    Patient has no known allergies.   Social History: Social History   Socioeconomic History   Marital status: Married    Spouse name: Not on file   Number of children: 3   Years of education: Not on file   Highest education level: Not on file  Occupational History   Occupation: Retired Administrator  Tobacco Use   Smoking status: Former    Types: Cigars   Smokeless tobacco: Never  Scientific laboratory technician Use: Never used  Substance and Sexual Activity   Alcohol use: Yes    Alcohol/week: 7.0 standard drinks    Types: 7 Cans of beer per week    Comment: daily- says quit last week   Drug use: No   Sexual activity: Not on file  Other  Topics Concern   Not on file  Social History Narrative   Not on file   Social Determinants of Health   Financial Resource Strain: Not on file  Food Insecurity: Not on file  Transportation Needs: Not on file  Physical Activity: Not on file  Stress: Not on file  Social Connections: Not on file     Family History: The patient's family history includes Cancer in his father; Diabetes in his brother, mother, and sister; Hypertension in his brother, brother, father, and mother.  ROS:   All other ROS reviewed and negative. Pertinent positives noted in the HPI.     EKGs/Labs/Other Studies Reviewed:   The following studies were personally reviewed by me today:  EKG:  EKG is ordered today.  The ekg ordered today demonstrates sinus bradycardia heart rate 57, first-degree AV block, LVH by voltage, nonspecific ST-T changes, and was personally reviewed by me.   TTE 12/21/2018  1. The left ventricle has a visually estimated ejection fraction of  approximately 20%. Chamber size is normal and there is moderate left  ventricular hypertrophy. Left ventricular diastolic Doppler parameters are  indeterminate in the setting of atrial  flutter.   2. There is akinesis of the left ventricular, apical inferior wall.   3. There is akinesis of the left ventricular, basal inferior wall.   4. There is akinesis of the left ventricular, mid-apical anteroseptal  wall.   5. The right ventricle has normal systolic function. The cavity was  normal. There is no increase in right ventricular wall thickness. Right  ventricular systolic pressure is mildly elevated with an estimated  pressure of 38.2 mmHg.   6. Left atrial size was moderately dilated.   7. Right atrial size was moderately dilated.   8. Small pericardial effusion.   9. The pericardial effusion is localized near the right atrium.  10. The aortic valve is tricuspid. Aortic valve regurgitation is trivial  by color flow Doppler. Mild aortic annular  calcification noted.  11. The mitral valve is grossly normal. Mild thickening of the mitral  valve leaflet. Mitral valve regurgitation is mild to moderate by color  flow Doppler.  12. The tricuspid valve is grossly normal. There is mild tricuspid  regurgitation.  13. The inferior vena cava was dilated in size with <50% respiratory  variability.   Recent Labs: 12/14/2020: ALT 13; BUN 19; Creatinine, Ser 1.54; Hemoglobin 15.2; Platelets 169; Potassium 4.1; Sodium 137   Recent Lipid Panel    Component Value Date/Time   CHOL 204 (  H) 02/16/2017 0918   TRIG 106 02/16/2017 0918   HDL 60 02/16/2017 0918   CHOLHDL 3.4 02/16/2017 0918   VLDL 21 02/16/2017 0918   LDLCALC 123 (H) 02/16/2017 0918    Physical Exam:   VS:  BP (!) 164/82   Pulse (!) 57   Ht 5' 7"  (1.702 m)   Wt 178 lb 12.8 oz (81.1 kg)   SpO2 98%   BMI 28.00 kg/m    Wt Readings from Last 3 Encounters:  07/04/21 178 lb 12.8 oz (81.1 kg)  12/14/20 190 lb (86.2 kg)  09/11/19 205 lb (93 kg)    General: Well nourished, well developed, in no acute distress Head: Atraumatic, normal size  Eyes: PEERLA, EOMI  Neck: Supple, no JVD Endocrine: No thryomegaly Cardiac: Normal S1, S2; RRR; no murmurs, rubs, or gallops Lungs: Clear to auscultation bilaterally, no wheezing, rhonchi or rales  Abd: Soft, nontender, no hepatomegaly  Ext: No edema, pulses 2+ Musculoskeletal: No deformities, BUE and BLE strength normal and equal Skin: Warm and dry, no rashes   Neuro: Alert and oriented to person, place, time, and situation, CNII-XII grossly intact, no focal deficits  Psych: Normal mood and affect   ASSESSMENT:   Timothy [name] is a 65 y.o. male who presents for the following: 1. Typical atrial flutter ([place])   2. Chronic systolic heart failure ([place])   3. Primary hypertension   4. Mixed hyperlipidemia     PLAN:   1. Typical atrial flutter (HCC) -CHADSVASC=3 (age, HTN, CHF).  Diagnosed with atrial flutter in 2020.  This was in  the setting of congestive heart failure.  He apparently has stopped taking Eliquis.  I would recommend he continue Eliquis 5 mg twice daily based on his stroke risk. -EKG shows he is in sinus rhythm.  Would recommend to continue Coreg.  No suspected recurrence of a flutter.  2. Chronic systolic heart failure ([place]) -Diagnosed with systolic heart failure in the setting of atrial flutter and alcohol abuse in 2020.  EF at that time was 20%.  He has had no further evaluation by cardiology. -His cardiomyopathy was also contributed by alcohol abuse.  He apparently is refraining from alcohol at this time. -No symptoms of congestive heart failure.  Main issue is hypertension.  I would like to repeat an echocardiogram to see if his EF has recovered. -For blood pressure would continue Coreg 25 mg twice daily.  Due to CKD stage III with EGFR 49 would recommend to add hydralazine 25 mg 3 times daily and Imdur 60 mg daily.  We will stop his Isordil.  Would also recommend to continue amlodipine 10 mg daily.  Further titration of medical therapy based on the results of his echocardiogram. -He is euvolemic.  We will hold on Lasix and SGLT2 inhibitor for now.  3. Primary hypertension -Blood pressure remains elevated.  Main issue is salt consumption.  Would continue Coreg 25 mg twice daily, amlodipine 10 mg daily.  Transition to hydralazine 25 mg 3 times daily and Imdur 60 mg daily.  He was advised to adhere to a strict salt limited diet.  We will give him information on this. -Recommend to continue exercise. -He appears to be avoiding alcohol consumption which is good. -We will have him check his blood pressure once a day.  He will keep a log for [place].  We will see him back in 3 months.  4. Mixed hyperlipidemia -Continue home statin.   Disposition: Return in about 3 months (  around 10/02/2021).  Medication Adjustments/Labs and Tests Ordered: Current medicines are reviewed at length with the patient today.  Concerns  regarding medicines are outlined above.  Orders Placed This Encounter  Procedures   ECHOCARDIOGRAM COMPLETE    Meds ordered this encounter  Medications   isosorbide mononitrate (IMDUR) 60 MG 24 hr tablet    Sig: Take 1 tablet (60 mg total) by mouth daily.    Dispense:  90 tablet    Refill:  3   hydrALAZINE (APRESOLINE) 50 MG tablet    Sig: Take 1 tablet (50 mg total) by mouth 3 (three) times daily.    Dispense:  270 tablet    Refill:  3   apixaban (ELIQUIS) 5 MG TABS tablet    Sig: Take 1 tablet (5 mg total) by mouth 2 (two) times daily.    Dispense:  60 tablet    Refill:  6     Patient Instructions  Medication Instructions:   INCREASE Hydralazine to 50 mg three times a day  Take Eliquis 5 mg twice a day  Take Imdur 60 mg daily  STOP Isosorbide   Labwork: None   Testing/Procedures: Your physician has requested that you have an echocardiogram. Echocardiography is a painless test that uses sound waves to create images of your heart. It provides your doctor with information about the size and shape of your heart and how well your heart's chambers and valves are working. This procedure takes approximately one hour. There are no restrictions for this procedure.   Follow-Up: 3 months   Any Other Special Instructions Will Be Listed Below (If Applicable).     Follow 2 gram sodium guide given to you    Take blood pressure daily and track  If you need a refill on your cardiac medications before your next appointment, please call your pharmacy.    Signed, Addison Naegeli. Audie Box, MD, [facility]  [address] [place], [place] [address] [phone]  07/05/2021 9:26 AM

## 2021-07-04 ENCOUNTER — Ambulatory Visit: Payer: Medicare Other | Admitting: Cardiovascular Disease

## 2021-07-04 ENCOUNTER — Encounter: Payer: Self-pay | Admitting: Cardiovascular Disease

## 2021-07-04 VITALS — BP 164/82 | HR 57 | Ht 67.0 in | Wt 178.8 lb

## 2021-07-04 DIAGNOSIS — I10 Essential (primary) hypertension: Secondary | ICD-10-CM

## 2021-07-04 DIAGNOSIS — I5022 Chronic systolic (congestive) heart failure: Secondary | ICD-10-CM

## 2021-07-04 DIAGNOSIS — I483 Typical atrial flutter: Secondary | ICD-10-CM | POA: Diagnosis not present

## 2021-07-04 DIAGNOSIS — E782 Mixed hyperlipidemia: Secondary | ICD-10-CM

## 2021-07-04 MED ORDER — APIXABAN 5 MG PO TABS: 5.0000 mg | ORAL_TABLET | Freq: Two times a day (BID) | ORAL | 6 refills | Status: DC

## 2021-07-04 MED ORDER — ISOSORBIDE MONONITRATE ER 60 MG PO TB24: 60.0000 mg | ORAL_TABLET | Freq: Every day | ORAL | 3 refills | Status: DC

## 2021-07-04 MED ORDER — HYDRALAZINE HCL 50 MG PO TABS: 50.0000 mg | ORAL_TABLET | Freq: Three times a day (TID) | ORAL | 3 refills | Status: DC

## 2021-07-04 NOTE — Patient Instructions (Signed)
Medication Instructions:   INCREASE Hydralazine to 50 mg three times a day  Take Eliquis 5 mg twice a day  Take Imdur 60 mg daily  STOP Isosorbide   Labwork: None   Testing/Procedures: Your physician has requested that you have an echocardiogram. Echocardiography is a painless test that uses sound waves to create images of your heart. It provides your doctor with information about the size and shape of your heart and how well your heart's chambers and valves are working. This procedure takes approximately one hour. There are no restrictions for this procedure.   Follow-Up: 3 months   Any Other Special Instructions Will Be Listed Below (If Applicable).     Follow 2 gram sodium guide given to you    Take blood pressure daily and track  If you need a refill on your cardiac medications before your next appointment, please call your pharmacy.

## 2021-07-07 NOTE — Addendum Note (Signed)
Addended by: Briant Cedar on: 07/07/2021 11:01 AM   Modules accepted: Orders

## 2021-08-13 ENCOUNTER — Ambulatory Visit (HOSPITAL_COMMUNITY): Admission: RE | Admit: 2021-08-13 | Payer: Medicare Other | Source: Ambulatory Visit

## 2021-09-24 IMAGING — DX DG FOOT COMPLETE 3+V*L*
3 series · 3 of 3 positions shown · non-contrast
Comparison: None.

CLINICAL DATA: Swelling left foot.  No injury.

EXAM:
LEFT FOOT - COMPLETE 3+ VIEW

[foot ap]
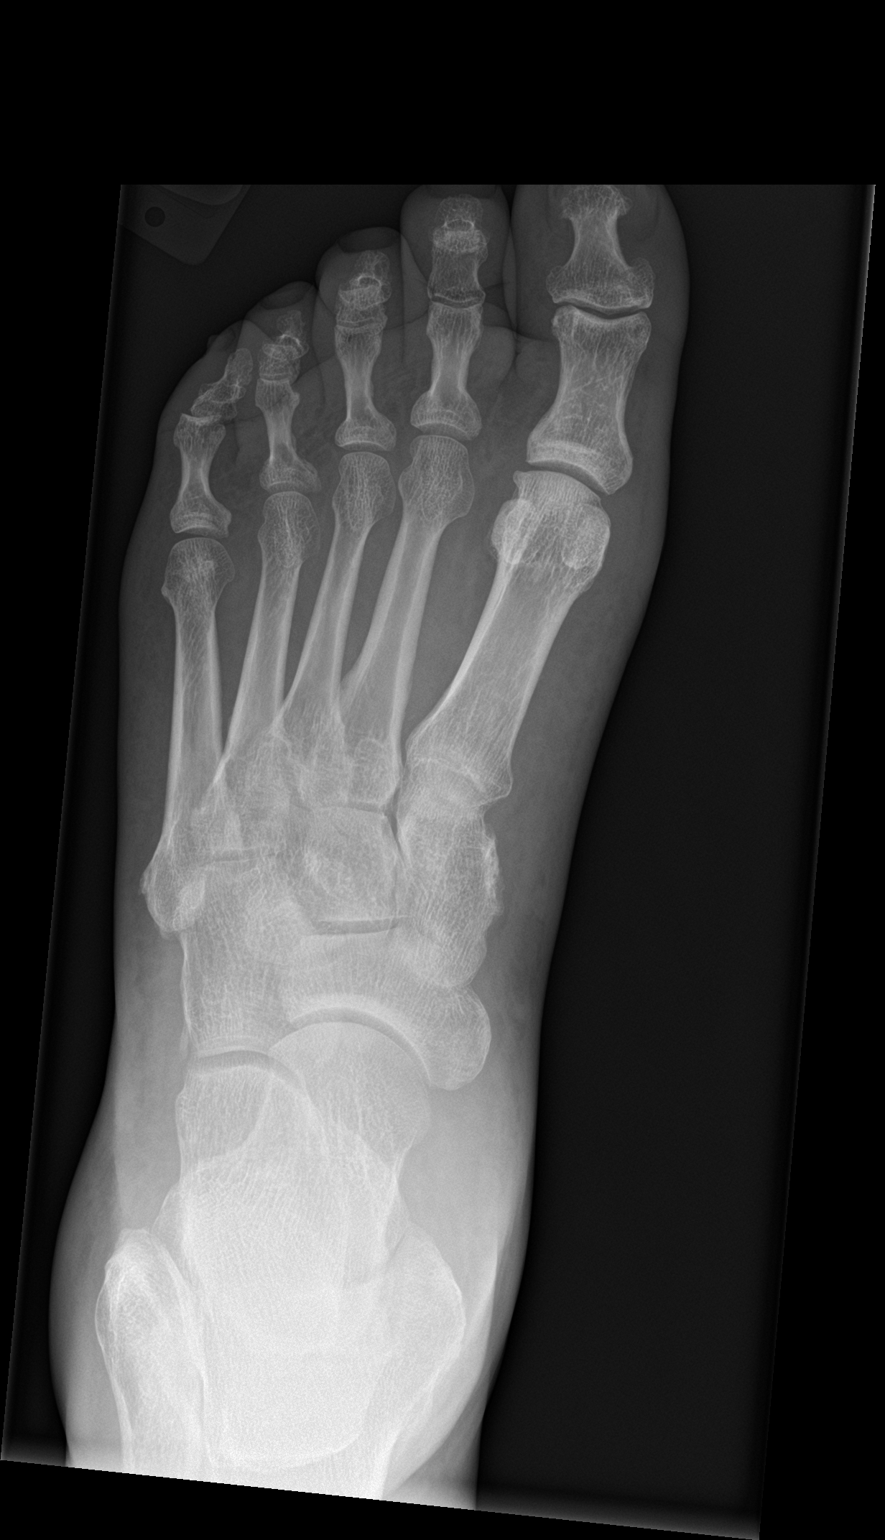

[foot obl]
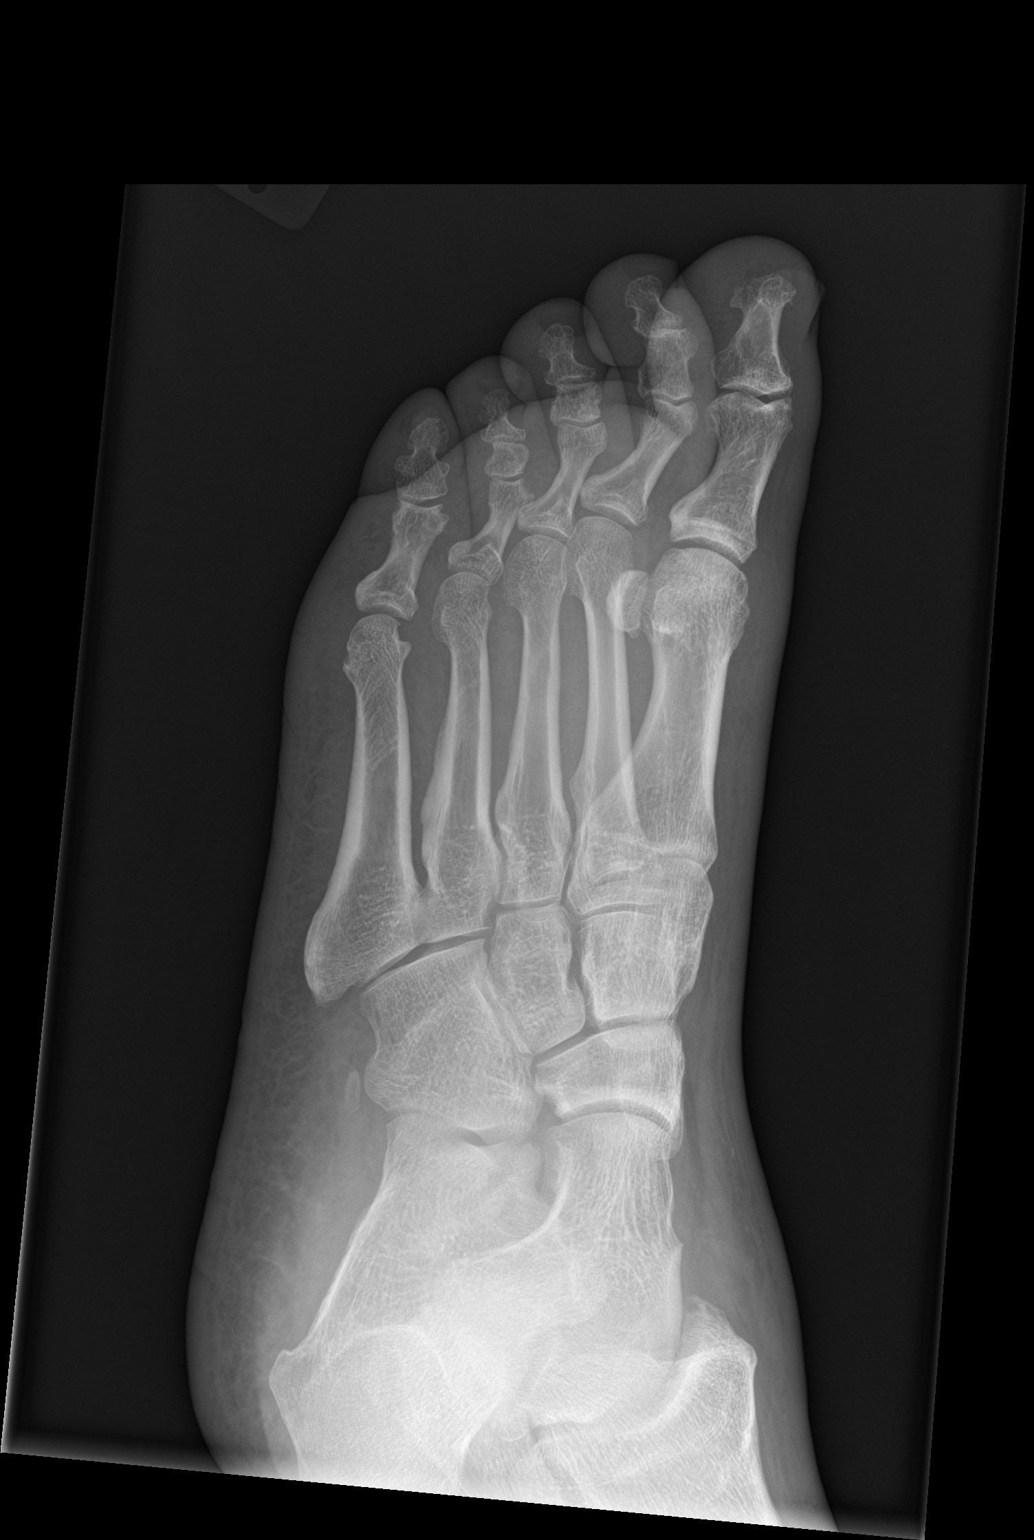

[foot lat]
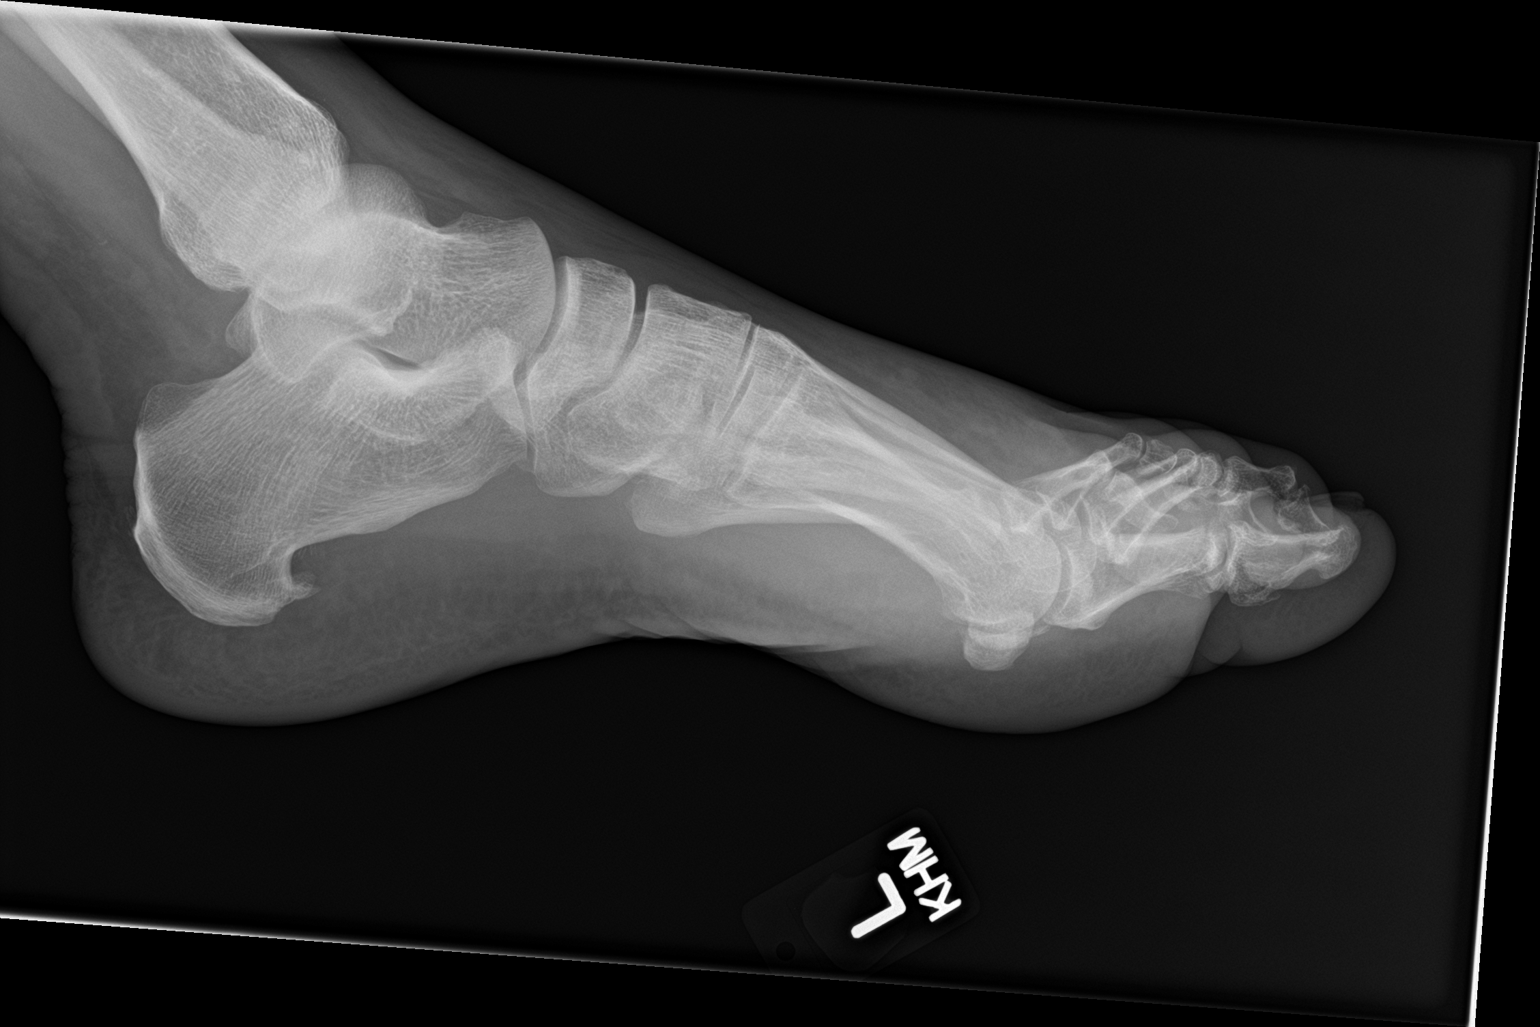

[3 of 3 positions shown; findings below may reference images not displayed]

FINDINGS: There is no evidence of fracture or dislocation. There is no
evidence of arthropathy or other focal bone abnormality. Soft
tissues are unremarkable.
IMPRESSION: Negative.

## 2021-10-13 ENCOUNTER — Ambulatory Visit (HOSPITAL_COMMUNITY): Admission: RE | Admit: 2021-10-13 | Payer: Medicare Other | Source: Ambulatory Visit

## 2021-10-28 NOTE — Progress Notes (Deleted)
?Cardiology Office Note:   ?Date:  10/28/2021  ?NAME:  Timothy [name]    ?MRN: [id number] ?DOB:  Oct 09, 1955  ? ?PCP:  Timothy Dill, NP  ?Cardiologist:  None  ?Electrophysiologist:  None  ? ?Referring MD: Timothy [name]*  ? ?No chief complaint on file. ?*** ? ?History of Present Illness:   ?Timothy [name] is a 66 y.o. male with a hx of atrial flutter, systolic HF, etoh abuse who presents for follow-up. Was not on medications in December. Repeat echo ordered and meds restarted.  ? ?Problem List ?Atrial flutter ?-Dx 12/2018 ?-Spontaneous conversion to sinus rhythm ?2. Systolic HF ?-EF 50% 04/3266 ?3. HTN ?4. Etoh abuse  ?5. CKD III ?6. HLD ? ?Past Medical History: ?Past Medical History:  ?Diagnosis Date  ? Alcohol abuse   ? CHF (congestive heart failure) ([place])   ? CKD (chronic kidney disease) stage 3, GFR 30-59 ml/min (HCC)   ? Hyperlipidemia   ? Hypertension   ? Noncompliance   ? ? ?Past Surgical History: ?No past surgical history on file. ? ?Current Medications: ?No outpatient medications have been marked as taking for the 10/29/21 encounter (Appointment) with O'Neal, Cassie Freer, MD.  ?  ? ?Allergies:    ?Patient has no known allergies.  ? ?Social History: ?Social History  ? ?Socioeconomic History  ? Marital status: Married  ?  Spouse name: Not on file  ? Number of children: 3  ? Years of education: Not on file  ? Highest education level: Not on file  ?Occupational History  ? Occupation: Retired Administrator  ?Tobacco Use  ? Smoking status: Former  ?  Types: Cigars  ? Smokeless tobacco: Never  ?Vaping Use  ? Vaping Use: Never used  ?Substance and Sexual Activity  ? Alcohol use: Yes  ?  Alcohol/week: 7.0 standard drinks  ?  Types: 7 Cans of beer per week  ?  Comment: daily- says quit last week  ? Drug use: No  ? Sexual activity: Not on file  ?Other Topics Concern  ? Not on file  ?Social History Narrative  ? Not on file  ? ?Social Determinants of Health  ? ?Financial Resource Strain: Not on  file  ?Food Insecurity: Not on file  ?Transportation Needs: Not on file  ?Physical Activity: Not on file  ?Stress: Not on file  ?Social Connections: Not on file  ?  ? ?Family History: ?The patient's ***family history includes Cancer in his father; Diabetes in his brother, mother, and sister; Hypertension in his brother, brother, father, and mother. ? ?ROS:   ?All other ROS reviewed and negative. Pertinent positives noted in the HPI.    ? ?EKGs/Labs/Other Studies Reviewed:   ?The following studies were personally reviewed by me today: ? ?EKG:  EKG is *** ordered today.  The ekg ordered today demonstrates ***, and was personally reviewed by me.  ? ?TTE 12/21/2018 ? 1. The left ventricle has a visually estimated ejection fraction of  ?approximately 20%. Chamber size is normal and there is moderate left  ?ventricular hypertrophy. Left ventricular diastolic Doppler parameters are  ?indeterminate in the setting of atrial  ?flutter.  ? 2. There is akinesis of the left ventricular, apical inferior wall.  ? 3. There is akinesis of the left ventricular, basal inferior wall.  ? 4. There is akinesis of the left ventricular, mid-apical anteroseptal  ?wall.  ? 5. The right ventricle has normal systolic function. The cavity was  ?normal. There is no increase in  right ventricular wall thickness. Right  ?ventricular systolic pressure is mildly elevated with an estimated  ?pressure of 38.2 mmHg.  ? 6. Left atrial size was moderately dilated.  ? 7. Right atrial size was moderately dilated.  ? 8. Small pericardial effusion.  ? 9. The pericardial effusion is localized near the right atrium.  ?10. The aortic valve is tricuspid. Aortic valve regurgitation is trivial  ?by color flow Doppler. Mild aortic annular calcification noted.  ?11. The mitral valve is grossly normal. Mild thickening of the mitral  ?valve leaflet. Mitral valve regurgitation is mild to moderate by color  ?flow Doppler.  ?12. The tricuspid valve is grossly normal. There  is mild tricuspid  ?regurgitation.  ?13. The inferior vena cava was dilated in size with <50% respiratory  ?variability.  ? ?Recent Labs: ?12/14/2020: ALT 13; BUN 19; Creatinine, Ser 1.54; Hemoglobin 15.2; Platelets 169; Potassium 4.1; Sodium 137  ? ?Recent Lipid Panel ?   ?Component Value Date/Time  ? CHOL 204 (H) 02/16/2017 9485  ? TRIG 106 02/16/2017 0918  ? HDL 60 02/16/2017 0918  ? CHOLHDL 3.4 02/16/2017 0918  ? VLDL 21 02/16/2017 0918  ? [place] 123 (H) 02/16/2017 4627  ? ? ?Physical Exam:   ?VS:  There were no vitals taken for this visit.   ?Wt Readings from Last 3 Encounters:  ?07/04/21 178 lb 12.8 oz (81.1 kg)  ?12/14/20 190 lb (86.2 kg)  ?09/11/19 205 lb (93 kg)  ?  ?General: Well nourished, well developed, in no acute distress ?Head: Atraumatic, normal size  ?Eyes: PEERLA, EOMI  ?Neck: Supple, no JVD ?Endocrine: No thryomegaly ?Cardiac: Normal S1, S2; RRR; no murmurs, rubs, or gallops ?Lungs: Clear to auscultation bilaterally, no wheezing, rhonchi or rales  ?Abd: Soft, nontender, no hepatomegaly  ?Ext: No edema, pulses 2+ ?Musculoskeletal: No deformities, BUE and BLE strength normal and equal ?Skin: Warm and dry, no rashes   ?Neuro: Alert and oriented to person, place, time, and situation, CNII-XII grossly intact, no focal deficits  ?Psych: Normal mood and affect  ? ?ASSESSMENT:   ?Timothy [name] is a 66 y.o. male who presents for the following: ?No diagnosis found. ? ?PLAN:   ?There are no diagnoses linked to this encounter. ? ?{Are you ordering a CV Procedure (e.g. stress test, cath, DCCV, TEE, etc)?   Press F2        :[id number]} ? ?Disposition: No follow-ups on file. ? ?Medication Adjustments/Labs and Tests Ordered: ?Current medicines are reviewed at length with the patient today.  Concerns regarding medicines are outlined above.  ?No orders of the defined types were placed in this encounter. ? ?No orders of the defined types were placed in this encounter. ? ? ?There are no Patient Instructions on  file for this visit.  ? ?Time Spent with Patient: I have spent a total of *** minutes with patient reviewing hospital notes, telemetry, EKGs, labs and examining the patient as well as establishing an assessment and plan that was discussed with the patient.  > 50% of time was spent in direct patient care. ? ?Signed, ?[place] T. Audie Box, MD, [facility] ?[address]  ?[address], [address] ?[place], [place] [address] ?[phone]  ?10/28/2021 9:35 AM    ? ?

## 2021-10-29 ENCOUNTER — Ambulatory Visit: Payer: Medicare Other | Admitting: Cardiovascular Disease

## 2021-11-13 ENCOUNTER — Ambulatory Visit (HOSPITAL_COMMUNITY): Admission: RE | Admit: 2021-11-13 | Payer: Medicare Other | Source: Ambulatory Visit

## 2021-11-24 ENCOUNTER — Ambulatory Visit (HOSPITAL_COMMUNITY)
Admission: RE | Admit: 2021-11-24 | Discharge: 2021-11-24 | Disposition: A | Discharge status: Home / Self Care | Payer: Medicare Other | Source: Ambulatory Visit | Attending: Cardiovascular Disease | Admitting: Cardiovascular Disease

## 2021-11-24 DIAGNOSIS — I5022 Chronic systolic (congestive) heart failure: Secondary | ICD-10-CM | POA: Diagnosis present

## 2021-11-24 LAB — ECHOCARDIOGRAM COMPLETE
Area-P 1/2: 5.38 cm2
S' Lateral: 3.3 cm

## 2021-11-24 NOTE — Progress Notes (Signed)
*  PRELIMINARY RESULTS* ?Echocardiogram ?2D Echocardiogram has been performed. ? ?Timothy [name] ?11/24/2021, 1:28 PM ?

## 2021-11-27 ENCOUNTER — Encounter: Payer: Self-pay | Admitting: *Deleted

## 2022-01-05 ENCOUNTER — Ambulatory Visit (INDEPENDENT_AMBULATORY_CARE_PROVIDER_SITE_OTHER): Payer: Medicare Other | Admitting: Cardiology

## 2022-01-05 ENCOUNTER — Other Ambulatory Visit (HOSPITAL_COMMUNITY)
Admission: RE | Admit: 2022-01-05 | Discharge: 2022-01-05 | Disposition: A | Discharge status: Home / Self Care | Payer: Medicare Other | Source: Ambulatory Visit | Attending: Cardiology | Admitting: Cardiology

## 2022-01-05 ENCOUNTER — Encounter: Payer: Self-pay | Admitting: Cardiology

## 2022-01-05 DIAGNOSIS — N1831 Chronic kidney disease, stage 3a: Secondary | ICD-10-CM

## 2022-01-05 DIAGNOSIS — E7800 Pure hypercholesterolemia, unspecified: Secondary | ICD-10-CM

## 2022-01-05 DIAGNOSIS — I483 Typical atrial flutter: Secondary | ICD-10-CM

## 2022-01-05 DIAGNOSIS — N179 Acute kidney failure, unspecified: Secondary | ICD-10-CM | POA: Diagnosis not present

## 2022-01-05 DIAGNOSIS — I5022 Chronic systolic (congestive) heart failure: Secondary | ICD-10-CM | POA: Diagnosis present

## 2022-01-05 LAB — COMPREHENSIVE METABOLIC PANEL
ALT: 12 U/L (ref 0–44)
AST: 17 U/L (ref 15–41)
Albumin: 3.8 g/dL (ref 3.5–5.0)
Alkaline Phosphatase: 56 U/L (ref 38–126)
Anion gap: 5 (ref 5–15)
BUN: 23 mg/dL (ref 8–23)
CO2: 24 mmol/L (ref 22–32)
Calcium: 8.9 mg/dL (ref 8.9–10.3)
Chloride: 109 mmol/L (ref 98–111)
Creatinine, Ser: 1.65 mg/dL — ABNORMAL HIGH (ref 0.61–1.24)
GFR, Estimated: 45 mL/min — ABNORMAL LOW (ref >60)
Glucose, Bld: 135 mg/dL — ABNORMAL HIGH (ref 70–99)
Potassium: 3.7 mmol/L (ref 3.5–5.1)
Sodium: 138 mmol/L (ref 135–145)
Total Bilirubin: 0.8 mg/dL (ref 0.3–1.2)
Total Protein: 6.5 g/dL (ref 6.5–8.1)

## 2022-01-05 LAB — CBC
HCT: 39 % (ref 39.0–52.0)
Hemoglobin: 12.8 g/dL — ABNORMAL LOW (ref 13.0–17.0)
MCH: 27.1 pg (ref 26.0–34.0)
MCHC: 32.8 g/dL (ref 30.0–36.0)
MCV: 82.6 fL (ref 80.0–100.0)
Platelets: 153 10*3/uL (ref 150–400)
RBC: 4.72 MIL/uL (ref 4.22–5.81)
RDW: 13.8 % (ref 11.5–15.5)
WBC: 4.5 10*3/uL (ref 4.0–10.5)
nRBC: 0 % (ref 0.0–0.2)

## 2022-01-05 LAB — LIPID PANEL
Cholesterol: 133 mg/dL (ref 0–200)
HDL: 51 mg/dL (ref >40)
LDL Cholesterol: 64 mg/dL (ref 0–99)
Total CHOL/HDL Ratio: 2.6 RATIO
Triglycerides: 88 mg/dL (ref <150)
VLDL: 18 mg/dL (ref 0–40)

## 2022-01-05 NOTE — Assessment & Plan Note (Signed)
Ejection fraction is improved from 20% up to 45%.  He is feeling well.  NYHA class I.  Continuing with carvedilol, hydralazine, isosorbide.  Also on amlodipine for blood pressure.

## 2022-01-05 NOTE — Patient Instructions (Signed)
Medication Instructions:  Your physician recommends that you continue on your current medications as directed. Please refer to the Current Medication list given to you today.   Labwork: CMET CBC LIPID  Testing/Procedures: None  Follow-Up: Follow up in 6 months with an APP.   Any Other Special Instructions Will Be Listed Below (If Applicable).     If you need a refill on your cardiac medications before your next appointment, please call your pharmacy.

## 2022-01-05 NOTE — Assessment & Plan Note (Signed)
Currently on pravastatin 10 mg a day.  We will go ahead and check lipid panel.

## 2022-01-05 NOTE — Assessment & Plan Note (Signed)
No further recurrence.  Could have been related to previous alcohol use.  Eliquis 5 mg twice a day was on his medication list once again.  Previously this was conveyed to him the importance of taking given the possibility of atrial fibrillation/flutter return.  When I asked him about this again today, he did not believe that he was currently taking the medication.

## 2022-01-05 NOTE — Progress Notes (Signed)
[facility] Office Note:    Date:  01/05/2022   ID:  Timothy [name], DOB 11/24/1955, MRN [id number]  PCP:  Yves Dill, NP   [facility] Providers Cardiologist:  None     Referring MD: Ginny Forth*    History of Present Illness:    Timothy [name] is a 66 y.o. male here for the follow-up of chronic systolic heart failure with prior EF of 20% in May 2020.  This was in the setting of tachycardia/atrial flutter.  He auto converted with carvedilol.  Previously had stopped his Eliquis secondary to cost.  May have a drink once a month.  Retired Administrator.  Overall he feels well.  He does a lot of yard work.  No limitations.  NYHA class I.  No syncope no chest pain.  He states that he is taking his medications.  He is not taking Eliquis for quite some time.  Past Medical History:  Diagnosis Date   Alcohol abuse    CHF (congestive heart failure) (HCC)    CKD (chronic kidney disease) stage 3, GFR 30-59 ml/min (HCC)    Hyperlipidemia    Hypertension    Noncompliance     History reviewed. No pertinent surgical history.  Current Medications: Current Meds  Medication Sig   amLODipine (NORVASC) 10 MG tablet Take 1 tablet (10 mg total) by mouth daily for 30 days.   carvedilol (COREG) 25 MG tablet Take 1 tablet (25 mg total) by mouth 2 (two) times daily with a meal for 30 days.   hydrALAZINE (APRESOLINE) 50 MG tablet Take 1 tablet (50 mg total) by mouth 3 (three) times daily.   isosorbide mononitrate (IMDUR) 60 MG 24 hr tablet Take 1 tablet (60 mg total) by mouth daily.   ondansetron (ZOFRAN ODT) 4 MG disintegrating tablet Take 1 tablet (4 mg total) by mouth every 8 (eight) hours as needed.   pravastatin (PRAVACHOL) 10 MG tablet Take 1 tablet (10 mg total) by mouth daily at 6 PM for 30 days.   [DISCONTINUED] apixaban (ELIQUIS) 5 MG TABS tablet Take 1 tablet (5 mg total) by mouth 2 (two) times daily.     Allergies:   Patient has no known allergies.    Social History   Socioeconomic History   Marital status: Married    Spouse name: Not on file   Number of children: 3   Years of education: Not on file   Highest education level: Not on file  Occupational History   Occupation: Retired Administrator  Tobacco Use   Smoking status: Former    Types: Cigars   Smokeless tobacco: Never  Scientific laboratory technician Use: Never used  Substance and Sexual Activity   Alcohol use: Yes    Alcohol/week: 7.0 standard drinks    Types: 7 Cans of beer per week    Comment: daily- says quit last week   Drug use: No   Sexual activity: Not on file  Other Topics Concern   Not on file  Social History Narrative   Not on file   Social Determinants of Health   Financial Resource Strain: Not on file  Food Insecurity: Not on file  Transportation Needs: Not on file  Physical Activity: Not on file  Stress: Not on file  Social Connections: Not on file     Family History: The patient's family history includes Cancer in his father; Diabetes in his brother, mother, and sister; Hypertension in his brother, brother, father,  and mother.  ROS:   Please see the history of present illness.     All other systems reviewed and are negative.  EKGs/Labs/Other Studies Reviewed:    The following studies were reviewed today:  TTE 12/21/2018  1. The left ventricle has a visually estimated ejection fraction of  approximately 20%. Chamber size is normal and there is moderate left  ventricular hypertrophy. Left ventricular diastolic Doppler parameters are  indeterminate in the setting of atrial  flutter.   2. There is akinesis of the left ventricular, apical inferior wall.   3. There is akinesis of the left ventricular, basal inferior wall.   4. There is akinesis of the left ventricular, mid-apical anteroseptal  wall.   5. The right ventricle has normal systolic function. The cavity was  normal. There is no increase in right ventricular wall thickness. Right   ventricular systolic pressure is mildly elevated with an estimated  pressure of 38.2 mmHg.   6. Left atrial size was moderately dilated.   7. Right atrial size was moderately dilated.   8. Small pericardial effusion.   9. The pericardial effusion is localized near the right atrium.  10. The aortic valve is tricuspid. Aortic valve regurgitation is trivial  by color flow Doppler. Mild aortic annular calcification noted.  11. The mitral valve is grossly normal. Mild thickening of the mitral  valve leaflet. Mitral valve regurgitation is mild to moderate by color  flow Doppler.  12. The tricuspid valve is grossly normal. There is mild tricuspid  regurgitation.  13. The inferior vena cava was dilated in size with <50% respiratory  variability.   ECHO 11/24/21:    1. Left ventricular ejection fraction, by estimation, is 45 to 50%. The  left ventricle has mildly decreased function. The left ventricle  demonstrates global hypokinesis. There is severe asymmetric left  ventricular hypertrophy of the basal-septal  segment. Left ventricular diastolic parameters are indeterminate.   2. Right ventricular systolic function is normal. The right ventricular  size is normal.   3. Left atrial size was severely dilated.   4. Right atrial size was moderately dilated.   5. The pericardial effusion is circumferential.   6. The mitral valve is grossly normal. Trivial mitral valve  regurgitation. MVA by 2d Planimetry 3.86 cm2.   7. The aortic valve is tricuspid. There is mild calcification of the  aortic valve. Aortic valve regurgitation is trivial. No aortic stenosis is  present.   8. The inferior vena cava is normal in size with greater than 50%  respiratory variability, suggesting right atrial pressure of 3 mmHg.   Comparison(s): Significant improvement in LV function; notable  bradycardia.     EKG: Prior sinus bradycardia heart rate 57, first-degree AV block, LVH by voltage, nonspecific ST-T  changes, and was personally reviewed by me Recent Labs: No results found for requested labs within last 8760 hours.  Recent Lipid Panel    Component Value Date/Time   CHOL 204 (H) 02/16/2017 0918   TRIG 106 02/16/2017 0918   HDL 60 02/16/2017 0918   CHOLHDL 3.4 02/16/2017 0918   VLDL 21 02/16/2017 0918   LDLCALC 123 (H) 02/16/2017 0918                   Physical Exam:    VS:  BP (!) 108/56   Pulse 65   Ht '5\' 7"'$  (1.702 m)   Wt 176 lb (79.8 kg)   SpO2 96%   BMI 27.57 kg/m  Wt Readings from Last 3 Encounters:  01/05/22 176 lb (79.8 kg)  07/04/21 178 lb 12.8 oz (81.1 kg)  12/14/20 190 lb (86.2 kg)     GEN:  Well nourished, well developed in no acute distress HEENT: Normal NECK: No JVD; No carotid bruits LYMPHATICS: No lymphadenopathy CARDIAC: RRR, no murmurs, no rubs, gallops RESPIRATORY:  Clear to auscultation without rales, wheezing or rhonchi  ABDOMEN: Soft, non-tender, non-distended MUSCULOSKELETAL:  No edema; No deformity  SKIN: Warm and dry NEUROLOGIC:  Alert and oriented x 3 PSYCHIATRIC:  Normal affect   ASSESSMENT:    1. Chronic systolic heart failure ([place])   2. Typical atrial flutter (HCC)   3. Pure hypercholesterolemia   4. Acute renal failure superimposed on stage 3a chronic kidney disease, unspecified acute renal failure type ([place])    PLAN:    In order of problems listed above:  Chronic systolic heart failure (HCC) Ejection fraction is improved from 20% up to 45%.  He is feeling well.  NYHA class I.  Continuing with carvedilol, hydralazine, isosorbide.  Also on amlodipine for blood pressure.  Typical atrial flutter (HCC) No further recurrence.  Could have been related to previous alcohol use.  Eliquis 5 mg twice a day was on his medication list once again.  Previously this was conveyed to him the importance of taking given the possibility of atrial fibrillation/flutter return.  When I asked him about this again today, he did not believe that  he was currently taking the medication.  Hyperlipidemia Currently on pravastatin 10 mg a day.  We will go ahead and check lipid panel.  Acute renal failure superimposed on stage 3 chronic kidney disease ([place]) Checking lab work today.  He is not on ACE inhibitor or ARN I because of this.         Medication Adjustments/Labs and Tests Ordered: Current medicines are reviewed at length with the patient today.  Concerns regarding medicines are outlined above.  Orders Placed This Encounter  Procedures   CBC   Comprehensive Metabolic Panel (CMET)   Lipid panel   No orders of the defined types were placed in this encounter.   Patient Instructions  Medication Instructions:  Your physician recommends that you continue on your current medications as directed. Please refer to the Current Medication list given to you today.   Labwork: CMET CBC LIPID  Testing/Procedures: None  Follow-Up: Follow up in 6 months with an APP.   Any Other Special Instructions Will Be Listed Below (If Applicable).     If you need a refill on your cardiac medications before your next appointment, please call your pharmacy.    Signed, Candee Furbish, MD  01/05/2022 2:29 PM    [place]

## 2022-01-05 NOTE — Assessment & Plan Note (Signed)
Checking lab work today.  He is not on ACE inhibitor or ARN I because of this.

## 2022-01-24 ENCOUNTER — Encounter (HOSPITAL_COMMUNITY): Payer: Self-pay

## 2022-01-24 ENCOUNTER — Emergency Department (HOSPITAL_COMMUNITY): Payer: Medicare Other

## 2022-01-24 ENCOUNTER — Other Ambulatory Visit: Payer: Self-pay

## 2022-01-24 ENCOUNTER — Emergency Department (HOSPITAL_COMMUNITY)
Admission: EM | Admit: 2022-01-24 | Discharge: 2022-01-24 | Disposition: A | Discharge status: Home / Self Care | Payer: Medicare Other | Attending: Emergency Medicine | Admitting: Emergency Medicine

## 2022-01-24 DIAGNOSIS — Z79899 Other long term (current) drug therapy: Secondary | ICD-10-CM | POA: Insufficient documentation

## 2022-01-24 DIAGNOSIS — I130 Hypertensive heart and chronic kidney disease with heart failure and stage 1 through stage 4 chronic kidney disease, or unspecified chronic kidney disease: Secondary | ICD-10-CM | POA: Insufficient documentation

## 2022-01-24 DIAGNOSIS — W102XXA Fall (on)(from) incline, initial encounter: Secondary | ICD-10-CM | POA: Diagnosis not present

## 2022-01-24 DIAGNOSIS — I509 Heart failure, unspecified: Secondary | ICD-10-CM | POA: Insufficient documentation

## 2022-01-24 DIAGNOSIS — M79641 Pain in right hand: Secondary | ICD-10-CM | POA: Diagnosis not present

## 2022-01-24 DIAGNOSIS — N183 Chronic kidney disease, stage 3 unspecified: Secondary | ICD-10-CM | POA: Diagnosis not present

## 2022-01-24 DIAGNOSIS — S6991XA Unspecified injury of right wrist, hand and finger(s), initial encounter: Secondary | ICD-10-CM | POA: Diagnosis present

## 2022-01-24 DIAGNOSIS — S52521A Torus fracture of lower end of right radius, initial encounter for closed fracture: Secondary | ICD-10-CM | POA: Insufficient documentation

## 2022-01-24 DIAGNOSIS — Y92415 Exit ramp or entrance ramp of street or highway as the place of occurrence of the external cause: Secondary | ICD-10-CM | POA: Diagnosis not present

## 2022-01-24 MED ORDER — TRAMADOL HCL 50 MG PO TABS: 50.0000 mg | ORAL_TABLET | Freq: Four times a day (QID) | ORAL | 0 refills | Status: DC | PRN

## 2022-01-29 ENCOUNTER — Encounter: Payer: Self-pay | Admitting: Orthopedic Surgery

## 2022-01-29 ENCOUNTER — Ambulatory Visit: Payer: Medicare Other | Admitting: Orthopedic Surgery

## 2022-01-29 ENCOUNTER — Ambulatory Visit (INDEPENDENT_AMBULATORY_CARE_PROVIDER_SITE_OTHER): Payer: Medicare Other

## 2022-01-29 VITALS — Ht 67.0 in | Wt 149.9 lb

## 2022-01-29 DIAGNOSIS — S52551A Other extraarticular fracture of lower end of right radius, initial encounter for closed fracture: Secondary | ICD-10-CM

## 2022-01-29 DIAGNOSIS — S52501A Unspecified fracture of the lower end of right radius, initial encounter for closed fracture: Secondary | ICD-10-CM | POA: Insufficient documentation

## 2022-01-29 DIAGNOSIS — M25531 Pain in right wrist: Secondary | ICD-10-CM | POA: Diagnosis not present

## 2022-01-29 NOTE — Progress Notes (Signed)
Office Visit Note   Patient: Timothy [name]           Date of Birth: 03-Feb-1956           MRN: [id number] Visit Date: 01/29/2022              Requested by: Yves Dill, NP [address] [place],  [place] [address] PCP: Yves Dill, NP   Assessment & Plan: Visit Diagnoses:  1. Pain in right wrist   2. Other closed extra-articular fracture of distal end of right radius, initial encounter     Plan: Patient has a nondisplaced fracture of the right distal radius.  Fortunately, we can treat this conservatively.  We discussed cast immobilization versus a full-time removable wrist brace.  He is opted for a cast.  I can see him back in 2 weeks with repeat x-rays to make sure there is no displacement.  Follow-Up Instructions: No follow-ups on file.   Orders:  Orders Placed This Encounter  Procedures   XR Wrist Complete Right   No orders of the defined types were placed in this encounter.     Procedures: Casting  Date/Time: 01/29/2022 4:45 PM  Performed by: Sherilyn Cooter, MD Authorized by: Sherilyn Cooter, MD   Consent Given by:  Patient Site marked: the procedure site was marked   Timeout: prior to procedure the correct patient, procedure, and site was verified   Location:  Wrist  right wrist Fracture Type: distal radius   Neurovascularly intact   Distal Perfusion: normal   Distal Sensation: normal   Manipulation Performed?: No   Immobilization:  Cast Is this the patient's first cast for this injury?: Yes   Cast Type:  Short arm Supplies Used:  Fiberglass and cotton padding Neurovascularly intact   Distal Perfusion: normal   Distal Sensation: normal   Patient tolerance:  Patient tolerated the procedure well with no immediate complications    Clinical Data: No additional findings.   Subjective: Chief Complaint  Patient presents with   Right Wrist - Fracture    DOI: 01/23/22, states that he fell on a ramp    Is  a 66 year old right-hand-dominant male who presents for ER follow-up of a right wrist injury.  He was walking down a ramp when he tripped at the very end.  He landed on his outstretched right hand.  He was seen in the ER where x-rays suggested a nondisplaced right distal radius fracture.  Is placed in a splint and presents today for follow-up.  Is doing well today.  His pain is 3/10 localized to the distal radius.  His pain is overall improved from after the injury.  He also has a contracture of the right small finger at both the MP and PIP joints but this is a chronic issue for him and is not bothersome at all.    Review of Systems   Objective: Vital Signs: Ht '5\' 7"'$  (1.702 m)   Wt 149 lb 14.6 oz (68 kg)   BMI 23.48 kg/m   Physical Exam Constitutional:      Appearance: Normal appearance.  Cardiovascular:     Rate and Rhythm: Normal rate.     Pulses: Normal pulses.  Pulmonary:     Effort: Pulmonary effort is normal.  Skin:    General: Skin is warm and dry.     Capillary Refill: Capillary refill takes less than 2 seconds.  Neurological:     Mental Status: He is alert.  Right Hand Exam   Tenderness  Right hand tenderness location: TTP at dorsal aspect of distal radius.  No TTP at SL interval or radiocarpal joint.  Other  Erythema: absent Sensation: normal Pulse: present  Comments:  Wrist ROM limited by pain. Mild wrist swelling.  Able to make a full fist w/ full finger extension.       Specialty Comments:  No specialty comments available.  Imaging: No results found.   PMFS History: Patient Active Problem List   Diagnosis Date Noted   Closed fracture of right distal radius 40/81/4481   Chronic systolic heart failure ([place]) 01/05/2022   AKI (acute kidney injury) ([place]) 12/22/2018   Alcohol withdrawal syndrome with complication, with unspecified complication ([place]) 85/63/1497   Secondary cardiomyopathy (HCC)    Typical atrial flutter (HCC)    Hypertensive  emergency    Noncompliance    Acute renal failure superimposed on stage 3 chronic kidney disease ([place])    Alcohol abuse    Hx of atrial flutter 12/21/2018   Essential hypertension, benign 10/29/2015   Hyperlipidemia 10/29/2015   Obesity 10/29/2015   CKD (chronic kidney disease) 10/29/2015   Past Medical History:  Diagnosis Date   Alcohol abuse    CHF (congestive heart failure) (HCC)    CKD (chronic kidney disease) stage 3, GFR 30-59 ml/min ([place])    Hyperlipidemia    Hypertension    Noncompliance     Family History  Problem Relation Age of Onset   Diabetes Mother    Hypertension Mother    Cancer Father        Prostate Cancer   Hypertension Father    Diabetes Sister    Diabetes Brother    Hypertension Brother    Hypertension Brother     No past surgical history on file. Social History   Occupational History   Occupation: Retired Administrator  Tobacco Use   Smoking status: Former    Types: Cigars   Smokeless tobacco: Never  Scientific laboratory technician Use: Never used  Substance and Sexual Activity   Alcohol use: Yes    Alcohol/week: 7.0 standard drinks of alcohol    Types: 7 Cans of beer per week    Comment: daily- says quit last week   Drug use: No   Sexual activity: Not on file

## 2022-02-13 ENCOUNTER — Ambulatory Visit (INDEPENDENT_AMBULATORY_CARE_PROVIDER_SITE_OTHER): Payer: Medicare Other | Admitting: Orthopedic Surgery

## 2022-02-13 ENCOUNTER — Ambulatory Visit (INDEPENDENT_AMBULATORY_CARE_PROVIDER_SITE_OTHER): Payer: Medicare Other

## 2022-02-13 DIAGNOSIS — S52551A Other extraarticular fracture of lower end of right radius, initial encounter for closed fracture: Secondary | ICD-10-CM | POA: Diagnosis not present

## 2022-02-13 NOTE — Progress Notes (Signed)
Office Visit Note   Patient: Timothy [name]           Date of Birth: Apr 15, 1956           MRN: [id number] Visit Date: 02/13/2022              Requested by: Yves Dill, NP [address] [place],  [place] [address] PCP: Yves Dill, NP   Assessment & Plan: Visit Diagnoses:  1. Other closed extra-articular fracture of distal end of right radius, initial encounter     Plan: Patient is now approximately 3 weeks out from his injury.  He is doing well today.  He is got no issues with his cast.  Repeat x-rays today show unchanged fracture alignment that is within acceptable limits.  I can see him back in another 3 weeks at which time we can likely transition from a cast to a removable brace with x-rays out of the cast.  Follow-Up Instructions: No follow-ups on file.   Orders:  Orders Placed This Encounter  Procedures   XR Wrist Complete Right   No orders of the defined types were placed in this encounter.     Procedures: No procedures performed   Clinical Data: No additional findings.   Subjective: Chief Complaint  Patient presents with   Right Wrist - Follow-up, Fracture    This is a 66 year old right-hand-dominant male presents for follow-up of right distal radius fracture.  He tripped at the end of a ramp around 3 weeks ago.  He was seen in the ER where x-rays suggested a nondisplaced distal radius fracture.  He has been placed in a short arm cast since the injury.  He is doing well today.  He has no complaints.  He has no pain in his wrist.    Review of Systems   Objective: Vital Signs: There were no vitals taken for this visit.  Physical Exam  Right Hand Exam   Other  Erythema: absent Sensation: normal Pulse: present  Comments:  Cast clean and dry.  No areas of skin irritation.  SILT throughout exposed fingers.       Specialty Comments:  No specialty comments available.  Imaging: No results  found.   PMFS History: Patient Active Problem List   Diagnosis Date Noted   Closed fracture of right distal radius 49/70/2637   Chronic systolic heart failure ([place]) 01/05/2022   AKI (acute kidney injury) ([place]) 12/22/2018   Alcohol withdrawal syndrome with complication, with unspecified complication ([place]) 85/88/5027   Secondary cardiomyopathy (HCC)    Typical atrial flutter (HCC)    Hypertensive emergency    Noncompliance    Acute renal failure superimposed on stage 3 chronic kidney disease ([place])    Alcohol abuse    Hx of atrial flutter 12/21/2018   Essential hypertension, benign 10/29/2015   Hyperlipidemia 10/29/2015   Obesity 10/29/2015   CKD (chronic kidney disease) 10/29/2015   Past Medical History:  Diagnosis Date   Alcohol abuse    CHF (congestive heart failure) (HCC)    CKD (chronic kidney disease) stage 3, GFR 30-59 ml/min (HCC)    Hyperlipidemia    Hypertension    Noncompliance     Family History  Problem Relation Age of Onset   Diabetes Mother    Hypertension Mother    Cancer Father        Prostate Cancer   Hypertension Father    Diabetes Sister    Diabetes Brother  Hypertension Brother    Hypertension Brother     No past surgical history on file. Social History   Occupational History   Occupation: Retired Administrator  Tobacco Use   Smoking status: Former    Types: Cigars   Smokeless tobacco: Never  Scientific laboratory technician Use: Never used  Substance and Sexual Activity   Alcohol use: Yes    Alcohol/week: 7.0 standard drinks of alcohol    Types: 7 Cans of beer per week    Comment: daily- says quit last week   Drug use: No   Sexual activity: Not on file

## 2022-03-06 ENCOUNTER — Ambulatory Visit: Payer: Medicare Other | Admitting: Orthopedic Surgery

## 2022-04-01 ENCOUNTER — Other Ambulatory Visit: Payer: Self-pay | Admitting: Cardiovascular Disease

## 2022-06-17 ENCOUNTER — Emergency Department (HOSPITAL_COMMUNITY)
Admission: EM | Admit: 2022-06-17 | Discharge: 2022-06-17 | Disposition: A | Discharge status: Home / Self Care | Payer: Medicare Other | Attending: Emergency Medicine | Admitting: Emergency Medicine

## 2022-06-17 ENCOUNTER — Encounter (HOSPITAL_COMMUNITY): Payer: Self-pay

## 2022-06-17 ENCOUNTER — Emergency Department (HOSPITAL_COMMUNITY): Payer: Medicare Other

## 2022-06-17 ENCOUNTER — Other Ambulatory Visit: Payer: Self-pay

## 2022-06-17 DIAGNOSIS — K209 Esophagitis, unspecified without bleeding: Secondary | ICD-10-CM | POA: Diagnosis not present

## 2022-06-17 DIAGNOSIS — R1030 Lower abdominal pain, unspecified: Secondary | ICD-10-CM | POA: Diagnosis present

## 2022-06-17 DIAGNOSIS — Z79899 Other long term (current) drug therapy: Secondary | ICD-10-CM | POA: Diagnosis not present

## 2022-06-17 DIAGNOSIS — K4090 Unilateral inguinal hernia, without obstruction or gangrene, not specified as recurrent: Secondary | ICD-10-CM | POA: Diagnosis not present

## 2022-06-17 LAB — COMPREHENSIVE METABOLIC PANEL
ALT: 16 U/L (ref 0–44)
AST: 21 U/L (ref 15–41)
Albumin: 4.5 g/dL (ref 3.5–5.0)
Alkaline Phosphatase: 69 U/L (ref 38–126)
Anion gap: 9 (ref 5–15)
BUN: 16 mg/dL (ref 8–23)
CO2: 26 mmol/L (ref 22–32)
Calcium: 9.3 mg/dL (ref 8.9–10.3)
Chloride: 101 mmol/L (ref 98–111)
Creatinine, Ser: 1.4 mg/dL — ABNORMAL HIGH (ref 0.61–1.24)
GFR, Estimated: 55 mL/min — ABNORMAL LOW (ref >60)
Glucose, Bld: 152 mg/dL — ABNORMAL HIGH (ref 70–99)
Potassium: 3.9 mmol/L (ref 3.5–5.1)
Sodium: 136 mmol/L (ref 135–145)
Total Bilirubin: 1.3 mg/dL — ABNORMAL HIGH (ref 0.3–1.2)
Total Protein: 8 g/dL (ref 6.5–8.1)

## 2022-06-17 LAB — CBC WITH DIFFERENTIAL/PLATELET
Abs Immature Granulocytes: 0.02 10*3/uL (ref 0.00–0.07)
Basophils Absolute: 0.1 10*3/uL (ref 0.0–0.1)
Basophils Relative: 1 %
Eosinophils Absolute: 0.3 10*3/uL (ref 0.0–0.5)
Eosinophils Relative: 5 %
HCT: 47.7 % (ref 39.0–52.0)
Hemoglobin: 15.9 g/dL (ref 13.0–17.0)
Immature Granulocytes: 0 %
Lymphocytes Relative: 12 %
Lymphs Abs: 0.8 10*3/uL (ref 0.7–4.0)
MCH: 27.8 pg (ref 26.0–34.0)
MCHC: 33.3 g/dL (ref 30.0–36.0)
MCV: 83.4 fL (ref 80.0–100.0)
Monocytes Absolute: 0.2 10*3/uL (ref 0.1–1.0)
Monocytes Relative: 3 %
Neutro Abs: 5.6 10*3/uL (ref 1.7–7.7)
Neutrophils Relative %: 79 %
Platelets: 128 10*3/uL — ABNORMAL LOW (ref 150–400)
RBC: 5.72 MIL/uL (ref 4.22–5.81)
RDW: 13.7 % (ref 11.5–15.5)
WBC: 7.1 10*3/uL (ref 4.0–10.5)
nRBC: 0 % (ref 0.0–0.2)

## 2022-06-17 LAB — URINALYSIS, ROUTINE W REFLEX MICROSCOPIC
Bacteria, UA: NONE SEEN
Bilirubin Urine: NEGATIVE
Glucose, UA: 50 mg/dL — AB
Ketones, ur: 5 mg/dL — AB
Leukocytes,Ua: NEGATIVE
Nitrite: NEGATIVE
Protein, ur: 100 mg/dL — AB
Specific Gravity, Urine: 1.009 (ref 1.005–1.030)
pH: 7 (ref 5.0–8.0)

## 2022-06-17 MED ORDER — IOHEXOL 300 MG/ML  SOLN
100.0000 mL | Freq: Once | INTRAMUSCULAR | Status: AC | PRN
  Administered 2022-06-17: 100 mL via INTRAVENOUS

## 2022-06-17 MED ORDER — PANTOPRAZOLE SODIUM 20 MG PO TBEC: 20.0000 mg | DELAYED_RELEASE_TABLET | Freq: Every day | ORAL | 0 refills | Status: DC

## 2022-06-17 MED ORDER — SODIUM CHLORIDE 0.9 % IV BOLUS
500.0000 mL | Freq: Once | INTRAVENOUS | Status: AC
  Administered 2022-06-17: 500 mL via INTRAVENOUS

## 2022-06-17 MED ORDER — HYDROMORPHONE HCL 1 MG/ML IJ SOLN
1.0000 mg | Freq: Once | INTRAMUSCULAR | Status: AC
  Administered 2022-06-17: 1 mg via INTRAVENOUS
  Filled 2022-06-17: qty 1

## 2022-06-17 MED ORDER — ONDANSETRON HCL 4 MG/2ML IJ SOLN
4.0000 mg | Freq: Once | INTRAMUSCULAR | Status: AC
Start: 2022-06-17 — End: 2022-06-17
  Administered 2022-06-17: 4 mg via INTRAVENOUS
  Filled 2022-06-17: qty 2

## 2022-06-17 MED ORDER — LORAZEPAM 2 MG/ML IJ SOLN
0.5000 mg | Freq: Once | INTRAMUSCULAR | Status: AC
Start: 2022-06-17 — End: 2022-06-17
  Administered 2022-06-17: 0.5 mg via INTRAVENOUS
  Filled 2022-06-17: qty 1

## 2022-06-17 NOTE — ED Provider Notes (Addendum)
[facility] EMERGENCY DEPARTMENT Provider Note   CSN: [id number] Arrival date & time: 06/17/22  1228     History  Chief Complaint  Patient presents with   Groin Pain    Timothy [name] is a 66 y.o. male.  Patient complains of right inguinal pain, he also complains of a mass in his right inguinal area.  Patient has been vomiting today.  Patient has a history of hypertension and elevated cholesterol  The history is provided by the patient and medical records. No language interpreter was used.  Groin Pain This is a new problem. The problem occurs constantly. The problem has not changed since onset.Associated symptoms include abdominal pain. Pertinent negatives include no chest pain and no headaches. Nothing aggravates the symptoms. Nothing relieves the symptoms. He has tried nothing for the symptoms. The treatment provided no relief.       Home Medications Prior to Admission medications   Medication Sig Start Date End Date Taking? Authorizing Provider  amLODipine (NORVASC) 10 MG tablet Take 1 tablet (10 mg total) by mouth daily for 30 days. 12/24/18 06/17/22 Yes Arrien, Jimmy Picket, MD  carvedilol (COREG) 25 MG tablet Take 1 tablet (25 mg total) by mouth 2 (two) times daily with a meal for 30 days. 12/23/18 06/17/22 Yes Arrien, Jimmy Picket, MD  furosemide (LASIX) 20 MG tablet Take 20 mg by mouth daily as needed for fluid. 02/06/22  Yes [provider]  hydrALAZINE (APRESOLINE) 50 MG tablet Take 1 tablet (50 mg total) by mouth 3 (three) times daily. 07/04/21 06/17/22 Yes O'Neal, Cassie Freer, MD  isosorbide mononitrate (IMDUR) 60 MG 24 hr tablet TAKE ONE TABLET BY MOUTH ONCE DAILY. 04/02/22  Yes Jerline Pain, MD  ondansetron (ZOFRAN ODT) 4 MG disintegrating tablet Take 1 tablet (4 mg total) by mouth every 8 (eight) hours as needed. 12/14/20  Yes Fredia Sorrow, MD  pravastatin (PRAVACHOL) 10 MG tablet Take 1 tablet (10 mg total) by mouth daily at 6 PM for 30 days.  12/23/18 06/17/22 Yes Arrien, Jimmy Picket, MD  traMADol (ULTRAM) 50 MG tablet Take 1 tablet (50 mg total) by mouth every 6 (six) hours as needed. 01/24/22  Yes Fredia Sorrow, MD      Allergies    Patient has no known allergies.    Review of Systems   Review of Systems  Constitutional:  Negative for appetite change and fatigue.  HENT:  Negative for congestion, ear discharge and sinus pressure.   Eyes:  Negative for discharge.  Respiratory:  Negative for cough.   Cardiovascular:  Negative for chest pain.  Gastrointestinal:  Positive for abdominal pain. Negative for diarrhea.  Genitourinary:  Negative for frequency and hematuria.  Musculoskeletal:  Negative for back pain.  Skin:  Negative for rash.  Neurological:  Negative for seizures and headaches.  Psychiatric/Behavioral:  Negative for hallucinations.     Physical Exam Updated Vital Signs BP 139/76   Pulse (!) 56   Temp 97.7 F (36.5 C) (Oral)   Resp 18   Ht '5\' 7"'$  (1.702 m)   Wt 77.1 kg   SpO2 95%   BMI 26.63 kg/m  Physical Exam Vitals and nursing note reviewed.  Constitutional:      Appearance: He is well-developed.  HENT:     Head: Normocephalic.     Nose: Nose normal.  Eyes:     General: No scleral icterus.    Conjunctiva/sclera: Conjunctivae normal.  Neck:     Thyroid: No thyromegaly.  Cardiovascular:  Rate and Rhythm: Normal rate and regular rhythm.     Heart sounds: No murmur heard.    No friction rub. No gallop.  Pulmonary:     Breath sounds: No stridor. No wheezing or rales.  Chest:     Chest wall: No tenderness.  Abdominal:     General: There is no distension.     Tenderness: There is no abdominal tenderness. There is no rebound.  Genitourinary:    Comments: Large right inguinal hernia with protrusion equal to about half of an orange..  The area is tender Musculoskeletal:        General: Normal range of motion.     Cervical back: Neck supple.  Lymphadenopathy:     Cervical: No cervical  adenopathy.  Skin:    Findings: No erythema or rash.  Neurological:     Mental Status: He is alert and oriented to person, place, and time.     Motor: No abnormal muscle tone.     Coordination: Coordination normal.  Psychiatric:        Behavior: Behavior normal.     ED Results / Procedures / Treatments   Labs (all labs ordered are listed, but only abnormal results are displayed) Labs Reviewed  CBC WITH DIFFERENTIAL/PLATELET - Abnormal; Notable for the following components:      Result Value   Platelets 128 (*)    All other components within normal limits  COMPREHENSIVE METABOLIC PANEL - Abnormal; Notable for the following components:   Glucose, Bld 152 (*)    Creatinine, Ser 1.40 (*)    Total Bilirubin 1.3 (*)    GFR, Estimated 55 (*)    All other components within normal limits  URINALYSIS, ROUTINE W REFLEX MICROSCOPIC    EKG None  Radiology No results found.  Procedures Procedures    Medications Ordered in ED Medications  sodium chloride 0.9 % bolus 500 mL (500 mLs Intravenous New Bag/Given 06/17/22 1412)  HYDROmorphone (DILAUDID) injection 1 mg (1 mg Intravenous Given 06/17/22 1412)  LORazepam (ATIVAN) injection 0.5 mg (0.5 mg Intravenous Given 06/17/22 1412)  ondansetron (ZOFRAN) injection 4 mg (4 mg Intravenous Given 06/17/22 1412)    ED Course/ Medical Decision Making/ A&P  Patient with a right inguinal hernia that was reduced without difficulty by me.  We will get a CT scan of her abdomen                         Medical Decision Making Amount and/or Complexity of Data Reviewed Labs: ordered. Radiology: ordered.  Risk Prescription drug management.   Right inguinal hernia.  Disposition will be determined by my colleagues after the CT scan is done        Final Clinical Impression(s) / ED Diagnoses Final diagnoses:  None    Rx / DC Orders ED Discharge Orders     None         Milton Ferguson, MD 06/19/22 1801    Milton Ferguson,  MD 06/19/22 [phone]

## 2022-06-17 NOTE — ED Notes (Signed)
Dc instructions and scripts reviewed with pt no questions or concerns at this time. Pt to follow up with GI and surgeon regarding hernia.

## 2022-06-17 NOTE — Discharge Instructions (Addendum)
It was a pleasure caring for you today in the emergency department.  Please follow up with gastroenterology for EGD/endoscopy, please see Dr Gala Romney if you do not have GI doctor   Please return to the emergency department for any worsening or worrisome symptoms.

## 2022-06-17 NOTE — ED Triage Notes (Signed)
R Groin pain started this morning. N/v started this morning.

## 2022-06-17 NOTE — ED Notes (Signed)
Patient transported to CT 

## 2022-06-17 NOTE — ED Notes (Signed)
Lab contacted regarding Urinalysis results. Per Lab instrument issue and machine was down results should be coming across soon

## 2022-06-17 NOTE — ED Provider Notes (Signed)
  Provider Note MRN:  [id number]  Arrival date & time: 06/17/22    ED Course and Medical Decision Making  Assumed care from Dr Roderic Palau at shift change.  See note from prior team for complete details, in brief:  66 yo male Here today w/ groin pain Found to have inguinal hernia, was reduced at bedside, pain resolved, tolerating PO Labs stable CT pending   Plan per prior physician follow up CT CT reviewed, concerning for possible esophagitis. He has some gi upset. Will start ppi, have him f/u with gi for endoscopy Will give gen surg f/u for inguinal hernia  The patient improved significantly and was discharged in stable condition. Detailed discussions were had with the patient regarding current findings, and need for close f/u with PCP or on call doctor. The patient has been instructed to return immediately if the symptoms worsen in any way for re-evaluation. Patient verbalized understanding and is in agreement with current care plan. All questions answered prior to discharge.     Procedures  Final Clinical Impressions(s) / ED Diagnoses     ICD-10-CM   1. Unilateral inguinal hernia without obstruction or gangrene, recurrence not specified  K40.90     2. Esophagitis  K20.90       ED Discharge Orders          Ordered    pantoprazole (PROTONIX) 20 MG tablet  Daily        06/17/22 1838              Discharge Instructions      It was a pleasure caring for you today in the emergency department.  Please follow up with gastroenterology for EGD/endoscopy, please see Dr Gala Romney if you do not have GI doctor   Please return to the emergency department for any worsening or worrisome symptoms.          Jeanell Sparrow, DO 06/17/22 1843

## 2022-07-07 ENCOUNTER — Encounter (INDEPENDENT_AMBULATORY_CARE_PROVIDER_SITE_OTHER): Payer: Medicare Other | Admitting: Internal Medicine

## 2022-07-07 NOTE — Progress Notes (Signed)
Erroneous encounter - please disregard.

## 2022-07-08 ENCOUNTER — Encounter: Payer: Self-pay | Admitting: Internal Medicine

## 2022-07-16 ENCOUNTER — Encounter (HOSPITAL_COMMUNITY): Payer: Self-pay | Admitting: Emergency Medicine

## 2022-07-16 ENCOUNTER — Inpatient Hospital Stay (HOSPITAL_COMMUNITY)
Admission: EM | Admit: 2022-07-16 | Discharge: 2022-07-18 | DRG: 352 | Disposition: A | Discharge status: Home / Self Care | Payer: Medicare Other | Attending: Internal Medicine | Admitting: Internal Medicine

## 2022-07-16 ENCOUNTER — Other Ambulatory Visit: Payer: Self-pay

## 2022-07-16 ENCOUNTER — Emergency Department (HOSPITAL_COMMUNITY): Payer: Medicare Other

## 2022-07-16 DIAGNOSIS — K56609 Unspecified intestinal obstruction, unspecified as to partial versus complete obstruction: Secondary | ICD-10-CM | POA: Diagnosis present

## 2022-07-16 DIAGNOSIS — E782 Mixed hyperlipidemia: Secondary | ICD-10-CM | POA: Diagnosis present

## 2022-07-16 DIAGNOSIS — Z20822 Contact with and (suspected) exposure to covid-19: Secondary | ICD-10-CM | POA: Diagnosis present

## 2022-07-16 DIAGNOSIS — I129 Hypertensive chronic kidney disease with stage 1 through stage 4 chronic kidney disease, or unspecified chronic kidney disease: Secondary | ICD-10-CM | POA: Diagnosis present

## 2022-07-16 DIAGNOSIS — Z8249 Family history of ischemic heart disease and other diseases of the circulatory system: Secondary | ICD-10-CM

## 2022-07-16 DIAGNOSIS — F1410 Cocaine abuse, uncomplicated: Secondary | ICD-10-CM | POA: Diagnosis not present

## 2022-07-16 DIAGNOSIS — I10 Essential (primary) hypertension: Secondary | ICD-10-CM | POA: Diagnosis not present

## 2022-07-16 DIAGNOSIS — Z79899 Other long term (current) drug therapy: Secondary | ICD-10-CM | POA: Diagnosis not present

## 2022-07-16 DIAGNOSIS — F1910 Other psychoactive substance abuse, uncomplicated: Secondary | ICD-10-CM | POA: Insufficient documentation

## 2022-07-16 DIAGNOSIS — R112 Nausea with vomiting, unspecified: Secondary | ICD-10-CM | POA: Diagnosis not present

## 2022-07-16 DIAGNOSIS — D179 Benign lipomatous neoplasm, unspecified: Secondary | ICD-10-CM | POA: Diagnosis present

## 2022-07-16 DIAGNOSIS — K219 Gastro-esophageal reflux disease without esophagitis: Secondary | ICD-10-CM | POA: Diagnosis present

## 2022-07-16 DIAGNOSIS — K460 Unspecified abdominal hernia with obstruction, without gangrene: Secondary | ICD-10-CM | POA: Diagnosis not present

## 2022-07-16 DIAGNOSIS — I130 Hypertensive heart and chronic kidney disease with heart failure and stage 1 through stage 4 chronic kidney disease, or unspecified chronic kidney disease: Secondary | ICD-10-CM | POA: Diagnosis not present

## 2022-07-16 DIAGNOSIS — Z87891 Personal history of nicotine dependence: Secondary | ICD-10-CM

## 2022-07-16 DIAGNOSIS — I5022 Chronic systolic (congestive) heart failure: Secondary | ICD-10-CM | POA: Diagnosis not present

## 2022-07-16 DIAGNOSIS — I4891 Unspecified atrial fibrillation: Secondary | ICD-10-CM | POA: Diagnosis present

## 2022-07-16 DIAGNOSIS — K4030 Unilateral inguinal hernia, with obstruction, without gangrene, not specified as recurrent: Principal | ICD-10-CM | POA: Diagnosis present

## 2022-07-16 DIAGNOSIS — Z8679 Personal history of other diseases of the circulatory system: Secondary | ICD-10-CM

## 2022-07-16 DIAGNOSIS — F1010 Alcohol abuse, uncomplicated: Secondary | ICD-10-CM | POA: Diagnosis present

## 2022-07-16 DIAGNOSIS — N1831 Chronic kidney disease, stage 3a: Secondary | ICD-10-CM | POA: Diagnosis present

## 2022-07-16 DIAGNOSIS — K4031 Unilateral inguinal hernia, with obstruction, without gangrene, recurrent: Secondary | ICD-10-CM | POA: Diagnosis not present

## 2022-07-16 DIAGNOSIS — K4090 Unilateral inguinal hernia, without obstruction or gangrene, not specified as recurrent: Secondary | ICD-10-CM

## 2022-07-16 LAB — COMPREHENSIVE METABOLIC PANEL
ALT: 12 U/L (ref 0–44)
AST: 18 U/L (ref 15–41)
Albumin: 4.5 g/dL (ref 3.5–5.0)
Alkaline Phosphatase: 62 U/L (ref 38–126)
Anion gap: 8 (ref 5–15)
BUN: 18 mg/dL (ref 8–23)
CO2: 26 mmol/L (ref 22–32)
Calcium: 9.3 mg/dL (ref 8.9–10.3)
Chloride: 103 mmol/L (ref 98–111)
Creatinine, Ser: 1.64 mg/dL — ABNORMAL HIGH (ref 0.61–1.24)
GFR, Estimated: 45 mL/min — ABNORMAL LOW (ref >60)
Glucose, Bld: 129 mg/dL — ABNORMAL HIGH (ref 70–99)
Potassium: 3.9 mmol/L (ref 3.5–5.1)
Sodium: 137 mmol/L (ref 135–145)
Total Bilirubin: 1.3 mg/dL — ABNORMAL HIGH (ref 0.3–1.2)
Total Protein: 8 g/dL (ref 6.5–8.1)

## 2022-07-16 LAB — CBC WITH DIFFERENTIAL/PLATELET
Abs Immature Granulocytes: 0.01 10*3/uL (ref 0.00–0.07)
Basophils Absolute: 0.1 10*3/uL (ref 0.0–0.1)
Basophils Relative: 2 %
Eosinophils Absolute: 0.4 10*3/uL (ref 0.0–0.5)
Eosinophils Relative: 7 %
HCT: 47.1 % (ref 39.0–52.0)
Hemoglobin: 15.6 g/dL (ref 13.0–17.0)
Immature Granulocytes: 0 %
Lymphocytes Relative: 17 %
Lymphs Abs: 1 10*3/uL (ref 0.7–4.0)
MCH: 27.7 pg (ref 26.0–34.0)
MCHC: 33.1 g/dL (ref 30.0–36.0)
MCV: 83.7 fL (ref 80.0–100.0)
Monocytes Absolute: 0.3 10*3/uL (ref 0.1–1.0)
Monocytes Relative: 5 %
Neutro Abs: 4.1 10*3/uL (ref 1.7–7.7)
Neutrophils Relative %: 69 %
Platelets: 164 10*3/uL (ref 150–400)
RBC: 5.63 MIL/uL (ref 4.22–5.81)
RDW: 13.7 % (ref 11.5–15.5)
WBC: 5.8 10*3/uL (ref 4.0–10.5)
nRBC: 0 % (ref 0.0–0.2)

## 2022-07-16 LAB — LACTIC ACID, PLASMA: Lactic Acid, Venous: 0.9 mmol/L (ref 0.5–1.9)

## 2022-07-16 MED ORDER — MORPHINE SULFATE (PF) 4 MG/ML IV SOLN
4.0000 mg | Freq: Once | INTRAVENOUS | Status: AC
  Administered 2022-07-16: 4 mg via INTRAVENOUS
  Filled 2022-07-16: qty 1

## 2022-07-16 MED ORDER — CARVEDILOL 12.5 MG PO TABS: 25.0000 mg | ORAL_TABLET | Freq: Two times a day (BID) | ORAL | Status: DC

## 2022-07-16 MED ORDER — PANTOPRAZOLE SODIUM 40 MG IV SOLR
40.0000 mg | Freq: Every day | INTRAVENOUS | Status: DC
  Administered 2022-07-17 (×2): 40 mg via INTRAVENOUS
  Filled 2022-07-16 (×2): qty 10

## 2022-07-16 MED ORDER — IOHEXOL 300 MG/ML  SOLN
100.0000 mL | Freq: Once | INTRAMUSCULAR | Status: AC | PRN
  Administered 2022-07-16: 100 mL via INTRAVENOUS

## 2022-07-16 MED ORDER — SODIUM CHLORIDE 0.9 % IV SOLN: INTRAVENOUS | Status: AC

## 2022-07-16 MED ORDER — MORPHINE SULFATE (PF) 4 MG/ML IV SOLN: 4.0000 mg | Freq: Once | INTRAVENOUS | Status: DC

## 2022-07-16 MED ORDER — HYDRALAZINE HCL 20 MG/ML IJ SOLN: 10.0000 mg | Freq: Four times a day (QID) | INTRAMUSCULAR | Status: DC | PRN

## 2022-07-16 MED ORDER — CARVEDILOL 12.5 MG PO TABS
25.0000 mg | ORAL_TABLET | Freq: Two times a day (BID) | ORAL | Status: DC
  Administered 2022-07-16 – 2022-07-18 (×3): 25 mg via ORAL
  Filled 2022-07-16 (×4): qty 2

## 2022-07-16 MED ORDER — HYDROMORPHONE HCL 1 MG/ML IJ SOLN: 0.5000 mg | INTRAMUSCULAR | Status: DC | PRN

## 2022-07-16 NOTE — H&P (Signed)
History and Physical    Patient: Timothy [name]:[id number] DOB: 09/22/1955 DOA: 07/16/2022 DOS: the patient was seen and examined on 07/17/2022 PCP: Yves Dill, NP  Patient coming from: Home  Chief Complaint:  Chief Complaint  Patient presents with   Abdominal Pain   HPI: Timothy [name] is a 66 y.o. male with medical history significant of hypertension, hyperlipidemia, right inguinal hernia who presents to the emergency department due to right inguinal hernia pain.  He complained of pain in the right inguinal hernia area which started this morning and was associated with nausea and 3 episodes of nonbloody vomiting.  Last bowel movement was this morning and was normal.  He presented to the ED with the same presentation on 06/17/2022, he was treated with IV Dilaudid, Zofran, IV hydration and Ativan at that time and was discharged home.  He denies fever, chills, chest pain, shortness of breath.  ED Course:  In the emergency department, he was bradycardic, BP was 179/80, but other vital signs were within normal range.  Workup in the ED shows normal CBC and BMP except for blood glucose of 129 and creatinine of 1.64 (baseline creatinine at 1.4-1.7).  Lactic acid 0.9. CT abdomen and pelvis with contrast showed right inguinal hernia containing small bowel loop which appears to be incarcerated causing small bowel obstruction. General surgery was consulted and recommended reduction of the hernia, n.p.o. and NGT.  Admission the patient was recommended with plan to take patient to the OR in the morning.  Hospitalist was asked to admit patient for further evaluation and management.  Review of Systems: Review of systems as noted in the HPI. All other systems reviewed and are negative.   Past Medical History:  Diagnosis Date   Alcohol abuse    CHF (congestive heart failure) (HCC)    CKD (chronic kidney disease) stage 3, GFR 30-59 ml/min (HCC)    Hyperlipidemia    Hypertension     Noncompliance    History reviewed. No pertinent surgical history.  Social History:  reports that he has quit smoking. His smoking use included cigars. He has never used smokeless tobacco. He reports current alcohol use of about 7.0 standard drinks of alcohol per week. He reports current drug use. Drug: Cocaine.   No Known Allergies  Family History  Problem Relation Age of Onset   Diabetes Mother    Hypertension Mother    Cancer Father        Prostate Cancer   Hypertension Father    Diabetes Sister    Diabetes Brother    Hypertension Brother    Hypertension Brother      Prior to Admission medications   Medication Sig Start Date End Date Taking? Authorizing Provider  amLODipine (NORVASC) 10 MG tablet Take 1 tablet (10 mg total) by mouth daily for 30 days. 12/24/18 07/16/22 Yes Arrien, Jimmy Picket, MD  carvedilol (COREG) 25 MG tablet Take 1 tablet (25 mg total) by mouth 2 (two) times daily with a meal for 30 days. 12/23/18 07/16/22 Yes Arrien, Jimmy Picket, MD  furosemide (LASIX) 20 MG tablet Take 20 mg by mouth daily as needed for fluid. 02/06/22  Yes [provider]  hydrALAZINE (APRESOLINE) 50 MG tablet Take 1 tablet (50 mg total) by mouth 3 (three) times daily. 07/04/21 07/16/22 Yes O'Neal, Cassie Freer, MD  isosorbide dinitrate (ISORDIL) 20 MG tablet Take 20 mg by mouth 3 (three) times daily.   Yes [provider]  isosorbide mononitrate (IMDUR) 60 MG  24 hr tablet TAKE ONE TABLET BY MOUTH ONCE DAILY. 04/02/22  Yes Jerline Pain, MD  pravastatin (PRAVACHOL) 20 MG tablet Take 20 mg by mouth daily. 06/25/22  Yes [provider]  pantoprazole (PROTONIX) 20 MG tablet Take 1 tablet (20 mg total) by mouth daily for 14 days. Patient not taking: Reported on 07/16/2022 06/17/22 07/01/22  Jeanell Sparrow, DO  traMADol (ULTRAM) 50 MG tablet Take 1 tablet (50 mg total) by mouth every 6 (six) hours as needed. Patient not taking: Reported on 07/16/2022 01/24/22    Fredia Sorrow, MD    Physical Exam: BP (!) 155/86   Pulse 69   Temp 98.2 F (36.8 C)   Resp 18   Ht '5\' 7"'$  (1.702 m)   Wt 73.6 kg   SpO2 100%   BMI 25.41 kg/m   General: 66 y.o. year-old male well developed well nourished in no acute distress.  Alert and oriented x3. HEENT: NCAT, EOMI Neck: Supple, trachea medial Cardiovascular: Irregular rate and rhythm with no rubs or gallops.  No thyromegaly or JVD noted.  No lower extremity edema. 2/4 pulses in all 4 extremities. Respiratory: Clear to auscultation with no wheezes or rales. Good inspiratory effort. Abdomen: Soft, nontender nondistended with normal bowel sounds x4 quadrants. Genitourinary: Right inguinal hernia with protrusion and tender to palpation Muskuloskeletal: No cyanosis, clubbing or edema noted bilaterally Neuro: CN II-XII intact, strength 5/5 x 4, sensation, reflexes intact Skin: No ulcerative lesions noted or rashes Psychiatry: Judgement and insight appear normal. Mood is appropriate for condition and setting          Labs on Admission:  Basic Metabolic Panel: Recent Labs  Lab 07/16/22 1420  NA 137  K 3.9  CL 103  CO2 26  GLUCOSE 129*  BUN 18  CREATININE 1.64*  CALCIUM 9.3   Liver Function Tests: Recent Labs  Lab 07/16/22 1420  AST 18  ALT 12  ALKPHOS 62  BILITOT 1.3*  PROT 8.0  ALBUMIN 4.5   No results for input(s): "LIPASE", "AMYLASE" in the last 168 hours. No results for input(s): "AMMONIA" in the last 168 hours. CBC: Recent Labs  Lab 07/16/22 1420  WBC 5.8  NEUTROABS 4.1  HGB 15.6  HCT 47.1  MCV 83.7  PLT 164   Cardiac Enzymes: No results for input(s): "CKTOTAL", "CKMB", "CKMBINDEX", "TROPONINI" in the last 168 hours.  BNP (last 3 results) No results for input(s): "BNP" in the last 8760 hours.  ProBNP (last 3 results) No results for input(s): "PROBNP" in the last 8760 hours.  CBG: No results for input(s): "GLUCAP" in the last 168 hours.  Radiological Exams on  Admission: CT Abdomen Pelvis W Contrast  Result Date: 07/16/2022 CLINICAL DATA:  Right inguinal swelling EXAM: CT ABDOMEN AND PELVIS WITH CONTRAST TECHNIQUE: Multidetector CT imaging of the abdomen and pelvis was performed using the standard protocol following bolus administration of intravenous contrast. RADIATION DOSE REDUCTION: This exam was performed according to the departmental dose-optimization program which includes automated exposure control, adjustment of the mA and/or kV according to patient size and/or use of iterative reconstruction technique. CONTRAST:  178m OMNIPAQUE IOHEXOL 300 MG/ML  SOLN COMPARISON:  06/17/2022 FINDINGS: Lower chest: Heart is enlarged in size. Coronary artery calcifications are seen. Minimal pericardial effusion is seen. Motion artifacts limit evaluation of lower lung fields. Small patchy infiltrates are noted in the posterior lower lung fields on both sides, more so on the left side. Hepatobiliary: No focal abnormalities are seen in liver. Gallbladder  is unremarkable. There is no dilation of bile ducts. Pancreas: No focal abnormalities are seen. Spleen: Spleen measures 13.1 cm in maximum diameter. Adrenals/Urinary Tract: There is 7 mm nodule in right adrenal. There is 1.4 cm nodule in left adrenal. There is hyperplasia of adrenals, more so on the left side. These findings appear stable. There is no hydronephrosis. There are no renal or ureteral stones. There are scattered calcifications in renal artery branches. There are multiple smooth marginated low-density lesions of varying sizes in both kidneys suggesting bilateral renal cysts. Largest of the lesions is in the lower pole of left kidney measuring 8.6 cm in diameter. These lesions appear stable. Urinary bladder is unremarkable. Stomach/Bowel: Small hiatal hernia is seen. There is mild wall thickening in lower thoracic esophagus. Stomach is otherwise unremarkable. There is dilation of distal small bowel loops measuring up  to 3.5 cm in diameter. There is mild diffuse wall thickening in small bowel loop in right lower quadrant close to the inguinal canal. Small bowel loop is seen extending into the right inguinal canal. There is moderate to large amount of fluid in the right inguinal canal. Fluid-fluid level is seen in the right inguinal canal. Findings suggest possible incarceration of small bowel loop with obstruction. Surgical consultation should be considered. Terminal ileum is not dilated. Appendix is not dilated. Scattered diverticula are seen in colon without signs of focal acute diverticulitis. Vascular/Lymphatic: Extensive arterial calcifications are seen in aorta and its major branches. Reproductive: Prostate is enlarged. Other: There is no ascites or pneumoperitoneum. Small umbilical hernia containing fat is seen. Musculoskeletal: Degenerative changes are noted in lumbar spine. IMPRESSION: Right inguinal hernia containing small bowel loop which appears to be incarcerated causing small-bowel obstruction. There is moderate to large amount of fluid in the right inguinal canal related to incarceration of a small bowel loop. There is mild diffuse wall thickening in the small bowel loop adjacent to the right inguinal hernia suggesting possible inflammation. Surgical consultation should be considered. There is no hydronephrosis.  Appendix is not dilated. Small patchy infiltrates are seen in both lower lung fields, more so on the left side suggesting subsegmental atelectasis or early pneumonia. Cardiomegaly. Coronary artery disease. Severe aortic atherosclerosis. Spleen is slightly enlarged in size. Small hiatal hernia. Bilateral renal cysts. 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 Diverticulosis of colon without signs of focal diverticulitis. Other findings as described in the body of the report. Imaging finding of incarcerated obstructing right inguinal hernia was relayed to patient's provider Dr. Unk Lightning by  telephone call. Electronically Signed   By: Elmer Picker M.D.   On: 07/16/2022 18:54    EKG: I independently viewed the EKG done and my findings are as followed: Atrial fibrillation with rate control  Assessment/Plan Present on Admission:  Small bowel obstruction ([place])  Essential hypertension, benign  Mixed hyperlipidemia  Alcohol abuse  Principal Problem:   Right inguinal hernia Active Problems:   Essential hypertension, benign   Mixed hyperlipidemia   Hx of atrial flutter   Alcohol abuse   Small bowel obstruction (HCC)   Chronic kidney disease, stage 3a (HCC)   GERD (gastroesophageal reflux disease)   Drug abuse (HCC)   Nausea & vomiting  Right inguinal hernia CT abdomen and pelvis with contrast showed right inguinal hernia containing small bowel loop which appears to be incarcerated.  Hernia was reduced after placing ice in the groin for a few minutes Continue n.p.o. Continue IV Dilaudid 0.5 mg every 4 hours as needed for moderate/severe  pain General surgery already consulted with plan to take patient to the OR in the morning  Small bowel obstruction Continue NG tube as recommended by general surgery Continue NPO at this time with plan to advanced diet as tolerated Continue IV hydration Continue IV Dilaudid 0.5 mg q.4h p.r.n. for moderate/severe pain Continue Zofran p.r.n. for nausea/vomiting General surgery was already consulted by ED team as indicated above  Nausea and vomiting Continue IV Zofran as needed  Essential hypertension Continue IV hydralazine 10 mg every 6 hours as needed for SBP > 170  Mixed hyperlipidemia Continue Pravachol  GERD Continue Protonix  History of atrial flutter EKG personally reviewed showed atrial fibrillation with rate controlled  Drug and alcohol abuse Patient admits to occasional use of cocaine Patient was counseled on alcohol and cocaine use cessation  DVT prophylaxis: SCDs  Code Status: Full code (confirmed at  bedside)  Family Communication: Wife at bedside (all questions answered to satisfaction)  Consults: General surgery  Severity of Illness: The appropriate patient status for this patient is INPATIENT. Inpatient status is judged to be reasonable and necessary in order to provide the required intensity of service to ensure the patient's safety. The patient's presenting symptoms, physical exam findings, and initial radiographic and laboratory data in the context of their chronic comorbidities is felt to place them at high risk for further clinical deterioration. Furthermore, it is not anticipated that the patient will be medically stable for discharge from the hospital within 2 midnights of admission.   * I certify that at the point of admission it is my clinical judgment that the patient will require inpatient hospital care spanning beyond 2 midnights from the point of admission due to high intensity of service, high risk for further deterioration and high frequency of surveillance required.*  Author: Bernadette Hoit, DO 07/17/2022 12:34 AM  For on call review [email or web address].

## 2022-07-16 NOTE — ED Notes (Signed)
Patient transported to CT 

## 2022-07-16 NOTE — ED Triage Notes (Signed)
Pt c/o hernia to right groin. Hx of this in past. Started this am with right testicle swelling. Denies any trouble urinating. N/v x 3  today. Lnbm yesterday. Nad. Color wnl. No obvious ss of pain noted in triage

## 2022-07-16 NOTE — ED Notes (Signed)
Ice applied to right groin 

## 2022-07-16 NOTE — ED Provider Notes (Signed)
[facility] EMERGENCY DEPARTMENT Provider Note   CSN: [id number] Arrival date & time: 07/16/22  1352     History  Chief Complaint  Patient presents with   Abdominal Pain    Timothy [name] is a 66 y.o. male.   Abdominal Pain   66 year old male presents emergency department with complaints of right inguinal hernia pain.  Patient has known right inguinal hernia for the past several months.  Patient states he is being followed outpatient by his primary care but no notes available.  Patient reports increasing pain this morning upon awakening.  Last bowel movement today nonbloody in appearance.  Denies abdominal pain, nausea, vomiting, urinary symptoms, change in bowel habits.,  Fever, chills, night sweats.   Past medical history significant for CKD, CHF, hypertension, hyperlipidemia, alcohol abuse, atrial flutter  Home Medications Prior to Admission medications   Medication Sig Start Date End Date Taking? Authorizing Provider  amLODipine (NORVASC) 10 MG tablet Take 1 tablet (10 mg total) by mouth daily for 30 days. 12/24/18 07/16/22 Yes Arrien, Jimmy Picket, MD  carvedilol (COREG) 25 MG tablet Take 1 tablet (25 mg total) by mouth 2 (two) times daily with a meal for 30 days. 12/23/18 07/16/22 Yes Arrien, Jimmy Picket, MD  furosemide (LASIX) 20 MG tablet Take 20 mg by mouth daily as needed for fluid. 02/06/22  Yes [provider]  hydrALAZINE (APRESOLINE) 50 MG tablet Take 1 tablet (50 mg total) by mouth 3 (three) times daily. 07/04/21 07/16/22 Yes O'Neal, Cassie Freer, MD  isosorbide dinitrate (ISORDIL) 20 MG tablet Take 20 mg by mouth 3 (three) times daily.   Yes [provider]  isosorbide mononitrate (IMDUR) 60 MG 24 hr tablet TAKE ONE TABLET BY MOUTH ONCE DAILY. 04/02/22  Yes Jerline Pain, MD  pravastatin (PRAVACHOL) 20 MG tablet Take 20 mg by mouth daily. 06/25/22  Yes [provider]  pantoprazole (PROTONIX) 20 MG tablet Take 1 tablet (20 mg total)  by mouth daily for 14 days. Patient not taking: Reported on 07/16/2022 06/17/22 07/01/22  Jeanell Sparrow, DO  traMADol (ULTRAM) 50 MG tablet Take 1 tablet (50 mg total) by mouth every 6 (six) hours as needed. Patient not taking: Reported on 07/16/2022 01/24/22   Fredia Sorrow, MD      Allergies    Patient has no known allergies.    Review of Systems   Review of Systems  Gastrointestinal:  Positive for abdominal pain.  All other systems reviewed and are negative.   Physical Exam Updated Vital Signs BP (!) 187/87   Pulse (!) 59   Temp 97.9 F (36.6 C)   Resp 15   Ht '5\' 7"'$  (1.702 m)   Wt 72.6 kg   SpO2 99%   BMI 25.06 kg/m  Physical Exam Vitals and nursing note reviewed.  Constitutional:      General: He is not in acute distress.    Appearance: He is well-developed.  HENT:     Head: Normocephalic and atraumatic.  Eyes:     Conjunctiva/sclera: Conjunctivae normal.  Cardiovascular:     Rate and Rhythm: Normal rate and regular rhythm.  Pulmonary:     Effort: Pulmonary effort is normal. No respiratory distress.     Breath sounds: Normal breath sounds.  Abdominal:     Palpations: Abdomen is soft.     Tenderness: There is no abdominal tenderness.     Comments: Firm right-sided inguinal mass mildly tender to palpation.  Area not reducible on physical exam.  No obvious redness or color changes of the overlying skin.  Musculoskeletal:        General: No swelling.     Cervical back: Neck supple.  Skin:    General: Skin is warm and dry.     Capillary Refill: Capillary refill takes less than 2 seconds.  Neurological:     Mental Status: He is alert.  Psychiatric:        Mood and Affect: Mood normal.     ED Results / Procedures / Treatments   Labs (all labs ordered are listed, but only abnormal results are displayed) Labs Reviewed  COMPREHENSIVE METABOLIC PANEL - Abnormal; Notable for the following components:      Result Value   Glucose, Bld 129 (*)    Creatinine,  Ser 1.64 (*)    Total Bilirubin 1.3 (*)    GFR, Estimated 45 (*)    All other components within normal limits  CBC WITH DIFFERENTIAL/PLATELET  LACTIC ACID, PLASMA    EKG EKG Interpretation  Date/Time:  Thursday July 16 2022 19:45:55 EST Ventricular Rate:  60 PR Interval:    QRS Duration: 107 QT Interval:  477 QTC Calculation: 477 R Axis:   -4 Text Interpretation: Atrial flutter Inferior infarct, age indeterminate Consider anterior infarct Lateral leads are also involved Baseline wander in lead(s) V2   Confirmed by Isla Pence [phone]) on 07/16/2022 9:45:55 PM  Radiology CT Abdomen Pelvis W Contrast  Result Date: 07/16/2022 CLINICAL DATA:  Right inguinal swelling EXAM: CT ABDOMEN AND PELVIS WITH CONTRAST TECHNIQUE: Multidetector CT imaging of the abdomen and pelvis was performed using the standard protocol following bolus administration of intravenous contrast. RADIATION DOSE REDUCTION: This exam was performed according to the departmental dose-optimization program which includes automated exposure control, adjustment of the mA and/or kV according to patient size and/or use of iterative reconstruction technique. CONTRAST:  171m OMNIPAQUE IOHEXOL 300 MG/ML  SOLN COMPARISON:  06/17/2022 FINDINGS: Lower chest: Heart is enlarged in size. Coronary artery calcifications are seen. Minimal pericardial effusion is seen. Motion artifacts limit evaluation of lower lung fields. Small patchy infiltrates are noted in the posterior lower lung fields on both sides, more so on the left side. Hepatobiliary: No focal abnormalities are seen in liver. Gallbladder is unremarkable. There is no dilation of bile ducts. Pancreas: No focal abnormalities are seen. Spleen: Spleen measures 13.1 cm in maximum diameter. Adrenals/Urinary Tract: There is 7 mm nodule in right adrenal. There is 1.4 cm nodule in left adrenal. There is hyperplasia of adrenals, more so on the left side. These findings appear stable. There  is no hydronephrosis. There are no renal or ureteral stones. There are scattered calcifications in renal artery branches. There are multiple smooth marginated low-density lesions of varying sizes in both kidneys suggesting bilateral renal cysts. Largest of the lesions is in the lower pole of left kidney measuring 8.6 cm in diameter. These lesions appear stable. Urinary bladder is unremarkable. Stomach/Bowel: Small hiatal hernia is seen. There is mild wall thickening in lower thoracic esophagus. Stomach is otherwise unremarkable. There is dilation of distal small bowel loops measuring up to 3.5 cm in diameter. There is mild diffuse wall thickening in small bowel loop in right lower quadrant close to the inguinal canal. Small bowel loop is seen extending into the right inguinal canal. There is moderate to large amount of fluid in the right inguinal canal. Fluid-fluid level is seen in the right inguinal canal. Findings suggest possible incarceration of small bowel loop with obstruction. Surgical  consultation should be considered. Terminal ileum is not dilated. Appendix is not dilated. Scattered diverticula are seen in colon without signs of focal acute diverticulitis. Vascular/Lymphatic: Extensive arterial calcifications are seen in aorta and its major branches. Reproductive: Prostate is enlarged. Other: There is no ascites or pneumoperitoneum. Small umbilical hernia containing fat is seen. Musculoskeletal: Degenerative changes are noted in lumbar spine. IMPRESSION: Right inguinal hernia containing small bowel loop which appears to be incarcerated causing small-bowel obstruction. There is moderate to large amount of fluid in the right inguinal canal related to incarceration of a small bowel loop. There is mild diffuse wall thickening in the small bowel loop adjacent to the right inguinal hernia suggesting possible inflammation. Surgical consultation should be considered. There is no hydronephrosis.  Appendix is not  dilated. Small patchy infiltrates are seen in both lower lung fields, more so on the left side suggesting subsegmental atelectasis or early pneumonia. Cardiomegaly. Coronary artery disease. Severe aortic atherosclerosis. Spleen is slightly enlarged in size. Small hiatal hernia. Bilateral renal cysts. 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 Diverticulosis of colon without signs of focal diverticulitis. Other findings as described in the body of the report. Imaging finding of incarcerated obstructing right inguinal hernia was relayed to patient's provider Dr. Unk Lightning by telephone call. Electronically Signed   By: Elmer Picker M.D.   On: 07/16/2022 18:54    Procedures .Critical Care  Performed by: Wilnette Kales, PA Authorized by: Wilnette Kales, PA   Critical care provider statement:    Critical care time (minutes):  45   Critical care was necessary to treat or prevent imminent or life-threatening deterioration of the following conditions: Incarcerated hernia.   Critical care was time spent personally by me on the following activities:  Development of treatment plan with patient or surrogate, discussions with consultants, evaluation of patient's response to treatment, examination of patient, ordering and review of laboratory studies, ordering and review of radiographic studies, ordering and performing treatments and interventions, pulse oximetry, re-evaluation of patient's condition and review of old charts   I assumed direction of critical care for this patient from another provider in my specialty: no     Care discussed with: admitting provider   Hernia reduction  Date/Time: 07/16/2022 10:22 PM  Performed by: Wilnette Kales, PA Authorized by: Wilnette Kales, PA  Consent: Verbal consent obtained. Risks and benefits: risks, benefits and alternatives were discussed Consent given by: patient Patient understanding: patient states understanding of the procedure  being performed Patient consent: the patient's understanding of the procedure matches consent given Procedure consent: procedure consent matches procedure scheduled Local anesthesia used: no  Anesthesia: Local anesthesia used: no  Sedation: Patient sedated: no        Medications Ordered in ED Medications  morphine (PF) 4 MG/ML injection 4 mg (0 mg Intravenous Hold 07/16/22 1946)  carvedilol (COREG) tablet 25 mg (has no administration in time range)  morphine (PF) 4 MG/ML injection 4 mg (4 mg Intravenous Given 07/16/22 1607)  iohexol (OMNIPAQUE) 300 MG/ML solution 100 mL (100 mLs Intravenous Contrast Given 07/16/22 1817)    ED Course/ Medical Decision Making/ A&P Clinical Course as of 07/16/22 2226  Thu Jul 16, 2022  1844 Radiologist report: Incarcerated right inguinal hernia with bowel obstruction and dilation of proximal bowel with wall thickening. [CR]  29 Consulted general surgery Dr. Constance Haw regarding the patient.  Imaging to be reviewed pending more details of disposition. [CR]  2036 Conversation was had again with Dr. Constance Haw  after reduction of hernia. Admission still advised and given CT findings.  Hospitalist to be consulted [CR]    Clinical Course User Index [CR] Wilnette Kales, PA                           Medical Decision Making Amount and/or Complexity of Data Reviewed Labs: ordered. Radiology: ordered.  Risk Prescription drug management. Decision regarding hospitalization.   This patient presents to the ED for concern of right inguinal mass, this involves an extensive number of treatment options, and is a complaint that carries with it a high risk of complications and morbidity.  The differential diagnosis includes incarcerated   Co morbidities that complicate the patient evaluation  See HPI   Additional history obtained:  Additional history obtained from EMR External records from outside source obtained and reviewed including hospital  records   Lab Tests:  I Ordered, and personally interpreted labs.  The pertinent results include: No leukocytosis noted.  No evidence anemia.  Platelets within normal range.  No electrolyte abnormalities noted.  Patient with baseline renal dysfunction.  Patient with GFR 45, creatinine 1.64 and BUN of 18.  No transaminitis noted.  Initial lactic acid of 0.9.   Imaging Studies ordered:  I ordered imaging studies including CT abdomen pelvis I independently visualized and interpreted imaging which showed right inguinal hernia with small bowel loop.  Moderate amount of fluid in right canal.  Diffuse small bowel wall thickening. I agree with the radiologist interpretation   Cardiac Monitoring: / EKG:  The patient was maintained on a cardiac monitor.  I personally viewed and interpreted the cardiac monitored which showed an underlying rhythm of: Sinus rhythm   Consultations Obtained:  See ED course  Problem List / ED Course / Critical interventions / Medication management  Incarcerated hernia I ordered medication including morphine for pain.  Coreg for continuation of at home antihypertensives.    Reevaluation of the patient after these medicines showed that the patient improved I have reviewed the patients home medicines and have made adjustments as needed   Social Determinants of Health:  Former cigarette use.  Denies illicit drug use.   Test / Admission - Considered:  Incarcerated hernia Vitals signs significant for persistent hypertension with a blood pressure greater than 140/80 throughout visit; patient without home antihypertensives today of which were begun upon patient's admission.. Otherwise within normal range and stable throughout visit. Laboratory/imaging studies significant for: See above Patient deemed to meet admission criteria given evidence of incarcerated hernia with obstruction as indicated on CT scan.  Patient with semi-successful hernia reduction of which  reduces with minimal pressure but extrudes when released; patient showed no evidence of tenderness after pain medication, ice applied and reduction was obtained.  General surgery was consulted for the patient who agreed with admission for surgical intervention in the morning. Hospital medicine was consulted regarding the patient who agreed with admission of the patient and assume further treatment/care.   Treatment plan were discussed at length with patient and they knowledge understanding was agreeable to said plan.  Appropriate consultations were made as described in the ED course.  Patient was stable upon admission to the hospital.         Final Clinical Impression(s) / ED Diagnoses Final diagnoses:  Incarcerated hernia    Rx / DC Orders ED Discharge Orders     None         Wilnette Kales, [place] 07/16/22 2226  Teressa Lower, MD 07/31/22 [phone]

## 2022-07-16 NOTE — Progress Notes (Signed)
[facility]  Reviewed CT. SBO with right inguinal hernia, some fluid in the sac. Patient reports having this incarcerated for a few days. Had a BM today. Bowel a little thickened but no pneumatosis, no free air. No leokocytosis. He is very hypertensive.   Lactic acid added for now. NPO, NG to be placed now Will get ED to see if they can reduce the hernia again after NG and pain meds   Will follow up. Hospitalist admission Surgery tomorrow pending reduction in ED.  Curlene Labrum, MD [facility] [address] [place], [place] [address] [phone] (office)

## 2022-07-17 ENCOUNTER — Encounter (HOSPITAL_COMMUNITY): Payer: Self-pay | Admitting: Internal Medicine

## 2022-07-17 ENCOUNTER — Inpatient Hospital Stay (HOSPITAL_COMMUNITY): Payer: Medicare Other | Admitting: Certified Registered Nurse Anesthetist

## 2022-07-17 ENCOUNTER — Encounter (HOSPITAL_COMMUNITY)
Admission: EM | Disposition: A | Discharge status: Home / Self Care | Payer: Self-pay | Source: Home / Self Care | Attending: Internal Medicine

## 2022-07-17 ENCOUNTER — Other Ambulatory Visit: Payer: Self-pay

## 2022-07-17 DIAGNOSIS — K219 Gastro-esophageal reflux disease without esophagitis: Secondary | ICD-10-CM | POA: Insufficient documentation

## 2022-07-17 DIAGNOSIS — K4031 Unilateral inguinal hernia, with obstruction, without gangrene, recurrent: Secondary | ICD-10-CM

## 2022-07-17 DIAGNOSIS — R112 Nausea with vomiting, unspecified: Secondary | ICD-10-CM | POA: Insufficient documentation

## 2022-07-17 DIAGNOSIS — I130 Hypertensive heart and chronic kidney disease with heart failure and stage 1 through stage 4 chronic kidney disease, or unspecified chronic kidney disease: Secondary | ICD-10-CM

## 2022-07-17 DIAGNOSIS — K4030 Unilateral inguinal hernia, with obstruction, without gangrene, not specified as recurrent: Secondary | ICD-10-CM

## 2022-07-17 DIAGNOSIS — K4090 Unilateral inguinal hernia, without obstruction or gangrene, not specified as recurrent: Secondary | ICD-10-CM

## 2022-07-17 DIAGNOSIS — N1831 Chronic kidney disease, stage 3a: Secondary | ICD-10-CM | POA: Insufficient documentation

## 2022-07-17 DIAGNOSIS — K460 Unspecified abdominal hernia with obstruction, without gangrene: Principal | ICD-10-CM

## 2022-07-17 DIAGNOSIS — F1910 Other psychoactive substance abuse, uncomplicated: Secondary | ICD-10-CM | POA: Insufficient documentation

## 2022-07-17 DIAGNOSIS — Z87891 Personal history of nicotine dependence: Secondary | ICD-10-CM

## 2022-07-17 DIAGNOSIS — I5022 Chronic systolic (congestive) heart failure: Secondary | ICD-10-CM

## 2022-07-17 HISTORY — PX: INGUINAL HERNIA REPAIR: SHX194

## 2022-07-17 LAB — CBC
HCT: 42.2 % (ref 39.0–52.0)
Hemoglobin: 13.8 g/dL (ref 13.0–17.0)
MCH: 27.5 pg (ref 26.0–34.0)
MCHC: 32.7 g/dL (ref 30.0–36.0)
MCV: 84.2 fL (ref 80.0–100.0)
Platelets: 131 10*3/uL — ABNORMAL LOW (ref 150–400)
RBC: 5.01 MIL/uL (ref 4.22–5.81)
RDW: 13.8 % (ref 11.5–15.5)
WBC: 4.9 10*3/uL (ref 4.0–10.5)
nRBC: 0 % (ref 0.0–0.2)

## 2022-07-17 LAB — BASIC METABOLIC PANEL
Anion gap: 8 (ref 5–15)
BUN: 15 mg/dL (ref 8–23)
CO2: 24 mmol/L (ref 22–32)
Calcium: 8.8 mg/dL — ABNORMAL LOW (ref 8.9–10.3)
Chloride: 107 mmol/L (ref 98–111)
Creatinine, Ser: 1.41 mg/dL — ABNORMAL HIGH (ref 0.61–1.24)
GFR, Estimated: 54 mL/min — ABNORMAL LOW (ref >60)
Glucose, Bld: 85 mg/dL (ref 70–99)
Potassium: 3.3 mmol/L — ABNORMAL LOW (ref 3.5–5.1)
Sodium: 139 mmol/L (ref 135–145)

## 2022-07-17 LAB — HIV ANTIBODY (ROUTINE TESTING W REFLEX): HIV Screen 4th Generation wRfx: NONREACTIVE

## 2022-07-17 LAB — PHOSPHORUS: Phosphorus: 2.9 mg/dL (ref 2.5–4.6)

## 2022-07-17 LAB — MAGNESIUM: Magnesium: 2 mg/dL (ref 1.7–2.4)

## 2022-07-17 SURGERY — REPAIR, HERNIA, INGUINAL, INCARCERATED
Anesthesia: General | Site: Groin | Laterality: Right

## 2022-07-17 MED ORDER — MORPHINE SULFATE (PF) 2 MG/ML IV SOLN: 2.0000 mg | INTRAVENOUS | Status: DC | PRN

## 2022-07-17 MED ORDER — ONDANSETRON HCL 4 MG/2ML IJ SOLN
INTRAMUSCULAR | Status: AC
  Filled 2022-07-17: qty 2

## 2022-07-17 MED ORDER — LACTATED RINGERS IV SOLN: INTRAVENOUS | Status: DC

## 2022-07-17 MED ORDER — LIDOCAINE HCL (PF) 2 % IJ SOLN
INTRAMUSCULAR | Status: AC
  Filled 2022-07-17: qty 5

## 2022-07-17 MED ORDER — SODIUM CHLORIDE 0.9 % IV SOLN
INTRAVENOUS | Status: DC | PRN
  Administered 2022-07-17: 2 g via INTRAVENOUS

## 2022-07-17 MED ORDER — LIDOCAINE 2% (20 MG/ML) 5 ML SYRINGE
INTRAMUSCULAR | Status: DC | PRN
  Administered 2022-07-17: 60 mg via INTRAVENOUS

## 2022-07-17 MED ORDER — CHLORHEXIDINE GLUCONATE CLOTH 2 % EX PADS: 6.0000 | MEDICATED_PAD | Freq: Once | CUTANEOUS | Status: DC

## 2022-07-17 MED ORDER — SODIUM CHLORIDE 0.9 % IV SOLN
2.0000 g | INTRAVENOUS | Status: DC
  Filled 2022-07-17: qty 2

## 2022-07-17 MED ORDER — OXYCODONE HCL 5 MG PO TABS: 5.0000 mg | ORAL_TABLET | ORAL | Status: DC | PRN

## 2022-07-17 MED ORDER — ONDANSETRON HCL 4 MG/2ML IJ SOLN: 4.0000 mg | Freq: Once | INTRAMUSCULAR | Status: DC | PRN

## 2022-07-17 MED ORDER — POTASSIUM CHLORIDE IN NACL 40-0.9 MEQ/L-% IV SOLN: INTRAVENOUS | Status: DC

## 2022-07-17 MED ORDER — BUPIVACAINE HCL (300 MG DOSE) 3 X 100 MG IL IMPL
DRUG_IMPLANT | Status: DC | PRN
  Administered 2022-07-17: 300 mg

## 2022-07-17 MED ORDER — PRAVASTATIN SODIUM 10 MG PO TABS
20.0000 mg | ORAL_TABLET | Freq: Every evening | ORAL | Status: DC
  Administered 2022-07-17: 20 mg via ORAL
  Filled 2022-07-17: qty 2

## 2022-07-17 MED ORDER — ONDANSETRON HCL 4 MG/2ML IJ SOLN: 4.0000 mg | Freq: Four times a day (QID) | INTRAMUSCULAR | Status: DC | PRN

## 2022-07-17 MED ORDER — BUPIVACAINE HCL (300 MG DOSE) 3 X 100 MG IL IMPL
DRUG_IMPLANT | Status: AC
  Filled 2022-07-17: qty 100

## 2022-07-17 MED ORDER — PHENYLEPHRINE 80 MCG/ML (10ML) SYRINGE FOR IV PUSH (FOR BLOOD PRESSURE SUPPORT)
PREFILLED_SYRINGE | INTRAVENOUS | Status: AC
  Filled 2022-07-17: qty 10

## 2022-07-17 MED ORDER — SUGAMMADEX SODIUM 200 MG/2ML IV SOLN
INTRAVENOUS | Status: DC | PRN
  Administered 2022-07-17: 147.2 mg via INTRAVENOUS

## 2022-07-17 MED ORDER — EPHEDRINE SULFATE (PRESSORS) 50 MG/ML IJ SOLN
INTRAMUSCULAR | Status: DC | PRN
  Administered 2022-07-17 (×2): 5 mg via INTRAVENOUS

## 2022-07-17 MED ORDER — PROPOFOL 10 MG/ML IV BOLUS
INTRAVENOUS | Status: AC
  Filled 2022-07-17: qty 20

## 2022-07-17 MED ORDER — FENTANYL CITRATE (PF) 250 MCG/5ML IJ SOLN
INTRAMUSCULAR | Status: AC
  Filled 2022-07-17: qty 5

## 2022-07-17 MED ORDER — METOPROLOL TARTRATE 5 MG/5ML IV SOLN: 5.0000 mg | Freq: Four times a day (QID) | INTRAVENOUS | Status: DC | PRN

## 2022-07-17 MED ORDER — DEXAMETHASONE SODIUM PHOSPHATE 10 MG/ML IJ SOLN
INTRAMUSCULAR | Status: DC | PRN
  Administered 2022-07-17: 10 mg via INTRAVENOUS

## 2022-07-17 MED ORDER — ONDANSETRON HCL 4 MG PO TABS: 4.0000 mg | ORAL_TABLET | Freq: Four times a day (QID) | ORAL | Status: DC | PRN

## 2022-07-17 MED ORDER — ROCURONIUM BROMIDE 10 MG/ML (PF) SYRINGE
PREFILLED_SYRINGE | INTRAVENOUS | Status: DC | PRN
  Administered 2022-07-17: 70 mg via INTRAVENOUS

## 2022-07-17 MED ORDER — FENTANYL CITRATE PF 50 MCG/ML IJ SOSY: 25.0000 ug | PREFILLED_SYRINGE | INTRAMUSCULAR | Status: DC | PRN

## 2022-07-17 MED ORDER — FENTANYL CITRATE (PF) 250 MCG/5ML IJ SOLN
INTRAMUSCULAR | Status: DC | PRN
  Administered 2022-07-17 (×2): 50 ug via INTRAVENOUS

## 2022-07-17 MED ORDER — CHLORHEXIDINE GLUCONATE 0.12 % MT SOLN
OROMUCOSAL | Status: AC
  Administered 2022-07-17: 15 mL via OROMUCOSAL
  Filled 2022-07-17: qty 15

## 2022-07-17 MED ORDER — ORAL CARE MOUTH RINSE: 15.0000 mL | Freq: Once | OROMUCOSAL | Status: AC

## 2022-07-17 MED ORDER — CHLORHEXIDINE GLUCONATE 0.12 % MT SOLN: 15.0000 mL | Freq: Once | OROMUCOSAL | Status: AC

## 2022-07-17 MED ORDER — CHLORHEXIDINE GLUCONATE CLOTH 2 % EX PADS
6.0000 | MEDICATED_PAD | Freq: Once | CUTANEOUS | Status: DC
  Administered 2022-07-17: 6 via TOPICAL

## 2022-07-17 MED ORDER — DOCUSATE SODIUM 100 MG PO CAPS
100.0000 mg | ORAL_CAPSULE | Freq: Two times a day (BID) | ORAL | Status: DC
  Administered 2022-07-17 – 2022-07-18 (×3): 100 mg via ORAL
  Filled 2022-07-17 (×3): qty 1

## 2022-07-17 MED ORDER — SODIUM CHLORIDE 0.9 % IR SOLN
Status: DC | PRN
  Administered 2022-07-17: 1000 mL

## 2022-07-17 MED ORDER — PROPOFOL 10 MG/ML IV BOLUS
INTRAVENOUS | Status: DC | PRN
  Administered 2022-07-17: 150 mg via INTRAVENOUS

## 2022-07-17 MED ORDER — ONDANSETRON HCL 4 MG/2ML IJ SOLN
INTRAMUSCULAR | Status: DC | PRN
  Administered 2022-07-17: 4 mg via INTRAVENOUS

## 2022-07-17 MED ORDER — PHENYLEPHRINE HCL (PRESSORS) 10 MG/ML IV SOLN
INTRAVENOUS | Status: DC | PRN
  Administered 2022-07-17: 40 ug via INTRAVENOUS
  Administered 2022-07-17 (×2): 80 ug via INTRAVENOUS

## 2022-07-17 MED ORDER — ROCURONIUM BROMIDE 10 MG/ML (PF) SYRINGE
PREFILLED_SYRINGE | INTRAVENOUS | Status: AC
  Filled 2022-07-17: qty 10

## 2022-07-17 MED ORDER — DEXAMETHASONE SODIUM PHOSPHATE 10 MG/ML IJ SOLN
INTRAMUSCULAR | Status: AC
  Filled 2022-07-17: qty 1

## 2022-07-17 SURGICAL SUPPLY — 42 items
ADH SKN CLS APL DERMABOND .7 (GAUZE/BANDAGES/DRESSINGS) ×1
CLOTH BEACON ORANGE TIMEOUT ST (SAFETY) ×2 IMPLANT
COVER LIGHT HANDLE STERIS (MISCELLANEOUS) ×4 IMPLANT
DERMABOND ADVANCED .7 DNX12 (GAUZE/BANDAGES/DRESSINGS) ×2 IMPLANT
DRAIN PENROSE 0.5X18 (DRAIN) ×2 IMPLANT
ELECT REM PT RETURN 9FT ADLT (ELECTROSURGICAL) ×1
ELECTRODE REM PT RTRN 9FT ADLT (ELECTROSURGICAL) ×2 IMPLANT
GAUZE SPONGE 4X4 12PLY STRL (GAUZE/BANDAGES/DRESSINGS) ×2 IMPLANT
GLOVE BIO SURGEON STRL SZ 6 (GLOVE) IMPLANT
GLOVE BIO SURGEON STRL SZ 6.5 (GLOVE) ×2 IMPLANT
GLOVE BIO SURGEON STRL SZ7 (GLOVE) IMPLANT
GLOVE BIOGEL PI IND STRL 6.5 (GLOVE) ×2 IMPLANT
GLOVE BIOGEL PI IND STRL 7.0 (GLOVE) ×4 IMPLANT
GLOVE INDICATOR 7.5 STRL GRN (GLOVE) IMPLANT
GOWN STRL REUS W/TWL LRG LVL3 (GOWN DISPOSABLE) ×6 IMPLANT
INST SET MINOR GENERAL (KITS) ×2 IMPLANT
KIT TURNOVER KIT A (KITS) ×2 IMPLANT
MANIFOLD NEPTUNE II (INSTRUMENTS) ×2 IMPLANT
MESH HERNIA 1.6X1.9 PLUG LRG (Mesh General) IMPLANT
NDL HYPO 18GX1.5 BLUNT FILL (NEEDLE) ×2 IMPLANT
NDL HYPO 21X1.5 SAFETY (NEEDLE) IMPLANT
NEEDLE HYPO 18GX1.5 BLUNT FILL (NEEDLE) IMPLANT
NEEDLE HYPO 21X1.5 SAFETY (NEEDLE) IMPLANT
NS IRRIG 1000ML POUR BTL (IV SOLUTION) ×2 IMPLANT
PACK MINOR (CUSTOM PROCEDURE TRAY) ×2 IMPLANT
PAD ARMBOARD 7.5X6 YLW CONV (MISCELLANEOUS) ×2 IMPLANT
SET BASIN LINEN APH (SET/KITS/TRAYS/PACK) ×2 IMPLANT
SOL PREP PROV IODINE SCRUB 4OZ (MISCELLANEOUS) ×2 IMPLANT
SPONGE T-LAP 18X18 ~~LOC~~+RFID (SPONGE) IMPLANT
STAPLER VISISTAT (STAPLE) IMPLANT
SUT MNCRL AB 4-0 PS2 18 (SUTURE) ×2 IMPLANT
SUT NOVA NAB GS-22 2 2-0 T-19 (SUTURE) IMPLANT
SUT PDS AB 0 CTX 60 (SUTURE) IMPLANT
SUT SILK 3 0 (SUTURE)
SUT SILK 3 0 SH CR/8 (SUTURE) IMPLANT
SUT SILK 3-0 18XBRD TIE 12 (SUTURE) IMPLANT
SUT VIC AB 2-0 CT1 27 (SUTURE) ×2
SUT VIC AB 2-0 CT1 TAPERPNT 27 (SUTURE) ×2 IMPLANT
SUT VIC AB 3-0 SH 27 (SUTURE) ×1
SUT VIC AB 3-0 SH 27X BRD (SUTURE) ×2 IMPLANT
SUT VICRYL AB 3 0 TIES (SUTURE) IMPLANT
SYR 30ML LL (SYRINGE) ×2 IMPLANT

## 2022-07-17 NOTE — TOC Progression Note (Signed)
Transition of Care [facility]) - Progression Note    Patient Details  Name: RICHEY DOOLITTLE MRN: [id number] Date of Birth: [birth year]  Transition of Care [facility]) CM/SW Contact  Salome Arnt, [place] Phone Number: 07/17/2022, 7:50 AM  Clinical Narrative:  Transition of Care (TOC) Screening Note   Patient Details  Name: OCTAVE MONTROSE Date of Birth: 04-20-1956   Transition of [facility]) CM/SW Contact:    Salome Arnt, [place] Phone Number: 07/17/2022, 7:50 AM    Transition of Care Department [facility]) has reviewed patient and no TOC needs have been identified at this time. We will continue to monitor patient advancement through interdisciplinary progression rounds. If new patient transition needs arise, please place a TOC consult.             Expected Discharge Plan and Services                                                 Social Determinants of Health (SDOH) Interventions    Readmission Risk Interventions     No data to display

## 2022-07-17 NOTE — Progress Notes (Addendum)
[facility] PROGRESS NOTE    Progress Note  Timothy [name]  JZP:[id number] DOB: 05/23/1956 DOA: 07/16/2022 PCP: Yves Dill, NP     Brief Narrative:   Timothy [name] is an 66 y.o. male past medical history significant for essential hypertension right inguinal hernia presents to the ED due to right inguinal hernia pain associated with nausea and vomiting the scan of the abdomen and pelvis showed a right inguinal hernia containing small bowel which appears to be incarcerated General surgery was consulted recommended open reduction of the hernia n.p.o.   Assessment/Plan:   Right inguinal hernia CT abdomen and pelvis showed incarcerated hernia,  mild diffuse wall thickening in the small bowel loop adjacent to the right inguinal hernia suggesting possible inflammation. Patient was placed n.p.o. started on IV narcotics General surgery was consulted . On physical exam this morning hernia is palpable but is not tender is not incarcerated it has reduced spontaneously.  Small bowel obstruction: NG tube has been placed to intermittent suction. Patient n.p.o. on IV fluids IV Dilaudid and Zofran. Appears to be resolved he is passing gas.  Essential hypertension, benign Blood pressure stable continue IV hydralazine as needed. Blood pressure is controlled continue Coreg.   Chronic kidney disease stage IIIa: Appears to be at baseline  GERD: Continue Protonix.  History of atypical flutter: With a heart rate less than 100 currently rate controlled Continue Coreg metoprolol as needed for heart rate greater than 110. Hold Eliquis for possible surgical intervention.  Mixed hyperlipidemia: Noted at home on statin holding due to n.p.o.  GERD: Continue Protonix.  Drug and alcohol abuse: Admits to occasional alcohol and cocaine use. Noted.   DVT prophylaxis: lovenox Family Communication:none Status is: Inpatient Remains inpatient appropriate because:  Abdominal pain    Code Status:     Code Status Orders  (From admission, onward)           Start     Ordered   07/17/22 0009  Full code  Continuous       Question:  By:  Answer:  Consent: discussion documented in EHR   07/17/22 0011           Code Status History     Date Active Date Inactive Code Status Order ID Comments User Context   12/21/2018 1239 12/23/2018 1943 Full Code [id number]  Arrien, Jimmy Picket, MD Inpatient         IV Access:   Peripheral IV   Procedures and diagnostic studies:   CT Abdomen Pelvis W Contrast  Result Date: 07/16/2022 CLINICAL DATA:  Right inguinal swelling EXAM: CT ABDOMEN AND PELVIS WITH CONTRAST TECHNIQUE: Multidetector CT imaging of the abdomen and pelvis was performed using the standard protocol following bolus administration of intravenous contrast. RADIATION DOSE REDUCTION: This exam was performed according to the departmental dose-optimization program which includes automated exposure control, adjustment of the mA and/or kV according to patient size and/or use of iterative reconstruction technique. CONTRAST:  177m OMNIPAQUE IOHEXOL 300 MG/ML  SOLN COMPARISON:  06/17/2022 FINDINGS: Lower chest: Heart is enlarged in size. Coronary artery calcifications are seen. Minimal pericardial effusion is seen. Motion artifacts limit evaluation of lower lung fields. Small patchy infiltrates are noted in the posterior lower lung fields on both sides, more so on the left side. Hepatobiliary: No focal abnormalities are seen in liver. Gallbladder is unremarkable. There is no dilation of bile ducts. Pancreas: No focal abnormalities are seen. Spleen: Spleen measures 13.1 cm in maximum diameter. Adrenals/Urinary Tract: There  is 7 mm nodule in right adrenal. There is 1.4 cm nodule in left adrenal. There is hyperplasia of adrenals, more so on the left side. These findings appear stable. There is no hydronephrosis. There are no renal or ureteral stones.  There are scattered calcifications in renal artery branches. There are multiple smooth marginated low-density lesions of varying sizes in both kidneys suggesting bilateral renal cysts. Largest of the lesions is in the lower pole of left kidney measuring 8.6 cm in diameter. These lesions appear stable. Urinary bladder is unremarkable. Stomach/Bowel: Small hiatal hernia is seen. There is mild wall thickening in lower thoracic esophagus. Stomach is otherwise unremarkable. There is dilation of distal small bowel loops measuring up to 3.5 cm in diameter. There is mild diffuse wall thickening in small bowel loop in right lower quadrant close to the inguinal canal. Small bowel loop is seen extending into the right inguinal canal. There is moderate to large amount of fluid in the right inguinal canal. Fluid-fluid level is seen in the right inguinal canal. Findings suggest possible incarceration of small bowel loop with obstruction. Surgical consultation should be considered. Terminal ileum is not dilated. Appendix is not dilated. Scattered diverticula are seen in colon without signs of focal acute diverticulitis. Vascular/Lymphatic: Extensive arterial calcifications are seen in aorta and its major branches. Reproductive: Prostate is enlarged. Other: There is no ascites or pneumoperitoneum. Small umbilical hernia containing fat is seen. Musculoskeletal: Degenerative changes are noted in lumbar spine. IMPRESSION: Right inguinal hernia containing small bowel loop which appears to be incarcerated causing small-bowel obstruction. There is moderate to large amount of fluid in the right inguinal canal related to incarceration of a small bowel loop. There is mild diffuse wall thickening in the small bowel loop adjacent to the right inguinal hernia suggesting possible inflammation. Surgical consultation should be considered. There is no hydronephrosis.  Appendix is not dilated. Small patchy infiltrates are seen in both lower lung  fields, more so on the left side suggesting subsegmental atelectasis or early pneumonia. Cardiomegaly. Coronary artery disease. Severe aortic atherosclerosis. Spleen is slightly enlarged in size. Small hiatal hernia. Bilateral renal cysts. 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 Diverticulosis of colon without signs of focal diverticulitis. Other findings as described in the body of the report. Imaging finding of incarcerated obstructing right inguinal hernia was relayed to patient's provider Dr. Unk Lightning by telephone call. Electronically Signed   By: Elmer Picker M.D.   On: 07/16/2022 18:54     Medical Consultants:   None.   Subjective:    Beulah Gandy denies any abdominal pain passing gas relates he is hungry this morning.  Objective:    Vitals:   07/16/22 2230 07/16/22 2300 07/17/22 0017 07/17/22 0330  BP: (!) 159/92 (!) 145/91 (!) 155/86 118/76  Pulse: (!) 56 (!) 59 69 (!) 50  Resp: '18 19 18 18  '$ Temp:   98.2 F (36.8 C) 98.2 F (36.8 C)  TempSrc:      SpO2: 98% 94% 100% 99%  Weight:   73.6 kg   Height:   '5\' 7"'$  (1.702 m)    SpO2: 99 %   Intake/Output Summary (Last 24 hours) at 07/17/2022 0720 Last data filed at 07/17/2022 0500 Gross per 24 hour  Intake 161.18 ml  Output --  Net 161.18 ml   Filed Weights   07/16/22 1657 07/17/22 0017  Weight: 72.6 kg 73.6 kg    Exam: General exam: In no acute distress. Respiratory system: Good air movement  and clear to auscultation. Cardiovascular system: S1 & S2 heard, RRR. No JVd. Gastrointestinal system: Abdomen is nondistended, soft and nontender.  Extremities: No pedal edema. Skin: No rashes, lesions or ulcers Psychiatry: Judgement and insight appear normal. Mood & affect appropriate.    Data Reviewed:    Labs: Basic Metabolic Panel: Recent Labs  Lab 07/16/22 1420 07/17/22 0325  NA 137 139  K 3.9 3.3*  CL 103 107  CO2 26 24  GLUCOSE 129* 85  BUN 18 15  CREATININE 1.64* 1.41*   CALCIUM 9.3 8.8*  MG  --  2.0  PHOS  --  2.9   GFR Estimated Creatinine Clearance: 46.9 mL/min (A) (by C-G formula based on SCr of 1.41 mg/dL (H)). Liver Function Tests: Recent Labs  Lab 07/16/22 1420  AST 18  ALT 12  ALKPHOS 62  BILITOT 1.3*  PROT 8.0  ALBUMIN 4.5   No results for input(s): "LIPASE", "AMYLASE" in the last 168 hours. No results for input(s): "AMMONIA" in the last 168 hours. Coagulation profile No results for input(s): "INR", "PROTIME" in the last 168 hours. COVID-19 Labs  No results for input(s): "DDIMER", "FERRITIN", "LDH", "CRP" in the last 72 hours.  Lab Results  Component Value Date   SARSCOV2NAA NEGATIVE 12/14/2020   [place] NEGATIVE 12/21/2018    CBC: Recent Labs  Lab 07/16/22 1420 07/17/22 0325  WBC 5.8 4.9  NEUTROABS 4.1  --   HGB 15.6 13.8  HCT 47.1 42.2  MCV 83.7 84.2  PLT 164 131*   Cardiac Enzymes: No results for input(s): "CKTOTAL", "CKMB", "CKMBINDEX", "TROPONINI" in the last 168 hours. BNP (last 3 results) No results for input(s): "PROBNP" in the last 8760 hours. CBG: No results for input(s): "GLUCAP" in the last 168 hours. D-Dimer: No results for input(s): "DDIMER" in the last 72 hours. Hgb A1c: No results for input(s): "HGBA1C" in the last 72 hours. Lipid Profile: No results for input(s): "CHOL", "HDL", "LDLCALC", "TRIG", "CHOLHDL", "LDLDIRECT" in the last 72 hours. Thyroid function studies: No results for input(s): "TSH", "T4TOTAL", "T3FREE", "THYROIDAB" in the last 72 hours.  Invalid input(s): "FREET3" Anemia work up: No results for input(s): "VITAMINB12", "FOLATE", "FERRITIN", "TIBC", "IRON", "RETICCTPCT" in the last 72 hours. Sepsis Labs: Recent Labs  Lab 07/16/22 1420 07/16/22 1923 07/17/22 0325  WBC 5.8  --  4.9  LATICACIDVEN  --  0.9  --    Microbiology No results found for this or any previous visit (from the past 240 hour(s)).   Medications:    carvedilol  25 mg Oral BID WC   morphine (PF)  4  mg Intravenous Once   pantoprazole (PROTONIX) IV  40 mg Intravenous QHS   Continuous Infusions:    LOS: 1 day   Charlynne Cousins  [facility]  07/17/2022, 7:20 AM

## 2022-07-17 NOTE — Progress Notes (Signed)
[facility]  Updated his wife and daughter. Updated team. Stay overnight to tolerate diet, pain control and ensure BM given this issues with obstruction and nausea vomiting in the ED.   Hopefully home tomorrow.   Curlene Labrum, MD [facility] [address] [place], [place] [phone] (office)

## 2022-07-17 NOTE — Consult Note (Signed)
[facility] Consult  Reason for Consult: Right inguinal hernia, incarcerated in the ED  Referring Physician: Dr. Tinnie Gens ED   Chief Complaint   Abdominal Pain     HPI: Timothy [name] is a 66 y.o. male with an incarcerated inguinal hernia that came into the ED last night. He has noticed the hernia on the right side in the past few months but it has just recently started bothering him. He had some nausea and vomiting before coming to the ED. Initially in the ED they were not able to reduce the hernia. A CT can demonstrated small bowel with some thickening and fluid in the hernia sac.  He has a history of HTN, HLD and CHF with improved EF on this last ECHO in 11/2021.   Past Medical History:  Diagnosis Date   Alcohol abuse    CHF (congestive heart failure) (HCC)    CKD (chronic kidney disease) stage 3, GFR 30-59 ml/min (HCC)    Hyperlipidemia    Hypertension    Noncompliance     History reviewed. No pertinent surgical history.  Family History  Problem Relation Age of Onset   Diabetes Mother    Hypertension Mother    Cancer Father        Prostate Cancer   Hypertension Father    Diabetes Sister    Diabetes Brother    Hypertension Brother    Hypertension Brother     Social History   Tobacco Use   Smoking status: Former    Types: Cigars   Smokeless tobacco: Never  Vaping Use   Vaping Use: Never used  Substance Use Topics   Alcohol use: Yes    Alcohol/week: 7.0 standard drinks of alcohol    Types: 7 Cans of beer per week    Comment: daily, 40oz beer   Drug use: Yes    Types: Cocaine    Comment: 06/16/22    Medications: I have reviewed the patient's current medications. Prior to Admission:  Medications Prior to Admission  Medication Sig Dispense Refill Last Dose   amLODipine (NORVASC) 10 MG tablet Take 1 tablet (10 mg total) by mouth daily for 30 days. 30 tablet 0 07/16/2022   carvedilol (COREG) 25 MG tablet Take 1 tablet (25 mg total) by mouth 2  (two) times daily with a meal for 30 days. 60 tablet 0 07/16/2022 at 0900   furosemide (LASIX) 20 MG tablet Take 20 mg by mouth daily as needed for fluid.   unknown   hydrALAZINE (APRESOLINE) 50 MG tablet Take 1 tablet (50 mg total) by mouth 3 (three) times daily. 270 tablet 3 07/16/2022   isosorbide dinitrate (ISORDIL) 20 MG tablet Take 20 mg by mouth 3 (three) times daily.   07/16/2022   isosorbide mononitrate (IMDUR) 60 MG 24 hr tablet TAKE ONE TABLET BY MOUTH ONCE DAILY. 90 tablet 1 07/16/2022   pravastatin (PRAVACHOL) 20 MG tablet Take 20 mg by mouth daily.   07/15/2022   pantoprazole (PROTONIX) 20 MG tablet Take 1 tablet (20 mg total) by mouth daily for 14 days. (Patient not taking: Reported on 07/16/2022) 14 tablet 0 Not Taking   traMADol (ULTRAM) 50 MG tablet Take 1 tablet (50 mg total) by mouth every 6 (six) hours as needed. (Patient not taking: Reported on 07/16/2022) 15 tablet 0 Not Taking   Scheduled:  carvedilol  25 mg Oral BID WC   morphine (PF)  4 mg Intravenous Once   pantoprazole (PROTONIX) IV  40 mg Intravenous  QHS   Continuous:  0.9 % NaCl with KCl 40 mEq / L Stopped (07/17/22 1021)   lactated ringers 10 mL/hr at 07/17/22 1134   WER:[id number], HYDROmorphone (DILAUDID) injection, metoprolol tartrate, ondansetron **OR** ondansetron (ZOFRAN) IV  No Known Allergies   ROS:  A comprehensive review of systems was negative except for: Gastrointestinal: positive for abdominal pain, nausea, and vomiting  Blood pressure 137/82, pulse (!) 57, temperature 99.4 F (37.4 C), temperature source Oral, resp. rate 14, height '5\' 7"'$  (1.702 m), weight 73.6 kg, SpO2 98 %. Physical Exam Vitals reviewed.  HENT:     Head: Normocephalic.  Cardiovascular:     Rate and Rhythm: Normal rate.  Pulmonary:     Effort: Pulmonary effort is normal.  Abdominal:     General: Abdomen is flat.     Palpations: Abdomen is soft.     Tenderness: There is no abdominal tenderness. There is no guarding  or rebound.     Hernia: A hernia is present. Hernia is present in the right inguinal area.  Musculoskeletal:     Comments: Moves all extremities   Skin:    General: Skin is warm.  Neurological:     General: No focal deficit present.     Mental Status: He is alert and oriented to person, place, and time.  Psychiatric:        Mood and Affect: Mood normal.        Behavior: Behavior normal.     Results: Results for orders placed or performed during the hospital encounter of 07/16/22 (from the past 48 hour(s))  CBC with Differential     Status: None   Collection Time: 07/16/22  2:20 PM  Result Value Ref Range   WBC 5.8 4.0 - 10.5 K/uL   RBC 5.63 4.22 - 5.81 MIL/uL   Hemoglobin 15.6 13.0 - 17.0 g/dL   HCT 47.1 39.0 - 52.0 %   MCV 83.7 80.0 - 100.0 fL   MCH 27.7 26.0 - 34.0 pg   MCHC 33.1 30.0 - 36.0 g/dL   RDW 13.7 11.5 - 15.5 %   Platelets 164 150 - 400 K/uL   nRBC 0.0 0.0 - 0.2 %   Neutrophils Relative % 69 %   Neutro Abs 4.1 1.7 - 7.7 K/uL   Lymphocytes Relative 17 %   Lymphs Abs 1.0 0.7 - 4.0 K/uL   Monocytes Relative 5 %   Monocytes Absolute 0.3 0.1 - 1.0 K/uL   Eosinophils Relative 7 %   Eosinophils Absolute 0.4 0.0 - 0.5 K/uL   Basophils Relative 2 %   Basophils Absolute 0.1 0.0 - 0.1 K/uL   Immature Granulocytes 0 %   Abs Immature Granulocytes 0.01 0.00 - 0.07 K/uL    Comment: Performed at [facility], [address] [place], [place] [address]  Comprehensive metabolic panel     Status: Abnormal   Collection Time: 07/16/22  2:20 PM  Result Value Ref Range   Sodium 137 135 - 145 mmol/L   Potassium 3.9 3.5 - 5.1 mmol/L   Chloride 103 98 - 111 mmol/L   CO2 26 22 - 32 mmol/L   Glucose, Bld 129 (H) 70 - 99 mg/dL    Comment: Glucose reference range applies only to samples taken after fasting for at least 8 hours.   BUN 18 8 - 23 mg/dL   Creatinine, Ser 1.64 (H) 0.61 - 1.24 mg/dL   Calcium 9.3 8.9 - 10.3 mg/dL   Total Protein 8.0 6.5 - 8.1  g/dL   Albumin 4.5 3.5 -  5.0 g/dL   AST 18 15 - 41 U/L   ALT 12 0 - 44 U/L   Alkaline Phosphatase 62 38 - 126 U/L   Total Bilirubin 1.3 (H) 0.3 - 1.2 mg/dL   GFR, Estimated 45 (L) >60 mL/min    Comment: (NOTE) Calculated using the CKD-EPI Creatinine Equation (2021)    Anion gap 8 5 - 15    Comment: Performed at [facility], [address] [place], [place] [address]  Lactic acid, plasma     Status: None   Collection Time: 07/16/22  7:23 PM  Result Value Ref Range   Lactic Acid, Venous 0.9 0.5 - 1.9 mmol/L    Comment: Performed at [facility], [address], [place], [place] [address]  Basic metabolic panel     Status: Abnormal   Collection Time: 07/17/22  3:25 AM  Result Value Ref Range   Sodium 139 135 - 145 mmol/L   Potassium 3.3 (L) 3.5 - 5.1 mmol/L   Chloride 107 98 - 111 mmol/L   CO2 24 22 - 32 mmol/L   Glucose, Bld 85 70 - 99 mg/dL    Comment: Glucose reference range applies only to samples taken after fasting for at least 8 hours.   BUN 15 8 - 23 mg/dL   Creatinine, Ser 1.41 (H) 0.61 - 1.24 mg/dL   Calcium 8.8 (L) 8.9 - 10.3 mg/dL   GFR, Estimated 54 (L) >60 mL/min    Comment: (NOTE) Calculated using the CKD-EPI Creatinine Equation (2021)    Anion gap 8 5 - 15    Comment: Performed at [facility], [address], [place], [place] [address]  CBC     Status: Abnormal   Collection Time: 07/17/22  3:25 AM  Result Value Ref Range   WBC 4.9 4.0 - 10.5 K/uL   RBC 5.01 4.22 - 5.81 MIL/uL   Hemoglobin 13.8 13.0 - 17.0 g/dL   HCT 42.2 39.0 - 52.0 %   MCV 84.2 80.0 - 100.0 fL   MCH 27.5 26.0 - 34.0 pg   MCHC 32.7 30.0 - 36.0 g/dL   RDW 13.8 11.5 - 15.5 %   Platelets 131 (L) 150 - 400 K/uL   nRBC 0.0 0.0 - 0.2 %    Comment: Performed at [facility], [address], [place], [place] [address]  Magnesium     Status: None   Collection Time: 07/17/22  3:25 AM  Result Value Ref Range   Magnesium 2.0 1.7 - 2.4 mg/dL    Comment: Performed at [facility], [address] [place], [place] [address]   Phosphorus     Status: None   Collection Time: 07/17/22  3:25 AM  Result Value Ref Range   Phosphorus 2.9 2.5 - 4.6 mg/dL    Comment: Performed at [facility], [address], [place], [place]  [address]   Personally reviewed- hernia with small bowel and fluid   CT Abdomen Pelvis W Contrast  Result Date: 07/16/2022 CLINICAL DATA:  Right inguinal swelling EXAM: CT ABDOMEN AND PELVIS WITH CONTRAST TECHNIQUE: Multidetector CT imaging of the abdomen and pelvis was performed using the standard protocol following bolus administration of intravenous contrast. RADIATION DOSE REDUCTION: This exam was performed according to the departmental dose-optimization program which includes automated exposure control, adjustment of the mA and/or kV according to patient size and/or use of iterative reconstruction technique. CONTRAST:  184m OMNIPAQUE IOHEXOL 300 MG/ML  SOLN COMPARISON:  06/17/2022 FINDINGS:  Lower chest: Heart is enlarged in size. Coronary artery calcifications are seen. Minimal pericardial effusion is seen. Motion artifacts limit evaluation of lower lung fields. Small patchy infiltrates are noted in the posterior lower lung fields on both sides, more so on the left side. Hepatobiliary: No focal abnormalities are seen in liver. Gallbladder is unremarkable. There is no dilation of bile ducts. Pancreas: No focal abnormalities are seen. Spleen: Spleen measures 13.1 cm in maximum diameter. Adrenals/Urinary Tract: There is 7 mm nodule in right adrenal. There is 1.4 cm nodule in left adrenal. There is hyperplasia of adrenals, more so on the left side. These findings appear stable. There is no hydronephrosis. There are no renal or ureteral stones. There are scattered calcifications in renal artery branches. There are multiple smooth marginated low-density lesions of varying sizes in both kidneys suggesting bilateral renal cysts. Largest of the lesions is in the lower pole of left kidney measuring 8.6 cm in diameter.  These lesions appear stable. Urinary bladder is unremarkable. Stomach/Bowel: Small hiatal hernia is seen. There is mild wall thickening in lower thoracic esophagus. Stomach is otherwise unremarkable. There is dilation of distal small bowel loops measuring up to 3.5 cm in diameter. There is mild diffuse wall thickening in small bowel loop in right lower quadrant close to the inguinal canal. Small bowel loop is seen extending into the right inguinal canal. There is moderate to large amount of fluid in the right inguinal canal. Fluid-fluid level is seen in the right inguinal canal. Findings suggest possible incarceration of small bowel loop with obstruction. Surgical consultation should be considered. Terminal ileum is not dilated. Appendix is not dilated. Scattered diverticula are seen in colon without signs of focal acute diverticulitis. Vascular/Lymphatic: Extensive arterial calcifications are seen in aorta and its major branches. Reproductive: Prostate is enlarged. Other: There is no ascites or pneumoperitoneum. Small umbilical hernia containing fat is seen. Musculoskeletal: Degenerative changes are noted in lumbar spine. IMPRESSION: Right inguinal hernia containing small bowel loop which appears to be incarcerated causing small-bowel obstruction. There is moderate to large amount of fluid in the right inguinal canal related to incarceration of a small bowel loop. There is mild diffuse wall thickening in the small bowel loop adjacent to the right inguinal hernia suggesting possible inflammation. Surgical consultation should be considered. There is no hydronephrosis.  Appendix is not dilated. Small patchy infiltrates are seen in both lower lung fields, more so on the left side suggesting subsegmental atelectasis or early pneumonia. Cardiomegaly. Coronary artery disease. Severe aortic atherosclerosis. Spleen is slightly enlarged in size. Small hiatal hernia. Bilateral renal cysts. Hyperplasia of both adrenals has not  changed significantly. Diverticulosis of colon without signs of focal diverticulitis. Other findings as described in the body of the report. Imaging finding of incarcerated obstructing right inguinal hernia was relayed to patient's provider Dr. Unk Lightning by telephone call. Electronically Signed   By: Elmer Picker M.D.   On: 07/16/2022 18:54     Assessment & Plan:  UDAY JANTZ is a 66 y.o. male with a right inguinal hernia that was incarcerated and required much maneuvering from the ED to get it reduced. He had bowel in the hernia with some thickening but lactic was normal. Exam today reassuring. Discussed hernia repair and staying overnight to monitor and make sure he can eat.   Discussed the risk and benefits including, bleeding, infection, use of mesh, risk of recurrence, risk of nerve damage causing numbness or changes in sensation, risk of damage to the cord  structures. The patient understands the risk and benefits of repair with mesh, and has decided to proceed.  We also discussed open versus robotic assisted laparoscopic surgery and the use of mesh. We discussed that I do both robotic and open repairs with mesh, and that these are considered equivalent. We discussed reasons for opting for laparoscopic surgery including if a bilateral repair is needed or if a patient has a recurrence after an open repair. We discussed the option of watch and wait in men and discussed that in 5 years some studies report that 40% of men have crossed over to needing a hernia repair because the hernia has become larger or symptomatic. We discussed that women are not appropriate candidate for watchful waiting due to the risk of femoral hernias.   Plan for an open hernia repair with mesh given recent incarceration and no robot availability today.     All questions were answered to the satisfaction of the patient.    Timothy [name] 07/17/2022, 11:38 AM

## 2022-07-17 NOTE — Care Management Important Message (Signed)
Important Message  Patient Details  Name: BELLAMY JUDSON MRN: [id number] Date of Birth: 04/23/1956   Medicare Important Message Given:  N/A - LOS <3 / Initial given by admissions     Tommy Medal 07/17/2022, 9:56 AM

## 2022-07-17 NOTE — Anesthesia Procedure Notes (Signed)
Procedure Name: Intubation Date/Time: 07/17/2022 11:48 AM  Performed by: Maude Leriche, CRNAPre-anesthesia Checklist: Patient identified, Emergency Drugs available, Suction available and Patient being monitored Patient Re-evaluated:Patient Re-evaluated prior to induction Oxygen Delivery Method: Circle system utilized Preoxygenation: Pre-oxygenation with 100% oxygen Induction Type: IV induction Ventilation: Mask ventilation without difficulty Laryngoscope Size: Miller and 2 Grade View: Grade I Tube type: Oral Tube size: 7.0 mm Number of attempts: 1 Placement Confirmation: ETT inserted through vocal cords under direct vision, positive ETCO2 and breath sounds checked- equal and bilateral Secured at: 23 cm Tube secured with: Tape Dental Injury: Teeth and Oropharynx as per pre-operative assessment

## 2022-07-17 NOTE — Anesthesia Preprocedure Evaluation (Addendum)
Anesthesia Evaluation  Patient identified by MRN, date of birth, ID band Patient awake    Reviewed: Allergy & Precautions, H&P , NPO status , Patient's Chart, lab work & pertinent test results, reviewed documented beta blocker date and time   Airway Mallampati: II  TM Distance: >3 FB Neck ROM: full    Dental no notable dental hx. (+) Poor Dentition, Loose   Pulmonary former smoker   Pulmonary exam normal breath sounds clear to auscultation       Cardiovascular Exercise Tolerance: Good hypertension, +CHF  + dysrhythmias Atrial Fibrillation  Rhythm:regular Rate:Normal     Neuro/Psych negative neurological ROS  negative psych ROS   GI/Hepatic ,GERD  Medicated,,(+)     substance abuse  alcohol use  Endo/Other  negative endocrine ROS    Renal/GU CRFRenal disease  negative genitourinary   Musculoskeletal   Abdominal   Peds  Hematology negative hematology ROS (+)   Anesthesia Other Findings 1. Left ventricular ejection fraction, by estimation, is 45 to 50%. The  left ventricle has mildly decreased function. The left ventricle  demonstrates global hypokinesis. There is severe asymmetric left  ventricular hypertrophy of the basal-septal  segment. Left ventricular diastolic parameters are indeterminate.   2. Right ventricular systolic function is normal. The right ventricular  size is normal.   3. Left atrial size was severely dilated.   4. Right atrial size was moderately dilated.   5. The pericardial effusion is circumferential.   6. The mitral valve is grossly normal. Trivial mitral valve  regurgitation. MVA by 2d Planimetry 3.86 cm2.   7. The aortic valve is tricuspid. There is mild calcification of the  aortic valve. Aortic valve regurgitation is trivial. No aortic stenosis is  present.   8. The inferior vena cava is normal in size with greater than 50%  respiratory variability, suggesting right atrial pressure  of 3 mmHg.    Reproductive/Obstetrics negative OB ROS                             Anesthesia Physical Anesthesia Plan  ASA: 4 and emergent  Anesthesia Plan: General, General ETT and Rapid Sequence   Post-op Pain Management:    Induction:   PONV Risk Score and Plan: Ondansetron  Airway Management Planned:   Additional Equipment:   Intra-op Plan:   Post-operative Plan:   Informed Consent: I have reviewed the patients History and Physical, chart, labs and discussed the procedure including the risks, benefits and alternatives for the proposed anesthesia with the patient or authorized representative who has indicated his/her understanding and acceptance.     Dental Advisory Given  Plan Discussed with: CRNA  Anesthesia Plan Comments: (Beta blocker PRN  Left upper tooth left.  Pt currently has 5 teeth.)       Anesthesia Quick Evaluation

## 2022-07-17 NOTE — Discharge Instructions (Signed)
Discharge Instructions Hernia:  Common Complaints: Pain at the incision site is common. This will improve with time. Take your pain medications as described below. Some nausea is common and poor appetite. The main goal is to stay hydrated the first few days after surgery.  Numbness at the incision or the thigh is common.  If you start to have burning or tingling pain in your groin, this is from a nerve being pinched. Please call and we can prescribe you a different type of pain medication for nerve pain.  Bruising in the side of the repair and down into the scrotum on that side is normal, if the bruising is spreading, call the office.  Some swelling is normal, place a towel under your scrotum (folded into a square) to help with elevation of the scrotum and swelling. Ice the side for the first 72 hours.   Diet/ Activity: Diet as tolerated. You may not have an appetite, but it is important to stay hydrated. Drink 64 ounces of water a day. Your appetite will return with time.  Shower per your regular routine daily.  Do not take hot showers. Take warm showers that are less than 10 minutes. Rest and listen to your body, but do not remain in bed all day.  Walk everyday for at least 15-20 minutes. Deep cough and move around every 1-2 hours in the first few days after surgery.  Do not pick at the dermabond glue on your incision sites.  This glue film will remain in place for 1-2 weeks and will start to peel off.  Do not place lotions or balms on your incision unless instructed to specifically by Dr. Constance Haw.  Do not lift > 10 lbs, perform excessive bending, pushing, pulling, squatting for 6-8 weeks after surgery.   Pain Expectations and Narcotics: -After surgery you will have pain associated with your incisions and this is normal. The pain is muscular and nerve pain, and will get better with time. -You are encouraged and expected to take non narcotic medications like tylenol and ibuprofen (when able)  to treat pain as multiple modalities can aid with pain treatment. -Narcotics are only used when pain is severe or there is breakthrough pain. -You are not expected to have a pain score of 0 after surgery, as we cannot prevent pain. A pain score of 3-4 that allows you to be functional, move, walk, and tolerate some activity is the goal. The pain will continue to improve over the days after surgery and is dependent on your surgery. -Due to [place] law, we are only able to give a certain amount of pain medication to treat post operative pain, and we only give additional narcotics on a patient by patient basis.  -For most laparoscopic surgery, studies have shown that the majority of patients only need 10-15 narcotic pills, and for open surgeries most patients only need 15-20.   -Having appropriate expectations of pain and knowledge of pain management with non narcotics is important as we do not want anyone to become addicted to narcotic pain medication.  -Using ice packs in the first 48 hours and heating pads after 48 hours, wearing an abdominal binder (when recommended), and using over the counter medications are all ways to help with pain management.   -Simple acts like meditation and mindfulness practices after surgery can also help with pain control and research has proven the benefit of these practices.  Medication: Take tylenol and ibuprofen as needed for pain control, alternating every 4-6 hours.  Example:  Tylenol '1000mg'$  @ 6am, 12noon, 6pm, 70mdnight (Do not exceed '4000mg'$  of tylenol a day). Ibuprofen '800mg'$  @ 9am, 3pm, 9pm, 3am (Do not exceed '3600mg'$  of ibuprofen a day).  Take Roxicodone for breakthrough pain every 4 hours.  Take Colace for constipation related to narcotic pain medication. If you do not have a bowel movement in 2 days, take Miralax over the counter.  Drink plenty of water to also prevent constipation.   Contact Information: If you have questions or concerns, please call our office,  [phone] Monday- Thursday 8AM-5PM and Friday 8AM-12Noon.  If it is after hours or on the weekend, please call [facility] Main [facility], [phone] and ask to speak to the surgeon on call for Dr. [facility]Constance Haw[facility]

## 2022-07-17 NOTE — Op Note (Signed)
[facility] Operative Note  07/17/22  Preoperative Diagnosis: Right inguinal hernia    Postoperative Diagnosis: Same   Procedure(s) Performed: Direct right inguinal hernia repair with mesh   Surgeon: Lanell Matar. Constance Haw, MD   Assistants: No qualified resident was available   Anesthesia: General endotracheal   Anesthesiologist: Dr. Briant Cedar, MD    Specimens: Hernia sac    Estimated Blood Loss: Minimal   Blood Replacement: None    Complications: None   Wound Class: Clean    Operative Indications: Timothy [name] is a 66 yo with an incarcerated R inguinal hernia that was finally reduced in the ED. He was admitted for repair today after they were able to get it reduced. We discussed risk of bleeding, infection, injury to cord or nerve structures, and possible need for bowel resection if the bowel was not fully reduced and was compromised.    Findings: Direct Hernia reduced, large sac with some fluid   Procedure: The patient was taken to the operating room and placed supine. General endotracheal anesthesia was induced. Intravenous antibiotics were administered per protocol.  A time out was preformed verifying the correct patient, procedure, site, positioning and implants.  The right groin and scrotum were prepared and draped in the usual sterile fashion.   An incision was made in a natural skin crease between the pubic tubercle and the anterior superior iliac spine.  The incision was deepened with electrocautery through Scarpa's and Camper's fascia until the aponeurosis of the external oblique was encountered.  This was cleaned and the external ring was exposed.  An incision was made in the midportion of the external oblique aponeurosis in the direction of its fibers. The ilioinguinal nerve was identified but had been cut during the incision on the external oblique aponeurosis near the external ring. Flaps of the external oblique were developed cephalad and inferiorly.     The cord was identified and it was gently dissented free at the pubic tubercle and encircled with a Penrose drain.  Attention was then directed at the anteromedial aspect of the cord, where no indirect hernia sac was identified.  There was a small lipoma that was excised. The vas and testicular vessels were identified and protected from harm. Attention was then turned to the floor of the canal, which demonstrated a direct hernia medial to the epigastric vessels. This was carefully dissected out around the conjoint tendon and inguinal ligament, and I opened the sac to ensure all of the contents were reduced. There was some fluid but no bowel. I was unable to see any small bowel deep in the sac as the sac was very long and redundant. The excess sac was suture ligated with 2-0 Vicryl suture twice and  the excess was sent for pathology.   A large Perfix Plug was placed into the defect and filled the space.  This was secured with 2-0 Novafil to the conjoint tendon so it did not move. The Perfix Mesh Patch was sutured to the inguinal ligament inferiorly starting at the pubic tubercle using 2-0 Novafil interrupted sutures.  The mesh was sutured superiorly to the conjoint tendon using 2-0 Novafil interrupted sutures.  Care was taken to ensure the mesh was placed in a relaxed fashion to avoid excessive tension and no neurovascular structures were caught in the repair.  Laterally the tails of the mesh were crossed and the internal ring was recreated, allowing for passage of cords without tension.  Robynn Pane was layered into the repair.   Hemostasis was  adequate.  The Penrose was removed.  The external oblique aponeurosis was closed with a 2-0 Vicryl suture in a running fashion, taking care to not catch the ilioinguinal nerve in the suture line.  Scarpa's fashion was closed with 3-0 Vicryl interrupted sutures. The skin was closed with a subcuticular 4-0 Monocryl suture.  Dermabond was applied.   The testis was gently  pulled down into its anatomic position in the scrotum.  The patient tolerated the procedure well and was taken to the PACU in stable condition. All counts were correct at the end of the case.        Timothy Labrum, MD [facility] [address] [place], [place] [address] [phone] (office)

## 2022-07-17 NOTE — Transfer of Care (Signed)
Immediate Anesthesia Transfer of Care Note  Patient: Timothy [name]  Procedure(s) Performed: HERNIA REPAIR INGUINAL INCARCERATED (Right: Groin)  Patient Location: PACU  Anesthesia Type:General  Level of Consciousness: drowsy, patient cooperative, and responds to stimulation  Airway & Oxygen Therapy: Patient Spontanous Breathing and Patient connected to nasal cannula oxygen  Post-op Assessment: Report given to RN, Post -op Vital signs reviewed and stable, Patient moving all extremities X 4, and Patient able to stick tongue midline  Post vital signs: Reviewed  Last Vitals:  Vitals Value Taken Time  BP 147/68 07/17/22 1335  Temp 37.1 C 07/17/22 1335  Pulse 51 07/17/22 1337  Resp 13 07/17/22 1337  SpO2 98 % 07/17/22 1337  Vitals shown include unvalidated device data.  Last Pain:  Vitals:   07/17/22 0926  TempSrc: Oral  PainSc: 0-No pain         Complications: No notable events documented.

## 2022-07-18 DIAGNOSIS — K4090 Unilateral inguinal hernia, without obstruction or gangrene, not specified as recurrent: Secondary | ICD-10-CM | POA: Diagnosis not present

## 2022-07-18 DIAGNOSIS — K460 Unspecified abdominal hernia with obstruction, without gangrene: Secondary | ICD-10-CM | POA: Diagnosis not present

## 2022-07-18 MED ORDER — POTASSIUM CHLORIDE CRYS ER 20 MEQ PO TBCR: 20.0000 meq | EXTENDED_RELEASE_TABLET | Freq: Two times a day (BID) | ORAL | Status: DC

## 2022-07-18 MED ORDER — POTASSIUM CHLORIDE CRYS ER 20 MEQ PO TBCR
40.0000 meq | EXTENDED_RELEASE_TABLET | Freq: Two times a day (BID) | ORAL | Status: DC
  Administered 2022-07-18: 40 meq via ORAL
  Filled 2022-07-18: qty 2

## 2022-07-18 NOTE — Progress Notes (Signed)
Patient slept throughout this shift, he did not complain of any pain this shift. No complaints of pain or discomfort at this time, wife remains at bedside.

## 2022-07-18 NOTE — Progress Notes (Signed)
[facility] Discharge Note  Admit Date:  07/16/2022 Discharge date: 07/18/2022   Beulah Gandy to be D/C'd Home per MD order.  AVS completed. Patient/caregiver able to verbalize understanding.  Discharge Medication: Allergies as of 07/18/2022   No Known Allergies      Medication List     STOP taking these medications    isosorbide dinitrate 20 MG tablet Commonly known as: ISORDIL       TAKE these medications    amLODipine 10 MG tablet Commonly known as: NORVASC Take 1 tablet (10 mg total) by mouth daily for 30 days.   carvedilol 25 MG tablet Commonly known as: COREG Take 1 tablet (25 mg total) by mouth 2 (two) times daily with a meal for 30 days.   furosemide 20 MG tablet Commonly known as: LASIX Take 20 mg by mouth daily as needed for fluid.   hydrALAZINE 50 MG tablet Commonly known as: APRESOLINE Take 1 tablet (50 mg total) by mouth 3 (three) times daily.   isosorbide mononitrate 60 MG 24 hr tablet Commonly known as: IMDUR TAKE ONE TABLET BY MOUTH ONCE DAILY.   pantoprazole 20 MG tablet Commonly known as: PROTONIX Take 1 tablet (20 mg total) by mouth daily for 14 days.   pravastatin 20 MG tablet Commonly known as: PRAVACHOL Take 20 mg by mouth daily.   traMADol 50 MG tablet Commonly known as: ULTRAM Take 1 tablet (50 mg total) by mouth every 6 (six) hours as needed.        Discharge Assessment: Vitals:   07/18/22 0009 07/18/22 0547  BP: (!) 151/69 (!) 168/88  Pulse: (!) 56 (!) 59  Resp: 15 14  Temp: 98.1 F (36.7 C) 98.5 F (36.9 C)  SpO2: 97% 96%   Skin clean, dry and intact without evidence of skin break down, no evidence of skin tears noted. IV catheter discontinued intact. Site without signs and symptoms of complications - no redness or edema noted at insertion site, patient denies c/o pain - only slight tenderness at site.  Dressing with slight pressure applied.  D/c Instructions-Education: Discharge instructions given to patient/family  with verbalized understanding. D/c education completed with patient/family including follow up instructions, medication list, d/c activities limitations if indicated, with other d/c instructions as indicated by MD - patient able to verbalize understanding, all questions fully answered. Patient instructed to return to ED, call 911, or call MD for any changes in condition.  Patient escorted via [place], and D/C home via private auto.  Richrd Prime, LPN [id number] 8:41 AM

## 2022-07-18 NOTE — Discharge Summary (Signed)
Physician Discharge Summary  Timothy [name] ZOX:[id number] DOB: May 29, 1956 DOA: 07/16/2022  PCP: Yves Dill, NP  Admit date: 07/16/2022 Discharge date: 07/18/2022  Admitted From: Home Disposition:  Home  Recommendations for Outpatient Follow-up:  Follow up with PCP in 1-2 weeks Please obtain BMP/CBC in one week   Home Health:No Equipment/Devices:None  Discharge Condition:Stable CODE STATUS:Full Diet recommendation: Heart Healthy  Brief/Interim Summary: 66 y.o. male past medical history significant for essential hypertension right inguinal hernia presents to the ED due to right inguinal hernia pain associated with nausea and vomiting the scan of the abdomen and pelvis showed a right inguinal hernia containing small bowel which appears to be incarcerated General surgery was consulted recommended open reduction of the hernia n.p.o.   Discharge Diagnoses:  Principal Problem:   Right inguinal hernia Active Problems:   Essential hypertension, benign   Mixed hyperlipidemia   Hx of atrial flutter   Alcohol abuse   Small bowel obstruction (HCC)   Chronic kidney disease, stage 3a (HCC)   GERD (gastroesophageal reflux disease)   Drug abuse (HCC)   Nausea & vomiting   Incarcerated hernia  Incarcerated right inguinal hernia: CT scan of the abdomen pelvis showed incarcerated anemia mild diffuse wall thickening with inflammatory changes. General surgery was consulted and status post hernia repair on 07/17/2022. He was having bowel movement tolerating his diet.  Small bowel obstruction: He was treated conservatively with IV fluids and IV narcotics he was passing gas on the day of discharge had had a bowel movement.  Essential hypertension: No changes made to his medication.  Chronic kidney stage IIIa: His creatinine appears to be at baseline.  GERD: Continue Protonix.  History of atypical flutter: Currently rate controlled on Coreg, resume Eliquis as an  outpatient.  Dyslipidemia: Continue current regimen no changes made.  Discharge Instructions  Discharge Instructions     Diet - low sodium heart healthy   Complete by: As directed    Increase activity slowly   Complete by: As directed    No wound care   Complete by: As directed       Allergies as of 07/18/2022   No Known Allergies      Medication List     STOP taking these medications    isosorbide dinitrate 20 MG tablet Commonly known as: ISORDIL       TAKE these medications    amLODipine 10 MG tablet Commonly known as: NORVASC Take 1 tablet (10 mg total) by mouth daily for 30 days.   carvedilol 25 MG tablet Commonly known as: COREG Take 1 tablet (25 mg total) by mouth 2 (two) times daily with a meal for 30 days.   furosemide 20 MG tablet Commonly known as: LASIX Take 20 mg by mouth daily as needed for fluid.   hydrALAZINE 50 MG tablet Commonly known as: APRESOLINE Take 1 tablet (50 mg total) by mouth 3 (three) times daily.   isosorbide mononitrate 60 MG 24 hr tablet Commonly known as: IMDUR TAKE ONE TABLET BY MOUTH ONCE DAILY.   pantoprazole 20 MG tablet Commonly known as: PROTONIX Take 1 tablet (20 mg total) by mouth daily for 14 days.   pravastatin 20 MG tablet Commonly known as: PRAVACHOL Take 20 mg by mouth daily.   traMADol 50 MG tablet Commonly known as: ULTRAM Take 1 tablet (50 mg total) by mouth every 6 (six) hours as needed.        Follow-up Information     Virl Cagey, MD  Follow up on 08/13/2022.   Specialty: General Surgery Why: post op check hernia repair Contact information: [address] [place] [facility] [address] [phone]                No Known Allergies  Consultations: General surgery   Procedures/Studies: CT Abdomen Pelvis W Contrast  Result Date: 07/16/2022 CLINICAL DATA:  Right inguinal swelling EXAM: CT ABDOMEN AND PELVIS WITH CONTRAST TECHNIQUE: Multidetector CT imaging of the abdomen  and pelvis was performed using the standard protocol following bolus administration of intravenous contrast. RADIATION DOSE REDUCTION: This exam was performed according to the departmental dose-optimization program which includes automated exposure control, adjustment of the mA and/or kV according to patient size and/or use of iterative reconstruction technique. CONTRAST:  129m OMNIPAQUE IOHEXOL 300 MG/ML  SOLN COMPARISON:  06/17/2022 FINDINGS: Lower chest: Heart is enlarged in size. Coronary artery calcifications are seen. Minimal pericardial effusion is seen. Motion artifacts limit evaluation of lower lung fields. Small patchy infiltrates are noted in the posterior lower lung fields on both sides, more so on the left side. Hepatobiliary: No focal abnormalities are seen in liver. Gallbladder is unremarkable. There is no dilation of bile ducts. Pancreas: No focal abnormalities are seen. Spleen: Spleen measures 13.1 cm in maximum diameter. Adrenals/Urinary Tract: There is 7 mm nodule in right adrenal. There is 1.4 cm nodule in left adrenal. There is hyperplasia of adrenals, more so on the left side. These findings appear stable. There is no hydronephrosis. There are no renal or ureteral stones. There are scattered calcifications in renal artery branches. There are multiple smooth marginated low-density lesions of varying sizes in both kidneys suggesting bilateral renal cysts. Largest of the lesions is in the lower pole of left kidney measuring 8.6 cm in diameter. These lesions appear stable. Urinary bladder is unremarkable. Stomach/Bowel: Small hiatal hernia is seen. There is mild wall thickening in lower thoracic esophagus. Stomach is otherwise unremarkable. There is dilation of distal small bowel loops measuring up to 3.5 cm in diameter. There is mild diffuse wall thickening in small bowel loop in right lower quadrant close to the inguinal canal. Small bowel loop is seen extending into the right inguinal canal.  There is moderate to large amount of fluid in the right inguinal canal. Fluid-fluid level is seen in the right inguinal canal. Findings suggest possible incarceration of small bowel loop with obstruction. Surgical consultation should be considered. Terminal ileum is not dilated. Appendix is not dilated. Scattered diverticula are seen in colon without signs of focal acute diverticulitis. Vascular/Lymphatic: Extensive arterial calcifications are seen in aorta and its major branches. Reproductive: Prostate is enlarged. Other: There is no ascites or pneumoperitoneum. Small umbilical hernia containing fat is seen. Musculoskeletal: Degenerative changes are noted in lumbar spine. IMPRESSION: Right inguinal hernia containing small bowel loop which appears to be incarcerated causing small-bowel obstruction. There is moderate to large amount of fluid in the right inguinal canal related to incarceration of a small bowel loop. There is mild diffuse wall thickening in the small bowel loop adjacent to the right inguinal hernia suggesting possible inflammation. Surgical consultation should be considered. There is no hydronephrosis.  Appendix is not dilated. Small patchy infiltrates are seen in both lower lung fields, more so on the left side suggesting subsegmental atelectasis or early pneumonia. Cardiomegaly. Coronary artery disease. Severe aortic atherosclerosis. Spleen is slightly enlarged in size. Small hiatal hernia. Bilateral renal cysts. 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 Diverticulosis of colon without signs of focal diverticulitis.  Other findings as described in the body of the report. Imaging finding of incarcerated obstructing right inguinal hernia was relayed to patient's provider Dr. Unk Lightning by telephone call. Electronically Signed   By: Elmer Picker M.D.   On: 07/16/2022 18:54   (Echo, Carotid, EGD, Colonoscopy, ERCP)    Subjective: No complaints  Discharge Exam: Vitals:    07/18/22 0009 07/18/22 0547  BP: (!) 151/69 (!) 168/88  Pulse: (!) 56 (!) 59  Resp: 15 14  Temp: 98.1 F (36.7 C) 98.5 F (36.9 C)  SpO2: 97% 96%   Vitals:   07/17/22 1405 07/17/22 2006 07/18/22 0009 07/18/22 0547  BP: (!) 145/67 131/64 (!) 151/69 (!) 168/88  Pulse: (!) 53 (!) 56 (!) 56 (!) 59  Resp: '14 20 15 14  '$ Temp:  98.8 F (37.1 C) 98.1 F (36.7 C) 98.5 F (36.9 C)  TempSrc:  Oral Oral Oral  SpO2: 96% 96% 97% 96%  Weight:      Height:        General: Pt is alert, awake, not in acute distress Cardiovascular: RRR, S1/S2 +, no rubs, no gallops Respiratory: CTA bilaterally, no wheezing, no rhonchi Abdominal: Soft, NT, ND, bowel sounds + Extremities: no edema, no cyanosis    The results of significant diagnostics from this hospitalization (including imaging, microbiology, ancillary and laboratory) are listed below for reference.     Microbiology: No results found for this or any previous visit (from the past 240 hour(s)).   Labs: BNP (last 3 results) No results for input(s): "BNP" in the last 8760 hours. Basic Metabolic Panel: Recent Labs  Lab 07/16/22 1420 07/17/22 0325  NA 137 139  K 3.9 3.3*  CL 103 107  CO2 26 24  GLUCOSE 129* 85  BUN 18 15  CREATININE 1.64* 1.41*  CALCIUM 9.3 8.8*  MG  --  2.0  PHOS  --  2.9   Liver Function Tests: Recent Labs  Lab 07/16/22 1420  AST 18  ALT 12  ALKPHOS 62  BILITOT 1.3*  PROT 8.0  ALBUMIN 4.5   No results for input(s): "LIPASE", "AMYLASE" in the last 168 hours. No results for input(s): "AMMONIA" in the last 168 hours. CBC: Recent Labs  Lab 07/16/22 1420 07/17/22 0325  WBC 5.8 4.9  NEUTROABS 4.1  --   HGB 15.6 13.8  HCT 47.1 42.2  MCV 83.7 84.2  PLT 164 131*   Cardiac Enzymes: No results for input(s): "CKTOTAL", "CKMB", "CKMBINDEX", "TROPONINI" in the last 168 hours. BNP: Invalid input(s): "POCBNP" CBG: No results for input(s): "GLUCAP" in the last 168 hours. D-Dimer No results for input(s):  "DDIMER" in the last 72 hours. Hgb A1c No results for input(s): "HGBA1C" in the last 72 hours. Lipid Profile No results for input(s): "CHOL", "HDL", "LDLCALC", "TRIG", "CHOLHDL", "LDLDIRECT" in the last 72 hours. Thyroid function studies No results for input(s): "TSH", "T4TOTAL", "T3FREE", "THYROIDAB" in the last 72 hours.  Invalid input(s): "FREET3" Anemia work up No results for input(s): "VITAMINB12", "FOLATE", "FERRITIN", "TIBC", "IRON", "RETICCTPCT" in the last 72 hours. Urinalysis    Component Value Date/Time   COLORURINE COLORLESS (A) 06/17/2022 1415   APPEARANCEUR CLEAR 06/17/2022 1415   LABSPEC 1.009 06/17/2022 1415   PHURINE 7.0 06/17/2022 1415   GLUCOSEU 50 (A) 06/17/2022 1415   HGBUR SMALL (A) 06/17/2022 1415   BILIRUBINUR NEGATIVE 06/17/2022 1415   KETONESUR 5 (A) 06/17/2022 1415   PROTEINUR 100 (A) 06/17/2022 1415   UROBILINOGEN 0.2 02/03/2008 1342   NITRITE NEGATIVE 06/17/2022  [place] 06/17/2022 1415   Sepsis Labs Recent Labs  Lab 07/16/22 1420 07/17/22 0325  WBC 5.8 4.9   Microbiology No results found for this or any previous visit (from the past 240 hour(s)).   SIGNED:   Charlynne Cousins, MD  [facility] 07/18/2022, 7:08 AM Pager   If 7PM-7AM, please contact night-coverage [email or web address] Password [email or web address]

## 2022-07-19 NOTE — Anesthesia Postprocedure Evaluation (Signed)
Anesthesia Post Note  Patient: Timothy [name]  Procedure(s) Performed: HERNIA REPAIR INGUINAL INCARCERATED (Right: Groin)  Patient location during evaluation: Phase II Anesthesia Type: General Level of consciousness: awake Pain management: pain level controlled Vital Signs Assessment: post-procedure vital signs reviewed and stable Respiratory status: spontaneous breathing and respiratory function stable Cardiovascular status: blood pressure returned to baseline and stable Postop Assessment: no headache and no apparent nausea or vomiting Anesthetic complications: no Comments: Late entry   No notable events documented.   Last Vitals:  Vitals:   07/18/22 0009 07/18/22 0547  BP: (!) 151/69 (!) 168/88  Pulse: (!) 56 (!) 59  Resp: 15 14  Temp: 36.7 C 36.9 C  SpO2: 97% 96%    Last Pain:  Vitals:   07/18/22 0547  TempSrc: Oral  PainSc:                  Louann Sjogren

## 2022-07-20 LAB — SURGICAL PATHOLOGY

## 2022-07-23 ENCOUNTER — Encounter (HOSPITAL_COMMUNITY): Payer: Self-pay | Admitting: General Surgery

## 2022-08-13 ENCOUNTER — Ambulatory Visit (INDEPENDENT_AMBULATORY_CARE_PROVIDER_SITE_OTHER): Payer: Medicare Other | Admitting: General Surgery

## 2022-08-13 ENCOUNTER — Encounter: Payer: Self-pay | Admitting: General Surgery

## 2022-08-13 VITALS — BP 138/78 | HR 42 | Temp 97.6°F | Resp 12 | Ht 67.0 in | Wt 168.0 lb

## 2022-08-13 DIAGNOSIS — K4090 Unilateral inguinal hernia, without obstruction or gangrene, not specified as recurrent: Secondary | ICD-10-CM

## 2022-08-13 NOTE — Patient Instructions (Signed)
No heavy lifting > 10 lbs, excessive bending, pushing, pulling, or squatting for 6-8 weeks after surgery.  Diet as tolerated. Follow up as needed. The hardness/ induration in the area of the hernia repair with improve with time.

## 2022-08-13 NOTE — Progress Notes (Signed)
[facility]  Doing well. Eating. Having Bms. Says no pain.  BP 138/78   Pulse (!) 42   Temp 97.6 F (36.4 C) (Oral)   Resp 12   Ht '5\' 7"'$  (1.702 m)   Wt 168 lb (76.2 kg)   SpO2 97%   BMI 26.31 kg/m  Right inguinal hernia repair healing, some minor induration and shelf like edge but no hernia recurrence.  Patient s/p direct hernia repair with mesh; doing well.   No heavy lifting > 10 lbs, excessive bending, pushing, pulling, or squatting for 6-8 weeks after surgery.  Diet as tolerated. Follow up as needed. The hardness/ induration in the area of the hernia repair with improve with time.   Curlene Labrum, MD [facility] [address] [place], [place] [address] [phone] (office)

## 2022-10-28 ENCOUNTER — Encounter (HOSPITAL_COMMUNITY): Payer: Self-pay | Admitting: Emergency Medicine

## 2022-10-28 ENCOUNTER — Other Ambulatory Visit: Payer: Self-pay

## 2022-10-28 ENCOUNTER — Emergency Department (HOSPITAL_COMMUNITY)
Admission: EM | Admit: 2022-10-28 | Discharge: 2022-10-28 | Disposition: A | Discharge status: Home / Self Care | Payer: Medicare Other | Attending: Emergency Medicine | Admitting: Emergency Medicine

## 2022-10-28 DIAGNOSIS — I10 Essential (primary) hypertension: Secondary | ICD-10-CM | POA: Diagnosis not present

## 2022-10-28 DIAGNOSIS — Z79899 Other long term (current) drug therapy: Secondary | ICD-10-CM | POA: Diagnosis not present

## 2022-10-28 DIAGNOSIS — R1032 Left lower quadrant pain: Secondary | ICD-10-CM | POA: Diagnosis present

## 2022-10-28 DIAGNOSIS — K4030 Unilateral inguinal hernia, with obstruction, without gangrene, not specified as recurrent: Secondary | ICD-10-CM | POA: Diagnosis not present

## 2022-10-28 LAB — COMPREHENSIVE METABOLIC PANEL
ALT: 13 U/L (ref 0–44)
AST: 17 U/L (ref 15–41)
Albumin: 4.1 g/dL (ref 3.5–5.0)
Alkaline Phosphatase: 62 U/L (ref 38–126)
Anion gap: 8 (ref 5–15)
BUN: 18 mg/dL (ref 8–23)
CO2: 25 mmol/L (ref 22–32)
Calcium: 8.8 mg/dL — ABNORMAL LOW (ref 8.9–10.3)
Chloride: 102 mmol/L (ref 98–111)
Creatinine, Ser: 1.56 mg/dL — ABNORMAL HIGH (ref 0.61–1.24)
GFR, Estimated: 48 mL/min — ABNORMAL LOW (ref >60)
Glucose, Bld: 126 mg/dL — ABNORMAL HIGH (ref 70–99)
Potassium: 4.1 mmol/L (ref 3.5–5.1)
Sodium: 135 mmol/L (ref 135–145)
Total Bilirubin: 1 mg/dL (ref 0.3–1.2)
Total Protein: 7.1 g/dL (ref 6.5–8.1)

## 2022-10-28 LAB — CBC WITH DIFFERENTIAL/PLATELET
Abs Immature Granulocytes: 0.01 10*3/uL (ref 0.00–0.07)
Basophils Absolute: 0.1 10*3/uL (ref 0.0–0.1)
Basophils Relative: 2 %
Eosinophils Absolute: 0.6 10*3/uL — ABNORMAL HIGH (ref 0.0–0.5)
Eosinophils Relative: 13 %
HCT: 44.3 % (ref 39.0–52.0)
Hemoglobin: 14.6 g/dL (ref 13.0–17.0)
Immature Granulocytes: 0 %
Lymphocytes Relative: 26 %
Lymphs Abs: 1.2 10*3/uL (ref 0.7–4.0)
MCH: 27.4 pg (ref 26.0–34.0)
MCHC: 33 g/dL (ref 30.0–36.0)
MCV: 83.1 fL (ref 80.0–100.0)
Monocytes Absolute: 0.5 10*3/uL (ref 0.1–1.0)
Monocytes Relative: 11 %
Neutro Abs: 2.3 10*3/uL (ref 1.7–7.7)
Neutrophils Relative %: 48 %
Platelets: 151 10*3/uL (ref 150–400)
RBC: 5.33 MIL/uL (ref 4.22–5.81)
RDW: 14.1 % (ref 11.5–15.5)
WBC: 4.7 10*3/uL (ref 4.0–10.5)
nRBC: 0 % (ref 0.0–0.2)

## 2022-10-28 MED ORDER — HYDROMORPHONE HCL 1 MG/ML IJ SOLN
1.0000 mg | Freq: Once | INTRAMUSCULAR | Status: AC
  Administered 2022-10-28: 1 mg via INTRAVENOUS
  Filled 2022-10-28: qty 1

## 2022-10-28 MED ORDER — LORAZEPAM 2 MG/ML IJ SOLN
0.5000 mg | Freq: Once | INTRAMUSCULAR | Status: AC
  Administered 2022-10-28: 0.5 mg via INTRAVENOUS
  Filled 2022-10-28: qty 1

## 2022-10-28 MED ORDER — SODIUM CHLORIDE 0.9 % IV BOLUS
500.0000 mL | Freq: Once | INTRAVENOUS | Status: AC
  Administered 2022-10-28: 500 mL via INTRAVENOUS

## 2022-10-28 NOTE — Discharge Instructions (Signed)
Follow-up as instructed by Dr. Arnoldo Morale.  Take Tylenol or Motrin for pain

## 2022-10-28 NOTE — Consult Note (Signed)
Reason for Consult: Incarcerated left inguinal hernia Referring Physician: Dr. Rosalyn Gess is an 67 y.o. Timothy [name].  HPI: Patient is a Timothy Timothy [name] with multiple medical problems who presents with an incarcerated left inguinal hernia.  He states this is his second episode of having incarceration.  An attempt to reduce it was done in the emergency room but was unsuccessful.  Patient denies any nausea or vomiting.  He is status post a right inguinal herniorrhaphy with mesh for incarceration by Dr. Constance Haw in December 2023.  He denies any nausea or vomiting.  Past Medical History:  Diagnosis Date   Alcohol abuse    CHF (congestive heart failure) (HCC)    CKD (chronic kidney disease) stage 3, GFR 30-59 ml/min (HCC)    Hyperlipidemia    Hypertension    Noncompliance     Past Surgical History:  Procedure Laterality Date   INGUINAL HERNIA REPAIR Right 07/17/2022   Procedure: HERNIA REPAIR INGUINAL INCARCERATED;  Surgeon: Virl Cagey, MD;  Location: [facility];  Service: General;  Laterality: Right;    Family History  Problem Relation Age of Onset   Diabetes Mother    Hypertension Mother    Cancer Father        Prostate Cancer   Hypertension Father    Diabetes Sister    Diabetes Brother    Hypertension Brother    Hypertension Brother     Social History:  reports that he has quit smoking. His smoking use included cigars. He has never used smokeless tobacco. He reports current alcohol use of about 7.0 standard drinks of alcohol per week. He reports current drug use. Drug: Cocaine.  Allergies: No Known Allergies  Medications: I have reviewed the patient's current medications.  Results for orders placed or performed during the hospital encounter of 10/28/22 (from the past 48 hour(s))  CBC with Differential     Status: Abnormal   Collection Time: 10/28/22  4:40 PM  Result Value Ref Range   WBC 4.7 4.0 - 10.5 K/uL   RBC 5.33 4.22 - 5.81 MIL/uL   Hemoglobin 14.6  13.0 - 17.0 g/dL   HCT 44.3 39.0 - 52.0 %   MCV 83.1 80.0 - 100.0 fL   MCH 27.4 26.0 - 34.0 pg   MCHC 33.0 30.0 - 36.0 g/dL   RDW 14.1 11.5 - 15.5 %   Platelets 151 150 - 400 K/uL   nRBC 0.0 0.0 - 0.2 %   Neutrophils Relative % 48 %   Neutro Abs 2.3 1.7 - 7.7 K/uL   Lymphocytes Relative 26 %   Lymphs Abs 1.2 0.7 - 4.0 K/uL   Monocytes Relative 11 %   Monocytes Absolute 0.5 0.1 - 1.0 K/uL   Eosinophils Relative 13 %   Eosinophils Absolute 0.6 (H) 0.0 - 0.5 K/uL   Basophils Relative 2 %   Basophils Absolute 0.1 0.0 - 0.1 K/uL   Immature Granulocytes 0 %   Abs Immature Granulocytes 0.01 0.00 - 0.07 K/uL    Comment: Performed at [facility], [address] [place], [place] [address]  Comprehensive metabolic panel     Status: Abnormal   Collection Time: 10/28/22  4:40 PM  Result Value Ref Range   Sodium 135 135 - 145 mmol/L   Potassium 4.1 3.5 - 5.1 mmol/L   Chloride 102 98 - 111 mmol/L   CO2 25 22 - 32 mmol/L   Glucose, Bld 126 (H) 70 - 99 mg/dL    Comment:  Glucose reference range applies only to samples taken after fasting for at least 8 hours.   BUN 18 8 - 23 mg/dL   Creatinine, Ser 1.56 (H) 0.61 - 1.24 mg/dL   Calcium 8.8 (L) 8.9 - 10.3 mg/dL   Total Protein 7.1 6.5 - 8.1 g/dL   Albumin 4.1 3.5 - 5.0 g/dL   AST 17 15 - 41 U/L   ALT 13 0 - 44 U/L   Alkaline Phosphatase 62 38 - 126 U/L   Total Bilirubin 1.0 0.3 - 1.2 mg/dL   GFR, Estimated 48 (L) >60 mL/min    Comment: (NOTE) Calculated using the CKD-EPI Creatinine Equation (2021)    Anion gap 8 5 - 15    Comment: Performed at [facility], [address] [place], [place] [address]    No results found.  ROS:  Pertinent items are noted in HPI.  Blood pressure (!) 165/87, pulse 61, temperature 98.1 F (36.7 C), temperature source Oral, resp. rate 18, weight 76 kg, SpO2 100 %. Physical Exam: Anxious black Timothy [name]. Abdomen is soft, nontender, nondistended.  Incarcerated left inguinal hernia is present.  This was reduced  at bedside with the patient in Trendelenburg position.  Assessment/Plan: Impression: Incarcerated left inguinal hernia, reduced Plan: Patient may be discharged home.  He should follow-up in my office for elective repair of his inguinal hernia.  Instructions were given on how to reduce his hernia at home.  Aviva Signs 10/28/2022, 6:11 PM

## 2022-10-28 NOTE — ED Provider Notes (Signed)
[facility] Provider Note   CSN: [id number] Arrival date & time: 10/28/22  1550     History {Add pertinent medical, surgical, social history, OB history to HPI:1} Chief Complaint  Patient presents with   Inguinal Hernia    Timothy [name] is a 67 y.o. male.  Patient complains of left inguinal pain.  He has a history of a right inguinal hernia repair   Abdominal Pain      Home Medications Prior to Admission medications   Medication Sig Start Date End Date Taking? Authorizing Provider  amLODipine (NORVASC) 10 MG tablet Take 1 tablet (10 mg total) by mouth daily for 30 days. 12/24/18 08/13/22  Arrien, Jimmy Picket, MD  carvedilol (COREG) 25 MG tablet Take 1 tablet (25 mg total) by mouth 2 (two) times daily with a meal for 30 days. 12/23/18 08/13/22  Arrien, Jimmy Picket, MD  cholecalciferol (VITAMIN D3) 25 MCG (1000 UNIT) tablet Take 1,000 Units by mouth daily.    [provider]  furosemide (LASIX) 20 MG tablet Take 20 mg by mouth daily as needed for fluid. Patient not taking: Reported on 08/13/2022 02/06/22   [provider]  hydrALAZINE (APRESOLINE) 50 MG tablet Take 1 tablet (50 mg total) by mouth 3 (three) times daily. 07/04/21 08/13/22  Geralynn Rile, MD  isosorbide mononitrate (IMDUR) 60 MG 24 hr tablet TAKE ONE TABLET BY MOUTH ONCE DAILY. 04/02/22   Jerline Pain, MD  pravastatin (PRAVACHOL) 20 MG tablet Take 20 mg by mouth daily. 06/25/22   [provider]      Allergies    Patient has no known allergies.    Review of Systems   Review of Systems  Gastrointestinal:  Positive for abdominal pain.    Physical Exam Updated Vital Signs BP (!) 165/87 (BP Location: Right Arm)   Pulse 61   Temp 98.1 F (36.7 C) (Oral)   Resp 18   Wt 76 kg   SpO2 100%   BMI 26.24 kg/m  Physical Exam  ED Results / Procedures / Treatments   Labs (all labs ordered are listed, but only abnormal results  are displayed) Labs Reviewed  CBC WITH DIFFERENTIAL/PLATELET - Abnormal; Notable for the following components:      Result Value   Eosinophils Absolute 0.6 (*)    All other components within normal limits  COMPREHENSIVE METABOLIC PANEL - Abnormal; Notable for the following components:   Glucose, Bld 126 (*)    Creatinine, Ser 1.56 (*)    Calcium 8.8 (*)    GFR, Estimated 48 (*)    All other components within normal limits    EKG None  Radiology No results found.  Procedures Procedures  {Document cardiac monitor, telemetry assessment procedure when appropriate:1}  Medications Ordered in ED Medications  HYDROmorphone (DILAUDID) injection 1 mg (1 mg Intravenous Given 10/28/22 1657)  LORazepam (ATIVAN) injection 0.5 mg (0.5 mg Intravenous Given 10/28/22 1657)  sodium chloride 0.9 % bolus 500 mL (0 mLs Intravenous Stopped 10/28/22 1730)  HYDROmorphone (DILAUDID) injection 1 mg (1 mg Intravenous Given 10/28/22 1718)  LORazepam (ATIVAN) injection 0.5 mg (0.5 mg Intravenous Given 10/28/22 1717)    ED Course/ Medical Decision Making/ A&P   {Patient with an incarcerated left inguinal hernia that I was unable to reduce.  I called Dr. Arnoldo Morale general surgery and he was able to reduce the hernia Click here for ABCD2, HEART and other calculatorsREFRESH Note before signing :1}  Medical Decision Making Amount and/or Complexity of Data Reviewed Labs: ordered.  Risk Prescription drug management.  Left inguinal hernia  {Document critical care time when appropriate:1} {Document review of labs and clinical decision tools ie heart score, Chads2Vasc2 etc:1}  {Document your independent review of radiology images, and any outside records:1} {Document your discussion with family members, caretakers, and with consultants:1} {Document social determinants of health affecting pt's care:1} {Document your decision making why or why not admission, treatments were  needed:1} Final Clinical Impression(s) / ED Diagnoses Final diagnoses:  Incarcerated right inguinal hernia    Rx / DC Orders ED Discharge Orders     None

## 2022-10-28 NOTE — ED Triage Notes (Signed)
Hernia present for 3 months and pain just started today. Denies any injury. No meds taken PTA. Pain worse with lying down.

## 2023-03-14 ENCOUNTER — Other Ambulatory Visit: Payer: Self-pay

## 2023-03-14 ENCOUNTER — Encounter (HOSPITAL_COMMUNITY): Payer: Self-pay | Admitting: Emergency Medicine

## 2023-03-14 ENCOUNTER — Emergency Department (HOSPITAL_COMMUNITY)
Admission: EM | Admit: 2023-03-14 | Discharge: 2023-03-14 | Disposition: A | Discharge status: Home / Self Care | Payer: Medicare PPO | Attending: Emergency Medicine | Admitting: Emergency Medicine

## 2023-03-14 DIAGNOSIS — I10 Essential (primary) hypertension: Secondary | ICD-10-CM | POA: Insufficient documentation

## 2023-03-14 DIAGNOSIS — Z79899 Other long term (current) drug therapy: Secondary | ICD-10-CM | POA: Insufficient documentation

## 2023-03-14 DIAGNOSIS — K4030 Unilateral inguinal hernia, with obstruction, without gangrene, not specified as recurrent: Secondary | ICD-10-CM | POA: Diagnosis not present

## 2023-03-14 DIAGNOSIS — K4091 Unilateral inguinal hernia, without obstruction or gangrene, recurrent: Secondary | ICD-10-CM | POA: Insufficient documentation

## 2023-03-14 LAB — CBC WITH DIFFERENTIAL/PLATELET
Abs Immature Granulocytes: 0.02 10*3/uL (ref 0.00–0.07)
Basophils Absolute: 0.1 10*3/uL (ref 0.0–0.1)
Basophils Relative: 2 %
Eosinophils Absolute: 0.3 10*3/uL (ref 0.0–0.5)
Eosinophils Relative: 4 %
HCT: 48 % (ref 39.0–52.0)
Hemoglobin: 15.8 g/dL (ref 13.0–17.0)
Immature Granulocytes: 0 %
Lymphocytes Relative: 17 %
Lymphs Abs: 1.1 10*3/uL (ref 0.7–4.0)
MCH: 27.4 pg (ref 26.0–34.0)
MCHC: 32.9 g/dL (ref 30.0–36.0)
MCV: 83.3 fL (ref 80.0–100.0)
Monocytes Absolute: 0.3 10*3/uL (ref 0.1–1.0)
Monocytes Relative: 5 %
Neutro Abs: 4.5 10*3/uL (ref 1.7–7.7)
Neutrophils Relative %: 72 %
Platelets: 150 10*3/uL (ref 150–400)
RBC: 5.76 MIL/uL (ref 4.22–5.81)
RDW: 14.3 % (ref 11.5–15.5)
WBC: 6.3 10*3/uL (ref 4.0–10.5)
nRBC: 0 % (ref 0.0–0.2)

## 2023-03-14 LAB — BASIC METABOLIC PANEL
Anion gap: 10 (ref 5–15)
BUN: 25 mg/dL — ABNORMAL HIGH (ref 8–23)
CO2: 23 mmol/L (ref 22–32)
Calcium: 9.2 mg/dL (ref 8.9–10.3)
Chloride: 102 mmol/L (ref 98–111)
Creatinine, Ser: 1.58 mg/dL — ABNORMAL HIGH (ref 0.61–1.24)
GFR, Estimated: 47 mL/min — ABNORMAL LOW (ref >60)
Glucose, Bld: 167 mg/dL — ABNORMAL HIGH (ref 70–99)
Potassium: 4.5 mmol/L (ref 3.5–5.1)
Sodium: 135 mmol/L (ref 135–145)

## 2023-03-14 MED ORDER — HYDRALAZINE HCL 20 MG/ML IJ SOLN
10.0000 mg | Freq: Once | INTRAMUSCULAR | Status: AC
  Administered 2023-03-14: 10 mg via INTRAVENOUS
  Filled 2023-03-14: qty 1

## 2023-03-14 MED ORDER — HYDROMORPHONE HCL 1 MG/ML IJ SOLN
1.0000 mg | Freq: Once | INTRAMUSCULAR | Status: AC
  Administered 2023-03-14: 1 mg via INTRAVENOUS
  Filled 2023-03-14: qty 1

## 2023-03-14 NOTE — Consult Note (Signed)
Reason for Consult: Incarcerated left inguinal hernia Referring Physician: Meridee Score, MD  Timothy [name] is an 67 y.o. male.  HPI: Patient with previous right inguinal hernia repair with mesh (Dec 2023) and recurrent episodes of left inguinal hernia incarceration presenting with left groin pain since yesterday. He says that the pain is what brought him to the ED. He has also had a few episodes of vomiting since last night. He notes that there is a chronic bulge in the area that is always protruding. However, he states that this is the biggest the hernia has ever gotten. He denies any abdominal pain. He says his last bowel movement was this morning.  Past Medical History:  Diagnosis Date   Alcohol abuse    CHF (congestive heart failure) (HCC)    CKD (chronic kidney disease) stage 3, GFR 30-59 ml/min (HCC)    Hyperlipidemia    Hypertension    Noncompliance     Past Surgical History:  Procedure Laterality Date   INGUINAL HERNIA REPAIR Right 07/17/2022   Procedure: HERNIA REPAIR INGUINAL INCARCERATED;  Surgeon: Lucretia Roers, MD;  Location: [facility];  Service: General;  Laterality: Right;    Family History  Problem Relation Age of Onset   Diabetes Mother    Hypertension Mother    Cancer Father        Prostate Cancer   Hypertension Father    Diabetes Sister    Diabetes Brother    Hypertension Brother    Hypertension Brother     Social History:  reports that he has quit smoking. His smoking use included cigars. He has never used smokeless tobacco. He reports current alcohol use of about 7.0 standard drinks of alcohol per week. He reports current drug use. Drug: Cocaine.  Allergies: No Known Allergies  Medications: Prior to Admission: (Not in a hospital admission)  Results for orders placed or performed during the hospital encounter of 03/14/23 (from the past 48 hour(s))  CBC with Differential     Status: None   Collection Time: 03/14/23 10:35 AM  Result Value Ref  Range   WBC 6.3 4.0 - 10.5 K/uL   RBC 5.76 4.22 - 5.81 MIL/uL   Hemoglobin 15.8 13.0 - 17.0 g/dL   HCT 86.5 78.4 - 69.6 %   MCV 83.3 80.0 - 100.0 fL   MCH 27.4 26.0 - 34.0 pg   MCHC 32.9 30.0 - 36.0 g/dL   RDW 29.5 28.4 - 13.2 %   Platelets 150 150 - 400 K/uL   nRBC 0.0 0.0 - 0.2 %   Neutrophils Relative % 72 %   Neutro Abs 4.5 1.7 - 7.7 K/uL   Lymphocytes Relative 17 %   Lymphs Abs 1.1 0.7 - 4.0 K/uL   Monocytes Relative 5 %   Monocytes Absolute 0.3 0.1 - 1.0 K/uL   Eosinophils Relative 4 %   Eosinophils Absolute 0.3 0.0 - 0.5 K/uL   Basophils Relative 2 %   Basophils Absolute 0.1 0.0 - 0.1 K/uL   Immature Granulocytes 0 %   Abs Immature Granulocytes 0.02 0.00 - 0.07 K/uL    Comment: Performed at [facility], [address], [place], [place] [address]  Basic metabolic panel     Status: Abnormal   Collection Time: 03/14/23 10:35 AM  Result Value Ref Range   Sodium 135 135 - 145 mmol/L   Potassium 4.5 3.5 - 5.1 mmol/L   Chloride 102 98 - 111 mmol/L   CO2 23 22 - 32 mmol/L  Glucose, Bld 167 (H) 70 - 99 mg/dL    Comment: Glucose reference range applies only to samples taken after fasting for at least 8 hours.   BUN 25 (H) 8 - 23 mg/dL   Creatinine, Ser 9.52 (H) 0.61 - 1.24 mg/dL   Calcium 9.2 8.9 - 84.1 mg/dL   GFR, Estimated 47 (L) >60 mL/min    Comment: (NOTE) Calculated using the CKD-EPI Creatinine Equation (2021)    Anion gap 10 5 - 15    Comment: Performed at [facility], [address] [place], [place] [address]    No results found.  ROS:  Pertinent items are noted in HPI.  Blood pressure (!) 199/94, pulse 60, temperature 98.5 F (36.9 C), resp. rate 20, height 5\' 7"  (1.702 m), weight 77.1 kg, SpO2 98%. Physical Exam:  General: pleasant and well-appearing CV: RRR, no murmurs Pulm: no increased work of breathing Abdominal: Soft, nondistended. No tenderness to palpation, rebound tenderness or guarding. Groin: Large left inguinal bulge with associated  tenderness to palpation. Testicles present in scrotum. Psych: A&O x3   Assessment/Plan: Timothy [name] is a 67 year old man with medical history significant for hypertension, hyperlipidemia, CKD stage IIIa, GERD, right inguinal hernia repair with mesh by Dr. Henreitta Leber in Dec 2023, and multiple episodes of incarcerated left inguinal hernia presenting with two days of pain and vomiting consistent with another left inguinal hernia incarceration episode. His last episode requiring an ED visit was in March 2024 and it was reduced by Dr. Lovell Sheehan in the ED. Patient was discharged home and advised to follow up in office for scheduling of left inguinal hernia repair but the patient never followed up.   General surgery was consulted for reduction of hernia vs. urgent OR. Reduction was attempted soon after consult but the patient complaining of too much pain. A 10 pound weight place don the area was recommended for a couple of hours and better pain control. Afterwards the area was slightly improved and less enlarged. Patient will continue to have this problem unless surgical repair is pursued. This was discussed with the patient. Labs unremarkable and patient stable. Feel that it is appropriate for him to be discharged with outpatient follow up to schedule left inguinal hernia repair, most likely with mesh.  - Patient will follow up with general surgery in the office this week - Return precautions provided to patient (intractable vomiting or severe pain)   Dominica Severin 03/14/2023, 11:28 AM

## 2023-03-14 NOTE — ED Triage Notes (Signed)
C/o left inguinal pain/abd pain with n/v x1 day after mowing grass.  Bulge noted in left inguinal area with tenderness.  Hx of right inguinal hernia repair.

## 2023-03-14 NOTE — ED Provider Notes (Addendum)
[facility] Provider Note   CSN: [id number] Arrival date & time: 03/14/23  4098     History  Chief Complaint  Patient presents with   Inguinal Hernia    Timothy [name] is a 67 y.o. male.  67 year old male with multiple medical problems including hypertension and high cholesterol.  Presents to the ER for left little bulging since yesterday.  This occurred while mowing the lawn.  Today's having had a 10 pain.  Is had this before with incarcerated hernia that was ultimately reduced by the general surgeon in the ED.  Denies nausea or vomiting, no fevers.  HPI     Home Medications Prior to Admission medications   Medication Sig Start Date End Date Taking? Authorizing Provider  amLODipine (NORVASC) 10 MG tablet Take 1 tablet (10 mg total) by mouth daily for 30 days. 12/24/18 08/13/22  Arrien, York Ram, MD  carvedilol (COREG) 25 MG tablet Take 1 tablet (25 mg total) by mouth 2 (two) times daily with a meal for 30 days. 12/23/18 08/13/22  Arrien, York Ram, MD  cholecalciferol (VITAMIN D3) 25 MCG (1000 UNIT) tablet Take 1,000 Units by mouth daily.    [provider]  furosemide (LASIX) 20 MG tablet Take 20 mg by mouth daily as needed for fluid. Patient not taking: Reported on 08/13/2022 02/06/22   [provider]  hydrALAZINE (APRESOLINE) 50 MG tablet Take 1 tablet (50 mg total) by mouth 3 (three) times daily. 07/04/21 08/13/22  Sande Rives, MD  isosorbide mononitrate (IMDUR) 60 MG 24 hr tablet TAKE ONE TABLET BY MOUTH ONCE DAILY. 04/02/22   Jake Bathe, MD  pravastatin (PRAVACHOL) 20 MG tablet Take 20 mg by mouth daily. 06/25/22   [provider]      Allergies    Patient has no known allergies.    Review of Systems   Review of Systems  Physical Exam Updated Vital Signs BP (!) 157/101   Pulse (!) 58   Temp 98.5 F (36.9 C)   Resp 20   Ht 5\' 7"  (1.702 m)   Wt 77.1 kg   SpO2 98%   BMI  26.63 kg/m  Physical Exam Vitals and nursing note reviewed. Exam conducted with a chaperone present.  Constitutional:      General: He is not in acute distress.    Appearance: He is well-developed.  HENT:     Head: Normocephalic and atraumatic.     Mouth/Throat:     Mouth: Mucous membranes are moist.  Eyes:     Conjunctiva/sclera: Conjunctivae normal.  Cardiovascular:     Rate and Rhythm: Normal rate and regular rhythm.     Heart sounds: No murmur heard. Pulmonary:     Effort: Pulmonary effort is normal. No respiratory distress.     Breath sounds: Normal breath sounds.  Abdominal:     Palpations: Abdomen is soft.     Tenderness: There is no abdominal tenderness.  Genitourinary:    Comments: To left inguinal area.  There is tenderness and bulging to left testicle and left pubic area.  No crepitus. Musculoskeletal:        General: No swelling.     Cervical back: Neck supple.  Skin:    General: Skin is warm and dry.     Capillary Refill: Capillary refill takes less than 2 seconds.  Neurological:     General: No focal deficit present.     Mental Status: He is alert and  oriented to person, place, and time.  Psychiatric:        Mood and Affect: Mood normal.     ED Results / Procedures / Treatments   Labs (all labs ordered are listed, but only abnormal results are displayed) Labs Reviewed  BASIC METABOLIC PANEL - Abnormal; Notable for the following components:      Result Value   Glucose, Bld 167 (*)    BUN 25 (*)    Creatinine, Ser 1.58 (*)    GFR, Estimated 47 (*)    All other components within normal limits  CBC WITH DIFFERENTIAL/PLATELET    EKG None  Radiology No results found.  Procedures Procedures    Medications Ordered in ED Medications  HYDROmorphone (DILAUDID) injection 1 mg (1 mg Intravenous Given 03/14/23 1039)  HYDROmorphone (DILAUDID) injection 1 mg (1 mg Intravenous Given 03/14/23 1305)  hydrALAZINE (APRESOLINE) injection 10 mg (10 mg  Intravenous Given 03/14/23 1439)    ED Course/ Medical Decision Making/ A&P Clinical Course as of 03/14/23 1459  Sun Mar 14, 2023  5525 67 year old male complaining of left hernia pain and bulge for the last couple of days.  He is quite tender on exam and has fullness through his left scrotum.  Getting labs, pain management and will attempt to reduce at bedside. [MB]    Clinical Course User Index [MB] Terrilee Files, MD                                 Medical Decision Making Dx: Incarcerated hernia, bowel obstruction, strangulated hernia, other ED course: Patient comes in with left-sided inguinal hernia that is bulging out, started yesterday after mowing the grass.  States pain has gotten gradually worse and is tender today.  Denies fever or chills, no nausea or vomiting.  Had this before and had to be reduced by general surgery in the ED.  Patient was given pain control applied ice and put in Trendelenburg, attempted reduction without relief.  Dr. Lovell Sheehan the general surgeon was consulted and was unable to reduce as well, recommend putting weight on the area.  This was done and patient did have some improvement, he states he is feeling much better.  The area is now soft, he was reexamined by Dr. Lovell Sheehan who wants to have him follow-up in the facet 9 AM Tuesday to schedule surgery for this recurrent hernia  Also noted to be very hypertensive in the ER, he believes he took his medications this morning but not sure.  Given a dose of IV hydralazine with improvement.  He is not having chest pain, shortness of breath, dizziness or any symptoms associated with this, no numbness tingling or weakness.  Advised on close PCP follow-up for blood pressure recheck.  Encourage compliance with medications at home.  May be secondary to his use of substances as documented in the chart.  Amount and/or Complexity of Data Reviewed Labs: ordered.  Risk Prescription drug management.           Final  Clinical Impression(s) / ED Diagnoses Final diagnoses:  Unilateral recurrent inguinal hernia without obstruction or gangrene  Hypertension, unspecified type    Rx / DC Orders ED Discharge Orders     None         Ma Rings, PA-C 03/14/23 1459    Ma Rings, PA-C 03/14/23 1459    Eber Hong, MD 03/14/23 1513

## 2023-03-14 NOTE — Discharge Instructions (Addendum)
Today for a large hernia in your left inguinal area.  You were seen by the general surgeon.  He wants to see you in the office on Tuesday at 9 AM.  Come back if you have new or worsening symptoms as discussed.  Your blood pressure was also very high, you were given some medication through IV to make this a little bit better make sure you take your medicines regularly and call your primary care doctor tomorrow to schedule a close blood pressure recheck.

## 2023-03-14 NOTE — ED Notes (Signed)
4lbs weights still in position over hernia.

## 2023-03-16 ENCOUNTER — Ambulatory Visit (INDEPENDENT_AMBULATORY_CARE_PROVIDER_SITE_OTHER): Payer: Medicare PPO | Admitting: General Surgery

## 2023-03-16 ENCOUNTER — Encounter: Payer: Self-pay | Admitting: General Surgery

## 2023-03-16 VITALS — BP 144/104 | HR 80 | Temp 97.7°F | Resp 16 | Ht 67.0 in | Wt 152.0 lb

## 2023-03-16 DIAGNOSIS — K4030 Unilateral inguinal hernia, with obstruction, without gangrene, not specified as recurrent: Secondary | ICD-10-CM

## 2023-03-16 NOTE — Progress Notes (Signed)
Timothy [name]; [id number]; 04-24-1956   HPI Patient is a 67 year old black male who is following up for his incarcerated left inguinal hernia.  I saw him in the emergency room on 03/14/2023 for incarceration of his left inguinal hernia.  He chronically has a left inguinal hernia that does stick out.  It was sticking out more at the time he presented to the emergency room but has since retracted back.  He now presents for scheduling of his surgery.  He denies any significant nausea or vomiting.  He is having normal bowel movements.  He denies any fever or chills. Past Medical History:  Diagnosis Date   Alcohol abuse    CHF (congestive heart failure) (HCC)    CKD (chronic kidney disease) stage 3, GFR 30-59 ml/min (HCC)    Hyperlipidemia    Hypertension    Noncompliance     Past Surgical History:  Procedure Laterality Date   INGUINAL HERNIA REPAIR Right 07/17/2022   Procedure: HERNIA REPAIR INGUINAL INCARCERATED;  Surgeon: Lucretia Roers, MD;  Location: [facility];  Service: General;  Laterality: Right;    Family History  Problem Relation Age of Onset   Diabetes Mother    Hypertension Mother    Cancer Father        Prostate Cancer   Hypertension Father    Diabetes Sister    Diabetes Brother    Hypertension Brother    Hypertension Brother     Current Outpatient Medications on File Prior to Visit  Medication Sig Dispense Refill   carvedilol (COREG) 25 MG tablet Take 1 tablet (25 mg total) by mouth 2 (two) times daily with a meal for 30 days. 60 tablet 0   cholecalciferol (VITAMIN D3) 25 MCG (1000 UNIT) tablet Take 1,000 Units by mouth daily.     hydrALAZINE (APRESOLINE) 50 MG tablet Take 1 tablet (50 mg total) by mouth 3 (three) times daily. 270 tablet 3   isosorbide mononitrate (IMDUR) 60 MG 24 hr tablet TAKE ONE TABLET BY MOUTH ONCE DAILY. 90 tablet 1   pravastatin (PRAVACHOL) 20 MG tablet Take 20 mg by mouth daily.     amLODipine (NORVASC) 10 MG tablet Take 1 tablet (10 mg  total) by mouth daily for 30 days. (Patient not taking: Reported on 03/16/2023) 30 tablet 0   furosemide (LASIX) 20 MG tablet Take 20 mg by mouth daily as needed for fluid. (Patient not taking: Reported on 08/13/2022)     No current facility-administered medications on file prior to visit.    No Known Allergies  Social History   Substance and Sexual Activity  Alcohol Use Yes   Alcohol/week: 7.0 standard drinks of alcohol   Types: 7 Cans of beer per week   Comment: daily, 40oz beer    Social History   Tobacco Use  Smoking Status Former   Types: Cigars  Smokeless Tobacco Never    Review of Systems  Constitutional: Negative.   HENT: Negative.    Eyes: Negative.   Respiratory: Negative.    Cardiovascular: Negative.   Gastrointestinal:  Positive for abdominal pain.  Genitourinary: Negative.   Musculoskeletal: Negative.   Skin: Negative.   Neurological: Negative.   Endo/Heme/Allergies: Negative.   Psychiatric/Behavioral: Negative.      Objective   Vitals:   03/16/23 0917  BP: (!) 144/104  Pulse: 80  Resp: 16  Temp: 97.7 F (36.5 C)  SpO2: 96%    Physical Exam Vitals reviewed.  Constitutional:      Appearance: Normal  appearance. He is normal weight. He is not ill-appearing.  HENT:     Head: Normocephalic and atraumatic.  Cardiovascular:     Rate and Rhythm: Normal rate and regular rhythm.     Heart sounds: Normal heart sounds. No murmur heard.    No friction rub. No gallop.  Pulmonary:     Effort: Pulmonary effort is normal. No respiratory distress.     Breath sounds: Normal breath sounds. No stridor. No wheezing, rhonchi or rales.  Abdominal:     General: Abdomen is flat. Bowel sounds are normal. There is no distension.     Palpations: There is no mass.     Tenderness: There is no abdominal tenderness. There is no guarding or rebound.     Hernia: A hernia is present.     Comments: Large protruding left inguinal hernia.  Contents are soft and nontender.   No erythema noted.  Genitourinary:    Testes: Normal.  Skin:    General: Skin is warm and dry.  Neurological:     Mental Status: He is alert and oriented to person, place, and time.    ER notes reviewed Assessment  Incarcerated left inguinal hernia Plan  Patient is scheduled for a robotic assisted laparoscopic incarcerated left inguinal herniorrhaphy with mesh on 03/19/2023.  The risks and benefits of the procedure including bleeding, infection, recurrence, and the possibility of an open procedure were fully explained to the patient, who gave informed consent.

## 2023-03-16 NOTE — H&P (Signed)
Timothy [name]; [id number]; 04-24-1956   HPI Patient is a 67 year old black male who is following up for his incarcerated left inguinal hernia.  I saw him in the emergency room on 03/14/2023 for incarceration of his left inguinal hernia.  He chronically has a left inguinal hernia that does stick out.  It was sticking out more at the time he presented to the emergency room but has since retracted back.  He now presents for scheduling of his surgery.  He denies any significant nausea or vomiting.  He is having normal bowel movements.  He denies any fever or chills. Past Medical History:  Diagnosis Date   Alcohol abuse    CHF (congestive heart failure) (HCC)    CKD (chronic kidney disease) stage 3, GFR 30-59 ml/min (HCC)    Hyperlipidemia    Hypertension    Noncompliance     Past Surgical History:  Procedure Laterality Date   INGUINAL HERNIA REPAIR Right 07/17/2022   Procedure: HERNIA REPAIR INGUINAL INCARCERATED;  Surgeon: Lucretia Roers, MD;  Location: [facility];  Service: General;  Laterality: Right;    Family History  Problem Relation Age of Onset   Diabetes Mother    Hypertension Mother    Cancer Father        Prostate Cancer   Hypertension Father    Diabetes Sister    Diabetes Brother    Hypertension Brother    Hypertension Brother     Current Outpatient Medications on File Prior to Visit  Medication Sig Dispense Refill   carvedilol (COREG) 25 MG tablet Take 1 tablet (25 mg total) by mouth 2 (two) times daily with a meal for 30 days. 60 tablet 0   cholecalciferol (VITAMIN D3) 25 MCG (1000 UNIT) tablet Take 1,000 Units by mouth daily.     hydrALAZINE (APRESOLINE) 50 MG tablet Take 1 tablet (50 mg total) by mouth 3 (three) times daily. 270 tablet 3   isosorbide mononitrate (IMDUR) 60 MG 24 hr tablet TAKE ONE TABLET BY MOUTH ONCE DAILY. 90 tablet 1   pravastatin (PRAVACHOL) 20 MG tablet Take 20 mg by mouth daily.     amLODipine (NORVASC) 10 MG tablet Take 1 tablet (10 mg  total) by mouth daily for 30 days. (Patient not taking: Reported on 03/16/2023) 30 tablet 0   furosemide (LASIX) 20 MG tablet Take 20 mg by mouth daily as needed for fluid. (Patient not taking: Reported on 08/13/2022)     No current facility-administered medications on file prior to visit.    No Known Allergies  Social History   Substance and Sexual Activity  Alcohol Use Yes   Alcohol/week: 7.0 standard drinks of alcohol   Types: 7 Cans of beer per week   Comment: daily, 40oz beer    Social History   Tobacco Use  Smoking Status Former   Types: Cigars  Smokeless Tobacco Never    Review of Systems  Constitutional: Negative.   HENT: Negative.    Eyes: Negative.   Respiratory: Negative.    Cardiovascular: Negative.   Gastrointestinal:  Positive for abdominal pain.  Genitourinary: Negative.   Musculoskeletal: Negative.   Skin: Negative.   Neurological: Negative.   Endo/Heme/Allergies: Negative.   Psychiatric/Behavioral: Negative.      Objective   Vitals:   03/16/23 0917  BP: (!) 144/104  Pulse: 80  Resp: 16  Temp: 97.7 F (36.5 C)  SpO2: 96%    Physical Exam Vitals reviewed.  Constitutional:      Appearance: Normal  appearance. He is normal weight. He is not ill-appearing.  HENT:     Head: Normocephalic and atraumatic.  Cardiovascular:     Rate and Rhythm: Normal rate and regular rhythm.     Heart sounds: Normal heart sounds. No murmur heard.    No friction rub. No gallop.  Pulmonary:     Effort: Pulmonary effort is normal. No respiratory distress.     Breath sounds: Normal breath sounds. No stridor. No wheezing, rhonchi or rales.  Abdominal:     General: Abdomen is flat. Bowel sounds are normal. There is no distension.     Palpations: There is no mass.     Tenderness: There is no abdominal tenderness. There is no guarding or rebound.     Hernia: A hernia is present.     Comments: Large protruding left inguinal hernia.  Contents are soft and nontender.   No erythema noted.  Genitourinary:    Testes: Normal.  Skin:    General: Skin is warm and dry.  Neurological:     Mental Status: He is alert and oriented to person, place, and time.    ER notes reviewed Assessment  Incarcerated left inguinal hernia Plan  Patient is scheduled for a robotic assisted laparoscopic incarcerated left inguinal herniorrhaphy with mesh on 03/19/2023.  The risks and benefits of the procedure including bleeding, infection, recurrence, and the possibility of an open procedure were fully explained to the patient, who gave informed consent.

## 2023-03-17 ENCOUNTER — Encounter (HOSPITAL_COMMUNITY)
Admission: RE | Admit: 2023-03-17 | Discharge: 2023-03-17 | Disposition: A | Discharge status: Home / Self Care | Payer: Medicare PPO | Source: Ambulatory Visit | Attending: General Surgery | Admitting: General Surgery

## 2023-03-17 ENCOUNTER — Other Ambulatory Visit: Payer: Self-pay

## 2023-03-17 ENCOUNTER — Encounter (HOSPITAL_COMMUNITY): Payer: Self-pay

## 2023-03-17 NOTE — Patient Instructions (Addendum)
Timothy [name]  03/17/2023     Your procedure is scheduled on 03/19/23 Friday .   Report to [facility] at 6:00 A.M.   Call this number if you have problems the morning of surgery:  [phone]  If you experience any cold or flu symptoms such as cough, fever, chills, shortness of breath, etc. between now and your scheduled surgery, please notify us at the above number.   Remember:  Do not eat or drink after midnight.     Take these medicines the morning of surgery with A SIP OF WATER :  Carvedilol, Isosorbide     Do not wear jewelry, make-up or nail polish, including gel polish,  artificial nails, or any other type of covering on natural nails (fingers and  toes).  Do not wear lotions, powders, or perfumes, or deodorant.  Do not shave 48 hours prior to surgery.  Men may shave face and neck.  Do not bring valuables to the hospital.  [facility] is not responsible for any belongings or valuables.  Contacts, dentures or bridgework may not be worn into surgery.  Leave your suitcase in the car.  After surgery it may be brought to your room.  For patients admitted to the hospital, discharge time will be determined by your treatment team.  Patients discharged the day of surgery will not be allowed to drive home.   Please read over the following fact sheets that you were given. Coughing and Deep Breathing, Surgical Site Infection Prevention, and Anesthesia Post-op InstructionsGeneral Anesthesia, Adult, Care After The following information offers guidance on how to care for yourself after your procedure. Your health care provider may also give you more specific instructions. If you have problems or questions, contact your health care provider. What can I expect after the procedure? After the procedure, it is common for people to: Have pain or discomfort at the IV site. Have nausea or vomiting. Have a sore throat or hoarseness. Have trouble concentrating. Feel cold or  chills. Feel weak, sleepy, or tired (fatigue). Have soreness and body aches. These can affect parts of the body that were not involved in surgery. Follow these instructions at home: For the time period you were told by your health care provider:  Rest. Do not participate in activities where you could fall or become injured. Do not drive or use machinery. Do not drink alcohol. Do not take sleeping pills or medicines that cause drowsiness. Do not make important decisions or sign legal documents. Do not take care of children on your own. General instructions Drink enough fluid to keep your urine pale yellow. If you have sleep apnea, surgery and certain medicines can increase your risk for breathing problems. Follow instructions from your health care provider about wearing your sleep device: Anytime you are sleeping, including during daytime naps. While taking prescription pain medicines, sleeping medicines, or medicines that make you drowsy. Return to your normal activities as told by your health care provider. Ask your health care provider what activities are safe for you. Take over-the-counter and prescription medicines only as told by your health care provider. Do not use any products that contain nicotine or tobacco. These products include cigarettes, chewing tobacco, and vaping devices, such as e-cigarettes. These can delay incision healing after surgery. If you need help quitting, ask your health care provider. Contact a health care provider if: You have nausea or vomiting that does not get better with medicine. You vomit every time you eat or drink.  You have pain that does not get better with medicine. You cannot urinate or have bloody urine. You develop a skin rash. You have a fever. Get help right away if: You have trouble breathing. You have chest pain. You vomit blood. These symptoms may be an emergency. Get help right away. Call 911. Do not wait to see if the symptoms will  go away. Do not drive yourself to the hospital. Summary After the procedure, it is common to have a sore throat, hoarseness, nausea, vomiting, or to feel weak, sleepy, or fatigue. For the time period you were told by your health care provider, do not drive or use machinery. Get help right away if you have difficulty breathing, have chest pain, or vomit blood. These symptoms may be an emergency. This information is not intended to replace advice given to you by your health care provider. Make sure you discuss any questions you have with your health care provider. Document Revised: 10/17/2021 Document Reviewed: 10/17/2021 Elsevier Patient Education  2024 [employer].Laparoscopic Inguinal Hernia Repair, Adult Laparoscopic inguinal hernia repair is a surgical procedure to repair a small, weak spot in the groin muscles that allows fat or intestines from inside the abdomen to bulge out (inguinal hernia). This procedure may be planned, or it may be an emergency procedure. During the procedure, tissue that has bulged out is moved back into place, and the opening in the groin muscles is repaired. This is done through three small incisions in the abdomen. A thin tube with a light and camera on the end (laparoscope) is used to help perform the procedure. Tell a health care provider about: Any allergies you have. All medicines you are taking, including vitamins, herbs, eye drops, creams, and over-the-counter medicines. Any problems you or family members have had with anesthetic medicines. Any blood disorders you have. Any surgeries you have had. Any medical conditions you have. Whether you are pregnant or may be pregnant. What are the risks? Generally, this is a safe procedure. However, problems may occur, including: Infection. Bleeding. Allergic reactions to medicines. Damage to nearby structures or organs. Testicle damage or long-term pain and swelling of the scrotum, in males. Inability to  completely empty the bladder (urinary retention). Blood clots. A collection of fluid that builds up under the skin (seroma). The hernia coming back (recurrence). What happens before the procedure? Staying hydrated Follow instructions from your health care provider about hydration, which may include: Up to 2 hours before the procedure - you may continue to drink clear liquids, such as water, clear fruit juice, black coffee, and plain tea.  Eating and drinking restrictions Follow instructions from your health care provider about eating and drinking, which may include: 8 hours before the procedure - stop eating heavy meals or foods, such as meat, fried foods, or fatty foods. 6 hours before the procedure - stop eating light meals or foods, such as toast or cereal. 6 hours before the procedure - stop drinking milk or drinks that contain milk. 2 hours before the procedure - stop drinking clear liquids. Medicines Ask your health care provider about: Changing or stopping your regular medicines. This is especially important if you are taking diabetes medicines or blood thinners. Taking medicines such as aspirin and ibuprofen. These medicines can thin your blood. Do not take these medicines unless your health care provider tells you to take them. Taking over-the-counter medicines, vitamins, herbs, and supplements. General instructions Do not use any products that contain nicotine or tobacco for at least  4 weeks before the procedure, if possible. These products include cigarettes, chewing tobacco, and vaping devices, such as e-cigarettes. If you need help quitting, ask your health care provider. Ask your health care provider: How your surgery site will be marked. What steps will be taken to help prevent infection. These steps may include: Removing hair at the surgery site. Washing skin with a germ-killing soap. Taking antibiotic medicine. Plan to have a responsible adult take you home from the  hospital or clinic. Plan to have a responsible adult care for you for the time you are told after you leave the hospital or clinic. This is important. What happens during the procedure? An IV will be inserted into one of your veins. You will be given one or more of the following: A medicine to help you relax (sedative). A medicine to make you fall asleep (general anesthetic). Three small incisions will be made in your abdomen. Your abdomen will be inflated with carbon dioxide gas to make the surgical area easier to see. A laparoscope and surgical instruments will be inserted through the incisions. The laparoscope will send images of the inside of your abdomen to a monitor in the room. Tissue that is bulging through the hernia may be removed or moved back into place. The hernia opening will be closed with a sheet of surgical mesh. The surgical instruments and laparoscope will be removed. Your incisions will be closed with stitches (sutures) and adhesive strips. A bandage (dressing) will be placed over your incisions. The procedure may vary among health care providers and hospitals. What happens after the procedure? Your blood pressure, heart rate, breathing rate, and blood oxygen level will be monitored until you leave the hospital or clinic. You will be given pain medicine as needed. You may continue to receive medicines and fluids through an IV. The IV will be removed after you can drink fluids. You will be encouraged to get up and move around and to take deep breaths frequently. If you were given a sedative during the procedure, it can affect you for several hours. Do not drive or operate machinery until your health care provider says that it is safe. Summary Laparoscopic inguinal hernia repair is a surgical procedure to repair a small, weak spot in the groin muscles that allows fat or intestines from inside the abdomen to bulge out (inguinal hernia). This procedure is done through three  small incisions in the abdomen. A thin tube with a light and camera on the end (laparoscope) is used to help perform the procedure. After the procedure, you will be encouraged to get up and move around and to take deep breaths frequently. This information is not intended to replace advice given to you by your health care provider. Make sure you discuss any questions you have with your health care provider. Document Revised: 03/19/2020 Document Reviewed: 03/19/2020 Elsevier Patient Education  2024 Else[employer] Inc. How to Use Chlorhexidine During a Shower Chlorhexidine gluconate (CHG) is a germ-killing (antiseptic) wash that is used to clean the skin. It can get rid of the germs that normally live on the skin and can keep them away for about 24 hours. One way to clean your skin with CHG is by using CHG wash in the shower. The following information gives steps on how to use CHG during a shower. Supplies needed: CHG body wash. Clean washcloth. Clean towel. How to use CHG during a shower Follow these steps unless your health care provider gives you other instructions: Start the  shower. Get your body wet. Use the CHG to wash your hair and face. Do not get CHG in your eyes or ears. If CHG gets into your eyes or ears, rinse them well with water. Suds are okay. Rinse the CHG from your hair and face. Turn off the shower or move out of the shower stream. Pour CHG onto a clean washcloth or soft sponge. Do not use any type of brush or rough-edged sponge. Do not scrub your skin. Follow the order of these steps. Wash from top to bottom and from the cleanest to dirtiest areas. Front of neck, shoulders, and chest. Back of neck, shoulders, and back. Armpits, arms, and hands. Stomach and hips. Legs and feet. Groin, genitals, and butt last. Make sure to wash between any folds of skin, under your breasts, and between your fingers and toes. Let the lather sit on your skin for 2 minutes or as long as told by  your health care provider. Rinse your body off in the shower. Make sure that all body creases and folds are well-rinsed. Pat your skin with a clean towel. Do not put anything on your body afterward, such as powder or perfume. Use lotion only as told by your health care provider. Do these extra steps if you are washing before surgery unless your health care provider gives you other instructions: Wash the part of your body where the surgery will be done for at least 2 minutes. Do not put anything on your body afterward, such as powder, lotion, or perfume. Put on clean clothes or pajamas. If it is the night before surgery, sleep in clean sheets. General tips Use CHG only as told, and follow the instructions on the label. Use the full amount of CHG as told. This is most often one bottle. Do not smoke and stay away from flames after using CHG. Your skin may feel sticky after using CHG. This is normal. The sticky feeling will go away as the CHG dries. Do not use CHG: If you have a chlorhexidine allergy or have reacted to chlorhexidine in the past. On babies younger than 74 months of age. Contact a health care provider if: You have questions about using CHG. Your skin gets irritated or itchy. You have a rash after using CHG. You swallow any CHG. Call your local poison control center ([phone] in the [place] Your eyes itch badly, or they become very red or swollen. Your hearing changes. You have trouble seeing. Get help right away if: You have swelling or tingling in your mouth or throat. You make high-pitched whistling sounds when you breathe, most often when you breathe out (wheeze). You have trouble breathing. These symptoms may be an emergency. Get help right away. Call 911. Do not wait to see if the symptoms will go away. Do not drive yourself to the hospital. This information is not intended to replace advice given to you by your health care provider. Make sure you discuss any  questions you have with your health care provider. Document Revised: 03/05/2022 Document Reviewed: 01/29/2022 Elsevier Patient Education  2024 [employer].

## 2023-03-19 ENCOUNTER — Encounter (HOSPITAL_COMMUNITY): Payer: Self-pay | Admitting: General Surgery

## 2023-03-19 ENCOUNTER — Ambulatory Visit (HOSPITAL_COMMUNITY)
Admission: RE | Admit: 2023-03-19 | Discharge: 2023-03-19 | Disposition: A | Discharge status: Home / Self Care | Payer: Medicare PPO | Attending: General Surgery | Admitting: General Surgery

## 2023-03-19 ENCOUNTER — Encounter (HOSPITAL_COMMUNITY)
Admission: RE | Disposition: A | Discharge status: Home / Self Care | Payer: Self-pay | Source: Home / Self Care | Attending: General Surgery

## 2023-03-19 ENCOUNTER — Ambulatory Visit (HOSPITAL_BASED_OUTPATIENT_CLINIC_OR_DEPARTMENT_OTHER): Payer: Medicare PPO | Admitting: Anesthesiology

## 2023-03-19 ENCOUNTER — Ambulatory Visit (HOSPITAL_COMMUNITY): Payer: Medicare PPO | Admitting: Anesthesiology

## 2023-03-19 ENCOUNTER — Other Ambulatory Visit: Payer: Self-pay

## 2023-03-19 DIAGNOSIS — K4030 Unilateral inguinal hernia, with obstruction, without gangrene, not specified as recurrent: Secondary | ICD-10-CM

## 2023-03-19 DIAGNOSIS — Z87891 Personal history of nicotine dependence: Secondary | ICD-10-CM | POA: Insufficient documentation

## 2023-03-19 DIAGNOSIS — N1831 Chronic kidney disease, stage 3a: Secondary | ICD-10-CM | POA: Diagnosis not present

## 2023-03-19 DIAGNOSIS — I130 Hypertensive heart and chronic kidney disease with heart failure and stage 1 through stage 4 chronic kidney disease, or unspecified chronic kidney disease: Secondary | ICD-10-CM | POA: Diagnosis not present

## 2023-03-19 DIAGNOSIS — I5022 Chronic systolic (congestive) heart failure: Secondary | ICD-10-CM

## 2023-03-19 HISTORY — PX: XI ROBOTIC ASSISTED INGUINAL HERNIA REPAIR WITH MESH: SHX6706

## 2023-03-19 SURGERY — REPAIR, HERNIA, INGUINAL, ROBOT-ASSISTED, LAPAROSCOPIC, USING MESH
Anesthesia: General | Site: Abdomen | Laterality: Left

## 2023-03-19 MED ORDER — DEXAMETHASONE SODIUM PHOSPHATE 10 MG/ML IJ SOLN
INTRAMUSCULAR | Status: DC | PRN
  Administered 2023-03-19: 6 mg via INTRAVENOUS

## 2023-03-19 MED ORDER — PHENYLEPHRINE 80 MCG/ML (10ML) SYRINGE FOR IV PUSH (FOR BLOOD PRESSURE SUPPORT)
PREFILLED_SYRINGE | INTRAVENOUS | Status: AC
  Filled 2023-03-19: qty 10

## 2023-03-19 MED ORDER — BUPIVACAINE HCL (PF) 0.5 % IJ SOLN
INTRAMUSCULAR | Status: AC
  Filled 2023-03-19: qty 30

## 2023-03-19 MED ORDER — CHLORHEXIDINE GLUCONATE CLOTH 2 % EX PADS
6.0000 | MEDICATED_PAD | Freq: Once | CUTANEOUS | Status: AC
  Administered 2023-03-19: 6 via TOPICAL

## 2023-03-19 MED ORDER — LACTATED RINGERS IV BOLUS: 500.0000 mL | Freq: Once | INTRAVENOUS | Status: DC

## 2023-03-19 MED ORDER — CEFAZOLIN SODIUM-DEXTROSE 2-4 GM/100ML-% IV SOLN
2.0000 g | INTRAVENOUS | Status: AC
  Administered 2023-03-19: 2 g via INTRAVENOUS

## 2023-03-19 MED ORDER — ESMOLOL HCL 100 MG/10ML IV SOLN
INTRAVENOUS | Status: AC
  Filled 2023-03-19: qty 10

## 2023-03-19 MED ORDER — ORAL CARE MOUTH RINSE: 15.0000 mL | Freq: Once | OROMUCOSAL | Status: AC

## 2023-03-19 MED ORDER — CHLORHEXIDINE GLUCONATE 0.12 % MT SOLN
OROMUCOSAL | Status: AC
  Administered 2023-03-19: 15 mL via OROMUCOSAL
  Filled 2023-03-19: qty 15

## 2023-03-19 MED ORDER — STERILE WATER FOR IRRIGATION IR SOLN
Status: DC | PRN
  Administered 2023-03-19: 1000 mL

## 2023-03-19 MED ORDER — KETOROLAC TROMETHAMINE 30 MG/ML IJ SOLN
15.0000 mg | Freq: Once | INTRAMUSCULAR | Status: AC
  Administered 2023-03-19: 15 mg via INTRAVENOUS
  Filled 2023-03-19: qty 1

## 2023-03-19 MED ORDER — LACTATED RINGERS IV SOLN: INTRAVENOUS | Status: DC

## 2023-03-19 MED ORDER — PROPOFOL 10 MG/ML IV BOLUS
INTRAVENOUS | Status: AC
  Filled 2023-03-19: qty 20

## 2023-03-19 MED ORDER — SUGAMMADEX SODIUM 200 MG/2ML IV SOLN
INTRAVENOUS | Status: DC | PRN
  Administered 2023-03-19: 300 mg via INTRAVENOUS

## 2023-03-19 MED ORDER — PROPOFOL 10 MG/ML IV BOLUS
INTRAVENOUS | Status: DC | PRN
  Administered 2023-03-19: 180 mg via INTRAVENOUS

## 2023-03-19 MED ORDER — LIDOCAINE HCL (CARDIAC) PF 100 MG/5ML IV SOSY
PREFILLED_SYRINGE | INTRAVENOUS | Status: DC | PRN
  Administered 2023-03-19: 80 mg via INTRATRACHEAL

## 2023-03-19 MED ORDER — ROCURONIUM BROMIDE 10 MG/ML (PF) SYRINGE
PREFILLED_SYRINGE | INTRAVENOUS | Status: AC
  Filled 2023-03-19: qty 10

## 2023-03-19 MED ORDER — HYDROMORPHONE HCL 1 MG/ML IJ SOLN: 0.2500 mg | INTRAMUSCULAR | Status: DC | PRN

## 2023-03-19 MED ORDER — LIDOCAINE HCL (PF) 2 % IJ SOLN
INTRAMUSCULAR | Status: AC
  Filled 2023-03-19: qty 5

## 2023-03-19 MED ORDER — HYDROMORPHONE HCL 1 MG/ML IJ SOLN
INTRAMUSCULAR | Status: DC | PRN
  Administered 2023-03-19: .5 mg via INTRAVENOUS

## 2023-03-19 MED ORDER — MIDAZOLAM HCL 2 MG/2ML IJ SOLN
INTRAMUSCULAR | Status: DC | PRN
  Administered 2023-03-19: 2 mg via INTRAVENOUS

## 2023-03-19 MED ORDER — ONDANSETRON HCL 4 MG/2ML IJ SOLN: 4.0000 mg | Freq: Once | INTRAMUSCULAR | Status: DC | PRN

## 2023-03-19 MED ORDER — CEFAZOLIN SODIUM-DEXTROSE 2-4 GM/100ML-% IV SOLN
INTRAVENOUS | Status: AC
  Filled 2023-03-19: qty 100

## 2023-03-19 MED ORDER — DEXAMETHASONE SODIUM PHOSPHATE 10 MG/ML IJ SOLN
INTRAMUSCULAR | Status: AC
  Filled 2023-03-19: qty 1

## 2023-03-19 MED ORDER — CHLORHEXIDINE GLUCONATE 0.12 % MT SOLN: 15.0000 mL | Freq: Once | OROMUCOSAL | Status: AC

## 2023-03-19 MED ORDER — ONDANSETRON HCL 4 MG/2ML IJ SOLN
INTRAMUSCULAR | Status: DC | PRN
  Administered 2023-03-19: 4 mg via INTRAVENOUS

## 2023-03-19 MED ORDER — PHENYLEPHRINE 80 MCG/ML (10ML) SYRINGE FOR IV PUSH (FOR BLOOD PRESSURE SUPPORT)
PREFILLED_SYRINGE | INTRAVENOUS | Status: DC | PRN
  Administered 2023-03-19: 1040 ug via INTRAVENOUS
  Administered 2023-03-19: 160 ug via INTRAVENOUS

## 2023-03-19 MED ORDER — FENTANYL CITRATE (PF) 100 MCG/2ML IJ SOLN
INTRAMUSCULAR | Status: AC
  Filled 2023-03-19: qty 2

## 2023-03-19 MED ORDER — TRAMADOL HCL 50 MG PO TABS: 50.0000 mg | ORAL_TABLET | Freq: Four times a day (QID) | ORAL | 0 refills | Status: DC | PRN

## 2023-03-19 MED ORDER — FENTANYL CITRATE (PF) 100 MCG/2ML IJ SOLN
INTRAMUSCULAR | Status: DC | PRN
  Administered 2023-03-19: 50 ug via INTRAVENOUS
  Administered 2023-03-19: 100 ug via INTRAVENOUS

## 2023-03-19 MED ORDER — ONDANSETRON HCL 4 MG/2ML IJ SOLN
INTRAMUSCULAR | Status: AC
  Filled 2023-03-19: qty 2

## 2023-03-19 MED ORDER — HYDROMORPHONE HCL 1 MG/ML IJ SOLN
INTRAMUSCULAR | Status: AC
  Filled 2023-03-19: qty 1

## 2023-03-19 MED ORDER — BUPIVACAINE HCL (PF) 0.5 % IJ SOLN
INTRAMUSCULAR | Status: DC | PRN
  Administered 2023-03-19: 25 mL

## 2023-03-19 MED ORDER — MIDAZOLAM HCL 2 MG/2ML IJ SOLN
INTRAMUSCULAR | Status: AC
  Filled 2023-03-19: qty 2

## 2023-03-19 MED ORDER — ROCURONIUM BROMIDE 10 MG/ML (PF) SYRINGE
PREFILLED_SYRINGE | INTRAVENOUS | Status: DC | PRN
  Administered 2023-03-19 (×2): 50 mg via INTRAVENOUS

## 2023-03-19 MED ORDER — CHLORHEXIDINE GLUCONATE CLOTH 2 % EX PADS: 6.0000 | MEDICATED_PAD | Freq: Once | CUTANEOUS | Status: DC

## 2023-03-19 SURGICAL SUPPLY — 50 items
ADH SKN CLS APL DERMABOND .7 (GAUZE/BANDAGES/DRESSINGS) ×1
APL PRP STRL LF DISP 70% ISPRP (MISCELLANEOUS) ×1
CHLORAPREP W/TINT 26 (MISCELLANEOUS) ×2 IMPLANT
COVER LIGHT HANDLE STERIS (MISCELLANEOUS) ×2 IMPLANT
COVER MAYO STAND STRL (DRAPES) ×2 IMPLANT
COVER MAYO STAND XLG (MISCELLANEOUS) ×2 IMPLANT
COVER TIP SHEARS 8 DVNC (MISCELLANEOUS) ×2 IMPLANT
DERMABOND ADVANCED .7 DNX12 (GAUZE/BANDAGES/DRESSINGS) ×2 IMPLANT
DRAPE ARM DVNC X/XI (DISPOSABLE) ×6 IMPLANT
DRAPE COLUMN DVNC XI (DISPOSABLE) ×2 IMPLANT
DRAPE HALF SHEET 40X57 (DRAPES) ×2 IMPLANT
DRIVER NDL MEGA SUTCUT DVNCXI (INSTRUMENTS) ×2 IMPLANT
DRIVER NDLE MEGA SUTCUT DVNCXI (INSTRUMENTS) ×1 IMPLANT
ELECT REM PT RETURN 9FT ADLT (ELECTROSURGICAL) ×1
ELECTRODE REM PT RTRN 9FT ADLT (ELECTROSURGICAL) ×2 IMPLANT
FORCEPS BPLR R/ABLATION 8 DVNC (INSTRUMENTS) ×2 IMPLANT
GAUZE SPONGE 4X4 12PLY STRL (GAUZE/BANDAGES/DRESSINGS) ×2 IMPLANT
GLOVE BIOGEL PI IND STRL 7.0 (GLOVE) ×8 IMPLANT
GLOVE SURG SS PI 7.5 STRL IVOR (GLOVE) ×4 IMPLANT
GOWN STRL REUS W/TWL LRG LVL3 (GOWN DISPOSABLE) ×4 IMPLANT
IRRIGATOR SUCT 8 DISP DVNC XI (IRRIGATION / IRRIGATOR) IMPLANT
IV NS IRRIG 3000ML ARTHROMATIC (IV SOLUTION) IMPLANT
KIT PINK PAD W/HEAD ARE REST (MISCELLANEOUS) ×1
KIT PINK PAD W/HEAD ARM REST (MISCELLANEOUS) ×2 IMPLANT
KIT TURNOVER KIT A (KITS) ×2 IMPLANT
MANIFOLD NEPTUNE II (INSTRUMENTS) ×2 IMPLANT
MESH 3DMAX MID 5X7 LT XLRG (Mesh General) IMPLANT
NDL HYPO 21X1.5 SAFETY (NEEDLE) ×2 IMPLANT
NDL INSUFFLATION 14GA 120MM (NEEDLE) ×2 IMPLANT
NEEDLE HYPO 21X1.5 SAFETY (NEEDLE) ×1 IMPLANT
NEEDLE INSUFFLATION 14GA 120MM (NEEDLE) ×1 IMPLANT
OBTURATOR OPTICAL STND 8 DVNC (TROCAR) ×1
OBTURATOR OPTICALSTD 8 DVNC (TROCAR) ×2 IMPLANT
PACK LAP CHOLE LZT030E (CUSTOM PROCEDURE TRAY) ×2 IMPLANT
PENCIL HANDSWITCHING (ELECTRODE) ×2 IMPLANT
POSITIONER HEAD 8X9X4 ADT (SOFTGOODS) ×2 IMPLANT
SCISSORS MNPLR CVD DVNC XI (INSTRUMENTS) ×2 IMPLANT
SEAL UNIV 5-12 XI (MISCELLANEOUS) ×6 IMPLANT
SET BASIN LINEN APH (SET/KITS/TRAYS/PACK) ×2 IMPLANT
SET TUBE SMOKE EVAC HIGH FLOW (TUBING) ×2 IMPLANT
SOL PREP POV-IOD 4OZ 10% (MISCELLANEOUS) ×2 IMPLANT
SUT MNCRL AB 4-0 PS2 18 (SUTURE) ×4 IMPLANT
SUT V-LOC 90 ABS 3-0 VLT V-20 (SUTURE) ×4 IMPLANT
SUT VIC AB 2-0 SH 27 (SUTURE) ×1
SUT VIC AB 2-0 SH 27X BRD (SUTURE) ×2 IMPLANT
SYR 30ML LL (SYRINGE) ×2 IMPLANT
TAPE TRANSPORE STRL 2 31045 (GAUZE/BANDAGES/DRESSINGS) ×2 IMPLANT
TRAY FOL W/BAG SLVR 16FR STRL (SET/KITS/TRAYS/PACK) ×2 IMPLANT
TRAY FOLEY W/BAG SLVR 16FR LF (SET/KITS/TRAYS/PACK) ×1
WATER STERILE IRR 500ML POUR (IV SOLUTION) ×2 IMPLANT

## 2023-03-19 NOTE — Transfer of Care (Addendum)
Immediate Anesthesia Transfer of Care Note  Patient: Timothy [name]  Procedure(s) Performed: XI ROBOTIC ASSISTED INGUINAL HERNIA REPAIR WITH MESH (Left: Abdomen)  Patient Location: PACU  Anesthesia Type:General  Level of Consciousness: drowsy and patient cooperative  Airway & Oxygen Therapy: Patient Spontanous Breathing and Patient connected to face mask oxygen  Post-op Assessment: Report given to RN and Post -op Vital signs reviewed and stable  Post vital signs: Reviewed and stable  Last Vitals:  Vitals Value Taken Time  BP 179/101 03/19/23 0946  Temp 36.5 C 03/19/23 0930  Pulse 66 03/19/23 0948  Resp 22 03/19/23 0948  SpO2 100 % 03/19/23 0948  Vitals shown include unfiled device data.  Last Pain:  Vitals:   03/19/23 0930  TempSrc:   PainSc: Asleep         Complications: No notable events documented.

## 2023-03-19 NOTE — Op Note (Signed)
Patient:  Timothy [name]  DOB:  July 18, 1956  MRN:  [id number]   Preop Diagnosis: Incarcerated left inguinal hernia  Postop Diagnosis: Same  Procedure: Robotic assisted laparoscopic left inguinal herniorrhaphy with mesh  Surgeon: Franky Macho, MD  Anes: General Endotracheal  Indications: Patient is a 67 year old black male who has had recurrent episodes of incarceration of his left inguinal hernia.  The risks and benefits of the procedure including bleeding, infection, mesh use, and the possibility of recurrence of the hernia were fully explained to the patient, who gave informed consent.  Procedure note: The patient was placed in the supine position.  After induction of general endotracheal anesthesia, the abdomen was prepped and draped using the usual sterile technique with ChloraPrep.  Surgical site confirmation was performed.  An incision was made in the left upper quadrant at Palmer's point.  A Veress needle was introduced into the abdominal cavity and confirmation of placement was done using the saline drop test.  The abdomen was then insufflated to 15 mmHg pressure.  An 8 mm trocar was introduced into the abdominal cavity under direct visualization without difficulty.  Additional 8 mm trocars were placed in the upper midline region and right upper quadrant regions.  The robot was then targeted and docked.  The patient had had a previous right inguinal hernia repair which looked intact.  The patient had a left inguinal hernia which was reduced intraoperatively.  The patient was placed in deeper Trendelenburg position.  The peritoneal flap was then formed lateral to medial.  This was taken down to below Cooper's ligament and the ileal inguinal line.  The indirect hernia sac was then reduced from the spermatic cord.  Once a full reduction was performed, a 6 cm posterior length between the hernia defect in the posterior portion of the flap was formed.  An extra-large Bard 3D Max was inserted  and secured to Cooper's ligament using a 2-0 Vicryl suture.  An additional 2-0 Vicryl suture was placed anteriorly to secure the mesh.  The peritoneal flap was then closed using a 3-0 V-Loc running suture.  The abdomen was then deflated and good approximation of the peritoneum was noted to the mesh.  All sutures were then removed from the operative field.  The robot was undocked and all air was evacuated from the abdominal cavity prior to removal of the trocars.  All wounds were irrigated with normal saline.  All wounds were injected with 0.5% Sensorcaine.  All incisions were closed using a 4-0 Monocryl subcuticular suture.  Dermabond was applied.  All tape and needle counts were correct at the end of the procedure.  The patient was extubated in the operating room and transferred to PACU in stable condition.  Complications: None  EBL: Minimal  Specimen: None

## 2023-03-19 NOTE — Anesthesia Preprocedure Evaluation (Signed)
Anesthesia Evaluation  Patient identified by MRN, date of birth, ID band Patient awake    Reviewed: Allergy & Precautions, H&P , NPO status , Patient's Chart, lab work & pertinent test results, reviewed documented beta blocker date and time   Airway Mallampati: II  TM Distance: >3 FB Neck ROM: Full    Dental  (+) Dental Advisory Given, Poor Dentition, Missing, Loose,    Pulmonary neg pulmonary ROS, former smoker   Pulmonary exam normal breath sounds clear to auscultation       Cardiovascular Exercise Tolerance: Good hypertension, Pt. on medications and Pt. on home beta blockers +CHF  + dysrhythmias Atrial Fibrillation  Rhythm:Irregular Rate:Normal  1. Left ventricular ejection fraction, by estimation, is 45 to 50%. The  left ventricle has mildly decreased function. The left ventricle  demonstrates global hypokinesis. There is severe asymmetric left  ventricular hypertrophy of the basal-septal  segment. Left ventricular diastolic parameters are indeterminate.   2. Right ventricular systolic function is normal. The right ventricular  size is normal.   3. Left atrial size was severely dilated.   4. Right atrial size was moderately dilated.   5. The pericardial effusion is circumferential.   6. The mitral valve is grossly normal. Trivial mitral valve  regurgitation. MVA by 2d Planimetry 3.86 cm2.   7. The aortic valve is tricuspid. There is mild calcification of the  aortic valve. Aortic valve regurgitation is trivial. No aortic stenosis is  present.   8. The inferior vena cava is normal in size with greater than 50%  respiratory variability, suggesting right atrial pressure of 3 mmHg.     Neuro/Psych negative neurological ROS  negative psych ROS   GI/Hepatic ,GERD  ,,(+)     substance abuse  alcohol use  Endo/Other  negative endocrine ROS    Renal/GU Renal InsufficiencyRenal disease  negative genitourinary    Musculoskeletal negative musculoskeletal ROS (+)    Abdominal   Peds negative pediatric ROS (+)  Hematology negative hematology ROS (+)   Anesthesia Other Findings   Reproductive/Obstetrics negative OB ROS                             Anesthesia Physical Anesthesia Plan  ASA: 3  Anesthesia Plan: General   Post-op Pain Management: Dilaudid IV   Induction: Intravenous  PONV Risk Score and Plan: 1 and Ondansetron and Dexamethasone  Airway Management Planned: Oral ETT  Additional Equipment:   Intra-op Plan:   Post-operative Plan: Extubation in OR  Informed Consent: I have reviewed the patients History and Physical, chart, labs and discussed the procedure including the risks, benefits and alternatives for the proposed anesthesia with the patient or authorized representative who has indicated his/her understanding and acceptance.     Dental advisory given  Plan Discussed with: CRNA and Surgeon  Anesthesia Plan Comments:         Anesthesia Quick Evaluation

## 2023-03-19 NOTE — Interval H&P Note (Signed)
History and Physical Interval Note:  03/19/2023 7:13 AM  Timothy [name]  has presented today for surgery, with the diagnosis of INGUINAL HERNIA, LEFT, INCARCERATED.  The various methods of treatment have been discussed with the patient and family. After consideration of risks, benefits and other options for treatment, the patient has consented to  Procedure(s): XI ROBOTIC ASSISTED INGUINAL HERNIA REPAIR WITH MESH (Left) as a surgical intervention.  The patient's history has been reviewed, patient examined, no change in status, stable for surgery.  I have reviewed the patient's chart and labs.  Questions were answered to the patient's satisfaction.     Franky Macho

## 2023-03-19 NOTE — Anesthesia Postprocedure Evaluation (Signed)
Anesthesia Post Note  Patient: Timothy [name]  Procedure(s) Performed: XI ROBOTIC ASSISTED INGUINAL HERNIA REPAIR WITH MESH (Left: Abdomen)  Patient location during evaluation: Phase II Anesthesia Type: General Level of consciousness: awake and alert and oriented Pain management: pain level controlled Vital Signs Assessment: post-procedure vital signs reviewed and stable Respiratory status: spontaneous breathing, nonlabored ventilation and respiratory function stable Cardiovascular status: blood pressure returned to baseline and stable Postop Assessment: no apparent nausea or vomiting Anesthetic complications: no  No notable events documented.   Last Vitals:  Vitals:   03/19/23 1000 03/19/23 1015  BP: (!) 173/102 (!) 158/103  Pulse: (!) 57 (!) 57  Resp: 13 14  Temp:  36.7 C  SpO2: 100% 99%    Last Pain:  Vitals:   03/19/23 1015  TempSrc: Oral  PainSc: 0-No pain                 Tagg Eustice C Enzio Buchler

## 2023-03-19 NOTE — Anesthesia Procedure Notes (Signed)
Procedure Name: Intubation Date/Time: 03/19/2023 7:39 AM  Performed by: Oletha Cruel, CRNAPre-anesthesia Checklist: Patient identified, Emergency Drugs available, Suction available, Patient being monitored and Timeout performed Patient Re-evaluated:Patient Re-evaluated prior to induction Preoxygenation: Pre-oxygenation with 100% oxygen Induction Type: IV induction Ventilation: Mask ventilation without difficulty Laryngoscope Size: Mac and 4 Grade View: Grade II Tube type: Oral Tube size: 7.0 mm Number of attempts: 1 Airway Equipment and Method: Stylet Placement Confirmation: ETT inserted through vocal cords under direct vision, positive ETCO2, CO2 detector and breath sounds checked- equal and bilateral Secured at: 21 cm Tube secured with: Tape Dental Injury: Teeth and Oropharynx as per pre-operative assessment  Comments: Loose left front tooth intact and in preanesthetic state following intubation.

## 2023-03-22 ENCOUNTER — Encounter (HOSPITAL_COMMUNITY): Payer: Self-pay | Admitting: General Surgery

## 2023-03-26 ENCOUNTER — Ambulatory Visit: Payer: Medicare PPO | Admitting: General Surgery

## 2023-03-26 ENCOUNTER — Encounter: Payer: Self-pay | Admitting: General Surgery

## 2023-03-26 DIAGNOSIS — Z09 Encounter for follow-up examination after completed treatment for conditions other than malignant neoplasm: Secondary | ICD-10-CM

## 2023-03-26 NOTE — Progress Notes (Signed)
Subjective:     Timothy [name]  Attempted telephone postoperative visit with patient.  He did not answer his phone.  I called his wife's phone and his son answered.  He stated that his father was doing well.  He was still sleeping.  I told him to have them give me a call should any problems arise.  He understood. Objective:    There were no vitals taken for this visit.  General:  Sleeping       Assessment:    Doing well postoperatively.    Plan:   Follow-up as needed.  Total telephone time was 30 seconds.  As this was a part of the total global surgical fee, this was not a billable visit.

## 2023-04-21 ENCOUNTER — Other Ambulatory Visit: Payer: Self-pay

## 2023-04-21 ENCOUNTER — Encounter (HOSPITAL_COMMUNITY): Payer: Self-pay | Admitting: Emergency Medicine

## 2023-04-21 ENCOUNTER — Emergency Department (HOSPITAL_COMMUNITY): Payer: Medicare PPO

## 2023-04-21 ENCOUNTER — Encounter (HOSPITAL_COMMUNITY)
Admission: EM | Disposition: A | Discharge status: Rehabilitation Facility | Payer: Self-pay | Source: Home / Self Care | Attending: Neurology

## 2023-04-21 ENCOUNTER — Inpatient Hospital Stay (HOSPITAL_COMMUNITY)
Admission: EM | Admit: 2023-04-21 | Discharge: 2023-04-28 | DRG: 024 | Disposition: A | Discharge status: Rehabilitation Facility | Payer: Medicare PPO | Attending: Neurology | Admitting: Neurology

## 2023-04-21 DIAGNOSIS — N1831 Chronic kidney disease, stage 3a: Secondary | ICD-10-CM | POA: Diagnosis present

## 2023-04-21 DIAGNOSIS — I959 Hypotension, unspecified: Secondary | ICD-10-CM | POA: Diagnosis not present

## 2023-04-21 DIAGNOSIS — H518 Other specified disorders of binocular movement: Secondary | ICD-10-CM | POA: Diagnosis present

## 2023-04-21 DIAGNOSIS — R4701 Aphasia: Secondary | ICD-10-CM

## 2023-04-21 DIAGNOSIS — R4781 Slurred speech: Secondary | ICD-10-CM | POA: Diagnosis present

## 2023-04-21 DIAGNOSIS — E785 Hyperlipidemia, unspecified: Secondary | ICD-10-CM | POA: Diagnosis present

## 2023-04-21 DIAGNOSIS — I6389 Other cerebral infarction: Secondary | ICD-10-CM | POA: Diagnosis not present

## 2023-04-21 DIAGNOSIS — G8191 Hemiplegia, unspecified affecting right dominant side: Secondary | ICD-10-CM | POA: Diagnosis present

## 2023-04-21 DIAGNOSIS — I63512 Cerebral infarction due to unspecified occlusion or stenosis of left middle cerebral artery: Secondary | ICD-10-CM

## 2023-04-21 DIAGNOSIS — Z87891 Personal history of nicotine dependence: Secondary | ICD-10-CM

## 2023-04-21 DIAGNOSIS — I5032 Chronic diastolic (congestive) heart failure: Secondary | ICD-10-CM | POA: Diagnosis present

## 2023-04-21 DIAGNOSIS — I63412 Cerebral infarction due to embolism of left middle cerebral artery: Principal | ICD-10-CM | POA: Diagnosis present

## 2023-04-21 DIAGNOSIS — K5901 Slow transit constipation: Secondary | ICD-10-CM | POA: Diagnosis not present

## 2023-04-21 DIAGNOSIS — R159 Full incontinence of feces: Secondary | ICD-10-CM | POA: Diagnosis not present

## 2023-04-21 DIAGNOSIS — Z833 Family history of diabetes mellitus: Secondary | ICD-10-CM

## 2023-04-21 DIAGNOSIS — I509 Heart failure, unspecified: Secondary | ICD-10-CM

## 2023-04-21 DIAGNOSIS — R1312 Dysphagia, oropharyngeal phase: Secondary | ICD-10-CM | POA: Diagnosis present

## 2023-04-21 DIAGNOSIS — J9811 Atelectasis: Secondary | ICD-10-CM | POA: Diagnosis not present

## 2023-04-21 DIAGNOSIS — Z6823 Body mass index (BMI) 23.0-23.9, adult: Secondary | ICD-10-CM | POA: Diagnosis not present

## 2023-04-21 DIAGNOSIS — I10 Essential (primary) hypertension: Secondary | ICD-10-CM | POA: Diagnosis not present

## 2023-04-21 DIAGNOSIS — I639 Cerebral infarction, unspecified: Principal | ICD-10-CM

## 2023-04-21 DIAGNOSIS — I130 Hypertensive heart and chronic kidney disease with heart failure and stage 1 through stage 4 chronic kidney disease, or unspecified chronic kidney disease: Secondary | ICD-10-CM

## 2023-04-21 DIAGNOSIS — I480 Paroxysmal atrial fibrillation: Secondary | ICD-10-CM | POA: Diagnosis present

## 2023-04-21 DIAGNOSIS — I69320 Aphasia following cerebral infarction: Secondary | ICD-10-CM | POA: Diagnosis not present

## 2023-04-21 DIAGNOSIS — Z1152 Encounter for screening for COVID-19: Secondary | ICD-10-CM

## 2023-04-21 DIAGNOSIS — R001 Bradycardia, unspecified: Secondary | ICD-10-CM | POA: Diagnosis not present

## 2023-04-21 DIAGNOSIS — E119 Type 2 diabetes mellitus without complications: Secondary | ICD-10-CM | POA: Diagnosis not present

## 2023-04-21 DIAGNOSIS — F09 Unspecified mental disorder due to known physiological condition: Secondary | ICD-10-CM | POA: Diagnosis not present

## 2023-04-21 DIAGNOSIS — F1410 Cocaine abuse, uncomplicated: Secondary | ICD-10-CM | POA: Diagnosis present

## 2023-04-21 DIAGNOSIS — Z7901 Long term (current) use of anticoagulants: Secondary | ICD-10-CM | POA: Diagnosis not present

## 2023-04-21 DIAGNOSIS — R29709 NIHSS score 9: Secondary | ICD-10-CM | POA: Diagnosis present

## 2023-04-21 DIAGNOSIS — I6602 Occlusion and stenosis of left middle cerebral artery: Secondary | ICD-10-CM | POA: Diagnosis present

## 2023-04-21 DIAGNOSIS — Z79899 Other long term (current) drug therapy: Secondary | ICD-10-CM | POA: Diagnosis not present

## 2023-04-21 DIAGNOSIS — Z8679 Personal history of other diseases of the circulatory system: Secondary | ICD-10-CM | POA: Diagnosis not present

## 2023-04-21 DIAGNOSIS — Z7982 Long term (current) use of aspirin: Secondary | ICD-10-CM

## 2023-04-21 DIAGNOSIS — Z8042 Family history of malignant neoplasm of prostate: Secondary | ICD-10-CM

## 2023-04-21 DIAGNOSIS — R29810 Facial weakness: Secondary | ICD-10-CM | POA: Diagnosis present

## 2023-04-21 DIAGNOSIS — R1310 Dysphagia, unspecified: Secondary | ICD-10-CM | POA: Diagnosis not present

## 2023-04-21 DIAGNOSIS — K219 Gastro-esophageal reflux disease without esophagitis: Secondary | ICD-10-CM | POA: Diagnosis present

## 2023-04-21 DIAGNOSIS — N183 Chronic kidney disease, stage 3 unspecified: Secondary | ICD-10-CM | POA: Diagnosis not present

## 2023-04-21 DIAGNOSIS — Z8249 Family history of ischemic heart disease and other diseases of the circulatory system: Secondary | ICD-10-CM

## 2023-04-21 DIAGNOSIS — F1490 Cocaine use, unspecified, uncomplicated: Secondary | ICD-10-CM | POA: Diagnosis not present

## 2023-04-21 DIAGNOSIS — F802 Mixed receptive-expressive language disorder: Secondary | ICD-10-CM | POA: Diagnosis present

## 2023-04-21 DIAGNOSIS — N189 Chronic kidney disease, unspecified: Secondary | ICD-10-CM | POA: Diagnosis not present

## 2023-04-21 DIAGNOSIS — N179 Acute kidney failure, unspecified: Secondary | ICD-10-CM | POA: Diagnosis not present

## 2023-04-21 DIAGNOSIS — E440 Moderate protein-calorie malnutrition: Secondary | ICD-10-CM | POA: Diagnosis present

## 2023-04-21 DIAGNOSIS — I429 Cardiomyopathy, unspecified: Secondary | ICD-10-CM | POA: Diagnosis present

## 2023-04-21 DIAGNOSIS — I5022 Chronic systolic (congestive) heart failure: Secondary | ICD-10-CM | POA: Diagnosis not present

## 2023-04-21 DIAGNOSIS — I69391 Dysphagia following cerebral infarction: Secondary | ICD-10-CM | POA: Diagnosis not present

## 2023-04-21 DIAGNOSIS — I4891 Unspecified atrial fibrillation: Secondary | ICD-10-CM | POA: Diagnosis not present

## 2023-04-21 DIAGNOSIS — I5030 Unspecified diastolic (congestive) heart failure: Secondary | ICD-10-CM | POA: Diagnosis not present

## 2023-04-21 HISTORY — PX: IR CT HEAD LTD: IMG2386

## 2023-04-21 HISTORY — DX: Unspecified atrial fibrillation: I48.91

## 2023-04-21 HISTORY — PX: IR PERCUTANEOUS ART THROMBECTOMY/INFUSION INTRACRANIAL INC DIAG ANGIO: IMG6087

## 2023-04-21 HISTORY — PX: RADIOLOGY WITH ANESTHESIA: SHX6223

## 2023-04-21 LAB — CBC
HCT: 48.2 % (ref 39.0–52.0)
Hemoglobin: 15.6 g/dL (ref 13.0–17.0)
MCH: 27.1 pg (ref 26.0–34.0)
MCHC: 32.4 g/dL (ref 30.0–36.0)
MCV: 83.8 fL (ref 80.0–100.0)
Platelets: 151 10*3/uL (ref 150–400)
RBC: 5.75 MIL/uL (ref 4.22–5.81)
RDW: 13.8 % (ref 11.5–15.5)
WBC: 5 10*3/uL (ref 4.0–10.5)
nRBC: 0 % (ref 0.0–0.2)

## 2023-04-21 LAB — RAPID URINE DRUG SCREEN, HOSP PERFORMED
Amphetamines: NOT DETECTED
Barbiturates: NOT DETECTED
Benzodiazepines: NOT DETECTED
Cocaine: POSITIVE — AB
Opiates: NOT DETECTED
Tetrahydrocannabinol: NOT DETECTED

## 2023-04-21 LAB — COMPREHENSIVE METABOLIC PANEL WITH GFR
ALT: 12 U/L (ref 0–44)
AST: 17 U/L (ref 15–41)
Albumin: 4 g/dL (ref 3.5–5.0)
Alkaline Phosphatase: 69 U/L (ref 38–126)
Anion gap: 7 (ref 5–15)
BUN: 19 mg/dL (ref 8–23)
CO2: 24 mmol/L (ref 22–32)
Calcium: 9.4 mg/dL (ref 8.9–10.3)
Chloride: 105 mmol/L (ref 98–111)
Creatinine, Ser: 1.41 mg/dL — ABNORMAL HIGH (ref 0.61–1.24)
GFR, Estimated: 54 mL/min — ABNORMAL LOW (ref >60)
Glucose, Bld: 163 mg/dL — ABNORMAL HIGH (ref 70–99)
Potassium: 4.3 mmol/L (ref 3.5–5.1)
Sodium: 136 mmol/L (ref 135–145)
Total Bilirubin: 1.3 mg/dL — ABNORMAL HIGH (ref 0.3–1.2)
Total Protein: 7.4 g/dL (ref 6.5–8.1)

## 2023-04-21 LAB — CBG MONITORING, ED
Glucose-Capillary: 149 mg/dL — ABNORMAL HIGH (ref 70–99)
Glucose-Capillary: 99 mg/dL (ref 70–99)

## 2023-04-21 LAB — DIFFERENTIAL
Abs Immature Granulocytes: 0.01 10*3/uL (ref 0.00–0.07)
Basophils Absolute: 0.1 10*3/uL (ref 0.0–0.1)
Basophils Relative: 2 %
Eosinophils Absolute: 0.2 10*3/uL (ref 0.0–0.5)
Eosinophils Relative: 5 %
Immature Granulocytes: 0 %
Lymphocytes Relative: 26 %
Lymphs Abs: 1.3 10*3/uL (ref 0.7–4.0)
Monocytes Absolute: 0.5 10*3/uL (ref 0.1–1.0)
Monocytes Relative: 10 %
Neutro Abs: 2.9 10*3/uL (ref 1.7–7.7)
Neutrophils Relative %: 57 %

## 2023-04-21 LAB — I-STAT CHEM 8, ED
BUN: 20 mg/dL (ref 8–23)
Calcium, Ion: 1.17 mmol/L (ref 1.15–1.40)
Chloride: 106 mmol/L (ref 98–111)
Creatinine, Ser: 1.5 mg/dL — ABNORMAL HIGH (ref 0.61–1.24)
Glucose, Bld: 160 mg/dL — ABNORMAL HIGH (ref 70–99)
HCT: 48 % (ref 39.0–52.0)
Hemoglobin: 16.3 g/dL (ref 13.0–17.0)
Potassium: 4.5 mmol/L (ref 3.5–5.1)
Sodium: 139 mmol/L (ref 135–145)
TCO2: 23 mmol/L (ref 22–32)

## 2023-04-21 LAB — RESP PANEL BY RT-PCR (RSV, FLU A&B, COVID)  RVPGX2
Influenza A by PCR: NEGATIVE
Influenza B by PCR: NEGATIVE
Resp Syncytial Virus by PCR: NEGATIVE
SARS Coronavirus 2 by RT PCR: NEGATIVE

## 2023-04-21 LAB — APTT: aPTT: 38 s — ABNORMAL HIGH (ref 24–36)

## 2023-04-21 LAB — PROTIME-INR
INR: 1.1 (ref 0.8–1.2)
Prothrombin Time: 14.2 s (ref 11.4–15.2)

## 2023-04-21 LAB — ETHANOL: Alcohol, Ethyl (B): <10 mg/dL (ref <10)

## 2023-04-21 LAB — MRSA NEXT GEN BY PCR, NASAL: MRSA by PCR Next Gen: NOT DETECTED

## 2023-04-21 SURGERY — IR WITH ANESTHESIA
Anesthesia: General

## 2023-04-21 MED ORDER — SODIUM CHLORIDE 0.9 % IV SOLN: INTRAVENOUS | Status: DC

## 2023-04-21 MED ORDER — ORAL CARE MOUTH RINSE: 15.0000 mL | OROMUCOSAL | Status: DC | PRN

## 2023-04-21 MED ORDER — ASPIRIN 300 MG RE SUPP
300.0000 mg | Freq: Every day | RECTAL | Status: DC
  Administered 2023-04-21 – 2023-04-23 (×3): 300 mg via RECTAL
  Filled 2023-04-21 (×3): qty 1

## 2023-04-21 MED ORDER — SUGAMMADEX SODIUM 200 MG/2ML IV SOLN
INTRAVENOUS | Status: DC | PRN
  Administered 2023-04-21: 400 mg via INTRAVENOUS

## 2023-04-21 MED ORDER — ACETAMINOPHEN 650 MG RE SUPP
650.0000 mg | RECTAL | Status: DC | PRN
  Administered 2023-04-22 – 2023-04-23 (×2): 650 mg via RECTAL
  Filled 2023-04-21 (×3): qty 1

## 2023-04-21 MED ORDER — SODIUM CHLORIDE 0.9 % IV SOLN
INTRAVENOUS | Status: DC | PRN
Start: 2023-04-21 — End: 2023-04-21

## 2023-04-21 MED ORDER — ORAL CARE MOUTH RINSE
15.0000 mL | OROMUCOSAL | Status: DC
  Administered 2023-04-21 – 2023-04-28 (×24): 15 mL via OROMUCOSAL

## 2023-04-21 MED ORDER — STROKE: EARLY STAGES OF RECOVERY BOOK
Freq: Once | Status: AC
  Filled 2023-04-21: qty 1

## 2023-04-21 MED ORDER — LIDOCAINE 2% (20 MG/ML) 5 ML SYRINGE
INTRAMUSCULAR | Status: DC | PRN
  Administered 2023-04-21: 80 mg via INTRAVENOUS

## 2023-04-21 MED ORDER — CHLORHEXIDINE GLUCONATE CLOTH 2 % EX PADS
6.0000 | MEDICATED_PAD | Freq: Every day | CUTANEOUS | Status: DC
  Administered 2023-04-21 – 2023-04-24 (×4): 6 via TOPICAL

## 2023-04-21 MED ORDER — IOHEXOL 300 MG/ML  SOLN
100.0000 mL | Freq: Once | INTRAMUSCULAR | Status: AC | PRN
  Administered 2023-04-21: 10 mL via INTRA_ARTERIAL

## 2023-04-21 MED ORDER — ACETAMINOPHEN 325 MG PO TABS
650.0000 mg | ORAL_TABLET | ORAL | Status: DC | PRN
  Administered 2023-04-26: 650 mg via ORAL
  Filled 2023-04-21: qty 2

## 2023-04-21 MED ORDER — CLEVIDIPINE BUTYRATE 0.5 MG/ML IV EMUL
0.0000 mg/h | INTRAVENOUS | Status: DC
  Administered 2023-04-21: 16 mg/h via INTRAVENOUS

## 2023-04-21 MED ORDER — CEFAZOLIN SODIUM-DEXTROSE 2-3 GM-%(50ML) IV SOLR
INTRAVENOUS | Status: DC | PRN
Start: 2023-04-21 — End: 2023-04-21
  Administered 2023-04-21: 2 g via INTRAVENOUS

## 2023-04-21 MED ORDER — LABETALOL HCL 5 MG/ML IV SOLN: 5.0000 mg | Freq: Once | INTRAVENOUS | Status: DC

## 2023-04-21 MED ORDER — LACTATED RINGERS IV SOLN
INTRAVENOUS | Status: DC | PRN
Start: 2023-04-21 — End: 2023-04-21

## 2023-04-21 MED ORDER — CLEVIDIPINE BUTYRATE 0.5 MG/ML IV EMUL
INTRAVENOUS | Status: DC | PRN
  Administered 2023-04-21: 1 mg/h via INTRAVENOUS

## 2023-04-21 MED ORDER — IOHEXOL 350 MG/ML SOLN
75.0000 mL | Freq: Once | INTRAVENOUS | Status: AC | PRN
  Administered 2023-04-21: 75 mL via INTRAVENOUS

## 2023-04-21 MED ORDER — LABETALOL HCL 5 MG/ML IV SOLN
INTRAVENOUS | Status: AC
  Filled 2023-04-21: qty 4

## 2023-04-21 MED ORDER — ROCURONIUM BROMIDE 10 MG/ML (PF) SYRINGE
PREFILLED_SYRINGE | INTRAVENOUS | Status: DC | PRN
  Administered 2023-04-21: 20 mg via INTRAVENOUS
  Administered 2023-04-21: 60 mg via INTRAVENOUS

## 2023-04-21 MED ORDER — ACETAMINOPHEN 325 MG PO TABS: 650.0000 mg | ORAL_TABLET | ORAL | Status: DC | PRN

## 2023-04-21 MED ORDER — ACETAMINOPHEN 160 MG/5ML PO SOLN: 650.0000 mg | ORAL | Status: DC | PRN

## 2023-04-21 MED ORDER — ONDANSETRON HCL 4 MG/2ML IJ SOLN
INTRAMUSCULAR | Status: DC | PRN
  Administered 2023-04-21: 4 mg via INTRAVENOUS

## 2023-04-21 MED ORDER — SUCCINYLCHOLINE CHLORIDE 200 MG/10ML IV SOSY
PREFILLED_SYRINGE | INTRAVENOUS | Status: DC | PRN
  Administered 2023-04-21: 120 mg via INTRAVENOUS

## 2023-04-21 MED ORDER — PROPOFOL 10 MG/ML IV BOLUS
INTRAVENOUS | Status: DC | PRN
  Administered 2023-04-21: 50 mg via INTRAVENOUS
  Administered 2023-04-21: 100 mg via INTRAVENOUS
  Administered 2023-04-21: 50 mg via INTRAVENOUS

## 2023-04-21 MED ORDER — DEXAMETHASONE SODIUM PHOSPHATE 10 MG/ML IJ SOLN
INTRAMUSCULAR | Status: DC | PRN
  Administered 2023-04-21: 5 mg via INTRAVENOUS

## 2023-04-21 MED ORDER — CLEVIDIPINE BUTYRATE 0.5 MG/ML IV EMUL
0.0000 mg/h | INTRAVENOUS | Status: AC
  Administered 2023-04-21: 8 mg/h via INTRAVENOUS
  Administered 2023-04-22: 2 mg/h via INTRAVENOUS
  Administered 2023-04-22: 6 mg/h via INTRAVENOUS
  Administered 2023-04-22: 10 mg/h via INTRAVENOUS
  Administered 2023-04-22: 8 mg/h via INTRAVENOUS
  Administered 2023-04-22: 6 mg/h via INTRAVENOUS
  Administered 2023-04-22: 8 mg/h via INTRAVENOUS
  Filled 2023-04-21 (×7): qty 50

## 2023-04-21 MED ORDER — PHENYLEPHRINE HCL-NACL 20-0.9 MG/250ML-% IV SOLN
INTRAVENOUS | Status: DC | PRN
  Administered 2023-04-21: 40 ug/min via INTRAVENOUS

## 2023-04-21 MED ORDER — EPHEDRINE SULFATE-NACL 50-0.9 MG/10ML-% IV SOSY
PREFILLED_SYRINGE | INTRAVENOUS | Status: DC | PRN
  Administered 2023-04-21: 2.5 mg via INTRAVENOUS
  Administered 2023-04-21: 5 mg via INTRAVENOUS
  Administered 2023-04-21: 2.5 mg via INTRAVENOUS
  Administered 2023-04-21: 5 mg via INTRAVENOUS

## 2023-04-21 MED ORDER — ASPIRIN 325 MG PO TABS
325.0000 mg | ORAL_TABLET | Freq: Every day | ORAL | Status: DC
  Filled 2023-04-21: qty 1

## 2023-04-21 MED ORDER — SODIUM CHLORIDE (PF) 0.9 % IJ SOLN
INTRAVENOUS | Status: AC | PRN
  Administered 2023-04-21: 25 ug via INTRA_ARTERIAL

## 2023-04-21 MED ORDER — IOHEXOL 300 MG/ML  SOLN
150.0000 mL | Freq: Once | INTRAMUSCULAR | Status: AC | PRN
  Administered 2023-04-21: 75 mL via INTRA_ARTERIAL

## 2023-04-21 MED ORDER — IOHEXOL 350 MG/ML SOLN
40.0000 mL | Freq: Once | INTRAVENOUS | Status: AC | PRN
  Administered 2023-04-21: 40 mL via INTRAVENOUS

## 2023-04-21 MED ORDER — ACETAMINOPHEN 650 MG RE SUPP: 650.0000 mg | RECTAL | Status: DC | PRN

## 2023-04-21 MED ORDER — GLYCOPYRROLATE 0.2 MG/ML IJ SOLN
INTRAMUSCULAR | Status: DC | PRN
Start: 2023-04-21 — End: 2023-04-21
  Administered 2023-04-21: .1 mg via INTRAVENOUS

## 2023-04-21 NOTE — ED Notes (Signed)
1354 telestroke activated and neurologist at bedside.

## 2023-04-21 NOTE — ED Notes (Signed)
Per tele neuro, Dr Wilford Corner, do  not treat bp unless sys over 220

## 2023-04-21 NOTE — Anesthesia Procedure Notes (Signed)
Procedure Name: Intubation Date/Time: 04/21/2023 3:36 PM  Performed by: Kayleen Memos, CRNAPre-anesthesia Checklist: Patient identified, Emergency Drugs available, Suction available and Patient being monitored Patient Re-evaluated:Patient Re-evaluated prior to induction Oxygen Delivery Method: Circle System Utilized Preoxygenation: Pre-oxygenation with 100% oxygen Induction Type: IV induction and Rapid sequence Ventilation: Mask ventilation without difficulty Laryngoscope Size: Mac and 4 Grade View: Grade I Tube type: Oral Number of attempts: 1 Airway Equipment and Method: Stylet Placement Confirmation: ETT inserted through vocal cords under direct vision, positive ETCO2 and breath sounds checked- equal and bilateral Secured at: 23 cm Tube secured with: Tape Dental Injury: Teeth and Oropharynx as per pre-operative assessment

## 2023-04-21 NOTE — ED Notes (Signed)
CODE STROKE called and beeped out @ 1352.

## 2023-04-21 NOTE — ED Provider Notes (Incomplete)
[place] EMERGENCY DEPARTMENT AT [facility] Provider Note   CSN: [id number] Arrival date & time: 04/21/23  1108     History {Add pertinent medical, surgical, social history, OB history to HPI:1} Chief Complaint  Patient presents with   Aphasia    Timothy [name] is a 67 y.o. male.  Patient has been having slurred speech since 11 PM yesterday.   Weakness      Home Medications Prior to Admission medications   Medication Sig Start Date End Date Taking? Authorizing Provider  amLODipine (NORVASC) 10 MG tablet Take 1 tablet (10 mg total) by mouth daily for 30 days. 12/24/18 03/17/23  Arrien, York Ram, MD  aspirin EC 81 MG tablet Take 81 mg by mouth daily. Swallow whole.    [provider]  carvedilol (COREG) 25 MG tablet Take 1 tablet (25 mg total) by mouth 2 (two) times daily with a meal for 30 days. 12/23/18 03/17/23  Arrien, York Ram, MD  cholecalciferol (VITAMIN D3) 25 MCG (1000 UNIT) tablet Take 1,000 Units by mouth daily.    [provider]  furosemide (LASIX) 20 MG tablet Take 20 mg by mouth daily as needed for fluid. 02/06/22   [provider]  hydrALAZINE (APRESOLINE) 50 MG tablet Take 1 tablet (50 mg total) by mouth 3 (three) times daily. 07/04/21 03/17/23  Sande Rives, MD  isosorbide mononitrate (IMDUR) 60 MG 24 hr tablet TAKE ONE TABLET BY MOUTH ONCE DAILY. 04/02/22   Jake Bathe, MD  pravastatin (PRAVACHOL) 20 MG tablet Take 20 mg by mouth daily. 06/25/22   [provider]  traMADol (ULTRAM) 50 MG tablet Take 1 tablet (50 mg total) by mouth every 6 (six) hours as needed. 03/19/23   Franky Macho, MD      Allergies    Patient has no known allergies.    Review of Systems   Review of Systems  Neurological:  Positive for weakness.    Physical Exam Updated Vital Signs BP (!) 207/109   Pulse (!) 59   Temp 98 F (36.7 C) (Oral)   Resp (!) 22   Ht 5\' 7"  (1.702 m)   Wt 68.5 kg   SpO2 96%   BMI  23.65 kg/m  Physical Exam  ED Results / Procedures / Treatments   Labs (all labs ordered are listed, but only abnormal results are displayed) Labs Reviewed  APTT - Abnormal; Notable for the following components:      Result Value   aPTT 38 (*)    All other components within normal limits  COMPREHENSIVE METABOLIC PANEL - Abnormal; Notable for the following components:   Glucose, Bld 163 (*)    Creatinine, Ser 1.41 (*)    Total Bilirubin 1.3 (*)    GFR, Estimated 54 (*)    All other components within normal limits  RAPID URINE DRUG SCREEN, HOSP PERFORMED - Abnormal; Notable for the following components:   Cocaine POSITIVE (*)    All other components within normal limits  I-STAT CHEM 8, ED - Abnormal; Notable for the following components:   Creatinine, Ser 1.50 (*)    Glucose, Bld 160 (*)    All other components within normal limits  CBG MONITORING, ED - Abnormal; Notable for the following components:   Glucose-Capillary 149 (*)    All other components within normal limits  PROTIME-INR  CBC  DIFFERENTIAL  ETHANOL    EKG None  Radiology CT HEAD WO CONTRAST  Result Date: 04/21/2023 CLINICAL DATA:  Neuro deficit, acute, stroke suspected EXAM: CT HEAD WITHOUT CONTRAST TECHNIQUE: Contiguous axial images were obtained from the base of the skull through the vertex without intravenous contrast. RADIATION DOSE REDUCTION: This exam was performed according to the departmental dose-optimization program which includes automated exposure control, adjustment of the mA and/or kV according to patient size and/or use of iterative reconstruction technique. COMPARISON:  12/21/2018 FINDINGS: Brain: Focal area of decreased gray-white matter differentiation within the posterior left frontal lobe (series 3, image 22). No additional sites of acute infarction are identified. No intracranial hemorrhage or extra-axial collection. No mass lesion or mass effect. No hydrocephalus. Patchy low-density changes  within the periventricular and subcortical white matter most compatible with chronic microvascular ischemic change. Mild diffuse cerebral volume loss. Vascular: Atherosclerotic calcifications involving the large vessels of the skull base. No unexpected hyperdense vessel. Skull: Normal. Negative for fracture or focal lesion. Sinuses/Orbits: No acute finding. 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 IMPRESSION: 1. Focal area of decreased gray-white matter differentiation within the posterior left frontal lobe, concerning for acute or subacute infarction. MRI is recommended for further evaluation. 2. No intracranial hemorrhage. 3. Chronic microvascular ischemic change and cerebral volume loss. These results were called by telephone at the time of interpretation on 04/21/2023 at 12:07 pm to provider [facility] Virgia Kelner , who verbally acknowledged these results. Electronically Signed   By: Duanne Guess D.O.   On: 04/21/2023 12:09    Procedures Procedures  {Document cardiac monitor, telemetry assessment procedure when appropriate:1}  Medications Ordered in ED Medications  iohexol (OMNIPAQUE) 350 MG/ML injection 75 mL (75 mLs Intravenous Contrast Given 04/21/23 1331)  iohexol (OMNIPAQUE) 350 MG/ML injection 40 mL (40 mLs Intravenous Contrast Given 04/21/23 1413)    ED Course/ Medical Decision Making/ A&P   {  CRITICAL CARE Performed by: Bethann Berkshire Total critical care time: 45 minutes Critical care time was exclusive of separately billable procedures and treating other patients. Critical care was necessary to treat or prevent imminent or life-threatening deterioration. Critical care was time spent personally by me on the following activities: development of treatment plan with patient and/or surrogate as well as nursing, discussions with consultants, evaluation of patient's response to treatment, examination of patient, obtaining  history from patient or surrogate, ordering and performing treatments and interventions, ordering and review of laboratory studies, ordering and review of radiographic studies, pulse oximetry and re-evaluation of patient's condition.  Click here for ABCD2, HEART and other calculatorsREFRESH Note before signing :1}                              Medical Decision Making Amount and/or Complexity of Data Reviewed Labs: ordered. Radiology: ordered.  Risk Prescription drug management.   Patient with an acute stroke.  He was seen by neurology and the patient is being transferred to interventional radiology to remove a clot by Dr. Gavin Pound for.  {Document critical care time when appropriate:1} {Document review of labs and clinical decision tools ie heart score, Chads2Vasc2 etc:1}  {Document your independent review of radiology images, and any outside records:1} {Document your discussion with family members, caretakers, and with consultants:1} {Document social determinants of health affecting pt's care:1} {Document your decision making why or why not admission, treatments were needed:1} Final Clinical Impression(s) / ED Diagnoses Final diagnoses:  Stroke, acute, thrombotic (HCC)    Rx / DC Orders ED Discharge Orders  None

## 2023-04-21 NOTE — ED Notes (Signed)
Per edp no code stroke and do not need teleneuro at this time but do rest of orders. Pt taken to ct at this time

## 2023-04-21 NOTE — ED Notes (Signed)
Report given to Tammy at [facility] IR

## 2023-04-21 NOTE — Anesthesia Procedure Notes (Signed)
Arterial Line Insertion Start/End9/18/2024 3:30 PM, 04/21/2023 3:35 PM Performed by: Collene Schlichter, MD, anesthesiologist  Patient location: OOR procedure area. Preanesthetic checklist: patient identified, IV checked, site marked, risks and benefits discussed, surgical consent, monitors and equipment checked, pre-op evaluation, timeout performed and anesthesia consent Left, radial was placed Catheter size: 20 G Hand hygiene performed  and maximum sterile barriers used   Attempts: 1 Procedure performed without using ultrasound guided technique. Following insertion, dressing applied and Biopatch. Post procedure assessment: normal and unchanged  Patient tolerated the procedure well with no immediate complications.

## 2023-04-21 NOTE — Discharge Instructions (Addendum)
Transfer patient to [facility], IR suite.  Dr.arora accepting.  Also Dr. Chong Sicilian wall will see the patient
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
## 2023-04-21 NOTE — Transfer of Care (Signed)
Immediate Anesthesia Transfer of Care Note  Patient: Timothy [name]  Procedure(s) Performed: IR WITH ANESTHESIA  Patient Location: PACU  Anesthesia Type:General  Level of Consciousness: awake, pateint uncooperative, and responds to stimulation  Airway & Oxygen Therapy: Patient Spontanous Breathing and Patient connected to face mask oxygen  Post-op Assessment: Report given to RN, Post -op Vital signs reviewed and stable, and Patient moving all extremities  Post vital signs: Reviewed and stable  Last Vitals:  Vitals Value Taken Time  BP 160/81 04/21/23 1745  Temp    Pulse 57 04/21/23 1753  Resp 18 04/21/23 1753  SpO2 100 % 04/21/23 1753  Vitals shown include unfiled device data.  Last Pain:  Vitals:   04/21/23 1459  TempSrc:   PainSc: 0-No pain         Complications: No notable events documented.
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
## 2023-04-21 NOTE — Progress Notes (Signed)
Telestroke Note   1456: [facility] at patients bedside.   1459: Patient leaving room with [facility] at this time.   Derrill Kay Telestroke RN

## 2023-04-21 NOTE — Progress Notes (Signed)
Telestroke Note   1354: Code stroke cart activated at this time by nursing for patient who presented to the ED after experiencing a fall. Patient was noted to have slurred speech and right facial droop per Dr. Estell Harpin. Dr. Wilford Corner assessed patient earlier today and CT imaging obtained. CT imaging prompted code stroke activation. Dr. Wilford Corner on camera at time of code stroke activation. LKW 9/17 at 2300. At this time, patient experiencing aphasia, right facial droop, and slurred speech.   1357: Dr. Wilford Corner performing neuro assessment.  1410: Patient left for CT.   1419: Patient returned from CT.   1432: Dr. Wilford Corner and the Neuro Internationalist team speaking to patients wife at this time regarding CT imaging results. Patient to be transferred to [facility].    Derrill Kay Telestroke RN

## 2023-04-21 NOTE — ED Triage Notes (Signed)
Pt wife called ems today due to pt having some slurred speech last night starting at 11pm after a fall. Pt states did not hit head. Cbg in [address]. Pt states he agrees he is not "talking right". Supposed to be on blood thinners for a fib but is not. Pt arrived a/o. Moving all extremities. Pupils perrla. Pt lips slightly asymetrical.

## 2023-04-21 NOTE — Anesthesia Preprocedure Evaluation (Addendum)
Anesthesia Evaluation  Patient identified by MRN, date of birth, ID band Patient confused    Reviewed: Allergy & Precautions, NPO status , Patient's Chart, lab work & pertinent test results, Unable to perform ROS - Chart review onlyPreop documentation limited or incomplete due to emergent nature of procedure.  Airway Mallampati: II  TM Distance: >3 FB Neck ROM: Full    Dental  (+) Teeth Intact, Loose   Pulmonary former smoker   Pulmonary exam normal breath sounds clear to auscultation       Cardiovascular hypertension, Pt. on medications and Pt. on home beta blockers +CHF  Normal cardiovascular exam+ dysrhythmias Atrial Fibrillation  Rhythm:Regular Rate:Normal     Neuro/Psych CVA (aphasia, slurred speech)    GI/Hepatic ,GERD  ,,(+)     substance abuse  alcohol use and cocaine use  Endo/Other  negative endocrine ROS    Renal/GU Renal InsufficiencyRenal disease     Musculoskeletal negative musculoskeletal ROS (+)    Abdominal   Peds  Hematology negative hematology ROS (+)   Anesthesia Other Findings Day of surgery medications reviewed with the patient.  Reproductive/Obstetrics                             Anesthesia Physical Anesthesia Plan  ASA: 4 and emergent  Anesthesia Plan: General   Post-op Pain Management:    Induction: Intravenous  PONV Risk Score and Plan: 2 and Treatment may vary due to age or medical condition  Airway Management Planned: Oral ETT  Additional Equipment: Arterial line  Intra-op Plan:   Post-operative Plan: Possible Post-op intubation/ventilation  Informed Consent: I have reviewed the patients History and Physical, chart, labs and discussed the procedure including the risks, benefits and alternatives for the proposed anesthesia with the patient or authorized representative who has indicated his/her understanding and acceptance.     Only emergency  history available and History available from chart only  Plan Discussed with: CRNA  Anesthesia Plan Comments:         Anesthesia Quick Evaluation

## 2023-04-21 NOTE — ED Notes (Addendum)
In with teleneuro. Pt has had change in neuro. Pt not as alert.getting confused on following commands. And more slurred speech noted. Tele neuro aware of change.

## 2023-04-21 NOTE — Plan of Care (Addendum)
On-call neurology note  Called by Dr. Estell Harpin regarding this patient with last known well at 11 PM yesterday, woke up with slurred speech and right facial droop.  CT head with a left frontal evolving infarct.  No evidence of aphasia.  No weakness. VAN negative at this time. Recommend MRI, CTA head and neck.  If the CTA head and neck shows any concerning findings, may need to be transferred over to [facility] but other than that, can be admitted to [facility] for a full stroke workup that should include frequent neurochecks, telemetry, 2D echo, A1c, lipid panel.  Admit to hospitalist. Routine neurology consultation on the floor tomorrow.   -- Milon Dikes, MD Neurologist [facility] Pager: [phone]

## 2023-04-21 NOTE — Consult Note (Addendum)
[facility] Telemedicine Consult   Requesting Provider: Dr. Estell Harpin Consult Participants: Dr. Marthe Patch, Telespecialist RN Rivka Barbara bedside RN Neysa Bonito Location of the provider: [facility] Location of the patient: [facility]-bed 6  This consult was provided via telemedicine with 2-way video and audio communication. The patient/family was informed that care would be provided in this way and agreed to receive care in this manner.   Chief Complaint: Facial droop with slurred speech which then worsened to aphasia  HPI: 67 year old with a history of atrial fibrillation alcohol abuse CHF CKD hypertension hyperlipidemia noncompliance to medications presented to the emergency department after his wife found him to be not talking right and having some right-sided facial droop. I was called by the ED provider around 12:15 PM, who noted that the patient has right facial droop but is able to follow commands and is able to have a conversation although with dysarthric speech-specifically asked for aphasia and was told there was no aphasia.  RN verified there was no aphasia on exam on the NIH scale as well.  At that, he was already outside the window for IV thrombolysis.  CT head was done which showed loss of gray-white differentiation in the posterior left frontal lobe. I recommended CT angio and admission for further stroke workup as the CT head showed some hypoattenuation in the left MCA territory.  His CT angiography showed a left M2 occlusion.  These results were called to me by the radiologist after seeing my plan of care note in the chart regarding my recommendations for vessel imaging. I called back the hospital, spoke with the attending and the RN who reported that his symptoms just started to worsen now with some symptoms of aphasia. He still is VAN negative given no arm or leg weakness, but given facial droop, and dysarhtria persistent and now aphasia and imaging  showing M2 occlusion on left,  I recommended a code stroke be activated and camera evaluation as a telestroke be performed since there is concern for LVO and he is within window for EVT.. I evaluated him emergently.  His exam did reveal expressive more than receptive aphasia-see my examination below. Sent him for CT perfusion study emergently to assess eligibility for EVT.  Review of the CT perfusion study shows a 55 cc penumbra with no core although there might be a little bit of overestimation because there is also penumbra picked up from the right cerebellar hemisphere.   Past Medical History:  Diagnosis Date   Alcohol abuse    Atrial fibrillation (HCC)    CHF (congestive heart failure) (HCC)    CKD (chronic kidney disease) stage 3, GFR 30-59 ml/min (HCC)    Hyperlipidemia    Hypertension    Noncompliance     No current facility-administered medications for this encounter.  Current Outpatient Medications:    amLODipine (NORVASC) 10 MG tablet, Take 1 tablet (10 mg total) by mouth daily for 30 days., Disp: 30 tablet, Rfl: 0   aspirin EC 81 MG tablet, Take 81 mg by mouth daily. Swallow whole., Disp: , Rfl:    carvedilol (COREG) 25 MG tablet, Take 1 tablet (25 mg total) by mouth 2 (two) times daily with a meal for 30 days., Disp: 60 tablet, Rfl: 0   cholecalciferol (VITAMIN D3) 25 MCG (1000 UNIT) tablet, Take 1,000 Units by mouth daily., Disp: , Rfl:    furosemide (LASIX) 20 MG tablet, Take 20 mg by mouth daily as needed for fluid., Disp: , Rfl:  hydrALAZINE (APRESOLINE) 50 MG tablet, Take 1 tablet (50 mg total) by mouth 3 (three) times daily., Disp: 270 tablet, Rfl: 3   isosorbide mononitrate (IMDUR) 60 MG 24 hr tablet, TAKE ONE TABLET BY MOUTH ONCE DAILY., Disp: 90 tablet, Rfl: 1   pravastatin (PRAVACHOL) 20 MG tablet, Take 20 mg by mouth daily., Disp: , Rfl:    traMADol (ULTRAM) 50 MG tablet, Take 1 tablet (50 mg total) by mouth every 6 (six) hours as needed., Disp: 25 tablet, Rfl:  0    LKW: Little before 11 PM on 04/20/2023 IV thrombolysis given?: No, outside the window IR Thrombectomy? No, yes Modified Rankin Scale: 0-Completely asymptomatic and back to baseline post- stroke Time of teleneurologist evaluation: 1354 hrs.  Exam: Vitals:   04/21/23 1230 04/21/23 1245  BP: (!) 175/81 (!) 188/79  Pulse: (!) 53 (!) 45  Resp: 13 16  Temp:    SpO2: 99% 96%    General: Awake alert in no distress Neurological exam He is awake alert oriented to self.  He is able to say his name.  Unable to tell me age and month correctly.  Able to follow simple commands inconsistently-was able to close his eyes to command but could not make a fist.  Could not stick his tongue out to command. Was able to repeat sentences.  Naming for simple objects such as pen and eyeglasses was impaired. Cranial nerves: Pupils equal round react light, extra movements intact, visual fields appeared unhindered although was difficult to examine over the camera, right lower facial droop at rest and on smiling. Motor examination did not reveal any drift in any of the 4 extremities Sensation appeared to be intact Coordination difficult to assess    NIHSS 1A: Level of Consciousness - 0 1B: Ask Month and Age - 2 1C: 'Blink Eyes' & 'Squeeze Hands' - 1 2: Test Horizontal Extraocular Movements - 0 3: Test Visual Fields - 0 4: Test Facial Palsy - 2 5A: Test Left Arm Motor Drift - 0 5B: Test Right Arm Motor Drift - 0 6A: Test Left Leg Motor Drift - 0 6B: Test Right Leg Motor Drift - 0 7: Test Limb Ataxia - 0 8: Test Sensation - 0 9: Test Language/Aphasia- 1 10: Test Dysarthria - 1 11: Test Extinction/Inattention - 0 NIHSS score: 7   Imaging Reviewed:  CT head shows focal area of subtle loss of gray-white differentiation within the posterior left frontal lobe concerning for acute or subacute infarct. CT angiography head and neck shows left M2 superior division just after bifurcation.  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  Calcified plaque in intracranial ICAs resulting in mild to moderate stenosis bilaterally.  Mild plaque at the carotid bifurcations left worse than right without hemodynamically significant stenosis or occlusion.  Patent vertebral arteries.  Labs reviewed in epic and pertinent values follow: CBC    Component Value Date/Time   WBC 5.0 04/21/2023 1140   RBC 5.75 04/21/2023 1140   HGB 16.3 04/21/2023 1142   HCT 48.0 04/21/2023 1142   PLT 151 04/21/2023 1140   MCV 83.8 04/21/2023 1140   MCH 27.1 04/21/2023 1140   MCHC 32.4 04/21/2023 1140   RDW 13.8 04/21/2023 1140   LYMPHSABS 1.3 04/21/2023 1140   MONOABS 0.5 04/21/2023 1140   EOSABS 0.2 04/21/2023 1140   BASOSABS 0.1 04/21/2023 1140   CMP     Component Value Date/Time   NA 139 04/21/2023 1142   K 4.5 04/21/2023 1142  CL 106 04/21/2023 1142   CO2 24 04/21/2023 1140   GLUCOSE 160 (H) 04/21/2023 1142   BUN 20 04/21/2023 1142   CREATININE 1.50 (H) 04/21/2023 1142   CREATININE 1.65 (H) 10/22/2016 0851   CALCIUM 9.4 04/21/2023 1140   CALCIUM 9.2 02/16/2017 0918   PROT 7.4 04/21/2023 1140   ALBUMIN 4.0 04/21/2023 1140   AST 17 04/21/2023 1140   ALT 12 04/21/2023 1140   ALKPHOS 69 04/21/2023 1140   BILITOT 1.3 (H) 04/21/2023 1140   GFRNONAA 54 (L) 04/21/2023 1140   GFRNONAA 40 (L) 03/03/2016 0806   GFRAA 41 (L) 12/23/2018 0549   GFRAA 46 (L) 03/03/2016 1610     Assessment: 67 year old history of atrial fibrillation amongst others with sudden onset of speech difficulty and facial droop started around 11 PM and he was normal just past that time last night. Went to bed, came to the hospital next morning.  Initially evaluated with findings of facial droop and dysarthria only and no aphasia according to ED. Provided for recommendations to continue stroke workup and obtain CT angiography of the head and neck as well. CT angiography showed left M2 occlusion.  Around the same  time, according to RN the NIH changed and he started exhibiting some aphasia. NIH stroke scale documented above-detailed exam documented above. Risks benefits alternatives of EVT discussed with the patient's wife at bedside on a three-way call with Dr. Corliss Skains, [facility] and myself. After detailed conversation, she agreed to proceed with the intervention. Patient will be transferred to [facility] for an emergent procedure. This will be done under emergent consent as none of the providers were in person with the wife at [facility]. Corliss Skains is at [facility] and I am at [facility] evaluating him via telestroke.  The wife did verbally acknowledge consent-the EDP Dr. Estell Harpin was in the room on that call as well.  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 Amada Jupiter is the accepting physician and will see the patient upon arrival. He has been notified The IR team has notified via "IR paged. Plan discussed in detail with Dr. Estell Harpin and patient's wife over the camera on the phone multiple times during this encounter. Imaging was personally reviewed by me  -- Milon Dikes, MD Neurologist [facility] Pager: [phone]   CRITICAL CARE ATTESTATION Performed by: Milon Dikes, MD Total critical care time: 60 minutes Critical care time was exclusive of separately billable procedures and treating other patients and/or supervising APPs/Residents/Students Critical care was necessary to treat or prevent imminent or life-threatening deterioration. This patient is critically ill and at significant risk for neurological worsening and/or death and care requires constant monitoring. Critical care was time spent personally by me on the following activities: development of treatment plan with patient and/or surrogate as well as nursing, discussions  with consultants, evaluation of patient's response to treatment, examination of patient, obtaining history from patient or surrogate, ordering and performing treatments and interventions, ordering and review of laboratory studies, ordering and review of radiographic studies, pulse oximetry, re-evaluation of patient's condition, participation in multidisciplinary rounds and medical decision making of high complexity in the care of this patient.

## 2023-04-21 NOTE — Procedures (Signed)
INR.  Status post left common carotid arteriogram.  Right  CFA approach.  Findings.  Occlusion of  a frontal branch of the superior division of the left MCA in the distal M2 M3 junction, and occlusion of the inferior branch of the superior division in the M3 M4 junction.  Revascularization of the frontal  branch of the superior division of the right MCA with 1 pass with penumbra 043 contact aspiration, and with 1 pass with an 038 Socrates aspiration catheter of the inferior branch of the superior division in the M3 M4 segment achieving aTICI 2C revascularization.  Post CT brain no evidence of intracranial hemorrhage.  8 French Angio-Seal closure device deployed for hemostasis at the right groin puncture site.  Distal pulses all present bilaterally unchanged.  Patient extubated.  Opens eyes to name.  Pupils 2 mm bilaterally sluggish.  No gross facial asymmetry.  Wiggles the toes of the right foot.  Not much movement in the right upper and lower extremities otherwise.  Fatima Sanger MD

## 2023-04-22 ENCOUNTER — Encounter (HOSPITAL_COMMUNITY): Payer: Self-pay | Admitting: Radiology

## 2023-04-22 ENCOUNTER — Inpatient Hospital Stay (HOSPITAL_COMMUNITY): Payer: Medicare PPO

## 2023-04-22 DIAGNOSIS — I6389 Other cerebral infarction: Secondary | ICD-10-CM

## 2023-04-22 DIAGNOSIS — I639 Cerebral infarction, unspecified: Secondary | ICD-10-CM | POA: Diagnosis not present

## 2023-04-22 LAB — BASIC METABOLIC PANEL
Anion gap: 13 (ref 5–15)
BUN: 14 mg/dL (ref 8–23)
CO2: 20 mmol/L — ABNORMAL LOW (ref 22–32)
Calcium: 8.6 mg/dL — ABNORMAL LOW (ref 8.9–10.3)
Chloride: 101 mmol/L (ref 98–111)
Creatinine, Ser: 1.28 mg/dL — ABNORMAL HIGH (ref 0.61–1.24)
GFR, Estimated: >60 mL/min (ref >60)
Glucose, Bld: 113 mg/dL — ABNORMAL HIGH (ref 70–99)
Potassium: 4 mmol/L (ref 3.5–5.1)
Sodium: 134 mmol/L — ABNORMAL LOW (ref 135–145)

## 2023-04-22 LAB — CBC WITH DIFFERENTIAL/PLATELET
Abs Immature Granulocytes: 0.03 10*3/uL (ref 0.00–0.07)
Basophils Absolute: 0.1 10*3/uL (ref 0.0–0.1)
Basophils Relative: 1 %
Eosinophils Absolute: 0 10*3/uL (ref 0.0–0.5)
Eosinophils Relative: 0 %
HCT: 43.5 % (ref 39.0–52.0)
Hemoglobin: 14.5 g/dL (ref 13.0–17.0)
Immature Granulocytes: 0 %
Lymphocytes Relative: 12 %
Lymphs Abs: 1 10*3/uL (ref 0.7–4.0)
MCH: 27.1 pg (ref 26.0–34.0)
MCHC: 33.3 g/dL (ref 30.0–36.0)
MCV: 81.3 fL (ref 80.0–100.0)
Monocytes Absolute: 0.4 10*3/uL (ref 0.1–1.0)
Monocytes Relative: 6 %
Neutro Abs: 6.4 10*3/uL (ref 1.7–7.7)
Neutrophils Relative %: 81 %
Platelets: 152 10*3/uL (ref 150–400)
RBC: 5.35 MIL/uL (ref 4.22–5.81)
RDW: 13.6 % (ref 11.5–15.5)
WBC: 7.9 10*3/uL (ref 4.0–10.5)
nRBC: 0 % (ref 0.0–0.2)

## 2023-04-22 LAB — ECHOCARDIOGRAM COMPLETE
Area-P 1/2: 1.99 cm2
Calc EF: 67.1 %
Height: 67 in
S' Lateral: 3.4 cm
Single Plane A2C EF: 70.2 %
Single Plane A4C EF: 62.4 %
Weight: 2416 oz

## 2023-04-22 LAB — LIPID PANEL
Cholesterol: 165 mg/dL (ref 0–200)
HDL: 53 mg/dL (ref >40)
LDL Cholesterol: 91 mg/dL (ref 0–99)
Total CHOL/HDL Ratio: 3.1 RATIO
Triglycerides: 107 mg/dL (ref <150)
VLDL: 21 mg/dL (ref 0–40)

## 2023-04-22 LAB — HEMOGLOBIN A1C
Hgb A1c MFr Bld: 6.3 % — ABNORMAL HIGH (ref 4.8–5.6)
Mean Plasma Glucose: 134.11 mg/dL

## 2023-04-22 MED ORDER — ENOXAPARIN SODIUM 40 MG/0.4ML IJ SOSY
40.0000 mg | PREFILLED_SYRINGE | INTRAMUSCULAR | Status: DC
  Administered 2023-04-22 – 2023-04-25 (×4): 40 mg via SUBCUTANEOUS
  Filled 2023-04-22 (×4): qty 0.4

## 2023-04-22 MED ORDER — CLEVIDIPINE BUTYRATE 0.5 MG/ML IV EMUL
0.0000 mg/h | INTRAVENOUS | Status: DC
  Administered 2023-04-23: 10 mg/h via INTRAVENOUS
  Administered 2023-04-23 (×2): 6 mg/h via INTRAVENOUS
  Filled 2023-04-22 (×3): qty 50

## 2023-04-22 NOTE — Progress Notes (Signed)
Arterial line not working at this time and will not read or pull back. Lab called to request morning labs to be drawn.

## 2023-04-22 NOTE — Evaluation (Signed)
Physical Therapy Evaluation Patient Details Name: Timothy [name] MRN: [id number] DOB: 09-Jan-1956 Today's Date: 04/22/2023  History of Present Illness  Patient is 67 y.o. 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 Pt s/p revascularization of the of the superior branch of the right MCA with mechanical thrombectomy achieving TICI 2C revascularization. PMH: ETOH abuse, CHF, CKD, HTN.   Clinical Impression  Timothy [name] is 67 y.o. male admitted with above HPI and diagnosis. Patient is currently limited by functional impairments below (see PT problem list). Patient lives with spouse and was independent at baseline. Currently pt requires Mod +2 assist for functional bed mobility and transfers, unable to progress to gait this date. Pt demonstrates significant Rt inattention/neglect but with max cues was able to rotate head to Rt, unable to maintain gaze. Family present and educated on engaging pt slightly Rt of midline and encouraged to continue talking to pt but to allow extra time for pt to process and attempt responding due to receptive/expressive deficits in speech. Patient will benefit from continued skilled PT interventions to address impairments and progress independence with mobility. Patient will benefit from intensive inpatient follow up therapy, >3 hours/day. Acute PT will follow and progress as able.         If plan is discharge home, recommend the following: A lot of help with walking and/or transfers;A lot of help with bathing/dressing/bathroom;Assistance with cooking/housework;Direct supervision/assist for medications management;Assist for transportation;Help with stairs or ramp for entrance;Supervision due to cognitive status   Can travel by private vehicle        Equipment Recommendations Other (comment) (TBD at next venue)  Recommendations for Other Services  Rehab consult    Functional Status Assessment Patient has had a  recent decline in their functional status and demonstrates the ability to make significant improvements in function in a reasonable and predictable amount of time.     Precautions / Restrictions Precautions Precautions: Fall Precaution Comments: R neglect; A-line; SBP 100-180 Restrictions Weight Bearing Restrictions: No      Mobility  Bed Mobility Overal bed mobility: Needs Assistance Bed Mobility: Supine to Sit     Supine to sit: Mod assist, +2 for safety/equipment, Used rails, HOB elevated     General bed mobility comments: Pt initiated movement of LE's to EOB and reaching Lt Ue towards edge. Pt leaving RUE behind him and assist to position required. Assist with bed pad to scoot hips to EOB. Pt initially R leaning; able to correct midline postural control with min A.    Transfers Overall transfer level: Needs assistance Equipment used: 2 person hand held assist Transfers: Sit to/from Stand, Bed to chair/wheelchair/BSC Sit to Stand: Min assist, +2 physical assistance   Step pivot transfers: Mod assist, +2 safety/equipment       General transfer comment: Min +2 to rise, pt slightly impulsive and eager to initiate. Mod +2 to weight shift Rt/Lt initally fading to Min +2 onfinal steps bed>chair.    Ambulation/Gait                  Stairs            Wheelchair Mobility     Tilt Bed    Modified Rankin (Stroke Patients Only) Modified Rankin (Stroke Patients Only) Pre-Morbid Rankin Score: No significant disability Modified Rankin: Severe disability     Balance Overall balance assessment: Needs assistance Sitting-balance support: Bilateral upper extremity supported, Feet supported Sitting balance-Leahy Scale:  Fair     Standing balance support: Bilateral upper extremity supported, During functional activity Standing balance-Leahy Scale: Poor                               Pertinent Vitals/Pain Pain Assessment Faces Pain Scale: No hurt     Home Living Family/patient expects to be discharged to:: Private residence Living Arrangements: Spouse/significant other;Children Available Help at Discharge: Family;Available 24 hours/day Type of Home: House Home Access: Stairs to enter Entrance Stairs-Rails: None Entrance Stairs-Number of Steps: 1 Alternate Level Stairs-Number of Steps: 13 Home Layout: Two level;Full bath on main level;Bed/bath upstairs Home Equipment: None (Wife uses a rollator; RW)      Prior Function Prior Level of Function : Independent/Modified Independent;Driving;Other (comment) (retired)                     Extremity/Trunk Assessment   Upper Extremity Assessment Upper Extremity Assessment: Defer to OT evaluation RUE Deficits / Details: difficulty moving on command however able to activate RUE proximally > distalyy during familiar functional movement patterns; trying to use R hand to wash face however unable to grasp coth - noted shoulder activation RUE Sensation: decreased light touch;decreased proprioception RUE Coordination: decreased fine motor;decreased gross motor    Lower Extremity Assessment Lower Extremity Assessment: Generalized weakness;RLE deficits/detail;LLE deficits/detail    Cervical / Trunk Assessment Cervical / Trunk Assessment: Other exceptions Cervical / Trunk Exceptions: Rt neglect and restign head turned Lt  Communication   Communication Communication: Difficulty following commands/understanding;Difficulty communicating thoughts/reduced clarity of speech Cueing Techniques: Verbal cues;Gestural cues;Tactile cues;Visual cues  Cognition Arousal: Alert Behavior During Therapy: Flat affect, Impulsive Overall Cognitive Status: Impaired/Different from baseline Area of Impairment: Attention, Safety/judgement, Awareness, Problem solving, Following commands                   Current Attention Level: Sustained   Following Commands: Follows one step commands  inconsistently Safety/Judgement: Decreased awareness of deficits, Decreased awareness of safety Awareness: Intellectual Problem Solving: Slow processing, Difficulty sequencing, Requires verbal cues, Requires tactile cues          General Comments      Exercises     Assessment/Plan    PT Assessment Patient needs continued PT services  PT Problem List Decreased strength;Decreased activity tolerance;Decreased balance;Decreased mobility;Decreased coordination;Decreased knowledge of use of DME;Decreased cognition;Decreased safety awareness;Decreased knowledge of precautions;Impaired sensation       PT Treatment Interventions DME instruction;Gait training;Stair training;Functional mobility training;Therapeutic activities;Therapeutic exercise;Balance training;Neuromuscular re-education;Cognitive remediation;Patient/family education;Wheelchair mobility training;Manual techniques    PT Goals (Current goals can be found in the Care Plan section)  Acute Rehab PT Goals Patient Stated Goal: recover and get home PT Goal Formulation: With family Time For Goal Achievement: 05/06/23 Potential to Achieve Goals: Good    Frequency Min 1X/week     Co-evaluation PT/OT/SLP Co-Evaluation/Treatment: Yes Reason for Co-Treatment: Necessary to address cognition/behavior during functional activity;For patient/therapist safety;To address functional/ADL transfers PT goals addressed during session: Mobility/safety with mobility;Balance;Proper use of DME OT goals addressed during session: ADL's and self-care       AM-PAC PT "6 Clicks" Mobility  Outcome Measure Help needed turning from your back to your side while in a flat bed without using bedrails?: A Lot Help needed moving from lying on your back to sitting on the side of a flat bed without using bedrails?: A Lot Help needed moving to and from a bed to a chair (including a  wheelchair)?: Total Help needed standing up from a chair using your arms  (e.g., wheelchair or bedside chair)?: A Lot Help needed to walk in hospital room?: Total Help needed climbing 3-5 steps with a railing? : Total 6 Click Score: 9    End of Session Equipment Utilized During Treatment: Gait belt Activity Tolerance: Patient tolerated treatment well Patient left: in chair;with call bell/phone within reach;with chair alarm set;with family/visitor present Nurse Communication: Mobility status PT Visit Diagnosis: Unsteadiness on feet (R26.81);Other abnormalities of gait and mobility (R26.89);Muscle weakness (generalized) (M62.81);Difficulty in walking, not elsewhere classified (R26.2);Other symptoms and signs involving the nervous system (R29.898);Hemiplegia and hemiparesis Hemiplegia - Right/Left: Right Hemiplegia - dominant/non-dominant: Dominant Hemiplegia - caused by: Other cerebrovascular disease    Time: 1120-1143 PT Time Calculation (min) (ACUTE ONLY): 23 min   Charges:   PT Evaluation $PT Eval Moderate Complexity: 1 Mod   PT General Charges $$ ACUTE PT VISIT: 1 Visit         Wynn Maudlin, DPT Acute Rehabilitation Services Office [phone]  04/22/23 1:29 PM

## 2023-04-22 NOTE — Progress Notes (Signed)
SLP Cancellation Note  Patient Details Name: Timothy [name] MRN: [id number] DOB: 1956-07-16   Cancelled evaluation: Orders received for cognitive/language assessment; pt remains intubated. Will follow.  Daouda Lonzo L. Samson Frederic, MA CCC/SLP Clinical Specialist - Acute Care SLP Acute Rehabilitation Services Office number [phone]            Blenda Mounts Laurice 04/22/2023, 7:50 AM
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
## 2023-04-22 NOTE — Progress Notes (Signed)
Inpatient Rehab Admissions Coordinator Note:   Per therapy recommendations patient was screened for CIR candidacy by Stephania Fragmin, PT. At this time, pt appears to be a potential candidate for CIR. I will place an order for rehab consult for full assessment, per our protocol.  Please contact me any with questions.Estill Dooms, PT, DPT [phone] 04/22/23 1:48 PM

## 2023-04-22 NOTE — Progress Notes (Signed)
RN spoke with Dr. Otelia Limes of neurology. RN is titrating down on cleviprex, but earlier in shift RN had to titrate up due to patient's SBP being greater than 180. RN requested either PRN for blood pressure control or a renewed order for cleviprex. MD placed orders.

## 2023-04-22 NOTE — Progress Notes (Signed)
Referring Physician(s): CODE STROKE  Supervising Physician: Julieanne Cotton  Patient Status:  [facility] - In-pt  Chief Complaint: Code stroke L MCA occlusion  Subjective: Patient awake, alert but with expressive and receptive aphasia. Follows commands inconsistently and after significant prompting. Moving all extremities, remains weak on the right side.  Getting ECHO  Allergies: Patient has no known allergies.  Medications: Prior to Admission medications   Medication Sig Start Date End Date Taking? Authorizing Provider  amLODipine (NORVASC) 10 MG tablet Take 1 tablet (10 mg total) by mouth daily for 30 days. 12/24/18 04/21/23 Yes Arrien, York Ram, MD  carvedilol (COREG) 25 MG tablet Take 1 tablet (25 mg total) by mouth 2 (two) times daily with a meal for 30 days. 12/23/18 04/21/23 Yes Arrien, York Ram, MD  cholecalciferol (VITAMIN D3) 25 MCG (1000 UNIT) tablet Take 1,000 Units by mouth daily.   Yes [provider]  hydrALAZINE (APRESOLINE) 50 MG tablet Take 1 tablet (50 mg total) by mouth 3 (three) times daily. 07/04/21 04/21/23 Yes O'Neal, Ronnald Ramp, MD  isosorbide mononitrate (IMDUR) 60 MG 24 hr tablet TAKE ONE TABLET BY MOUTH ONCE DAILY. 04/02/22  Yes Jake Bathe, MD  pravastatin (PRAVACHOL) 20 MG tablet Take 20 mg by mouth daily. 06/25/22  Yes [provider]     Vital Signs: BP (!) 161/76   Pulse 63   Temp 99.2 F (37.3 C) (Axillary)   Resp 15   Ht 5\' 7"  (1.702 m)   Wt 151 lb (68.5 kg)   SpO2 100%   BMI 23.65 kg/m   Physical Exam NAD, alert, not consistently following commands. If follows a command there is significant delay in doing so.  Dysarthria.  Moving all extremities, remains weak on the right side.  Groin: soft, intact.  Site without evidence of hematoma or pseudoaneurysm.  Pulse: distal pulses intact.   Imaging: CT CEREBRAL PERFUSION W CONTRAST  Result Date: 04/21/2023 CLINICAL DATA:  Slurred speech, M2 occlusion EXAM:  CT PERFUSION BRAIN TECHNIQUE: Multiphase CT imaging of the brain was performed following IV bolus contrast injection. Subsequent parametric perfusion maps were calculated using RAPID software. RADIATION DOSE REDUCTION: This exam was performed according to the departmental dose-optimization program which includes automated exposure control, adjustment of the mA and/or kV according to patient size and/or use of iterative reconstruction technique. CONTRAST:  40mL OMNIPAQUE IOHEXOL 350 MG/ML SOLN COMPARISON:  Same day CTA head FINDINGS: CT Brain Perfusion Findings: CBF (<30%) Volume: 0mL Perfusion (Tmax>6.0s) volume: 54mL, some of which is artifactual in the right cerebellar hemisphere Mismatch Volume: 54mL ASPECTS on noncontrast CT Head: 9 at 11:50 am today. Infarct Core: 0 mL Infarction Location:The ischemic brain is primarily located in the left MCA distribution. Some of the reported penumbra is artifactual in the right cerebellar hemisphere. IMPRESSION: 54 cc ischemic brain identified primarily in the left MCA distribution, with some of this volume reflecting artifact in the right cerebellar hemisphere. No infarct core identified. Findings communicated to Dr Wilford Corner at 2:26 pm. Electronically Signed   By: Lesia Hausen M.D.   On: 04/21/2023 14:27   CT ANGIO HEAD NECK W WO CM  Result Date: 04/21/2023 CLINICAL DATA:  Aphasia EXAM: CT ANGIOGRAPHY HEAD AND NECK WITH AND WITHOUT CONTRAST TECHNIQUE: Multidetector CT imaging of the head and neck was performed using the standard protocol during bolus administration of intravenous contrast. Multiplanar CT image reconstructions and MIPs were obtained to evaluate the vascular anatomy. Carotid stenosis measurements (when applicable) are obtained utilizing NASCET  criteria, using the distal internal carotid diameter as the denominator. RADIATION DOSE REDUCTION: This exam was performed according to the departmental dose-optimization program which includes automated exposure  control, adjustment of the mA and/or kV according to patient size and/or use of iterative reconstruction technique. CONTRAST:  75mL OMNIPAQUE IOHEXOL 350 MG/ML SOLN COMPARISON:  Same-day noncontrast head CT FINDINGS: CTA NECK FINDINGS Aortic arch: There is mild calcified plaque in the imaged aortic arch. The origins of the major branch vessels are patent. The subclavian arteries are patent to the level imaged. Right carotid system: The right common, internal, and external carotid arteries are patent, with mild plaque at the bifurcation but no hemodynamically significant stenosis or occlusion there is no evidence of dissection or aneurysm. Left carotid system: The left common, internal, and external carotid arteries are patent, with mixed plaque at the bifurcation and proximal internal carotid artery resulting in approximately 30% stenosis. The distal internal carotid artery is patent with mild plaque proximal to the skull base. The external carotid artery is patent. There is no evidence of dissection or aneurysm. Vertebral arteries: The vertebral arteries are patent, without hemodynamically significant stenosis or occlusion. There is no evidence of dissection or aneurysm. Skeleton: There is no acute osseous abnormality or suspicious osseous lesion. There is multilevel degenerative change of the cervical spine. Other neck: A left occipital lipoma is noted. The soft tissues of the neck are otherwise unremarkable. Upper chest: The imaged lung apices are clear. Review of the MIP images confirms the above findings CTA HEAD FINDINGS Anterior circulation: There is calcified plaque in the intracranial ICAs resulting in mild-to-moderate stenosis bilaterally. The left M1 segment is patent. The superior M2 branch is occluded just after the bifurcation (7-73, 5-212). There is reconstitution of the smaller branch distal to the occlusion (7-67). The other distal MCA branches are patent. The right M1 segment and distal branches  are patent, without proximal stenosis or occlusion. The bilateral ACAS are patent, without proximal stenosis or occlusion. The anterior communicating artery is not definitely seen. There is no aneurysm or AVM. Posterior circulation: The bilateral V4 segments are patent. The basilar artery is patent with multifocal irregularity resulting in severe stenosis distally (5-224). The major cerebellar arteries appear patent. The bilateral PCAs are patent, without proximal stenosis or occlusion. The PCAs are primarily supplied by posterior communicating arteries bilaterally (fetal origins). There is no aneurysm or AVM. Venous sinuses: Patent. Anatomic variants: As above. Review of the MIP images confirms the above findings IMPRESSION: 1. Occlusion of the superior left M2 branch just after the bifurcation with reconstitution of the smaller branch distal to the occlusion. 2. 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 3. Calcified plaque in the intracranial ICAs resulting in mild-to-moderate stenosis bilaterally. 4. Mild plaque at the carotid bifurcations, left worse than right, without hemodynamically significant stenosis or occlusion. Patent vertebral arteries in the neck. These results were called by telephone at the time of interpretation on 04/21/2023 at 1:55 pm to provider Dr Wilford Corner, who verbally acknowledged these results. Electronically Signed   By: Lesia Hausen M.D.   On: 04/21/2023 13:58   CT HEAD WO CONTRAST  Result Date: 04/21/2023 CLINICAL DATA:  Neuro deficit, acute, stroke suspected EXAM: CT HEAD WITHOUT CONTRAST TECHNIQUE: Contiguous axial images were obtained from the base of the skull through the vertex without intravenous contrast. RADIATION DOSE REDUCTION: This exam was performed according to the departmental dose-optimization program which includes automated exposure control, adjustment of the mA and/or kV  according to patient size and/or use of iterative reconstruction  technique. COMPARISON:  12/21/2018 FINDINGS: Brain: Focal area of decreased gray-white matter differentiation within the posterior left frontal lobe (series 3, image 22). No additional sites of acute infarction are identified. No intracranial hemorrhage or extra-axial collection. No mass lesion or mass effect. No hydrocephalus. Patchy low-density changes within the periventricular and subcortical white matter most compatible with chronic microvascular ischemic change. Mild diffuse cerebral volume loss. Vascular: Atherosclerotic calcifications involving the large vessels of the skull base. No unexpected hyperdense vessel. Skull: Normal. Negative for fracture or focal lesion. Sinuses/Orbits: No acute finding. 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 IMPRESSION: 1. Focal area of decreased gray-white matter differentiation within the posterior left frontal lobe, concerning for acute or subacute infarction. MRI is recommended for further evaluation. 2. No intracranial hemorrhage. 3. Chronic microvascular ischemic change and cerebral volume loss. These results were called by telephone at the time of interpretation on 04/21/2023 at 12:07 pm to provider [facility] ZAMMIT , who verbally acknowledged these results. Electronically Signed   By: Duanne Guess D.O.   On: 04/21/2023 12:09    Labs:  CBC: Recent Labs    10/28/22 1640 03/14/23 1035 04/21/23 1140 04/21/23 1142 04/22/23 0726  WBC 4.7 6.3 5.0  --  7.9  HGB 14.6 15.8 15.6 16.3 14.5  HCT 44.3 48.0 48.2 48.0 43.5  PLT 151 150 151  --  152    COAGS: Recent Labs    04/21/23 1140  INR 1.1  APTT 38*    BMP: Recent Labs    10/28/22 1640 03/14/23 1035 04/21/23 1140 04/21/23 1142 04/22/23 0726  NA 135 135 136 139 134*  K 4.1 4.5 4.3 4.5 4.0  CL 102 102 105 106 101  CO2 25 23 24   --  20*  GLUCOSE 126* 167* 163* 160* 113*  BUN 18 25* 19 20 14   CALCIUM 8.8* 9.2 9.4  --   8.6*  CREATININE 1.56* 1.58* 1.41* 1.50* 1.28*  GFRNONAA 48* 47* 54*  --  >60    LIVER FUNCTION TESTS: Recent Labs    06/17/22 1412 07/16/22 1420 10/28/22 1640 04/21/23 1140  BILITOT 1.3* 1.3* 1.0 1.3*  AST 21 18 17 17   ALT 16 12 13 12   ALKPHOS 69 62 62 69  PROT 8.0 8.0 7.1 7.4  ALBUMIN 4.5 4.5 4.1 4.0    Assessment and Plan: L MCA occlusion s/p revascularization of the of the superior branch of the right MCA with mechanical thrombectomy achieving TICI 2C revascularization LVO unfortunately found by angiogram to have moved more distal.  Amenable clot removed. Patient with some improvement in function this AM.  Able to move the right side but considerably weaker than left.  Speech intelligible but with dysarthria and combination of expressive and receptive aphasia.  Wife at bedside able to ask questions which are answered by Dr. Corliss Skains.  Groin site intact.   Further care per Neuro  Electronically Signed: Hoyt Koch, PA 04/22/2023, 8:40 AM   I spent a total of 15 Minutes at the the patient's bedside AND on the patient's hospital floor or unit, greater than 50% of which was counseling/coordinating care for L MCA occlusion

## 2023-04-22 NOTE — Evaluation (Signed)
Occupational Therapy Evaluation Patient Details Name: Timothy [name] MRN: [id number] DOB: [birth year] Today's Date: 04/22/2023   History of Present Illness Patient is 67 y.o. 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 Pt s/p revascularization of the of the superior branch of the right MCA with mechanical thrombectomy achieving TICI 2C revascularization. PMH: ETOH abuse, CHF, CKD, HTN.   Clinical Impression   PTA Jayro lives independently with his wife. Pt demonstrates a significant functional decline requiring Mod A +2 for mobility and Mod to Max A with ADL tasks due to below listed deficits.  Mobility and participation improved as session progressed. Family present and began education regarding R inattention/neglect. VSS on RA. Patient will benefit from intensive inpatient follow up therapy, >3 hours/day. Acute OT to follow.       If plan is discharge home, recommend the following: A lot of help with walking and/or transfers;A lot of help with bathing/dressing/bathroom;Assistance with cooking/housework;Assistance with feeding;Direct supervision/assist for medications management;Assist for transportation;Direct supervision/assist for financial management;Help with stairs or ramp for entrance    Functional Status Assessment  Patient has had a recent decline in their functional status and demonstrates the ability to make significant improvements in function in a reasonable and predictable amount of time.  Equipment Recommendations  BSC/3in1    Recommendations for Other Services Rehab consult     Precautions / Restrictions Precautions Precautions: Fall Precaution Comments: R neglect; A-line; SBP 100-180      Mobility Bed Mobility Overal bed mobility: Needs Assistance Bed Mobility: Supine to Sit     Supine to sit: Mod assist, +2 for safety/equipment, Used rails     General bed mobility comments: improved as session progressed;  able to initate movement; leaning RUE behind him; assist with move hips to EOB; initially leaning R; able to correct midline postural control with min A    Transfers Overall transfer level: Needs assistance   Transfers: Sit to/from Stand, Bed to chair/wheelchair/BSC Sit to Stand: Min assist, +2 physical assistance     Step pivot transfers: Mod assist, +2 safety/equipment     General transfer comment: stepping iimproved with repetition      Balance Overall balance assessment: Needs assistance   Sitting balance-Leahy Scale: Fair       Standing balance-Leahy Scale: Poor                             ADL either performed or assessed with clinical judgement   ADL Overall ADL's : Needs assistance/impaired Eating/Feeding: NPO   Grooming: Moderate assistance   Upper Body Bathing: Moderate assistance   Lower Body Bathing: Moderate assistance;Sit to/from stand   Upper Body Dressing : Moderate assistance   Lower Body Dressing: Moderate assistance Lower Body Dressing Details (indicate cue type and reason): pt able to assist with donning B slippers Toilet Transfer: Moderate assistance;+2 for safety/equipment Toilet Transfer Details (indicate cue type and reason): simulated; urinal in room Toileting- Clothing Manipulation and Hygiene: Maximal assistance       Functional mobility during ADLs: Moderate assistance;+2 for safety/equipment;Cueing for safety;Cueing for sequencing       Vision Baseline Vision/History: 1 Wears glasses Vision Assessment?: Yes Eye Alignment: Within Functional Limits Ocular Range of Motion: Within Functional Limits Alignment/Gaze Preference: Head turned;Gaze left Tracking/Visual Pursuits: Decreased smoothness of horizontal tracking;Decreased smoothness of vertical tracking Saccades: Impaired - to be further tested in functional context Visual Fields: Right visual  field deficit (no blink to threat on R) Additional Comments: poor visual  attention to task; better in L field; has glasses ordered but does not have yet     Perception Perception: Impaired Preception Impairment Details: Inattention/Neglect, Spatial orientation     Praxis Praxis: Impaired Praxis Impairment Details: Perseveration, Motor planning     Pertinent Vitals/Pain Pain Assessment Pain Assessment: Faces Faces Pain Scale: No hurt     Extremity/Trunk Assessment Upper Extremity Assessment Upper Extremity Assessment: RUE deficits/detail;Right hand dominant RUE Deficits / Details: difficulty moving on command however able to activate RUE proximally > distalyy during familiar functional movement patterns; trying to use R hand to wash face however unable to grasp coth - noted shoulder activation RUE Sensation: decreased light touch;decreased proprioception RUE Coordination: decreased fine motor;decreased gross motor   Lower Extremity Assessment Lower Extremity Assessment: Defer to PT evaluation   Cervical / Trunk Assessment Cervical / Trunk Assessment: Other exceptions (R lateral bias; head turn L)   Communication Communication Communication: Difficulty following commands/understanding;Difficulty communicating thoughts/reduced clarity of speech Cueing Techniques: Verbal cues;Gestural cues;Tactile cues;Visual cues   Cognition Arousal: Alert Behavior During Therapy: Flat affect, Impulsive Overall Cognitive Status: Impaired/Different from baseline Area of Impairment: Attention, Safety/judgement, Awareness, Problem solving, Following commands                   Current Attention Level: Sustained   Following Commands: Follows one step commands inconsistently Safety/Judgement: Decreased awareness of deficits, Decreased awareness of safety Awareness: Intellectual Problem Solving: Slow processing, Difficulty sequencing, Requires verbal cues, Requires tactile cues       General Comments       Exercises     Shoulder Instructions      Home  Living Family/patient expects to be discharged to:: Private residence Living Arrangements: Spouse/significant other;Children Available Help at Discharge: Family;Available 24 hours/day Type of Home: House Home Access: Stairs to enter [employer] of Steps: 1 Entrance Stairs-Rails: None Home Layout: Two level;Full bath on main level;Bed/bath upstairs Alternate Level Stairs-Number of Steps: 13 Alternate Level Stairs-Rails: Right;Left Bathroom Shower/Tub: Tub/shower unit;Curtain   Bathroom Toilet: Standard Bathroom Accessibility: Yes How Accessible: Accessible via walker Home Equipment: None (Wife uses a rollator; RW)          Prior Functioning/Environment Prior Level of Function : Independent/Modified Independent;Driving;Other (comment) (retired)                        OT Problem List: Decreased range of motion;Decreased strength;Decreased activity tolerance;Impaired balance (sitting and/or standing);Impaired vision/perception;Decreased coordination;Decreased cognition;Decreased safety awareness;Decreased knowledge of use of DME or AE;Impaired sensation;Impaired UE functional use      OT Treatment/Interventions: Self-care/ADL training;Therapeutic exercise;Neuromuscular education;DME and/or AE instruction;Therapeutic activities;Cognitive remediation/compensation;Visual/perceptual remediation/compensation;Patient/family education;Balance training    OT Goals(Current goals can be found in the care plan section) Acute Rehab OT Goals Patient Stated Goal: per family for Timothy [name] to get better OT Goal Formulation: With family Time For Goal Achievement: 05/06/23 Potential to Achieve Goals: Good  OT Frequency: Min 1X/week    Co-evaluation PT/OT/SLP Co-Evaluation/Treatment: Yes Reason for Co-Treatment: Necessary to address cognition/behavior during functional activity;For patient/therapist safety;To address functional/ADL transfers   OT goals addressed during session:  ADL's and self-care      AM-PAC OT "6 Clicks" Daily Activity     Outcome Measure Help from another person eating meals?: Total Help from another person taking care of personal grooming?: A Lot Help from another person toileting, which includes using toliet, bedpan, or urinal?: A Lot Help  from another person bathing (including washing, rinsing, drying)?: A Lot Help from another person to put on and taking off regular upper body clothing?: A Lot Help from another person to put on and taking off regular lower body clothing?: A Lot 6 Click Score: 11   End of Session Equipment Utilized During Treatment: Gait belt Nurse Communication: Mobility status;Other (comment) (reconnect A-line)  Activity Tolerance: Patient tolerated treatment well Patient left: in chair;with call bell/phone within reach;with chair alarm set;with family/visitor present  OT Visit Diagnosis: Unsteadiness on feet (R26.81);Other abnormalities of gait and mobility (R26.89);Muscle weakness (generalized) (M62.81);Apraxia (R48.2);Other symptoms and signs involving the nervous system (R29.898);Other symptoms and signs involving cognitive function;Cognitive communication deficit (R41.841);Hemiplegia and hemiparesis Symptoms and signs involving cognitive functions: Cerebral infarction Hemiplegia - Right/Left: Right Hemiplegia - dominant/non-dominant: Dominant Hemiplegia - caused by: Cerebral infarction                Time: 1610-9604 OT Time Calculation (min): 30 min Charges:  OT General Charges $OT Visit: 1 Visit OT Evaluation $OT Eval Moderate Complexity: 1 Mod  Cari Burgo, OT/L   Acute OT Clinical Specialist Acute Rehabilitation Services Pager [phone] Office [phone]   [facility] 04/22/2023, 12:12 PM

## 2023-04-22 NOTE — Anesthesia Postprocedure Evaluation (Signed)
Anesthesia Post Note  Patient: Timothy [name]  Procedure(s) Performed: IR WITH ANESTHESIA     Patient location during evaluation: SICU Anesthesia Type: General Level of consciousness: sedated Pain management: pain level controlled Vital Signs Assessment: post-procedure vital signs reviewed and stable Respiratory status: patient remains intubated per anesthesia plan Cardiovascular status: stable Postop Assessment: no apparent nausea or vomiting Anesthetic complications: no   No notable events documented.  Last Vitals:  Vitals:   04/22/23 0815 04/22/23 0830  BP: (!) 151/79 (!) 161/76  Pulse: 66 63  Resp: 16 15  Temp:    SpO2: 100% 100%    Last Pain:  Vitals:   04/22/23 0800  TempSrc: Axillary  PainSc: 0-No pain                 [place]

## 2023-04-23 ENCOUNTER — Inpatient Hospital Stay (HOSPITAL_COMMUNITY): Payer: Medicare PPO

## 2023-04-23 DIAGNOSIS — I4891 Unspecified atrial fibrillation: Secondary | ICD-10-CM | POA: Diagnosis not present

## 2023-04-23 DIAGNOSIS — F1490 Cocaine use, unspecified, uncomplicated: Secondary | ICD-10-CM | POA: Diagnosis not present

## 2023-04-23 DIAGNOSIS — N189 Chronic kidney disease, unspecified: Secondary | ICD-10-CM

## 2023-04-23 DIAGNOSIS — I10 Essential (primary) hypertension: Secondary | ICD-10-CM

## 2023-04-23 DIAGNOSIS — E440 Moderate protein-calorie malnutrition: Secondary | ICD-10-CM | POA: Insufficient documentation

## 2023-04-23 DIAGNOSIS — I639 Cerebral infarction, unspecified: Secondary | ICD-10-CM | POA: Diagnosis not present

## 2023-04-23 DIAGNOSIS — E785 Hyperlipidemia, unspecified: Secondary | ICD-10-CM

## 2023-04-23 DIAGNOSIS — R1310 Dysphagia, unspecified: Secondary | ICD-10-CM | POA: Diagnosis not present

## 2023-04-23 LAB — MAGNESIUM
Magnesium: 1.9 mg/dL (ref 1.7–2.4)
Magnesium: 1.9 mg/dL (ref 1.7–2.4)

## 2023-04-23 LAB — PHOSPHORUS
Phosphorus: 3.7 mg/dL (ref 2.5–4.6)
Phosphorus: 3.8 mg/dL (ref 2.5–4.6)

## 2023-04-23 LAB — GLUCOSE, CAPILLARY
Glucose-Capillary: 126 mg/dL — ABNORMAL HIGH (ref 70–99)
Glucose-Capillary: 173 mg/dL — ABNORMAL HIGH (ref 70–99)

## 2023-04-23 MED ORDER — ISOSORBIDE MONONITRATE ER 60 MG PO TB24
60.0000 mg | ORAL_TABLET | Freq: Every day | ORAL | Status: DC
  Administered 2023-04-24 – 2023-04-26 (×3): 60 mg via ORAL
  Filled 2023-04-23 (×4): qty 1

## 2023-04-23 MED ORDER — THIAMINE MONONITRATE 100 MG PO TABS
100.0000 mg | ORAL_TABLET | Freq: Every day | ORAL | Status: DC
  Administered 2023-04-23 – 2023-04-28 (×6): 100 mg
  Filled 2023-04-23 (×6): qty 1

## 2023-04-23 MED ORDER — CARVEDILOL 12.5 MG PO TABS
12.5000 mg | ORAL_TABLET | Freq: Two times a day (BID) | ORAL | Status: DC
  Administered 2023-04-24: 12.5 mg via ORAL
  Filled 2023-04-23: qty 1

## 2023-04-23 MED ORDER — PRAVASTATIN SODIUM 40 MG PO TABS
40.0000 mg | ORAL_TABLET | Freq: Every day | ORAL | Status: DC
  Administered 2023-04-24 – 2023-04-26 (×3): 40 mg via ORAL
  Filled 2023-04-23 (×3): qty 1

## 2023-04-23 MED ORDER — ADULT MULTIVITAMIN W/MINERALS CH
1.0000 | ORAL_TABLET | Freq: Every day | ORAL | Status: DC
  Administered 2023-04-23 – 2023-04-28 (×6): 1
  Filled 2023-04-23 (×6): qty 1

## 2023-04-23 MED ORDER — HYDRALAZINE HCL 20 MG/ML IJ SOLN: 20.0000 mg | INTRAMUSCULAR | Status: DC | PRN

## 2023-04-23 MED ORDER — ASPIRIN 325 MG PO TABS
325.0000 mg | ORAL_TABLET | Freq: Every day | ORAL | Status: DC
  Administered 2023-04-24 – 2023-04-28 (×5): 325 mg
  Filled 2023-04-23 (×5): qty 1

## 2023-04-23 MED ORDER — OSMOLITE 1.5 CAL PO LIQD
1000.0000 mL | ORAL | Status: DC
  Administered 2023-04-23 – 2023-04-27 (×4): 1000 mL
  Filled 2023-04-23: qty 1000

## 2023-04-23 MED ORDER — FOLIC ACID 1 MG PO TABS
1.0000 mg | ORAL_TABLET | Freq: Every day | ORAL | Status: DC
  Administered 2023-04-23 – 2023-04-28 (×6): 1 mg
  Filled 2023-04-23 (×6): qty 1

## 2023-04-23 MED ORDER — PROSOURCE TF20 ENFIT COMPATIBL EN LIQD
60.0000 mL | Freq: Every day | ENTERAL | Status: DC
  Administered 2023-04-23 – 2023-04-28 (×6): 60 mL
  Filled 2023-04-23 (×6): qty 60

## 2023-04-23 NOTE — Evaluation (Signed)
Speech Language Pathology Evaluation Patient Details Name: Timothy [name] MRN: [id number] DOB: Feb 07, 1956 Today's Date: 04/23/2023 Time: 8413-2440 SLP Time Calculation (min) (ACUTE ONLY): 10 min  Problem List:  Patient Active Problem List   Diagnosis Date Noted   Stroke, acute, thrombotic (HCC) 04/21/2023   Acute ischemic stroke (HCC) 04/21/2023   Middle cerebral artery embolism, left 04/21/2023   Incarcerated left inguinal hernia 10/28/2022   Right inguinal hernia 07/17/2022   Chronic kidney disease, stage 3a (HCC) 07/17/2022   GERD (gastroesophageal reflux disease) 07/17/2022   Drug abuse (HCC) 07/17/2022   Nausea & vomiting 07/17/2022   Incarcerated hernia 07/17/2022   Small bowel obstruction (HCC) 07/16/2022   ERRONEOUS ENCOUNTER--DISREGARD 07/07/2022   Closed fracture of right distal radius 01/29/2022   Chronic systolic heart failure (HCC) 01/05/2022   AKI (acute kidney injury) (HCC) 12/22/2018   Alcohol withdrawal syndrome with complication, with unspecified complication (HCC) 12/22/2018   Secondary cardiomyopathy (HCC)    Typical atrial flutter (HCC)    Hypertensive emergency    Noncompliance    Acute renal failure superimposed on stage 3 chronic kidney disease (HCC)    Alcohol abuse    Hx of atrial flutter 12/21/2018   Essential hypertension, benign 10/29/2015   Mixed hyperlipidemia 10/29/2015   Obesity 10/29/2015   CKD (chronic kidney disease) 10/29/2015   Past Medical History:  Past Medical History:  Diagnosis Date   Alcohol abuse    Atrial fibrillation (HCC)    CHF (congestive heart failure) (HCC)    CKD (chronic kidney disease) stage 3, GFR 30-59 ml/min (HCC)    Hyperlipidemia    Hypertension    Noncompliance    Past Surgical History:  Past Surgical History:  Procedure Laterality Date   INGUINAL HERNIA REPAIR Right 07/17/2022   Procedure: HERNIA REPAIR INGUINAL INCARCERATED;  Surgeon: Lucretia Roers, MD;  Location: [facility];  Service: General;   Laterality: Right;   RADIOLOGY WITH ANESTHESIA N/A 04/21/2023   Procedure: IR WITH ANESTHESIA;  Surgeon: Radiologist, Medication, MD;  Location: [facility];  Service: Radiology;  Laterality: N/A;   XI ROBOTIC ASSISTED INGUINAL HERNIA REPAIR WITH MESH Left 03/19/2023   Procedure: XI ROBOTIC ASSISTED INGUINAL HERNIA REPAIR WITH MESH;  Surgeon: Franky Macho, MD;  Location: [facility];  Service: General;  Laterality: Left;   HPI:  Patient is 67 y.o. 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 Pt s/p revascularization of the of the superior branch of the right MCA with mechanical thrombectomy achieving TICI 2C revascularization. PMH: ETOH abuse, CHF, CKD, HTN.   Assessment / Plan / Recommendation Clinical Impression  Pt has a receptive and expressive aphasia with speech intelligibility also reduced secondary to low volume and imprecise articulation. Pt responded "yes" to most but not all simple yes/no questions and needed at least Mod cues for one-step commands, following them more with his LUE than orally. He said his name but had much more difficulty with confrontational naming. He named one item and was able to name two others when given sentence completion and phonemic cues. Throughout testing, pt was inattentive to his R side and did perform a little better when therapist was to his L. Pt would benefit from SLP f/u to maximize cognitive and communicative function.    SLP Assessment  SLP Recommendation/Assessment: Patient needs continued Speech Lanaguage Pathology Services SLP Visit Diagnosis: Aphasia (R47.01)    Recommendations for follow up therapy are one component of a multi-disciplinary  discharge planning process, led by the attending physician.  Recommendations may be updated based on patient status, additional functional criteria and insurance authorization.    Follow Up Recommendations  Acute inpatient rehab (3hours/day)    Assistance  Recommended at Discharge  Frequent or constant Supervision/Assistance  Functional Status Assessment Patient has had a recent decline in their functional status and demonstrates the ability to make significant improvements in function in a reasonable and predictable amount of time.  Frequency and Duration min 2x/week  2 weeks      SLP Evaluation Cognition  Overall Cognitive Status: Impaired/Different from baseline Arousal/Alertness: Awake/alert Orientation Level: Oriented to person Awareness: Impaired Awareness Impairment: Emergent impairment Comments: inattentive to R side       Comprehension  Auditory Comprehension Overall Auditory Comprehension: Impaired Yes/No Questions: Impaired Basic Biographical Questions: 26-50% accurate Basic Immediate Environment Questions: 25-49% accurate Commands: Impaired One Step Basic Commands: 25-49% accurate    Expression Expression Primary Mode of Expression: Verbal Verbal Expression Overall Verbal Expression: Impaired Automatic Speech: Name;Social Response Level of Generative/Spontaneous Verbalization: Word Repetition: Impaired Level of Impairment: Sentence level Naming: Impairment Confrontation: Impaired Pragmatics: Impairment Impairments: Eye contact;Abnormal affect   Oral / Motor  Oral Motor/Sensory Function Overall Oral Motor/Sensory Function:  (not following commands to adequately assess - does appear to have R facial droop) Motor Speech Overall Motor Speech: Impaired Respiration: Impaired Level of Impairment: Word Phonation: Low vocal intensity Articulation: Impaired Level of Impairment: Word Intelligibility: Intelligibility reduced Word: 50-74% accurate            Mahala Menghini., M.A. CCC-SLP Acute Rehabilitation Services Office [phone]  Secure chat preferred  04/23/2023, 11:39 AM

## 2023-04-23 NOTE — Progress Notes (Signed)
  Inpatient Rehabilitation Admissions Coordinator   Met with patient, wife and daughter at bedside for rehab assessment. We discussed goals and expectations of a possible CIR admit. They prefer CIR for rehab. Family can provide expected caregiver support that is recommended. I await further progress with therapy as well as medical readniness before beginning Auth with [facility]. We will follow up on Monday. Feel he would be a great candidate for CIR. Please call me with any questions.   Ottie Glazier, RN, MSN Rehab Admissions Coordinator [phone]

## 2023-04-23 NOTE — Progress Notes (Addendum)
Occupational Therapy Treatment Patient Details Name: Timothy [name] MRN: [id number] DOB: March 30, 1956 Today's Date: 04/23/2023   History of present illness Patient is 67 y.o. 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 Pt s/p revascularization of the of the superior branch of the right MCA with mechanical thrombectomy achieving TICI 2C revascularization. PMH: ETOH abuse, CHF, CKD, HTN.   OT comments  Pt supine in bed with spouse at side.  He is very fatigued today, maximal multimodal cueing required to sustain alertness when in bed.  Poor initiation to EOB, requiring max assist +2; but once upright pt able to stay awake and follow more simple 1 step commands with increased time. Max assist required for grooming tasks, simulated bimanual tasks; hand over hand at times with L hand to initiate and complete tasks.  Noted R UE edematous and positioned in elevation, will monitor for splinting needs (questionable 5th digit prior contracture?); no active movement seen in R UE today. Limited verbalizations during session.  Continue to recommend intensive >3hours/day therapy at dc. Will follow.       If plan is discharge home, recommend the following:  A lot of help with walking and/or transfers;A lot of help with bathing/dressing/bathroom;Assistance with cooking/housework;Assistance with feeding;Direct supervision/assist for medications management;Assist for transportation;Direct supervision/assist for financial management;Help with stairs or ramp for entrance   Equipment Recommendations  BSC/3in1    Recommendations for Other Services Rehab consult    Precautions / Restrictions Precautions Precautions: Fall Precaution Comments: R neglect; SBP <180 Restrictions Weight Bearing Restrictions: No       Mobility Bed Mobility Overal bed mobility: Needs Assistance Bed Mobility: Supine to Sit     Supine to sit: Max assist, +2 for physical  assistance, +2 for safety/equipment, HOB elevated     General bed mobility comments: pt not initating movement towards EOB today, requires max assist +2 using bed pads to assist to EOB    Transfers Overall transfer level: Needs assistance Equipment used: 2 person hand held assist Transfers: Sit to/from Stand, Bed to chair/wheelchair/BSC Sit to Stand: Min assist, +2 physical assistance     Step pivot transfers: Min assist, +2 physical assistance     General transfer comment: min assist +2 to rise, slow stepping towards L side with min assist +2     Balance Overall balance assessment: Needs assistance Sitting-balance support: No upper extremity supported, Feet supported Sitting balance-Leahy Scale: Poor Sitting balance - Comments: posterior bias and constant cueing to stay forward   Standing balance support: Bilateral upper extremity supported, During functional activity Standing balance-Leahy Scale: Poor                             ADL either performed or assessed with clinical judgement   ADL Overall ADL's : Needs assistance/impaired Eating/Feeding: NPO   Grooming: Maximal assistance;Sitting Grooming Details (indicate cue type and reason): pt able to use L UE but requires support for initation and sequencing today, hand over hand and perseverates on prior task         Upper Body Dressing : Maximal assistance;Sitting   Lower Body Dressing: Total assistance;+2 for physical assistance;+2 for safety/equipment;Sit to/from stand   Toilet Transfer: Minimal assistance;+2 for physical assistance;+2 for safety/equipment           Functional mobility during ADLs: Maximal assistance;+2 for physical assistance;+2 for safety/equipment      Extremity/Trunk Assessment  Vision   Additional Comments: able to identify # of finger in central gaze, but unsure on L and R   Perception Perception Perception: Impaired Preception Impairment Details:  Inattention/Neglect;Spatial orientation   Praxis Praxis Praxis: Impaired Praxis Impairment Details: Perseveration;Motor planning    Cognition Arousal: Lethargic Behavior During Therapy: Flat affect Overall Cognitive Status: Difficult to assess Area of Impairment: Attention, Safety/judgement, Awareness, Problem solving, Following commands                   Current Attention Level: Focused   Following Commands: Follows one step commands inconsistently, Follows one step commands with increased time Safety/Judgement: Decreased awareness of deficits, Decreased awareness of safety Awareness: Intellectual Problem Solving: Slow processing, Decreased initiation, Difficulty sequencing, Requires verbal cues, Requires tactile cues General Comments: Pt lethragic and requires max multimodal cueing to engage in simple 1 step commands.  Follows more commands once upright and OOB.  Voices yes/no, but otherwise very limited verbalizations.        Exercises      Shoulder Instructions       General Comments spouse at side    Pertinent Vitals/ Pain       Pain Assessment Pain Assessment: Faces Faces Pain Scale: No hurt  Home Living     Available Help at Discharge: Family;Available 24 hours/day Type of Home: House                              Lives With: Family    Prior Functioning/Environment              Frequency  Min 1X/week        Progress Toward Goals  OT Goals(current goals can now be found in the care plan section)  Progress towards OT goals: Progressing toward goals  Acute Rehab OT Goals Patient Stated Goal: per family get better OT Goal Formulation: With family Time For Goal Achievement: 05/06/23 Potential to Achieve Goals: Good  Plan      Co-evaluation    PT/OT/SLP Co-Evaluation/Treatment: Yes Reason for Co-Treatment: Necessary to address cognition/behavior during functional activity;For patient/therapist safety   OT goals addressed  during session: ADL's and self-care      AM-PAC OT "6 Clicks" Daily Activity     Outcome Measure   Help from another person eating meals?: Total Help from another person taking care of personal grooming?: A Lot Help from another person toileting, which includes using toliet, bedpan, or urinal?: Total Help from another person bathing (including washing, rinsing, drying)?: A Lot Help from another person to put on and taking off regular upper body clothing?: A Lot Help from another person to put on and taking off regular lower body clothing?: Total 6 Click Score: 9    End of Session Equipment Utilized During Treatment: Gait belt  OT Visit Diagnosis: Unsteadiness on feet (R26.81);Other abnormalities of gait and mobility (R26.89);Muscle weakness (generalized) (M62.81);Apraxia (R48.2);Other symptoms and signs involving the nervous system (R29.898);Other symptoms and signs involving cognitive function;Cognitive communication deficit (R41.841);Hemiplegia and hemiparesis Symptoms and signs involving cognitive functions: Cerebral infarction Hemiplegia - Right/Left: Right Hemiplegia - dominant/non-dominant: Dominant Hemiplegia - caused by: Cerebral infarction   Activity Tolerance Patient limited by fatigue   Patient Left in chair;with call bell/phone within reach;with chair alarm set;with family/visitor present   Nurse Communication Mobility status        Time: 1610-9604 OT Time Calculation (min): 31 min  Charges: OT General Charges $OT Visit: 1 Visit  OT Treatments $Self Care/Home Management : 8-22 mins  Barry Brunner, OT Acute Rehabilitation Services Office [phone]   Timothy [name] 04/23/2023, 1:51 PM

## 2023-04-23 NOTE — Progress Notes (Addendum)
Initial Nutrition Assessment  DOCUMENTATION CODES:  Non-severe (moderate) malnutrition in context of social or environmental circumstances  INTERVENTION:  Initiate tube feeding via cortrak tube: Osmolite 1.5 at 55 ml/h (1320 ml per day) Prosource TF20 60 ml 1x/d Provides 2060 kcal, 103 gm protein, 1006 ml free water daily Thiamine 100mg , folic acid 1mg , and MVI daily for hx of EtOH and drug abuse Monitor K, Mg, and phosphorus q12h and MD to replace as needed   NUTRITION DIAGNOSIS:  Moderate Malnutrition related to social / environmental circumstances as evidenced by mild fat depletion, moderate muscle depletion, percent weight loss (10% x 6 months).  GOAL:  Patient will meet greater than or equal to 90% of their needs  MONITOR:  Labs, Weight trends, Skin, TF tolerance  REASON FOR ASSESSMENT:  New TF (cortrak)    ASSESSMENT:  Pt with hx of HTN, HLD, atrial fibrillation, CHF, CKD3, and polysubstance abuse presented to ED after a fall with right-sided facial droop and slurred speech. Found to have a left MCA infarct with L M2 occlusion  9/18 - S/P IR for thrombectomy, returned to unit on vent 9/19 - extubated  Pt resting in bed at the time of assessment, just finished having cortrak placed. Pt able to answer some questions, but family returned and was able to assist with nutrition hx. Reports that pt has lost weight this year prior to having his hernia surgery ~ 1 month ago. Reports that pt was having a lot of issues with vomiting but has been doing much better since his surgery. UBW prior to weight loss was 167 lb. Regularly eats 3x/d.   Muscle and fat deficits present on exam consistent with malnutrition. Will initiate TF slowly and monitor magnesium and phosphorus for signs of refeeding.    Intake/Output Summary (Last 24 hours) at 04/23/2023 1313 Last data filed at 04/23/2023 1200 Gross per 24 hour  Intake 1440.26 ml  Output 1860 ml  Net -419.74 ml  Net IO Since Admission:  -2,783.25 mL [04/23/23 1313]  Admit / Current weight: 68.5 kg Noted a 9.9% weight loss over the last 6 months (10/2022-04/2023)  Nutritionally Relevant Medications: Continuous Infusions:  sodium chloride 50 mL/hr at 04/23/23 1200   clevidipine Stopped (04/23/23 1157)   Labs Reviewed: Na 134 Creatinine 1.28  NUTRITION - FOCUSED PHYSICAL EXAM: Flowsheet Row Most Recent Value  Orbital Region Mild depletion  Upper Arm Region Mild depletion  Thoracic and Lumbar Region Mild depletion  Buccal Region No depletion  Temple Region No depletion  Clavicle Bone Region Mild depletion  Clavicle and Acromion Bone Region Mild depletion  Scapular Bone Region No depletion  Dorsal Hand Mild depletion  Patellar Region Moderate depletion  Anterior Thigh Region Moderate depletion  Posterior Calf Region Mild depletion  Edema (RD Assessment) None  Hair Reviewed  Eyes Reviewed  Mouth Reviewed  Skin Reviewed  Nails Reviewed    Diet Order:   Diet Order             Diet NPO time specified  Diet effective now                   EDUCATION NEEDS:  Not appropriate for education at this time  Skin:  Skin Assessment: Reviewed RN Assessment  Last BM:  PTA  Height:  Ht Readings from Last 1 Encounters:  04/21/23 5\' 7"  (1.702 m)    Weight:  Wt Readings from Last 1 Encounters:  04/21/23 68.5 kg    Ideal Body Weight:  67.3  kg  BMI:  Body mass index is 23.65 kg/m.  Estimated Nutritional Needs:  Kcal:  1900-2200 kcal/d Protein:  100-115g/d Fluid:  2-2.2L/d    Greig Castilla, RD, LDN Clinical Dietitian RD pager # available in [facility]  After hours/weekend pager # available in [facility]

## 2023-04-23 NOTE — TOC CM/SW Note (Signed)
Transition of Care [facility]) - Inpatient Brief Assessment   Patient Details  Name: Timothy [name] MRN: [id number] Date of Birth: 1955/09/25  Transition of Care [facility]) CM/SW Contact:    Mearl Latin, LCSW Phone Number: 04/23/2023, 1:46 PM   Clinical Narrative: Patient admitted from home. CIR is assessing patient. Will continue to follow.    Transition of Care Asessment: Insurance and Status: Insurance coverage has been reviewed Patient has primary care physician: Yes Home environment has been reviewed: From homw   Prior/Current Home Services: No current home services Social Determinants of Health Reivew: SDOH reviewed no interventions necessary Readmission risk has been reviewed: Yes Transition of care needs: transition of care needs identified, TOC will continue to follow

## 2023-04-23 NOTE — Progress Notes (Signed)
Physical Therapy Treatment Patient Details Name: Timothy [name] MRN: [id number] DOB: Dec 12, 1955 Today's Date: 04/23/2023   History of Present Illness Patient is 67 y.o. 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 Pt s/p revascularization of the of the superior branch of the right MCA with mechanical thrombectomy achieving TICI 2C revascularization. PMH: ETOH abuse, CHF, CKD, HTN.    PT Comments  Patient more lethargic today but with max encouragement agreeable to therapy. No initiation to use Lt UE/LE at start and max tactile cues for pt to use Lt UE and no activation noted for LE's in supine. Max+2 required to pivot with bed pad supine>sit. Once EOB pt slightly more awake and able to follow cues to extend Lt LE for LAQ, pt attempted LAQ on Rt as well with partial ROM achieved. Min +2 assist for sit<>stand today and pivot bed>chair. Pt required mod assist to weight shift Rt/Lt to facilitate small shuffled steps towards Lt. Once in recliner pt able to use Lt UE and lean anteriorly to scoot hips back in recliner. EOS Alarm on and call bell within reach and wife present, pt drifting to sleep in recliner due to fatigue. Will continue to progress pt as able.    If plan is discharge home, recommend the following: A lot of help with walking and/or transfers;A lot of help with bathing/dressing/bathroom;Assistance with cooking/housework;Direct supervision/assist for medications management;Assist for transportation;Help with stairs or ramp for entrance;Supervision due to cognitive status   Can travel by private vehicle        Equipment Recommendations  Other (comment) (defer to next venue)    Recommendations for Other Services Rehab consult     Precautions / Restrictions Precautions Precautions: Fall Precaution Comments: R neglect; SBP <180 Restrictions Weight Bearing Restrictions: No     Mobility  Bed Mobility Overal bed mobility: Needs  Assistance Bed Mobility: Supine to Sit     Supine to sit: Max assist, +2 for physical assistance, +2 for safety/equipment, HOB elevated     General bed mobility comments: pt not initating movement towards EOB today, requires max assist +2 using bed pads to assist to EOB    Transfers Overall transfer level: Needs assistance Equipment used: 2 person hand held assist Transfers: Sit to/from Stand, Bed to chair/wheelchair/BSC Sit to Stand: Min assist, +2 physical assistance   Step pivot transfers: Min assist, +2 physical assistance       General transfer comment: min assist +2 to rise, slow stepping towards L side with min assist +2    Ambulation/Gait                   Stairs             Wheelchair Mobility     Tilt Bed    Modified Rankin (Stroke Patients Only)       Balance Overall balance assessment: Needs assistance Sitting-balance support: No upper extremity supported, Feet supported Sitting balance-Leahy Scale: Poor Sitting balance - Comments: posterior bias and constant cueing to stay forward   Standing balance support: Bilateral upper extremity supported, During functional activity Standing balance-Leahy Scale: Poor                              Cognition Arousal: Lethargic Behavior During Therapy: Flat affect Overall Cognitive Status: Difficult to assess Area of Impairment: Attention, Safety/judgement, Awareness, Problem solving, Following commands  Current Attention Level: Focused   Following Commands: Follows one step commands inconsistently, Follows one step commands with increased time Safety/Judgement: Decreased awareness of deficits, Decreased awareness of safety Awareness: Intellectual Problem Solving: Slow processing, Decreased initiation, Difficulty sequencing, Requires verbal cues, Requires tactile cues General Comments: Pt lethragic and requires max multimodal cueing to engage in simple 1 step  commands.  Follows more commands once upright and OOB.  Voices yes/no, but otherwise very limited verbalizations.        Exercises General Exercises - Lower Extremity Long Arc Quad: Left, 5 reps, Seated    General Comments General comments (skin integrity, edema, etc.): spouse, Darel Hong, present      Pertinent Vitals/Pain Pain Assessment Pain Assessment: Faces Faces Pain Scale: No hurt Pain Intervention(s): Monitored during session, Repositioned    Home Living   Living Arrangements:  (spouse , daughter and grandchildren) Available Help at Discharge: Family;Available 24 hours/day Type of Home: House                  Prior Function            PT Goals (current goals can now be found in the care plan section) Acute Rehab PT Goals Patient Stated Goal: recover and get home PT Goal Formulation: With family Time For Goal Achievement: 05/06/23 Potential to Achieve Goals: Good Progress towards PT goals: Progressing toward goals    Frequency    Min 1X/week      PT Plan      Co-evaluation PT/OT/SLP Co-Evaluation/Treatment: Yes Reason for Co-Treatment: Necessary to address cognition/behavior during functional activity;For patient/therapist safety   OT goals addressed during session: ADL's and self-care      AM-PAC PT "6 Clicks" Mobility   Outcome Measure  Help needed turning from your back to your side while in a flat bed without using bedrails?: A Lot Help needed moving from lying on your back to sitting on the side of a flat bed without using bedrails?: A Lot Help needed moving to and from a bed to a chair (including a wheelchair)?: A Lot Help needed standing up from a chair using your arms (e.g., wheelchair or bedside chair)?: A Lot Help needed to walk in hospital room?: Total Help needed climbing 3-5 steps with a railing? : Total 6 Click Score: 10    End of Session Equipment Utilized During Treatment: Gait belt Activity Tolerance: Patient tolerated  treatment well Patient left: in chair;with call bell/phone within reach;with chair alarm set;with family/visitor present Nurse Communication: Mobility status PT Visit Diagnosis: Unsteadiness on feet (R26.81);Other abnormalities of gait and mobility (R26.89);Muscle weakness (generalized) (M62.81);Difficulty in walking, not elsewhere classified (R26.2);Other symptoms and signs involving the nervous system (R29.898);Hemiplegia and hemiparesis Hemiplegia - Right/Left: Right Hemiplegia - dominant/non-dominant: Dominant Hemiplegia - caused by: Other cerebrovascular disease     Time: 1258-1329 PT Time Calculation (min) (ACUTE ONLY): 31 min  Charges:    $Therapeutic Activity: 8-22 mins PT General Charges $$ ACUTE PT VISIT: 1 Visit                     Wynn Maudlin, DPT Acute Rehabilitation Services Office [phone]  04/23/23 2:12 PM

## 2023-04-23 NOTE — Evaluation (Signed)
Clinical/Bedside Swallow Evaluation Patient Details  Name: Timothy [name] MRN: [id number] Date of Birth: 10-Dec-1955  Today's Date: 04/23/2023 Time: SLP Start Time (ACUTE ONLY): 1054 SLP Stop Time (ACUTE ONLY): 1108 SLP Time Calculation (min) (ACUTE ONLY): 14 min  Past Medical History:  Past Medical History:  Diagnosis Date   Alcohol abuse    Atrial fibrillation (HCC)    CHF (congestive heart failure) (HCC)    CKD (chronic kidney disease) stage 3, GFR 30-59 ml/min (HCC)    Hyperlipidemia    Hypertension    Noncompliance    Past Surgical History:  Past Surgical History:  Procedure Laterality Date   INGUINAL HERNIA REPAIR Right 07/17/2022   Procedure: HERNIA REPAIR INGUINAL INCARCERATED;  Surgeon: Lucretia Roers, MD;  Location: [facility];  Service: General;  Laterality: Right;   RADIOLOGY WITH ANESTHESIA N/A 04/21/2023   Procedure: IR WITH ANESTHESIA;  Surgeon: Radiologist, Medication, MD;  Location: [facility];  Service: Radiology;  Laterality: N/A;   XI ROBOTIC ASSISTED INGUINAL HERNIA REPAIR WITH MESH Left 03/19/2023   Procedure: XI ROBOTIC ASSISTED INGUINAL HERNIA REPAIR WITH MESH;  Surgeon: Franky Macho, MD;  Location: [facility];  Service: General;  Laterality: Left;   HPI:  Patient is 67 y.o. 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 Pt s/p revascularization of the of the superior branch of the right MCA with mechanical thrombectomy achieving TICI 2C revascularization. PMH: ETOH abuse, CHF, CKD, HTN.    Assessment / Plan / Recommendation  Clinical Impression  Pt has signs of an oropharyngeal dysphagia that include reduced movement and sensation to R side of face. He has some saliva pooling in his oral cavity at baseline with mild R buccal pocketing and R loss noted with purees, of which pt shows no awareness. Delayed throat clearing is observed, starting after a few spoonfuls of purees. Thin liquids elicit several subswallows per  bolus and subsequent coughing. Cues were given intermittently for pt to swallow, improving as trials continued. Given concern for dysphagia with impaired sensation, as well as signs of possible aspiration, recommend that he remain NPO for now. MBS will likely be indicated, but pt may benefit from a little more time and practice prior to completing. Would focus on oral care and nurse could provide a few pieces of ice immediately after oral care. SLP Visit Diagnosis: Dysphagia, unspecified (R13.10)    Aspiration Risk  Moderate aspiration risk    Diet Recommendation NPO;Alternative means - temporary;Ice chips PRN after oral care (few pieces of ice at a time from nurse)    Medication Administration: Via alternative means    Other  Recommendations Oral Care Recommendations: Oral care QID Caregiver Recommendations: Have oral suction available    Recommendations for follow up therapy are one component of a multi-disciplinary discharge planning process, led by the attending physician.  Recommendations may be updated based on patient status, additional functional criteria and insurance authorization.  Follow up Recommendations Acute inpatient rehab (3hours/day)      Assistance Recommended at Discharge    Functional Status Assessment Patient has had a recent decline in their functional status and demonstrates the ability to make significant improvements in function in a reasonable and predictable amount of time.  Frequency and Duration min 2x/week  2 weeks       Prognosis Prognosis for improved oropharyngeal function: Good Barriers to Reach Goals: Language deficits;Cognitive deficits      Swallow Study   General  HPI: Patient is 67 y.o. 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 Pt s/p revascularization of the of the superior branch of the right MCA with mechanical thrombectomy achieving TICI 2C revascularization. PMH: ETOH abuse, CHF,  CKD, HTN. Type of Study: Bedside Swallow Evaluation Previous Swallow Assessment: none in chart Diet Prior to this Study: NPO Temperature Spikes Noted: Yes (102.6) Respiratory Status: Room air History of Recent Intubation: Yes Total duration of intubation (days):  (for procedure only on 9/18) Date extubated: 04/21/23 Behavior/Cognition: Alert;Cooperative;Requires cueing Oral Cavity Assessment: Excessive secretions Oral Care Completed by SLP: No Oral Cavity - Dentition: Poor condition;Missing dentition Vision: Impaired for self-feeding (inattentive to L side of environment) Self-Feeding Abilities: Needs assist Patient Positioning: Upright in bed Baseline Vocal Quality: Low vocal intensity Volitional Cough: Strong Volitional Swallow: Unable to elicit (can at times - inconsistent)    Oral/Motor/Sensory Function Overall Oral Motor/Sensory Function:  (not following commands to adequately assess - does appear to have R facial droop)   Ice Chips Ice chips: Within functional limits Presentation: Spoon   Thin Liquid Thin Liquid: Impaired Presentation: Spoon;Straw Pharyngeal  Phase Impairments: Multiple swallows;Cough - Immediate    Nectar Thick Nectar Thick Liquid: Not tested   Honey Thick Honey Thick Liquid: Not tested   Puree Puree: Impaired Presentation: Spoon Oral Phase Impairments: Poor awareness of bolus;Reduced labial seal Oral Phase Functional Implications: Right anterior spillage;Right lateral sulci pocketing;Oral holding Pharyngeal Phase Impairments: Throat Clearing - Delayed   Solid     Solid: Not tested      Mahala Menghini., M.A. CCC-SLP Acute Rehabilitation Services Office [phone]  Secure chat preferred  04/23/2023,11:30 AM
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
## 2023-04-23 NOTE — Procedures (Signed)
Cortrak  Person Inserting Tube:  Mahala Menghini, RD Tube Type:  Cortrak - 43 inches Tube Size:  10 Tube Location:  Left nare Secured by: Bridle Technique Used to Measure Tube Placement:  Marking at nare/corner of mouth Cortrak Secured At:  60 cm   Cortrak Tube Team Note:  Consult received to place a Cortrak feeding tube.   X-ray is required, abdominal x-ray has been ordered by the Cortrak team. Please confirm tube placement before using the Cortrak tube.   If the tube becomes dislodged please keep the tube and contact the Cortrak team at [email or web address] for replacement.  If after hours and replacement cannot be delayed, place a NG tube and confirm placement with an abdominal x-ray.    Mertie Clause, MS, RD, LDN Registered Dietitian II Please see [employer] for contact information.

## 2023-04-24 DIAGNOSIS — I639 Cerebral infarction, unspecified: Secondary | ICD-10-CM | POA: Diagnosis not present

## 2023-04-24 LAB — MAGNESIUM
Magnesium: 2 mg/dL (ref 1.7–2.4)
Magnesium: 2.1 mg/dL (ref 1.7–2.4)

## 2023-04-24 LAB — GLUCOSE, CAPILLARY
Glucose-Capillary: 179 mg/dL — ABNORMAL HIGH (ref 70–99)
Glucose-Capillary: 211 mg/dL — ABNORMAL HIGH (ref 70–99)
Glucose-Capillary: 179 mg/dL — ABNORMAL HIGH (ref 70–99)
Glucose-Capillary: 152 mg/dL — ABNORMAL HIGH (ref 70–99)
Glucose-Capillary: 158 mg/dL — ABNORMAL HIGH (ref 70–99)
Glucose-Capillary: 174 mg/dL — ABNORMAL HIGH (ref 70–99)
Glucose-Capillary: 169 mg/dL — ABNORMAL HIGH (ref 70–99)

## 2023-04-24 LAB — PHOSPHORUS
Phosphorus: 3.5 mg/dL (ref 2.5–4.6)
Phosphorus: 3.7 mg/dL (ref 2.5–4.6)

## 2023-04-24 NOTE — Progress Notes (Signed)
Speech Language Pathology Treatment: Dysphagia  Patient Details Name: Timothy [name] MRN: [id number] DOB: 23-Sep-1955 Today's Date: 04/24/2023 Time: 9528-4132 SLP Time Calculation (min) (ACUTE ONLY): 14 min  Assessment / Plan / Recommendation Clinical Impression  Pt seen for skilled ST services for PO trials. Pt is currently NPO and was administered ice chips. Immediately prior to SLP arrival, the nurse administered oral care. The pt received additional oral care and mouth moistener to address xerostomia. Pt instructed to use effortful swallows for all PO trials. Pt had no s/sx of aspiration in 3/5 trials, on the last 2 trials the pt had x1 slight cough and x1 strained choking cough, PO trials stopped to reduce aspiration risk. Pt reports that it felt "uncomfortable" on the last trial. Diet recs remain the same, NPO with frequent oral care. SLP to follow up with more PO trials and aphasia tx.    HPI HPI: Patient is 67 y.o. 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 Pt s/p revascularization of the of the superior branch of the right MCA with mechanical thrombectomy achieving TICI 2C revascularization. PMH: ETOH abuse, CHF, CKD, HTN.      SLP Plan  Continue with current plan of care      Recommendations for follow up therapy are one component of a multi-disciplinary discharge planning process, led by the attending physician.  Recommendations may be updated based on patient status, additional functional criteria and insurance authorization.    Recommendations  Diet recommendations: NPO Medication Administration: Via alternative means                              Continue with current plan of care     Dione Housekeeper M.S. CCC-SLP

## 2023-04-24 NOTE — Plan of Care (Signed)
Problem: Education: Goal: Knowledge of disease or condition will improve Outcome: Progressing Goal: Knowledge of secondary prevention will improve (MUST DOCUMENT ALL) Outcome: Progressing Goal: Knowledge of patient specific risk factors will improve Loraine Leriche N/A or DELETE if not current risk factor) Outcome: Progressing   Problem: Ischemic Stroke/TIA Tissue Perfusion: Goal: Complications of ischemic stroke/TIA will be minimized Outcome: Progressing   Problem: Coping: Goal: Will verbalize positive feelings about self Outcome: Progressing Goal: Will identify appropriate support needs Outcome: Progressing   Problem: Health Behavior/Discharge Planning: Goal: Ability to manage health-related needs will improve Outcome: Progressing Goal: Goals will be collaboratively established with patient/family Outcome: Progressing   Problem: Self-Care: Goal: Ability to participate in self-care as condition permits will improve Outcome: Progressing Goal: Verbalization of feelings and concerns over difficulty with self-care will improve Outcome: Progressing Goal: Ability to communicate needs accurately will improve Outcome: Progressing   Problem: Nutrition: Goal: Risk of aspiration will decrease Outcome: Progressing Goal: Dietary intake will improve Outcome: Progressing   Problem: Education: Goal: Understanding of CV disease, CV risk reduction, and recovery process will improve Outcome: Progressing Goal: Individualized Educational Video(s) Outcome: Progressing   Problem: Activity: Goal: Ability to return to baseline activity level will improve Outcome: Progressing   Problem: Cardiovascular: Goal: Ability to achieve and maintain adequate cardiovascular perfusion will improve Outcome: Progressing Goal: Vascular access site(s) Level 0-1 will be maintained Outcome: Progressing   Problem: Health Behavior/Discharge Planning: Goal: Ability to safely manage health-related needs after  discharge will improve Outcome: Progressing   Problem: Education: Goal: Knowledge of General Education information will improve Description: Including pain rating scale, medication(s)/side effects and non-pharmacologic comfort measures Outcome: Progressing   Problem: Health Behavior/Discharge Planning: Goal: Ability to manage health-related needs will improve Outcome: Progressing   Problem: Clinical Measurements: Goal: Ability to maintain clinical measurements within normal limits will improve Outcome: Progressing Goal: Will remain free from infection Outcome: Progressing Goal: Diagnostic test results will improve Outcome: Progressing Goal: Respiratory complications will improve Outcome: Progressing Goal: Cardiovascular complication will be avoided Outcome: Progressing   Problem: Activity: Goal: Risk for activity intolerance will decrease Outcome: Progressing   Problem: Nutrition: Goal: Adequate nutrition will be maintained Outcome: Progressing   Problem: Coping: Goal: Level of anxiety will decrease Outcome: Progressing   Problem: Elimination: Goal: Will not experience complications related to bowel motility Outcome: Progressing Goal: Will not experience complications related to urinary retention Outcome: Progressing   Problem: Pain Managment: Goal: General experience of comfort will improve Outcome: Progressing   Problem: Safety: Goal: Ability to remain free from injury will improve Outcome: Progressing   Problem: Skin Integrity: Goal: Risk for impaired skin integrity will decrease Outcome: Progressing

## 2023-04-24 NOTE — Plan of Care (Signed)
Problem: Education: Goal: Knowledge of disease or condition will improve Outcome: Progressing   Problem: Coping: Goal: Will verbalize positive feelings about self Outcome: Progressing   Problem: Health Behavior/Discharge Planning: Goal: Ability to manage health-related needs will improve Outcome: Progressing
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
## 2023-04-25 DIAGNOSIS — I639 Cerebral infarction, unspecified: Secondary | ICD-10-CM | POA: Diagnosis not present

## 2023-04-25 LAB — CBC WITH DIFFERENTIAL/PLATELET
Abs Immature Granulocytes: 0.02 10*3/uL (ref 0.00–0.07)
Basophils Absolute: 0.1 10*3/uL (ref 0.0–0.1)
Basophils Relative: 2 %
Eosinophils Absolute: 0.2 10*3/uL (ref 0.0–0.5)
Eosinophils Relative: 4 %
HCT: 41.3 % (ref 39.0–52.0)
Hemoglobin: 13.8 g/dL (ref 13.0–17.0)
Immature Granulocytes: 0 %
Lymphocytes Relative: 30 %
Lymphs Abs: 1.5 10*3/uL (ref 0.7–4.0)
MCH: 27.5 pg (ref 26.0–34.0)
MCHC: 33.4 g/dL (ref 30.0–36.0)
MCV: 82.3 fL (ref 80.0–100.0)
Monocytes Absolute: 0.6 10*3/uL (ref 0.1–1.0)
Monocytes Relative: 12 %
Neutro Abs: 2.7 10*3/uL (ref 1.7–7.7)
Neutrophils Relative %: 52 %
Platelets: 115 10*3/uL — ABNORMAL LOW (ref 150–400)
RBC: 5.02 MIL/uL (ref 4.22–5.81)
RDW: 13.6 % (ref 11.5–15.5)
WBC: 5 10*3/uL (ref 4.0–10.5)
nRBC: 0 % (ref 0.0–0.2)

## 2023-04-25 LAB — PHOSPHORUS
Phosphorus: 3.9 mg/dL (ref 2.5–4.6)
Phosphorus: 3.4 mg/dL (ref 2.5–4.6)

## 2023-04-25 LAB — BASIC METABOLIC PANEL
Anion gap: 7 (ref 5–15)
BUN: 35 mg/dL — ABNORMAL HIGH (ref 8–23)
CO2: 26 mmol/L (ref 22–32)
Calcium: 8.6 mg/dL — ABNORMAL LOW (ref 8.9–10.3)
Chloride: 102 mmol/L (ref 98–111)
Creatinine, Ser: 1.71 mg/dL — ABNORMAL HIGH (ref 0.61–1.24)
GFR, Estimated: 43 mL/min — ABNORMAL LOW (ref >60)
Glucose, Bld: 159 mg/dL — ABNORMAL HIGH (ref 70–99)
Potassium: 3.7 mmol/L (ref 3.5–5.1)
Sodium: 135 mmol/L (ref 135–145)

## 2023-04-25 LAB — GLUCOSE, CAPILLARY
Glucose-Capillary: 121 mg/dL — ABNORMAL HIGH (ref 70–99)
Glucose-Capillary: 149 mg/dL — ABNORMAL HIGH (ref 70–99)
Glucose-Capillary: 159 mg/dL — ABNORMAL HIGH (ref 70–99)
Glucose-Capillary: 173 mg/dL — ABNORMAL HIGH (ref 70–99)
Glucose-Capillary: 177 mg/dL — ABNORMAL HIGH (ref 70–99)
Glucose-Capillary: 178 mg/dL — ABNORMAL HIGH (ref 70–99)
Glucose-Capillary: 179 mg/dL — ABNORMAL HIGH (ref 70–99)
Glucose-Capillary: 188 mg/dL — ABNORMAL HIGH (ref 70–99)

## 2023-04-25 LAB — MAGNESIUM
Magnesium: 2.2 mg/dL (ref 1.7–2.4)
Magnesium: 2.1 mg/dL (ref 1.7–2.4)

## 2023-04-25 MED ORDER — FREE WATER
200.0000 mL | Freq: Four times a day (QID) | Status: DC
  Administered 2023-04-25 – 2023-04-28 (×13): 200 mL

## 2023-04-25 MED ORDER — SODIUM CHLORIDE 0.9 % IV BOLUS
500.0000 mL | Freq: Once | INTRAVENOUS | Status: AC
  Administered 2023-04-25: 500 mL via INTRAVENOUS

## 2023-04-25 NOTE — Plan of Care (Signed)
  Problem: Education: Goal: Knowledge of secondary prevention will improve (MUST DOCUMENT ALL) Outcome: Progressing Goal: Knowledge of patient specific risk factors will improve Timothy [name] N/A or DELETE if not current risk factor) Outcome: Progressing   Problem: Ischemic Stroke/TIA Tissue Perfusion: Goal: Complications of ischemic stroke/TIA will be minimized Outcome: Progressing

## 2023-04-25 NOTE — Plan of Care (Signed)
  Problem: Education: Goal: Knowledge of disease or condition will improve Outcome: Progressing Goal: Knowledge of secondary prevention will improve (MUST DOCUMENT ALL) Outcome: Progressing Goal: Knowledge of patient specific risk factors will improve (Mark N/A or DELETE if not current risk factor) Outcome: Progressing   Problem: Ischemic Stroke/TIA Tissue Perfusion: Goal: Complications of ischemic stroke/TIA will be minimized Outcome: Progressing   Problem: Coping: Goal: Will verbalize positive feelings about self Outcome: Progressing Goal: Will identify appropriate support needs Outcome: Progressing   Problem: Health Behavior/Discharge Planning: Goal: Ability to manage health-related needs will improve Outcome: Progressing Goal: Goals will be collaboratively established with patient/family Outcome: Progressing   Problem: Self-Care: Goal: Ability to participate in self-care as condition permits will improve Outcome: Progressing Goal: Verbalization of feelings and concerns over difficulty with self-care will improve Outcome: Progressing Goal: Ability to communicate needs accurately will improve Outcome: Progressing   Problem: Nutrition: Goal: Risk of aspiration will decrease Outcome: Progressing Goal: Dietary intake will improve Outcome: Progressing   Problem: Education: Goal: Understanding of CV disease, CV risk reduction, and recovery process will improve Outcome: Progressing Goal: Individualized Educational Video(s) Outcome: Progressing   Problem: Activity: Goal: Ability to return to baseline activity level will improve Outcome: Progressing   Problem: Cardiovascular: Goal: Ability to achieve and maintain adequate cardiovascular perfusion will improve Outcome: Progressing Goal: Vascular access site(s) Level 0-1 will be maintained Outcome: Progressing   Problem: Health Behavior/Discharge Planning: Goal: Ability to safely manage health-related needs after  discharge will improve Outcome: Progressing   Problem: Education: Goal: Knowledge of General Education information will improve Description: Including pain rating scale, medication(s)/side effects and non-pharmacologic comfort measures Outcome: Progressing   Problem: Health Behavior/Discharge Planning: Goal: Ability to manage health-related needs will improve Outcome: Progressing   Problem: Clinical Measurements: Goal: Ability to maintain clinical measurements within normal limits will improve Outcome: Progressing Goal: Will remain free from infection Outcome: Progressing Goal: Diagnostic test results will improve Outcome: Progressing Goal: Respiratory complications will improve Outcome: Progressing Goal: Cardiovascular complication will be avoided Outcome: Progressing   Problem: Activity: Goal: Risk for activity intolerance will decrease Outcome: Progressing   Problem: Nutrition: Goal: Adequate nutrition will be maintained Outcome: Progressing   Problem: Coping: Goal: Level of anxiety will decrease Outcome: Progressing   Problem: Elimination: Goal: Will not experience complications related to bowel motility Outcome: Progressing Goal: Will not experience complications related to urinary retention Outcome: Progressing   Problem: Pain Managment: Goal: General experience of comfort will improve Outcome: Progressing   Problem: Safety: Goal: Ability to remain free from injury will improve Outcome: Progressing   Problem: Skin Integrity: Goal: Risk for impaired skin integrity will decrease Outcome: Progressing   

## 2023-04-25 NOTE — Plan of Care (Signed)
  Problem: Education: Goal: Knowledge of disease or condition will improve 04/25/2023 0011 by Dahlia Bailiff, RN Outcome: Progressing 04/25/2023 0011 by Dahlia Bailiff, RN Outcome: Progressing Goal: Knowledge of secondary prevention will improve (MUST DOCUMENT ALL) 04/25/2023 0011 by Dahlia Bailiff, RN Outcome: Progressing 04/25/2023 0011 by Dahlia Bailiff, RN Outcome: Progressing Goal: Knowledge of patient specific risk factors will improve Loraine Leriche N/A or DELETE if not current risk factor) 04/25/2023 0011 by Dahlia Bailiff, RN Outcome: Progressing 04/25/2023 0011 by Dahlia Bailiff, RN Outcome: Progressing
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
## 2023-04-26 ENCOUNTER — Inpatient Hospital Stay (HOSPITAL_COMMUNITY): Payer: Medicare PPO

## 2023-04-26 DIAGNOSIS — I639 Cerebral infarction, unspecified: Secondary | ICD-10-CM | POA: Diagnosis not present

## 2023-04-26 LAB — BASIC METABOLIC PANEL
Anion gap: 11 (ref 5–15)
BUN: 32 mg/dL — ABNORMAL HIGH (ref 8–23)
CO2: 23 mmol/L (ref 22–32)
Calcium: 8.7 mg/dL — ABNORMAL LOW (ref 8.9–10.3)
Chloride: 102 mmol/L (ref 98–111)
Creatinine, Ser: 1.53 mg/dL — ABNORMAL HIGH (ref 0.61–1.24)
GFR, Estimated: 49 mL/min — ABNORMAL LOW (ref >60)
Glucose, Bld: 149 mg/dL — ABNORMAL HIGH (ref 70–99)
Potassium: 3.8 mmol/L (ref 3.5–5.1)
Sodium: 136 mmol/L (ref 135–145)

## 2023-04-26 LAB — GLUCOSE, CAPILLARY
Glucose-Capillary: 133 mg/dL — ABNORMAL HIGH (ref 70–99)
Glucose-Capillary: 146 mg/dL — ABNORMAL HIGH (ref 70–99)
Glucose-Capillary: 148 mg/dL — ABNORMAL HIGH (ref 70–99)
Glucose-Capillary: 152 mg/dL — ABNORMAL HIGH (ref 70–99)
Glucose-Capillary: 153 mg/dL — ABNORMAL HIGH (ref 70–99)
Glucose-Capillary: 171 mg/dL — ABNORMAL HIGH (ref 70–99)

## 2023-04-26 LAB — CBC
HCT: 43 % (ref 39.0–52.0)
Hemoglobin: 14.1 g/dL (ref 13.0–17.0)
MCH: 27.5 pg (ref 26.0–34.0)
MCHC: 32.8 g/dL (ref 30.0–36.0)
MCV: 83.8 fL (ref 80.0–100.0)
Platelets: 121 10*3/uL — ABNORMAL LOW (ref 150–400)
RBC: 5.13 MIL/uL (ref 4.22–5.81)
RDW: 13.6 % (ref 11.5–15.5)
WBC: 4.9 10*3/uL (ref 4.0–10.5)
nRBC: 0 % (ref 0.0–0.2)

## 2023-04-26 LAB — HEPARIN LEVEL (UNFRACTIONATED): Heparin Unfractionated: <0.1 [IU]/mL — ABNORMAL LOW (ref 0.30–0.70)

## 2023-04-26 MED ORDER — HEPARIN (PORCINE) 25000 UT/250ML-% IV SOLN
1400.0000 [IU]/h | INTRAVENOUS | Status: DC
  Administered 2023-04-26: 950 [IU]/h via INTRAVENOUS
  Administered 2023-04-27: 1350 [IU]/h via INTRAVENOUS
  Administered 2023-04-28: 1550 [IU]/h via INTRAVENOUS
  Filled 2023-04-26 (×3): qty 250

## 2023-04-26 NOTE — Progress Notes (Signed)
Physical Therapy Treatment Patient Details Name: Timothy [name] MRN: [id number] DOB: Apr 13, 1956 Today's Date: 04/26/2023   History of Present Illness Patient is 67 y.o. 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 Pt s/p revascularization of the of the superior branch of the right MCA with mechanical thrombectomy achieving TICI 2C revascularization. PMH: ETOH abuse, CHF, CKD, HTN.    PT Comments  Remarkable improvement in strength and functional ability since last PT visit. Pt required min assist for transfer and gait up to 55 feet with a walker today. Drifting into obstacles unless physically corrected but able to correct when specifically cued to scan for object that is in the way (about 25% of the time.) No overt buckling of LEs. RLE only slightly weaker than LLE with MMT, however having difficulty executing commands for plantarflexion and abduction, but functionally seemed strong during gait training. Able to repeat but not recall orientation answers, even after re-questioning shortly after discussing. Pt was able to elevated RUE to level of RW, but cannot open/close hand on the hand grip. Patient will continue to benefit from skilled physical therapy services to further improve independence with functional mobility. Patient will benefit from intensive inpatient follow up therapy, >3 hours/day.    If plan is discharge home, recommend the following: Assistance with cooking/housework;Direct supervision/assist for medications management;Assist for transportation;Help with stairs or ramp for entrance;Supervision due to cognitive status;A little help with walking and/or transfers;A little help with bathing/dressing/bathroom   Can travel by private vehicle        Equipment Recommendations  Other (comment) (defer to next venue)    Recommendations for Other Services Rehab consult     Precautions / Restrictions Precautions Precautions:  Fall Precaution Comments: R neglect; SBP <180 Restrictions Weight Bearing Restrictions: No     Mobility  Bed Mobility Overal bed mobility: Needs Assistance Bed Mobility: Supine to Sit     Supine to sit: HOB elevated, Contact guard, Used rails     General bed mobility comments: CGA for safety, effortful to rise but performed without physical help of therapist, using rail as needed. Able to scoot with CGA along side of bed.    Transfers Overall transfer level: Needs assistance Equipment used: Rolling walker (2 wheels) Transfers: Sit to/from Stand, Bed to chair/wheelchair/BSC Sit to Stand: Min assist   Step pivot transfers: Min assist       General transfer comment: Min assist for boost and RUE support onto RW (lacks adequate grip strength however was able to raise RUE to level of RW today.) Cues for technique, demonstrates Rt lean. Min assist for RW control and to sustain RUE grip on RW with pivot steps to recliner.    Ambulation/Gait Ambulation/Gait assistance: Min assist, +2 safety/equipment Gait Distance (Feet): 55 Feet Assistive device: Rolling walker (2 wheels) Gait Pattern/deviations: Step-through pattern, Decreased stride length, Trunk flexed, Drifts right/left, Shuffle Gait velocity: decr Gait velocity interpretation: <1.31 ft/sec, indicative of household ambulator   General Gait Details: Educated on safe AD use with RW, required hand over hand support on Rt to sustain grip to RW (will likely benefit from trough.) VC for sequencing initially. Min assist for RW control, +2 for safety follow and equipment managment. Poor scanning and drifts into items unless physically corrected at times. Pt does respond to VC when specifically cued where to look to identify obstacle, then is able to manipulate RW to clear path (25% of the time.) No overt buckling during  bout. Attempted to have pt scan for targets and was able to fix gaze on Lt and Rt with effort but cannot express what  signs say.   Stairs             Wheelchair Mobility     Tilt Bed    Modified Rankin (Stroke Patients Only) Modified Rankin (Stroke Patients Only) Pre-Morbid Rankin Score: No significant disability Modified Rankin: Moderately severe disability     Balance Overall balance assessment: Needs assistance Sitting-balance support: No upper extremity supported, Feet supported Sitting balance-Leahy Scale: Fair Sitting balance - Comments: Rt lean able to correct with VC Postural control: Right lateral lean Standing balance support: During functional activity, Single extremity supported Standing balance-Leahy Scale: Poor Standing balance comment: Standing with LUE on RW. Rightward lean - reports he feels as if he is falling towards left.                            Cognition Arousal: Alert Behavior During Therapy: Flat affect Overall Cognitive Status: Difficult to assess Area of Impairment: Safety/judgement, Problem solving, Following commands, Orientation                 Orientation Level: Disoriented to, Place, Time, Situation     Following Commands: Follows one step commands consistently, Follows one step commands with increased time Safety/Judgement: Decreased awareness of deficits, Decreased awareness of safety   Problem Solving: Slow processing, Decreased initiation, Difficulty sequencing, Requires verbal cues, Requires tactile cues General Comments: Following single step commands consistently unless task involves Rt visual field. Able to repeat back orientation questions immediately, but forgets quickly when reassessed does not recall, thus I doubt it is purely an expressive issue.        Exercises General Exercises - Lower Extremity Ankle Circles/Pumps: AROM, Both, 10 reps, Seated Straight Leg Raises: Strengthening, Both, 15 reps, Supine    General Comments        Pertinent Vitals/Pain Pain Assessment Pain Assessment: No/denies pain     Home Living                          Prior Function            PT Goals (current goals can now be found in the care plan section) Acute Rehab PT Goals Patient Stated Goal: recover and get home PT Goal Formulation: With family Time For Goal Achievement: 05/06/23 Potential to Achieve Goals: Good Progress towards PT goals: Progressing toward goals    Frequency    Min 1X/week      PT Plan      Co-evaluation              AM-PAC PT "6 Clicks" Mobility   Outcome Measure  Help needed turning from your back to your side while in a flat bed without using bedrails?: A Little Help needed moving from lying on your back to sitting on the side of a flat bed without using bedrails?: A Little Help needed moving to and from a bed to a chair (including a wheelchair)?: A Little Help needed standing up from a chair using your arms (e.g., wheelchair or bedside chair)?: A Little Help needed to walk in hospital room?: A Little Help needed climbing 3-5 steps with a railing? : Total 6 Click Score: 16    End of Session Equipment Utilized During Treatment: Gait belt Activity Tolerance: Patient tolerated treatment well Patient left: in  chair;with call bell/phone within reach;with chair alarm set;with nursing/sitter in room;with SCD's reapplied Nurse Communication: Mobility status PT Visit Diagnosis: Unsteadiness on feet (R26.81);Other abnormalities of gait and mobility (R26.89);Muscle weakness (generalized) (M62.81);Difficulty in walking, not elsewhere classified (R26.2);Other symptoms and signs involving the nervous system (R29.898);Hemiplegia and hemiparesis Hemiplegia - Right/Left: Right Hemiplegia - dominant/non-dominant: Dominant Hemiplegia - caused by: Other cerebrovascular disease     Time: 1347-1411 PT Time Calculation (min) (ACUTE ONLY): 24 min  Charges:    $Gait Training: 8-22 mins $Therapeutic Activity: 8-22 mins PT General Charges $$ ACUTE PT VISIT: 1  Visit                     Kathlyn [place], PT, DPT [facility]  Rehabilitation Services Physical Therapist Office: [phone] Website: [email or web address]    Berton Mount 04/26/2023, 2:29 PM

## 2023-04-26 NOTE — Progress Notes (Signed)
Inpatient Rehab Admissions Coordinator:   Opened case for insurance prior auth request.  Will follow for determination.   Estill Dooms, PT, DPT Admissions Coordinator [phone] 04/26/23  3:03 PM

## 2023-04-26 NOTE — Progress Notes (Signed)
Speech Language Pathology Treatment: Dysphagia;Cognitive-Linquistic  Patient Details Name: Timothy [name] MRN: [id number] DOB: [birth year] Today's Date: 04/26/2023 Time: 4098-1191 SLP Time Calculation (min) (ACUTE ONLY): 20 min  Assessment / Plan / Recommendation Clinical Impression  Pt is showing some improvements in receptive and expressive language compared to initial evaluation. He is following one-step commands with more consistency, and answering simple yes/no questions more accurately. Verbal output is increased although still with social words and filler phrases. During confrontational naming task he is now able to name 2 items, increasing accuracy further with sentence completion and phonemic cues (and reducing distractions in environment). There was only one word that he was not able to generate, but repeated it after model from SLP. He counted 1-10 and sang the happy birthday song (attempted take me out to the ball game, for which he needed heavier cueing but could still say several words when sung in tandem with SLP). Pt continues to have intermittent coughing that could be concerning for decreased airway protection with ice and thin liquids. This seems to be reduced, but not eliminated when cued to take single sips. Anterior loss is noted but minimal with purees, and there is no overt coughing. Given overall improvements while still noting signs of dysphagia, recommend proceeding with MBS.     HPI HPI: Patient is 67 y.o. 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 Pt s/p revascularization of the of the superior branch of the right MCA with mechanical thrombectomy achieving TICI 2C revascularization. PMH: ETOH abuse, CHF, CKD, HTN.      SLP Plan  MBS      Recommendations for follow up therapy are one component of a multi-disciplinary discharge planning process, led by the attending physician.  Recommendations may be updated based  on patient status, additional functional criteria and insurance authorization.    Recommendations  Diet recommendations: NPO Medication Administration: Via alternative means                  Oral care QID   Frequent or constant Supervision/Assistance Aphasia (R47.01);Dysphagia, unspecified (R13.10)     MBS     Mahala Menghini., M.A. CCC-SLP Acute Rehabilitation Services Office [phone]  Secure chat preferred   04/26/2023, 10:48 AM

## 2023-04-26 NOTE — Care Management Important Message (Signed)
Important Message  Patient Details  Name: Timothy [name] MRN: [id number] Date of Birth: 1955/12/15   Important Message Given:  Yes - Medicare IM     Dorena Bodo 04/26/2023, 3:51 PM

## 2023-04-26 NOTE — TOC Progression Note (Signed)
Transition of Care [name]) - Progression Note    Patient Details  Name: Timothy [name] MRN: [id number] Date of Birth: 1955/09/20  Transition of [facility]) CM/SW Contact  Kermit Balo, RN Phone Number: 04/26/2023, 3:50 PM  Clinical Narrative:    CIR has started insurance auth for rehab stay.  TOC following.   Expected Discharge Plan: IP Rehab Facility    Expected Discharge Plan and Services                                               Social Determinants of Health (SDOH) Interventions SDOH Screenings   Tobacco Use: Medium Risk (04/21/2023)    Readmission Risk Interventions     No data to display

## 2023-04-26 NOTE — Progress Notes (Signed)
ANTICOAGULATION CONSULT NOTE   Pharmacy Consult for heparin Indication: atrial fibrillation and stroke  No Known Allergies  Patient Measurements: Height: 5\' 7"  (170.2 cm) Weight: 75.5 kg (166 lb 7.2 oz) IBW/kg (Calculated) : 66.1 Heparin Dosing Weight: 68kg  Vital Signs: Temp: 97.7 F (36.5 C) (09/23 1536) Temp Source: Oral (09/23 1536) BP: 134/84 (09/23 1536) Pulse Rate: 54 (09/23 1536)  Labs: Recent Labs    04/25/23 0709 04/26/23 0711 04/26/23 1853  HGB 13.8 14.1  --   HCT 41.3 43.0  --   PLT 115* 121*  --   HEPARINUNFRC  --   --  <0.10*  CREATININE 1.71* 1.53*  --     Estimated Creatinine Clearance: 42.6 mL/min (A) (by C-G formula based on SCr of 1.53 mg/dL (H)).   Medical History: Past Medical History:  Diagnosis Date   Alcohol abuse    Atrial fibrillation (HCC)    CHF (congestive heart failure) (HCC)    CKD (chronic kidney disease) stage 3, GFR 30-59 ml/min (HCC)    Hyperlipidemia    Hypertension    Noncompliance     Medications:  Medications Prior to Admission  Medication Sig Dispense Refill Last Dose   amLODipine (NORVASC) 10 MG tablet Take 1 tablet (10 mg total) by mouth daily for 30 days. 30 tablet 0 04/20/2023   carvedilol (COREG) 25 MG tablet Take 1 tablet (25 mg total) by mouth 2 (two) times daily with a meal for 30 days. 60 tablet 0 04/20/2023   cholecalciferol (VITAMIN D3) 25 MCG (1000 UNIT) tablet Take 1,000 Units by mouth daily.   04/20/2023   hydrALAZINE (APRESOLINE) 50 MG tablet Take 1 tablet (50 mg total) by mouth 3 (three) times daily. 270 tablet 3 04/20/2023   isosorbide mononitrate (IMDUR) 60 MG 24 hr tablet TAKE ONE TABLET BY MOUTH ONCE DAILY. 90 tablet 1 04/20/2023   pravastatin (PRAVACHOL) 20 MG tablet Take 20 mg by mouth daily.   04/20/2023   Scheduled:   aspirin  325 mg Per Tube Daily   feeding supplement (PROSource TF20)  60 mL Per Tube Daily   folic acid  1 mg Per Tube Daily   free water  200 mL Per Tube Q6H   isosorbide  mononitrate  60 mg Oral Daily   multivitamin with minerals  1 tablet Per Tube Daily   mouth rinse  15 mL Mouth Rinse 4 times per day   pravastatin  40 mg Oral Daily   thiamine  100 mg Per Tube Daily   Infusions:   feeding supplement (OSMOLITE 1.5 CAL) 1,000 mL (04/26/23 1741)   heparin 950 Units/hr (04/26/23 1111)    Assessment: Pt was admitted for new CVA. Hx of AF but not on anticoagulation. He got thrombectomy on 9/18. Hgb wnl, Plt 121k.  Heparin level undetectable. Confirmed with RN, no issues with heparin infusion, confirmed heparin running at 950 units/hr. No issues with bleeding noted.  Goal of Therapy:  Heparin level 0.3-0.5 units/ml Monitor platelets by anticoagulation protocol: Yes   Plan:  Increase heparin infusion at 1100 units/hr Check heparin level in 8 hours and daily while on heparin Continue to monitor H&H and platelets   Thank you for allowing pharmacy to be a part of this patient's care.  Thelma Barge, PharmD Clinical Pharmacist

## 2023-04-26 NOTE — Progress Notes (Signed)
ANTICOAGULATION CONSULT NOTE - Initial Consult  Pharmacy Consult for heparin Indication: atrial fibrillation and stroke  No Known Allergies  Patient Measurements: Height: 5\' 7"  (170.2 cm) Weight: 75.5 kg (166 lb 7.2 oz) IBW/kg (Calculated) : 66.1 Heparin Dosing Weight: 68kg  Vital Signs: Temp: 98.8 F (37.1 C) (09/23 0806) Temp Source: Oral (09/23 0806) BP: 133/68 (09/23 0806) Pulse Rate: 79 (09/23 0806)  Labs: Recent Labs    04/25/23 0709 04/26/23 0711  HGB 13.8 14.1  HCT 41.3 43.0  PLT 115* 121*  CREATININE 1.71* 1.53*    Estimated Creatinine Clearance: 42.6 mL/min (A) (by C-G formula based on SCr of 1.53 mg/dL (H)).   Medical History: Past Medical History:  Diagnosis Date   Alcohol abuse    Atrial fibrillation (HCC)    CHF (congestive heart failure) (HCC)    CKD (chronic kidney disease) stage 3, GFR 30-59 ml/min (HCC)    Hyperlipidemia    Hypertension    Noncompliance     Medications:  Medications Prior to Admission  Medication Sig Dispense Refill Last Dose   amLODipine (NORVASC) 10 MG tablet Take 1 tablet (10 mg total) by mouth daily for 30 days. 30 tablet 0 04/20/2023   carvedilol (COREG) 25 MG tablet Take 1 tablet (25 mg total) by mouth 2 (two) times daily with a meal for 30 days. 60 tablet 0 04/20/2023   cholecalciferol (VITAMIN D3) 25 MCG (1000 UNIT) tablet Take 1,000 Units by mouth daily.   04/20/2023   hydrALAZINE (APRESOLINE) 50 MG tablet Take 1 tablet (50 mg total) by mouth 3 (three) times daily. 270 tablet 3 04/20/2023   isosorbide mononitrate (IMDUR) 60 MG 24 hr tablet TAKE ONE TABLET BY MOUTH ONCE DAILY. 90 tablet 1 04/20/2023   pravastatin (PRAVACHOL) 20 MG tablet Take 20 mg by mouth daily.   04/20/2023   Scheduled:   aspirin  325 mg Per Tube Daily   feeding supplement (PROSource TF20)  60 mL Per Tube Daily   folic acid  1 mg Per Tube Daily   free water  200 mL Per Tube Q6H   isosorbide mononitrate  60 mg Oral Daily   multivitamin with minerals   1 tablet Per Tube Daily   mouth rinse  15 mL Mouth Rinse 4 times per day   pravastatin  40 mg Oral Daily   thiamine  100 mg Per Tube Daily   Infusions:   feeding supplement (OSMOLITE 1.5 CAL) 55 mL/hr at 04/25/23 0302   heparin      Assessment: Pt was admitted for new CVA. Hx of AF but not on anticoagulation. He got thrombectomy on 9/18.  Hgb wnl Plt 121k  Goal of Therapy:  Heparin level 0.3-0.5 units/ml Monitor platelets by anticoagulation protocol: Yes   Plan:  Heparin 950 units/hr Check 8 hr HL Daily HL and CBC F/u with oral AC  Ulyses Southward, PharmD, BCIDP, AAHIVP, CPP Infectious Disease Pharmacist 04/26/2023 10:50 AM

## 2023-04-26 NOTE — Evaluation (Signed)
Modified Barium Swallow Study  Patient Details  Name: Timothy [name] MRN: [id number] Date of Birth: 03-21-1956  Today's Date: 04/26/2023  Modified Barium Swallow completed.  Full report located under Chart Review in the Imaging Section.  History of Present Illness Patient is 67 y.o. 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 Pt s/p revascularization of the of the superior branch of the right MCA with mechanical thrombectomy achieving TICI 2C revascularization. PMH: ETOH abuse, CHF, CKD, HTN.   Clinical Impression Pt's oropharyngeal dysphagia includes timing, motor, and sensory components. He has reduced lingual control with disorganized posterior transit although minimal oral residue. There is anterior loss out of the R side of his mouth though. He has reduced hyolaryngeal movement and laryngeal vestibule closure, and when swallow is mistimed, he penetrates during the swallow across consistencies. Penetration also occurred occasionally from intermittent vallecular residue. Pt's volitional cough is weak and ineffective at clearing penetrates, which continue to fall over time until aspiration occurs. Aspiration for the most part is silent, although spontaneous coughing is also not effective. Pt has a hard time follow commands consistently enough to use strategies, but a chin tuck also did not improve airway protection. Pt is not yet ready for PO diet. Would continue Cortrak and consider therapeutic trials of boluses with SLP.  Factors that may increase risk of adverse event in presence of aspiration [name] & [name] 2021): Aspiration of thick, dense, and/or acidic materials;Weak cough;Presence of tubes (ETT, trach, NG, etc.);Dependence for feeding and/or oral hygiene;Limited mobility;Reduced saliva  Swallow Evaluation Recommendations Recommendations: NPO Medication Administration: Via alternative means Oral care recommendations: Oral care  QID (4x/day) Caregiver Recommendations: Have oral suction available      Mahala Menghini., M.A. CCC-SLP Acute Rehabilitation Services Office [phone]  Secure chat preferred  04/26/2023,1:06 PM
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
## 2023-04-27 DIAGNOSIS — I639 Cerebral infarction, unspecified: Secondary | ICD-10-CM | POA: Diagnosis not present

## 2023-04-27 LAB — CBC
HCT: 39.4 % (ref 39.0–52.0)
Hemoglobin: 12.9 g/dL — ABNORMAL LOW (ref 13.0–17.0)
MCH: 27.9 pg (ref 26.0–34.0)
MCHC: 32.7 g/dL (ref 30.0–36.0)
MCV: 85.1 fL (ref 80.0–100.0)
Platelets: 128 10*3/uL — ABNORMAL LOW (ref 150–400)
RBC: 4.63 MIL/uL (ref 4.22–5.81)
RDW: 13.4 % (ref 11.5–15.5)
WBC: 4.6 10*3/uL (ref 4.0–10.5)
nRBC: 0 % (ref 0.0–0.2)

## 2023-04-27 LAB — BASIC METABOLIC PANEL
Anion gap: 9 (ref 5–15)
BUN: 30 mg/dL — ABNORMAL HIGH (ref 8–23)
CO2: 26 mmol/L (ref 22–32)
Calcium: 8.6 mg/dL — ABNORMAL LOW (ref 8.9–10.3)
Chloride: 100 mmol/L (ref 98–111)
Creatinine, Ser: 1.46 mg/dL — ABNORMAL HIGH (ref 0.61–1.24)
GFR, Estimated: 52 mL/min — ABNORMAL LOW (ref >60)
Glucose, Bld: 176 mg/dL — ABNORMAL HIGH (ref 70–99)
Potassium: 4.2 mmol/L (ref 3.5–5.1)
Sodium: 135 mmol/L (ref 135–145)

## 2023-04-27 LAB — GLUCOSE, CAPILLARY
Glucose-Capillary: 176 mg/dL — ABNORMAL HIGH (ref 70–99)
Glucose-Capillary: 147 mg/dL — ABNORMAL HIGH (ref 70–99)
Glucose-Capillary: 165 mg/dL — ABNORMAL HIGH (ref 70–99)
Glucose-Capillary: 170 mg/dL — ABNORMAL HIGH (ref 70–99)
Glucose-Capillary: 121 mg/dL — ABNORMAL HIGH (ref 70–99)
Glucose-Capillary: 157 mg/dL — ABNORMAL HIGH (ref 70–99)

## 2023-04-27 LAB — HEPARIN LEVEL (UNFRACTIONATED)
Heparin Unfractionated: <0.1 IU/mL — ABNORMAL LOW (ref 0.30–0.70)
Heparin Unfractionated: <0.1 IU/mL — ABNORMAL LOW (ref 0.30–0.70)

## 2023-04-27 MED ORDER — PRAVASTATIN SODIUM 40 MG PO TABS
40.0000 mg | ORAL_TABLET | Freq: Every day | ORAL | Status: DC
  Administered 2023-04-27 – 2023-04-28 (×2): 40 mg
  Filled 2023-04-27 (×2): qty 1
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
## 2023-04-27 NOTE — Progress Notes (Signed)
Inpatient Rehab Admissions Coordinator:   Met with patient and spouse at bedside to update.  Insurance auth pending.  Will follow.   Estill Dooms, PT, DPT Admissions Coordinator [phone] 04/27/23  12:24 PM

## 2023-04-27 NOTE — Plan of Care (Signed)
  Problem: Safety: Goal: Ability to remain free from injury will improve Outcome: Progressing   

## 2023-04-27 NOTE — Progress Notes (Signed)
Occupational Therapy Treatment Patient Details Name: Timothy [name] MRN: [id number] DOB: 1956/07/14 Today's Date: 04/27/2023   History of present illness Patient is 67 y.o. 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 Pt s/p revascularization of the of the superior branch of the right MCA with mechanical thrombectomy achieving TICI 2C revascularization. PMH: ETOH abuse, CHF, CKD, HTN.   OT comments  Pt making steady progress towards OT goals. Focused on mobility to sink with RW and assist for navigating on R side. Pt able to demo initiation of compensatory strategies for ADLs standing at sink given R hand weakness. Provided squeeze sponge to work on grip strength but emphasized release of objects to avoid prolonged digit flexion. Continue to feel pt would progress well with intensive therapy services at DC.      If plan is discharge home, recommend the following:  A lot of help with bathing/dressing/bathroom;Assistance with cooking/housework;Assistance with feeding;Direct supervision/assist for medications management;Assist for transportation;Direct supervision/assist for financial management;Help with stairs or ramp for entrance;A little help with walking and/or transfers   Equipment Recommendations  BSC/3in1    Recommendations for Other Services Rehab consult    Precautions / Restrictions Precautions Precautions: Fall Precaution Comments: R inattention; SBP <180, cortrak Restrictions Weight Bearing Restrictions: No       Mobility Bed Mobility Overal bed mobility: Needs Assistance Bed Mobility: Supine to Sit, Sit to Supine     Supine to sit: HOB elevated, Contact guard, Used rails Sit to supine: Contact guard assist        Transfers Overall transfer level: Needs assistance Equipment used: Rolling walker (2 wheels) Transfers: Sit to/from Stand Sit to Stand: Min assist           General transfer comment: assist to  place R hand on RW and sustain grasp     Balance Overall balance assessment: Needs assistance Sitting-balance support: No upper extremity supported, Feet supported Sitting balance-Leahy Scale: Fair     Standing balance support: During functional activity, Single extremity supported, Bilateral upper extremity supported Standing balance-Leahy Scale: Poor                             ADL either performed or assessed with clinical judgement   ADL Overall ADL's : Needs assistance/impaired     Grooming: Minimal assistance;Standing;Wash/dry face Grooming Details (indicate cue type and reason): using R UE to attempt to wring out washcloth but using L hand to complete task to wash face. assist to place moisturizer in R hand with pt using L hand to open cap and place on lips standing at sink                             Functional mobility during ADLs: Minimal assistance;Rolling walker (2 wheels);Cueing for sequencing;Cueing for safety      Extremity/Trunk Assessment Upper Extremity Assessment Upper Extremity Assessment: Right hand dominant;RUE deficits/detail RUE Deficits / Details: difficulty moving on command, stronger proximally rather than distally. 5th digit prior deformity (trigger finger vs contracture- reports it has been like this a while). able to make light grasp, attempting to use this UE to hold items with cues for problem solving. RUE Sensation: decreased proprioception RUE Coordination: decreased fine motor;decreased gross motor   Lower Extremity Assessment Lower Extremity Assessment: Defer to PT evaluation        Vision  Vision Assessment?: Vision impaired- to be further tested in functional context Additional Comments: R inattention, able to gaze to R side when cued but difficulty sustaining attention   Perception     Praxis      Cognition Arousal: Alert Behavior During Therapy: WFL for tasks assessed/performed, Flat affect Overall  Cognitive Status: Difficult to assess Area of Impairment: Safety/judgement, Problem solving, Following commands, Orientation                 Orientation Level: Disoriented to, Time Current Attention Level: Sustained   Following Commands: Follows one step commands consistently, Follows one step commands with increased time Safety/Judgement: Decreased awareness of deficits, Decreased awareness of safety Awareness: Intellectual Problem Solving: Slow processing, Decreased initiation, Difficulty sequencing, Requires verbal cues, Requires tactile cues General Comments: consistent command following. able to answer being at hospital but increased time needed to respond to being here due to stroke. cues for R attention, laughing appropriately during session. difficulty reporting how long he has been married or which football type/teams he likes        Exercises Exercises: Other exercises Other Exercises Other Exercises: squeeze sponge (grasp/release); emphasis on release to avoid prolonged digit flexion    Shoulder Instructions       General Comments Wife at bedside but sleeping during session    Pertinent Vitals/ Pain       Pain Assessment Pain Assessment: No/denies pain  Home Living                                          Prior Functioning/Environment              Frequency  Min 1X/week        Progress Toward Goals  OT Goals(current goals can now be found in the care plan section)  Progress towards OT goals: Progressing toward goals  Acute Rehab OT Goals Patient Stated Goal: none stated OT Goal Formulation: With family Time For Goal Achievement: 05/06/23 Potential to Achieve Goals: Good ADL Goals Pt Will Perform Grooming: with supervision;with set-up;sitting Pt Will Perform Upper Body Bathing: with set-up;with supervision;sitting Pt Will Perform Lower Body Bathing: with contact guard assist;sit to/from stand Pt Will Transfer to Toilet:  with min assist;with +2 assist;ambulating Additional ADL Goal #1: Pt will use RUE as functional assist with mod multimodal cuing  Plan      Co-evaluation                 AM-PAC OT "6 Clicks" Daily Activity     Outcome Measure   Help from another person eating meals?: Total Help from another person taking care of personal grooming?: A Little Help from another person toileting, which includes using toliet, bedpan, or urinal?: Total Help from another person bathing (including washing, rinsing, drying)?: A Lot Help from another person to put on and taking off regular upper body clothing?: A Lot Help from another person to put on and taking off regular lower body clothing?: A Lot 6 Click Score: 11    End of Session Equipment Utilized During Treatment: Gait belt;Rolling walker (2 wheels)  OT Visit Diagnosis: Unsteadiness on feet (R26.81);Other abnormalities of gait and mobility (R26.89);Muscle weakness (generalized) (M62.81);Apraxia (R48.2);Other symptoms and signs involving the nervous system (R29.898);Other symptoms and signs involving cognitive function;Cognitive communication deficit (R41.841);Hemiplegia and hemiparesis Symptoms and signs involving cognitive functions: Cerebral infarction Hemiplegia - Right/Left: Right Hemiplegia -  dominant/non-dominant: Dominant Hemiplegia - caused by: Cerebral infarction   Activity Tolerance Patient tolerated treatment well   Patient Left in bed;with call bell/phone within reach;with bed alarm set;with family/visitor present   Nurse Communication Mobility status;Other (comment) (IV out of forearm)        Time: 1610-9604 OT Time Calculation (min): 28 min  Charges: OT General Charges $OT Visit: 1 Visit OT Treatments $Self Care/Home Management : 23-37 mins  Bradd Canary, OTR/L Acute Rehab Services Office: [phone]   Lorre Munroe 04/27/2023, 1:05 PM

## 2023-04-27 NOTE — Progress Notes (Signed)
ANTICOAGULATION CONSULT NOTE   Pharmacy Consult for heparin Indication: atrial fibrillation and stroke  No Known Allergies  Patient Measurements: Height: 5\' 7"  (170.2 cm) Weight: 75.5 kg (166 lb 7.2 oz) IBW/kg (Calculated) : 66.1 Heparin Dosing Weight: 68kg  Vital Signs: Temp: 98.7 F (37.1 C) (09/24 1506) Temp Source: Oral (09/24 1506) BP: 146/74 (09/24 1506) Pulse Rate: 48 (09/24 1506)  Labs: Recent Labs    04/25/23 0709 04/26/23 0711 04/26/23 1853 04/27/23 0547 04/27/23 1448  HGB 13.8 14.1  --  12.9*  --   HCT 41.3 43.0  --  39.4  --   PLT 115* 121*  --  128*  --   HEPARINUNFRC  --   --  <0.10* <0.10* <0.10*  CREATININE 1.71* 1.53*  --  1.46*  --     Estimated Creatinine Clearance: 44.6 mL/min (A) (by C-G formula based on SCr of 1.46 mg/dL (H)).   Medical History: Past Medical History:  Diagnosis Date   Alcohol abuse    Atrial fibrillation (HCC)    CHF (congestive heart failure) (HCC)    CKD (chronic kidney disease) stage 3, GFR 30-59 ml/min (HCC)    Hyperlipidemia    Hypertension    Noncompliance     Assessment: Pt was admitted for new CVA. Hx of AF but not on anticoagulation. He got thrombectomy on 9/18. Hgb wnl, Plt 128.  Heparin level remains undetectable on 1350 units/hr  Goal of Therapy:  Heparin level 0.3-0.5 units/ml Monitor platelets by anticoagulation protocol: Yes   Plan:  Increase heparin infusion to 1550 units/hr Heparin level and CBC in am  Harland German, PharmD Clinical Pharmacist **Pharmacist phone directory can now be found on [email or web address] ([email or web address]).  Listed under [facility].

## 2023-04-27 NOTE — Progress Notes (Signed)
Inpatient Rehab Admissions Coordinator:   Received approval from [facility] for CIR admission.  I do not have a bed available today but will follow for admit in the next 1-2 days.   Estill Dooms, PT, DPT Admissions Coordinator [phone] 04/27/23  4:05 PM

## 2023-04-27 NOTE — Progress Notes (Signed)
ANTICOAGULATION CONSULT NOTE   Pharmacy Consult for heparin Indication: atrial fibrillation and stroke  No Known Allergies  Patient Measurements: Height: 5\' 7"  (170.2 cm) Weight: 75.5 kg (166 lb 7.2 oz) IBW/kg (Calculated) : 66.1 Heparin Dosing Weight: 68kg  Vital Signs: Temp: 98 F (36.7 C) (09/24 0339) Temp Source: Oral (09/23 2345) BP: 123/81 (09/24 0339) Pulse Rate: 47 (09/24 0339)  Labs: Recent Labs    04/25/23 0709 04/26/23 0711 04/26/23 1853 04/27/23 0547  HGB 13.8 14.1  --  12.9*  HCT 41.3 43.0  --  39.4  PLT 115* 121*  --  128*  HEPARINUNFRC  --   --  <0.10* <0.10*  CREATININE 1.71* 1.53*  --   --     Estimated Creatinine Clearance: 42.6 mL/min (A) (by C-G formula based on SCr of 1.53 mg/dL (H)).   Medical History: Past Medical History:  Diagnosis Date   Alcohol abuse    Atrial fibrillation (HCC)    CHF (congestive heart failure) (HCC)    CKD (chronic kidney disease) stage 3, GFR 30-59 ml/min (HCC)    Hyperlipidemia    Hypertension    Noncompliance     Assessment: Pt was admitted for new CVA. Hx of AF but not on anticoagulation. He got thrombectomy on 9/18. Hgb wnl, Plt 128.  HL<0.10- undetectable, subtherapeutic   Goal of Therapy:  Heparin level 0.3-0.5 units/ml Monitor platelets by anticoagulation protocol: Yes   Plan:  Increase heparin infusion to 1350 units/hr Check heparin level in 8 hours and daily while on heparin Continue to monitor H&H and platelets   Thank you for allowing pharmacy to be a part of this patient's care.  Calton Dach, PharmD, BCCCP Clinical Pharmacist 04/27/2023 6:50 AM

## 2023-04-27 NOTE — Progress Notes (Signed)
Physical Therapy Treatment Patient Details Name: Timothy [name] MRN: [id number] DOB: 1956/01/08 Today's Date: 04/27/2023   History of Present Illness Patient is 67 y.o. 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 Pt s/p revascularization of the of the superior branch of the right MCA with mechanical thrombectomy achieving TICI 2C revascularization. PMH: ETOH abuse, CHF, CKD, HTN.    PT Comments  Pt seen for PT tx with pt eventually agreeable to participation after multiple attempts, encouragement, education. Pt impulsive with mobility, transferring to EOB prior to PT moving lines. Pt with R inattention to body, requiring cuing for placing RUE in front of him, maintaining grasp on RW. Pt is able to increase ambulation distances on this date, requiring min assist to ambulate with RW. Pt is making steady progress with mobility.   HR 47-50 bpm after gait, nurse made aware & reports MD is already aware   If plan is discharge home, recommend the following: Assistance with cooking/housework;Direct supervision/assist for medications management;Assist for transportation;Help with stairs or ramp for entrance;Supervision due to cognitive status;A little help with walking and/or transfers;A little help with bathing/dressing/bathroom   Can travel by private vehicle        Equipment Recommendations  None recommended by PT (defer to next venue)    Recommendations for Other Services Rehab consult     Precautions / Restrictions Precautions Precautions: Fall Precaution Comments: R inattention; SBP <180, cortrak Restrictions Weight Bearing Restrictions: No     Mobility  Bed Mobility Overal bed mobility: Needs Assistance Bed Mobility: Supine to Sit     Supine to sit: Used rails, Supervision, HOB elevated Sit to supine: Contact guard assist, Used rails, HOB elevated        Transfers Overall transfer level: Needs assistance Equipment used:  Rolling walker (2 wheels) Transfers: Sit to/from Stand Sit to Stand: Contact guard assist           General transfer comment: cuing for hand placement - RUE on RW, push to standing with LUE    Ambulation/Gait Ambulation/Gait assistance: Min assist Gait Distance (Feet): 200 Feet (pt walked to nurses station>back to end of hallway past room>room) Assistive device: Rolling walker (2 wheels) Gait Pattern/deviations: Decreased dorsiflexion - right, Decreased stride length Gait velocity: decreased     General Gait Details: Pt was able to ambulate increased distances on this date. Pt requires cuing to reposition & maintain RUE on RW vs sliding off to the side 2/2 decreased grasp. Pt requires min assist to turn RW at times.   Stairs             Wheelchair Mobility     Tilt Bed    Modified Rankin (Stroke Patients Only)       Balance Overall balance assessment: Needs assistance Sitting-balance support: No upper extremity supported, Feet supported Sitting balance-Leahy Scale: Fair     Standing balance support: During functional activity, Single extremity supported, Bilateral upper extremity supported Standing balance-Leahy Scale: Fair                              Cognition Arousal: Alert Behavior During Therapy: WFL for tasks assessed/performed, Flat affect Overall Cognitive Status: Difficult to assess Area of Impairment: Safety/judgement, Problem solving, Following commands, Orientation, Awareness, Attention                       Following Commands: Follows  one step commands with increased time, Follows one step commands inconsistently Safety/Judgement: Decreased awareness of deficits, Decreased awareness of safety Awareness: Intellectual Problem Solving: Slow processing, Decreased initiation, Difficulty sequencing, Requires verbal cues, Requires tactile cues          Exercises      General Comments General comments (skin integrity,  edema, etc.): Pt requires multiple attempts/encouragement to participate, eventually willing with max encouragement/education. Pt's wife asleep in bedside recliner throughout session.      Pertinent Vitals/Pain Pain Assessment Pain Assessment: No/denies pain    Home Living                          Prior Function            PT Goals (current goals can now be found in the care plan section) Acute Rehab PT Goals Patient Stated Goal: recover and get home PT Goal Formulation: With family Time For Goal Achievement: 05/06/23 Potential to Achieve Goals: Good Progress towards PT goals: Progressing toward goals    Frequency    Min 1X/week      PT Plan      Co-evaluation              AM-PAC PT "6 Clicks" Mobility   Outcome Measure  Help needed turning from your back to your side while in a flat bed without using bedrails?: A Little Help needed moving from lying on your back to sitting on the side of a flat bed without using bedrails?: A Little Help needed moving to and from a bed to a chair (including a wheelchair)?: A Little Help needed standing up from a chair using your arms (e.g., wheelchair or bedside chair)?: A Little Help needed to walk in hospital room?: A Little Help needed climbing 3-5 steps with a railing? : A Lot 6 Click Score: 17    End of Session   Activity Tolerance: Patient tolerated treatment well Patient left: in bed;with call bell/phone within reach;with bed alarm set;with family/visitor present   PT Visit Diagnosis: Unsteadiness on feet (R26.81);Other abnormalities of gait and mobility (R26.89);Muscle weakness (generalized) (M62.81);Difficulty in walking, not elsewhere classified (R26.2);Other symptoms and signs involving the nervous system (R29.898);Hemiplegia and hemiparesis Hemiplegia - Right/Left: Right Hemiplegia - dominant/non-dominant: Dominant Hemiplegia - caused by: Other cerebrovascular disease     Time: 2440-1027 PT Time  Calculation (min) (ACUTE ONLY): 18 min  Charges:    $Therapeutic Activity: 8-22 mins PT General Charges $$ ACUTE PT VISIT: 1 Visit                     Aleda Grana, PT, DPT 04/27/23, 2:24 PM   Sandi Mariscal 04/27/2023, 2:23 PM

## 2023-04-27 NOTE — Plan of Care (Signed)
Problem: Education: Goal: Knowledge of disease or condition will improve Outcome: Progressing Goal: Knowledge of secondary prevention will improve (MUST DOCUMENT ALL) Outcome: Progressing Goal: Knowledge of patient specific risk factors will improve Loraine Leriche N/A or DELETE if not current risk factor) Outcome: Progressing   Problem: Ischemic Stroke/TIA Tissue Perfusion: Goal: Complications of ischemic stroke/TIA will be minimized Outcome: Progressing   Problem: Coping: Goal: Will verbalize positive feelings about self Outcome: Progressing Goal: Will identify appropriate support needs Outcome: Progressing   Problem: Health Behavior/Discharge Planning: Goal: Ability to manage health-related needs will improve Outcome: Progressing Goal: Goals will be collaboratively established with patient/family Outcome: Progressing   Problem: Self-Care: Goal: Ability to participate in self-care as condition permits will improve Outcome: Progressing Goal: Verbalization of feelings and concerns over difficulty with self-care will improve Outcome: Progressing Goal: Ability to communicate needs accurately will improve Outcome: Progressing   Problem: Nutrition: Goal: Risk of aspiration will decrease Outcome: Progressing Goal: Dietary intake will improve Outcome: Progressing   Problem: Education: Goal: Understanding of CV disease, CV risk reduction, and recovery process will improve Outcome: Progressing Goal: Individualized Educational Video(s) Outcome: Progressing   Problem: Activity: Goal: Ability to return to baseline activity level will improve Outcome: Progressing   Problem: Cardiovascular: Goal: Ability to achieve and maintain adequate cardiovascular perfusion will improve Outcome: Progressing Goal: Vascular access site(s) Level 0-1 will be maintained Outcome: Progressing   Problem: Health Behavior/Discharge Planning: Goal: Ability to safely manage health-related needs after  discharge will improve Outcome: Progressing   Problem: Education: Goal: Knowledge of General Education information will improve Description: Including pain rating scale, medication(s)/side effects and non-pharmacologic comfort measures Outcome: Progressing   Problem: Health Behavior/Discharge Planning: Goal: Ability to manage health-related needs will improve Outcome: Progressing   Problem: Clinical Measurements: Goal: Ability to maintain clinical measurements within normal limits will improve Outcome: Progressing Goal: Will remain free from infection Outcome: Progressing Goal: Diagnostic test results will improve Outcome: Progressing Goal: Respiratory complications will improve Outcome: Progressing Goal: Cardiovascular complication will be avoided Outcome: Progressing   Problem: Activity: Goal: Risk for activity intolerance will decrease Outcome: Progressing   Problem: Nutrition: Goal: Adequate nutrition will be maintained Outcome: Progressing   Problem: Coping: Goal: Level of anxiety will decrease Outcome: Progressing   Problem: Elimination: Goal: Will not experience complications related to bowel motility Outcome: Progressing Goal: Will not experience complications related to urinary retention Outcome: Progressing   Problem: Pain Managment: Goal: General experience of comfort will improve Outcome: Progressing   Problem: Safety: Goal: Ability to remain free from injury will improve Outcome: Progressing   Problem: Skin Integrity: Goal: Risk for impaired skin integrity will decrease Outcome: Progressing

## 2023-04-28 ENCOUNTER — Inpatient Hospital Stay (HOSPITAL_COMMUNITY)
Admission: AD | Admit: 2023-04-28 | Discharge: 2023-05-19 | DRG: 056 | Disposition: A | Discharge status: Home Health | Payer: Medicare PPO | Source: Intra-hospital | Attending: Physical Medicine and Rehabilitation | Admitting: Physical Medicine and Rehabilitation

## 2023-04-28 ENCOUNTER — Other Ambulatory Visit: Payer: Self-pay

## 2023-04-28 ENCOUNTER — Encounter (HOSPITAL_COMMUNITY): Payer: Self-pay | Admitting: Physical Medicine and Rehabilitation

## 2023-04-28 DIAGNOSIS — I69392 Facial weakness following cerebral infarction: Secondary | ICD-10-CM | POA: Diagnosis not present

## 2023-04-28 DIAGNOSIS — R1312 Dysphagia, oropharyngeal phase: Secondary | ICD-10-CM | POA: Diagnosis present

## 2023-04-28 DIAGNOSIS — R001 Bradycardia, unspecified: Secondary | ICD-10-CM | POA: Diagnosis present

## 2023-04-28 DIAGNOSIS — Z8042 Family history of malignant neoplasm of prostate: Secondary | ICD-10-CM

## 2023-04-28 DIAGNOSIS — E1122 Type 2 diabetes mellitus with diabetic chronic kidney disease: Secondary | ICD-10-CM | POA: Diagnosis present

## 2023-04-28 DIAGNOSIS — I639 Cerebral infarction, unspecified: Secondary | ICD-10-CM | POA: Diagnosis not present

## 2023-04-28 DIAGNOSIS — I130 Hypertensive heart and chronic kidney disease with heart failure and stage 1 through stage 4 chronic kidney disease, or unspecified chronic kidney disease: Secondary | ICD-10-CM | POA: Diagnosis present

## 2023-04-28 DIAGNOSIS — E785 Hyperlipidemia, unspecified: Secondary | ICD-10-CM | POA: Diagnosis present

## 2023-04-28 DIAGNOSIS — R4701 Aphasia: Secondary | ICD-10-CM | POA: Diagnosis not present

## 2023-04-28 DIAGNOSIS — F09 Unspecified mental disorder due to known physiological condition: Secondary | ICD-10-CM

## 2023-04-28 DIAGNOSIS — Z7901 Long term (current) use of anticoagulants: Secondary | ICD-10-CM

## 2023-04-28 DIAGNOSIS — E43 Unspecified severe protein-calorie malnutrition: Secondary | ICD-10-CM | POA: Diagnosis present

## 2023-04-28 DIAGNOSIS — I5030 Unspecified diastolic (congestive) heart failure: Secondary | ICD-10-CM | POA: Diagnosis not present

## 2023-04-28 DIAGNOSIS — R1310 Dysphagia, unspecified: Secondary | ICD-10-CM | POA: Diagnosis present

## 2023-04-28 DIAGNOSIS — R944 Abnormal results of kidney function studies: Secondary | ICD-10-CM | POA: Diagnosis not present

## 2023-04-28 DIAGNOSIS — N1831 Chronic kidney disease, stage 3a: Secondary | ICD-10-CM | POA: Diagnosis present

## 2023-04-28 DIAGNOSIS — I69320 Aphasia following cerebral infarction: Secondary | ICD-10-CM | POA: Diagnosis present

## 2023-04-28 DIAGNOSIS — I63512 Cerebral infarction due to unspecified occlusion or stenosis of left middle cerebral artery: Principal | ICD-10-CM | POA: Diagnosis present

## 2023-04-28 DIAGNOSIS — I10 Essential (primary) hypertension: Secondary | ICD-10-CM

## 2023-04-28 DIAGNOSIS — Z7151 Drug abuse counseling and surveillance of drug abuser: Secondary | ICD-10-CM

## 2023-04-28 DIAGNOSIS — K5901 Slow transit constipation: Secondary | ICD-10-CM | POA: Diagnosis not present

## 2023-04-28 DIAGNOSIS — Z8249 Family history of ischemic heart disease and other diseases of the circulatory system: Secondary | ICD-10-CM

## 2023-04-28 DIAGNOSIS — F1090 Alcohol use, unspecified, uncomplicated: Secondary | ICD-10-CM | POA: Diagnosis present

## 2023-04-28 DIAGNOSIS — E875 Hyperkalemia: Secondary | ICD-10-CM | POA: Diagnosis present

## 2023-04-28 DIAGNOSIS — R159 Full incontinence of feces: Secondary | ICD-10-CM | POA: Diagnosis present

## 2023-04-28 DIAGNOSIS — I5032 Chronic diastolic (congestive) heart failure: Secondary | ICD-10-CM | POA: Diagnosis present

## 2023-04-28 DIAGNOSIS — I69391 Dysphagia following cerebral infarction: Secondary | ICD-10-CM

## 2023-04-28 DIAGNOSIS — D696 Thrombocytopenia, unspecified: Secondary | ICD-10-CM | POA: Diagnosis not present

## 2023-04-28 DIAGNOSIS — N183 Chronic kidney disease, stage 3 unspecified: Secondary | ICD-10-CM

## 2023-04-28 DIAGNOSIS — Z91199 Patient's noncompliance with other medical treatment and regimen due to unspecified reason: Secondary | ICD-10-CM

## 2023-04-28 DIAGNOSIS — Z833 Family history of diabetes mellitus: Secondary | ICD-10-CM | POA: Diagnosis not present

## 2023-04-28 DIAGNOSIS — K5900 Constipation, unspecified: Secondary | ICD-10-CM | POA: Diagnosis present

## 2023-04-28 DIAGNOSIS — Z79899 Other long term (current) drug therapy: Secondary | ICD-10-CM

## 2023-04-28 DIAGNOSIS — Z87891 Personal history of nicotine dependence: Secondary | ICD-10-CM

## 2023-04-28 DIAGNOSIS — I5022 Chronic systolic (congestive) heart failure: Secondary | ICD-10-CM | POA: Diagnosis present

## 2023-04-28 DIAGNOSIS — F1490 Cocaine use, unspecified, uncomplicated: Secondary | ICD-10-CM | POA: Diagnosis present

## 2023-04-28 DIAGNOSIS — I4891 Unspecified atrial fibrillation: Secondary | ICD-10-CM | POA: Diagnosis present

## 2023-04-28 DIAGNOSIS — M24541 Contracture, right hand: Secondary | ICD-10-CM | POA: Diagnosis present

## 2023-04-28 DIAGNOSIS — R32 Unspecified urinary incontinence: Secondary | ICD-10-CM | POA: Diagnosis present

## 2023-04-28 DIAGNOSIS — Z6824 Body mass index (BMI) 24.0-24.9, adult: Secondary | ICD-10-CM

## 2023-04-28 DIAGNOSIS — Z7982 Long term (current) use of aspirin: Secondary | ICD-10-CM

## 2023-04-28 DIAGNOSIS — Z8679 Personal history of other diseases of the circulatory system: Secondary | ICD-10-CM

## 2023-04-28 DIAGNOSIS — N179 Acute kidney failure, unspecified: Secondary | ICD-10-CM | POA: Diagnosis not present

## 2023-04-28 LAB — BASIC METABOLIC PANEL
Anion gap: 13 (ref 5–15)
BUN: 27 mg/dL — ABNORMAL HIGH (ref 8–23)
CO2: 28 mmol/L (ref 22–32)
Calcium: 9 mg/dL (ref 8.9–10.3)
Chloride: 98 mmol/L (ref 98–111)
Creatinine, Ser: 1.34 mg/dL — ABNORMAL HIGH (ref 0.61–1.24)
GFR, Estimated: 57 mL/min — ABNORMAL LOW (ref >60)
Glucose, Bld: 137 mg/dL — ABNORMAL HIGH (ref 70–99)
Potassium: 4.2 mmol/L (ref 3.5–5.1)
Sodium: 139 mmol/L (ref 135–145)

## 2023-04-28 LAB — CBC
HCT: 38.4 % — ABNORMAL LOW (ref 39.0–52.0)
Hemoglobin: 12.2 g/dL — ABNORMAL LOW (ref 13.0–17.0)
MCH: 26.8 pg (ref 26.0–34.0)
MCHC: 31.8 g/dL (ref 30.0–36.0)
MCV: 84.2 fL (ref 80.0–100.0)
Platelets: 121 10*3/uL — ABNORMAL LOW (ref 150–400)
RBC: 4.56 MIL/uL (ref 4.22–5.81)
RDW: 13.6 % (ref 11.5–15.5)
WBC: 4.4 10*3/uL (ref 4.0–10.5)
nRBC: 0 % (ref 0.0–0.2)

## 2023-04-28 LAB — GLUCOSE, CAPILLARY
Glucose-Capillary: 152 mg/dL — ABNORMAL HIGH (ref 70–99)
Glucose-Capillary: 135 mg/dL — ABNORMAL HIGH (ref 70–99)
Glucose-Capillary: 157 mg/dL — ABNORMAL HIGH (ref 70–99)
Glucose-Capillary: 126 mg/dL — ABNORMAL HIGH (ref 70–99)

## 2023-04-28 LAB — HEPARIN LEVEL (UNFRACTIONATED)
Heparin Unfractionated: 0.96 IU/mL — ABNORMAL HIGH (ref 0.30–0.70)
Heparin Unfractionated: 0.85 IU/mL — ABNORMAL HIGH (ref 0.30–0.70)

## 2023-04-28 MED ORDER — ACETAMINOPHEN 650 MG RE SUPP: 650.0000 mg | RECTAL | Status: DC | PRN

## 2023-04-28 MED ORDER — ORAL CARE MOUTH RINSE
15.0000 mL | OROMUCOSAL | Status: DC
  Administered 2023-04-28 – 2023-05-19 (×69): 15 mL via OROMUCOSAL

## 2023-04-28 MED ORDER — PRAVASTATIN SODIUM 40 MG PO TABS
40.0000 mg | ORAL_TABLET | Freq: Every day | ORAL | Status: DC
  Administered 2023-04-29 – 2023-05-04 (×6): 40 mg
  Filled 2023-04-28 (×7): qty 1

## 2023-04-28 MED ORDER — HEPARIN (PORCINE) 25000 UT/250ML-% IV SOLN
1150.0000 [IU]/h | INTRAVENOUS | Status: DC
  Administered 2023-04-28 – 2023-05-01 (×4): 1200 [IU]/h via INTRAVENOUS
  Administered 2023-05-02: 1100 [IU]/h via INTRAVENOUS
  Administered 2023-05-03: 1150 [IU]/h via INTRAVENOUS
  Administered 2023-05-04: 1200 [IU]/h via INTRAVENOUS
  Filled 2023-04-28 (×9): qty 250

## 2023-04-28 MED ORDER — THIAMINE MONONITRATE 100 MG PO TABS
100.0000 mg | ORAL_TABLET | Freq: Every day | ORAL | Status: DC
  Administered 2023-04-29 – 2023-05-04 (×6): 100 mg
  Filled 2023-04-28 (×7): qty 1

## 2023-04-28 MED ORDER — ADULT MULTIVITAMIN W/MINERALS CH
1.0000 | ORAL_TABLET | Freq: Every day | ORAL | Status: DC
  Administered 2023-04-29 – 2023-05-04 (×6): 1
  Filled 2023-04-28 (×7): qty 1

## 2023-04-28 MED ORDER — PROSOURCE TF20 ENFIT COMPATIBL EN LIQD
60.0000 mL | Freq: Every day | ENTERAL | Status: DC
  Administered 2023-04-29 – 2023-05-16 (×18): 60 mL
  Filled 2023-04-28 (×19): qty 60

## 2023-04-28 MED ORDER — ACETAMINOPHEN 325 MG PO TABS: 650.0000 mg | ORAL_TABLET | ORAL | Status: DC | PRN

## 2023-04-28 MED ORDER — ISOSORBIDE MONONITRATE ER 30 MG PO TB24
60.0000 mg | ORAL_TABLET | Freq: Every day | ORAL | Status: DC
  Filled 2023-04-28 (×2): qty 2

## 2023-04-28 MED ORDER — FOLIC ACID 1 MG PO TABS
1.0000 mg | ORAL_TABLET | Freq: Every day | ORAL | Status: DC
  Administered 2023-04-29 – 2023-05-04 (×6): 1 mg
  Filled 2023-04-28 (×7): qty 1

## 2023-04-28 MED ORDER — ORAL CARE MOUTH RINSE: 15.0000 mL | OROMUCOSAL | Status: DC | PRN

## 2023-04-28 MED ORDER — FREE WATER
200.0000 mL | Freq: Four times a day (QID) | Status: DC
  Administered 2023-04-28 – 2023-04-29 (×4): 200 mL

## 2023-04-28 MED ORDER — OSMOLITE 1.5 CAL PO LIQD
1000.0000 mL | ORAL | Status: DC
  Administered 2023-04-28 – 2023-05-03 (×7): 1000 mL
  Filled 2023-04-28 (×2): qty 1000

## 2023-04-28 MED ORDER — ACETAMINOPHEN 160 MG/5ML PO SOLN: 650.0000 mg | ORAL | Status: DC | PRN

## 2023-04-28 MED ORDER — ASPIRIN 325 MG PO TABS
325.0000 mg | ORAL_TABLET | Freq: Every day | ORAL | Status: DC
  Administered 2023-04-29 – 2023-05-03 (×5): 325 mg
  Filled 2023-04-28 (×7): qty 1

## 2023-04-28 MED ORDER — HEPARIN (PORCINE) 25000 UT/250ML-% IV SOLN
1400.0000 [IU]/h | INTRAVENOUS | Status: AC
  Administered 2023-04-28: 1400 [IU]/h via INTRAVENOUS
  Filled 2023-04-28: qty 250
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
## 2023-04-28 NOTE — Progress Notes (Signed)
Patient arrived to unit with nurse and tech from 3W unit. Patient continues on heparin drip and tube feedings. No s/s of bleeding noted. Skin assessment done with charge nurse on duty. Wife at bedside. Nurse informed by pharmacy that heparin will be placed on hold and rate reduced. Informed night shift nurse that received patient. Cletis Media, RN

## 2023-04-28 NOTE — Progress Notes (Signed)
ANTICOAGULATION CONSULT NOTE   Pharmacy Consult for heparin Indication: atrial fibrillation and stroke  No Known Allergies  Patient Measurements: Height: 5\' 7"  (170.2 cm) Weight: 72 kg (158 lb 11.7 oz) IBW/kg (Calculated) : 66.1 Heparin Dosing Weight: 68 kg  Vital Signs: Temp: 98.4 F (36.9 C) (09/25 1603) Temp Source: Oral (09/25 1100) BP: 188/89 (09/25 1603) Pulse Rate: 53 (09/25 1603)  Labs: Recent Labs    04/26/23 0711 04/26/23 1853 04/27/23 0547 04/27/23 1448 04/28/23 0702 04/28/23 1717  HGB 14.1  --  12.9*  --  12.2*  --   HCT 43.0  --  39.4  --  38.4*  --   PLT 121*  --  128*  --  121*  --   HEPARINUNFRC  --    < > <0.10* <0.10* 0.96* 0.85*  CREATININE 1.53*  --  1.46*  --  1.34*  --    < > = values in this interval not displayed.    Estimated Creatinine Clearance: 48.6 mL/min (A) (by C-G formula based on SCr of 1.34 mg/dL (H)).   Medical History: Past Medical History:  Diagnosis Date   Alcohol abuse    Atrial fibrillation (HCC)    CHF (congestive heart failure) (HCC)    CKD (chronic kidney disease) stage 3, GFR 30-59 ml/min (HCC)    Hyperlipidemia    Hypertension    Noncompliance     Assessment: Pt was admitted for new CVA. Hx of AF but not on anticoagulation. He got thrombectomy on 9/18. Hgb wnl, Plt 128.  Heparin level remains elevated despite reduction in rate. No issue with drip or bleeding per RN.   Goal of Therapy:  Heparin level 0.3-0.5 units/ml Monitor platelets by anticoagulation protocol: Yes   Plan:  Hold heparin infusion for 1 hour Restart heparin infusion at 1200 units/hr Check heparin level in 8 hours and daily while on heparin Continue to monitor H&H and platelets  Thank you for allowing pharmacy to be a part of this patient's care.  Thelma Barge, PharmD Clinical Pharmacist

## 2023-04-28 NOTE — H&P (Signed)
Physical Medicine and Rehabilitation Admission H&P    Chief Complaint  Patient presents with   Aphasia  : HPI: Timothy [name] is a 67 year old right-handed male with history of atrial fibrillation on no anticoagulation, hypertension, diastolic congestive heart failure hyperlipidemia, CKD stage III, tobacco/alcohol use, medical noncompliance.  Per chart review patient lives with spouse.  Two-level home full bath on main level and bedroom upstairs.  Independent prior to admission and retired.  Family reports they can assist 24/7 after discharge.  Presented 04/21/2023 with acute onset facial droop and aphasia.  Initial cranial CT scan showed focal area of decreased gray-white matter differentiation within the posterior left frontal lobe, concerning for acute or subacute infarct.  No intracranial hemorrhage.  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  Calcified plaque in the intracranial ICAs resulting in mild to moderate stenosis bilaterally.  CT cerebral perfusion scan showed a 54 cc ischemic brain identified primarily in the left MCA distribution.  Admission chemistries unremarkable except creatinine 1.41, alcohol negative, urine drug screen positive cocaine, hemoglobin A1c 6.3.  Patient underwent IR thrombectomy 04/21/2023 without complications per Dr. Corliss Skains.  Echocardiogram ejection fraction of 60 to 65% no wall motion abnormalities.  Patient was transferred to the ICU after thrombectomy and extubated.  Patient remains n.p.o. with alternative means of nutritional support.  He has a cortrak in place.  He is frustrated by his n.p.o. status.  Patient is currently on full-strength aspirin as well as IV heparin plan to change to Eliquis 5 mg twice daily after SLP determines as he can tolerate p.o. meds.  Patient did have  bouts of bradycardia into the 40s and Coreg was held and he does currently remain on Imdur as prior to admission.  Therapy evaluations completed due to patient decreased functional mobility was admitted for a comprehensive rehab program  Review of Systems  Constitutional:  Positive for fever. Negative for malaise/fatigue.  HENT:  Negative for hearing loss.   Eyes:  Negative for blurred vision and double vision.  Respiratory:  Positive for cough. Negative for shortness of breath and wheezing.   Cardiovascular:  Negative for chest pain, palpitations and leg swelling.  Gastrointestinal:  Positive for constipation. Negative for abdominal pain, diarrhea, heartburn, nausea and vomiting.  Genitourinary:  Negative for dysuria, flank pain and hematuria.  Musculoskeletal:  Positive for myalgias. Negative for joint pain.  Skin:  Negative for rash.  Neurological:  Positive for speech change and focal weakness.  All other systems reviewed and are negative.  Past Medical History:  Diagnosis Date   Alcohol abuse    Atrial fibrillation (HCC)    CHF (congestive heart failure) (HCC)    CKD (chronic kidney disease) stage 3, GFR 30-59 ml/min (HCC)    Hyperlipidemia    Hypertension    Noncompliance    Past Surgical History:  Procedure Laterality Date   INGUINAL HERNIA REPAIR Right 07/17/2022   Procedure: HERNIA REPAIR INGUINAL INCARCERATED;  Surgeon: Lucretia Roers, MD;  Location: [facility];  Service: General;  Laterality: Right;   IR CT HEAD LTD  04/21/2023   IR PERCUTANEOUS ART THROMBECTOMY/INFUSION INTRACRANIAL INC DIAG ANGIO  04/21/2023   RADIOLOGY WITH ANESTHESIA N/A 04/21/2023   Procedure: IR WITH ANESTHESIA;  Surgeon: Radiologist, Medication, MD;  Location: [facility];  Service: Radiology;  Laterality: N/A;   XI  ROBOTIC ASSISTED INGUINAL HERNIA REPAIR WITH MESH Left 03/19/2023   Procedure: XI ROBOTIC ASSISTED INGUINAL HERNIA REPAIR WITH MESH;  Surgeon: Franky Macho, MD;  Location: [facility];  Service:  General;  Laterality: Left;   Family History  Problem Relation Age of Onset   Diabetes Mother    Hypertension Mother    Cancer Father        Prostate Cancer   Hypertension Father    Diabetes Sister    Diabetes Brother    Hypertension Brother    Hypertension Brother    Social History:  reports that he has quit smoking. His smoking use included cigars. He has never used smokeless tobacco. He reports current alcohol use of about 7.0 standard drinks of alcohol per week. He reports current drug use. Drug: Cocaine. Allergies: No Known Allergies Medications Prior to Admission  Medication Sig Dispense Refill   amLODipine (NORVASC) 10 MG tablet Take 1 tablet (10 mg total) by mouth daily for 30 days. 30 tablet 0   carvedilol (COREG) 25 MG tablet Take 1 tablet (25 mg total) by mouth 2 (two) times daily with a meal for 30 days. 60 tablet 0   cholecalciferol (VITAMIN D3) 25 MCG (1000 UNIT) tablet Take 1,000 Units by mouth daily.     hydrALAZINE (APRESOLINE) 50 MG tablet Take 1 tablet (50 mg total) by mouth 3 (three) times daily. 270 tablet 3   isosorbide mononitrate (IMDUR) 60 MG 24 hr tablet TAKE ONE TABLET BY MOUTH ONCE DAILY. 90 tablet 1   pravastatin (PRAVACHOL) 20 MG tablet Take 20 mg by mouth daily.        Home: Home Living Family/patient expects to be discharged to:: Private residence Living Arrangements:  (spouse , daughter and grandchildren) Available Help at Discharge: Family, Available 24 hours/day Type of Home: House Home Access: Stairs to enter Secretary/administrator of Steps: 1 Entrance Stairs-Rails: None Home Layout: Two level, Full bath on main level, Bed/bath upstairs Alternate Level Stairs-Number of Steps: 13 Alternate Level Stairs-Rails: Right, Left Bathroom Shower/Tub: Tub/shower unit, Buyer, retail: Yes Home Equipment: None (Wife uses a rollator; RW)  Lives With:  (wife, daughter, and grand children)   Functional  History: Prior Function Prior Level of Function : Independent/Modified Independent, Driving, Other (comment) (retired) ADLs Comments: independent  Functional Status:  Mobility: Bed Mobility Overal bed mobility: Needs Assistance Bed Mobility: Supine to Sit Supine to sit: Used rails, Supervision, HOB elevated Sit to supine: Contact guard assist, Used rails, HOB elevated General bed mobility comments: CGA for safety, effortful to rise but performed without physical help of therapist, using rail as needed. Able to scoot with CGA along side of bed. Transfers Overall transfer level: Needs assistance Equipment used: Rolling walker (2 wheels) Transfers: Sit to/from Stand Sit to Stand: Contact guard assist Bed to/from chair/wheelchair/BSC transfer type:: Step pivot Step pivot transfers: Min assist General transfer comment: cuing for hand placement - RUE on RW, push to standing with LUE Ambulation/Gait Ambulation/Gait assistance: Min assist Gait Distance (Feet): 200 Feet (pt walked to nurses station>back to end of hallway past room>room) Assistive device: Rolling walker (2 wheels) Gait Pattern/deviations: Decreased dorsiflexion - right, Decreased stride length General Gait Details: Pt was able to ambulate increased distances on this date. Pt requires cuing to reposition & maintain RUE on RW vs sliding off to the side 2/2 decreased grasp. Pt requires min assist to turn RW at times. Gait velocity: decreased Gait velocity interpretation: <1.31 ft/sec, indicative of household ambulator  ADL: ADL Overall ADL's : Needs assistance/impaired Eating/Feeding: NPO Grooming: Minimal assistance, Standing, Wash/dry face Grooming Details (indicate cue type and reason): using R UE to attempt to wring out washcloth but using L hand to complete task to wash face. assist to place moisturizer in R hand with pt using L hand to open cap and place on lips standing at sink Upper Body Bathing: Moderate  assistance Lower Body Bathing: Moderate assistance, Sit to/from stand Upper Body Dressing : Maximal assistance, Sitting Lower Body Dressing: Total assistance, +2 for physical assistance, +2 for safety/equipment, Sit to/from stand Lower Body Dressing Details (indicate cue type and reason): pt able to assist with donning B slippers Toilet Transfer: Minimal assistance, +2 for physical assistance, +2 for safety/equipment Toilet Transfer Details (indicate cue type and reason): simulated; urinal in room Toileting- Clothing Manipulation and Hygiene: Maximal assistance Functional mobility during ADLs: Minimal assistance, Rolling walker (2 wheels), Cueing for sequencing, Cueing for safety  Cognition: Cognition Overall Cognitive Status: Difficult to assess Arousal/Alertness: Awake/alert Orientation Level: Disoriented to place, Disoriented to time, Disoriented to situation Awareness: Impaired Awareness Impairment: Emergent impairment Comments: inattentive to R side Cognition Arousal: Alert Behavior During Therapy: WFL for tasks assessed/performed, Flat affect Overall Cognitive Status: Difficult to assess Area of Impairment: Safety/judgement, Problem solving, Following commands, Orientation, Awareness, Attention Orientation Level: Disoriented to, Time Current Attention Level: Sustained Following Commands: Follows one step commands with increased time, Follows one step commands inconsistently Safety/Judgement: Decreased awareness of deficits, Decreased awareness of safety Awareness: Intellectual Problem Solving: Slow processing, Decreased initiation, Difficulty sequencing, Requires verbal cues, Requires tactile cues General Comments: consistent command following. able to answer being at hospital but increased time needed to respond to being here due to stroke. cues for R attention, laughing appropriately during session. difficulty reporting how long he has been married or which football type/teams he  likes Difficult to assess due to: Impaired communication  Physical Exam: Blood pressure 132/77, pulse (!) 56, temperature 98.2 F (36.8 C), temperature source Oral, resp. rate 16, height 5\' 7"  (1.702 m), weight 76.2 kg, SpO2 100%.  General:  No apparent distress, sitting in chair HEENT: Head is normocephalic, atraumatic, MMM, coretrak in place Neck: Supple without JVD or lymphadenopathy Heart: Mild bradycardia Chest: CTA bilaterally, no increased work of breathing.  On room air Abdomen: Soft, non-tender, non-distended, bowel sounds positive. Extremities: No clubbing, cyanosis, or edema.  Psych: Appropriate, pleasant Skin: Clean and intact without signs of breakdown Neuro: Alert to person, not date, he was able to choose hospital and and stroke when given choices.  Aphasia expressive more than receptive.  Able to name 2 out of 3 items.  He was able to repeat part of the phrase I gave him.  Left gaze preference.  EOMI.  mild right-sided facial weakness.  Facial sensation intact bilaterally to light touch. RUE: 4-/5 Deltoid, 4-/5 Biceps, 4-/5 Triceps, 4-/5 Grip LUE: 5/5 Deltoid, 5/5 Biceps, 5/5 Triceps,  5/5 Grip RLE: HF 4/5, KE 4+/5, ADF 4+/5, APF 4+/5 LLE: HF 5/5, KE 5/5, ADF 5/5, APF 5/5 Incision intact light touch in all 4 extremities Finger-nose and intact on the left, altered on the right in proportion to his weakness Musculoskeletal:  Normal bulk, no hypertonia noted No joint swelling noted Right upper extremity fifth digit contracture-wife reports this is chronic     Results for orders placed or performed during the hospital encounter of 04/21/23 (from the past 48 hour(s))  Basic metabolic panel     Status: Abnormal   Collection Time:  04/26/23  7:11 AM  Result Value Ref Range   Sodium 136 135 - 145 mmol/L   Potassium 3.8 3.5 - 5.1 mmol/L   Chloride 102 98 - 111 mmol/L   CO2 23 22 - 32 mmol/L   Glucose, Bld 149 (H) 70 - 99 mg/dL    Comment: Glucose reference range applies  only to samples taken after fasting for at least 8 hours.   BUN 32 (H) 8 - 23 mg/dL   Creatinine, Ser 1.61 (H) 0.61 - 1.24 mg/dL   Calcium 8.7 (L) 8.9 - 10.3 mg/dL   GFR, Estimated 49 (L) >60 mL/min    Comment: (NOTE) Calculated using the CKD-EPI Creatinine Equation (2021)    Anion gap 11 5 - 15    Comment: Performed at [facility], [address], [place], [place] [address]  CBC     Status: Abnormal   Collection Time: 04/26/23  7:11 AM  Result Value Ref Range   WBC 4.9 4.0 - 10.5 K/uL   RBC 5.13 4.22 - 5.81 MIL/uL   Hemoglobin 14.1 13.0 - 17.0 g/dL   HCT 54.0 98.1 - 19.1 %   MCV 83.8 80.0 - 100.0 fL   MCH 27.5 26.0 - 34.0 pg   MCHC 32.8 30.0 - 36.0 g/dL   RDW 47.8 29.5 - 62.1 %   Platelets 121 (L) 150 - 400 K/uL    Comment: REPEATED TO VERIFY   nRBC 0.0 0.0 - 0.2 %    Comment: Performed at [facility], [address], [place], [place] [address]  Glucose, capillary     Status: Abnormal   Collection Time: 04/26/23  8:08 AM  Result Value Ref Range   Glucose-Capillary 153 (H) 70 - 99 mg/dL    Comment: Glucose reference range applies only to samples taken after fasting for at least 8 hours.  Glucose, capillary     Status: Abnormal   Collection Time: 04/26/23 11:28 AM  Result Value Ref Range   Glucose-Capillary 171 (H) 70 - 99 mg/dL    Comment: Glucose reference range applies only to samples taken after fasting for at least 8 hours.  Glucose, capillary     Status: Abnormal   Collection Time: 04/26/23  3:55 PM  Result Value Ref Range   Glucose-Capillary 146 (H) 70 - 99 mg/dL    Comment: Glucose reference range applies only to samples taken after fasting for at least 8 hours.  Heparin level (unfractionated)     Status: Abnormal   Collection Time: 04/26/23  6:53 PM  Result Value Ref Range   Heparin Unfractionated <0.10 (L) 0.30 - 0.70 IU/mL    Comment: (NOTE) The clinical reportable range upper limit is being lowered to >1.10 to align with the FDA approved guidance  for the current laboratory assay.  If heparin results are below expected values, and patient dosage has  been confirmed, suggest follow up testing of antithrombin III levels. Performed at [facility], [address], [place], [place] [address]   Glucose, capillary     Status: Abnormal   Collection Time: 04/26/23  8:02 PM  Result Value Ref Range   Glucose-Capillary 152 (H) 70 - 99 mg/dL    Comment: Glucose reference range applies only to samples taken after fasting for at least 8 hours.  Glucose, capillary     Status: Abnormal   Collection Time: 04/26/23 11:44 PM  Result Value Ref Range   Glucose-Capillary 148 (H) 70 - 99 mg/dL  Comment: Glucose reference range applies only to samples taken after fasting for at least 8 hours.  Glucose, capillary     Status: Abnormal   Collection Time: 04/27/23  3:38 AM  Result Value Ref Range   Glucose-Capillary 176 (H) 70 - 99 mg/dL    Comment: Glucose reference range applies only to samples taken after fasting for at least 8 hours.  CBC     Status: Abnormal   Collection Time: 04/27/23  5:47 AM  Result Value Ref Range   WBC 4.6 4.0 - 10.5 K/uL   RBC 4.63 4.22 - 5.81 MIL/uL   Hemoglobin 12.9 (L) 13.0 - 17.0 g/dL   HCT 95.2 84.1 - 32.4 %   MCV 85.1 80.0 - 100.0 fL   MCH 27.9 26.0 - 34.0 pg   MCHC 32.7 30.0 - 36.0 g/dL   RDW 40.1 02.7 - 25.3 %   Platelets 128 (L) 150 - 400 K/uL   nRBC 0.0 0.0 - 0.2 %    Comment: Performed at [facility], [address], [place], [place] [address]  Heparin level (unfractionated)     Status: Abnormal   Collection Time: 04/27/23  5:47 AM  Result Value Ref Range   Heparin Unfractionated <0.10 (L) 0.30 - 0.70 IU/mL    Comment: (NOTE) The clinical reportable range upper limit is being lowered to >1.10 to align with the FDA approved guidance for the current laboratory assay.  If heparin results are below expected values, and patient dosage has  been confirmed, suggest follow up testing of  antithrombin III levels. Performed at [facility], [address], [place], [place] [address]   Basic metabolic panel     Status: Abnormal   Collection Time: 04/27/23  5:47 AM  Result Value Ref Range   Sodium 135 135 - 145 mmol/L   Potassium 4.2 3.5 - 5.1 mmol/L   Chloride 100 98 - 111 mmol/L   CO2 26 22 - 32 mmol/L   Glucose, Bld 176 (H) 70 - 99 mg/dL    Comment: Glucose reference range applies only to samples taken after fasting for at least 8 hours.   BUN 30 (H) 8 - 23 mg/dL   Creatinine, Ser 5.95 (H) 0.61 - 1.24 mg/dL   Calcium 8.6 (L) 8.9 - 10.3 mg/dL   GFR, Estimated 52 (L) >60 mL/min    Comment: (NOTE) Calculated using the CKD-EPI Creatinine Equation (2021)    Anion gap 9 5 - 15    Comment: Performed at [facility], [address], [place], [place] [address]  Glucose, capillary     Status: Abnormal   Collection Time: 04/27/23  7:42 AM  Result Value Ref Range   Glucose-Capillary 147 (H) 70 - 99 mg/dL    Comment: Glucose reference range applies only to samples taken after fasting for at least 8 hours.  Glucose, capillary     Status: Abnormal   Collection Time: 04/27/23 11:29 AM  Result Value Ref Range   Glucose-Capillary 165 (H) 70 - 99 mg/dL    Comment: Glucose reference range applies only to samples taken after fasting for at least 8 hours.  Heparin level (unfractionated)     Status: Abnormal   Collection Time: 04/27/23  2:48 PM  Result Value Ref Range   Heparin Unfractionated <0.10 (L) 0.30 - 0.70 IU/mL    Comment: (NOTE) The clinical reportable range upper limit is being lowered to >1.10 to align with the FDA approved guidance for the  current laboratory assay.  If heparin results are below expected values, and patient dosage has  been confirmed, suggest follow up testing of antithrombin III levels. Performed at [facility], [address] [place], [place] [address]   Glucose, capillary     Status: Abnormal   Collection Time: 04/27/23  3:40  PM  Result Value Ref Range   Glucose-Capillary 170 (H) 70 - 99 mg/dL    Comment: Glucose reference range applies only to samples taken after fasting for at least 8 hours.  Glucose, capillary     Status: Abnormal   Collection Time: 04/27/23  7:36 PM  Result Value Ref Range   Glucose-Capillary 121 (H) 70 - 99 mg/dL    Comment: Glucose reference range applies only to samples taken after fasting for at least 8 hours.  Glucose, capillary     Status: Abnormal   Collection Time: 04/27/23 11:24 PM  Result Value Ref Range   Glucose-Capillary 157 (H) 70 - 99 mg/dL    Comment: Glucose reference range applies only to samples taken after fasting for at least 8 hours.  Glucose, capillary     Status: Abnormal   Collection Time: 04/28/23  4:01 AM  Result Value Ref Range   Glucose-Capillary 152 (H) 70 - 99 mg/dL    Comment: Glucose reference range applies only to samples taken after fasting for at least 8 hours.   DG Swallowing Func-Speech Pathology  Result Date: 04/26/2023 Table formatting from the original result was not included. Modified Barium Swallow Study Patient Details Name: Timothy [name] MRN: [id number] Date of Birth: Dec 16, 1955 Today's Date: 04/26/2023 HPI/PMH: HPI: Patient is 67 y.o. 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 Pt s/p revascularization of the of the superior branch of the right MCA with mechanical thrombectomy achieving TICI 2C revascularization. PMH: ETOH abuse, CHF, CKD, HTN. Clinical Impression: Clinical Impression: Pt's oropharyngeal dysphagia includes timing, motor, and sensory components. He has reduced lingual control with disorganized posterior transit although minimal oral residue. There is anterior loss out of the R side of his mouth though. He has reduced hyolaryngeal movement and laryngeal vestibule closure, and when swallow is mistimed, he penetrates during the swallow across consistencies. Penetration also occurred  occasionally from intermittent vallecular residue. Pt's volitional cough is weak and ineffective at clearing penetrates, which continue to fall over time until aspiration occurs. Aspiration for the most part is silent, although spontaneous coughing is also not effective. Pt has a hard time follow commands consistently enough to use strategies, but a chin tuck also did not improve airway protection. Pt is not yet ready for PO diet. Would continue Cortrak and consider therapeutic trials of boluses with SLP. Factors that may increase risk of adverse event in presence of aspiration [name] & [name] 2021): Factors that may increase risk of adverse event in presence of aspiration [name] & [name] 2021): Aspiration of thick, dense, and/or acidic materials; Weak cough; Presence of tubes (ETT, trach, NG, etc.); Dependence for feeding and/or oral hygiene; Limited mobility; Reduced saliva Recommendations/Plan: Swallowing Evaluation Recommendations Swallowing Evaluation Recommendations Recommendations: NPO Medication Administration: Via alternative means Oral care recommendations: Oral care QID (4x/day) Caregiver Recommendations: Have oral suction available Treatment Plan Treatment Plan Treatment recommendations: Therapy as outlined in treatment plan below Follow-up recommendations: Acute inpatient rehab (3 hours/day) Functional status assessment: Patient has had a recent decline in their functional status and demonstrates the ability to make significant improvements in function  in a reasonable and predictable amount of time. Treatment frequency: Min 2x/week Treatment duration: 2 weeks Interventions: Aspiration precaution training; Compensatory techniques; Patient/family education; Trials of upgraded texture/liquids; Diet toleration management by SLP Recommendations Recommendations for follow up therapy are one component of a multi-disciplinary discharge planning process, led by the attending physician.  Recommendations may  be updated based on patient status, additional functional criteria and insurance authorization. Assessment: Orofacial Exam: Orofacial Exam Oral Cavity: Oral Hygiene: WFL Oral Cavity - Dentition: Poor condition; Missing dentition Orofacial Anatomy: WFL Anatomy: Anatomy: WFL Boluses Administered: Boluses Administered Boluses Administered: Thin liquids (Level 0); Mildly thick liquids (Level 2, nectar thick); Moderately thick liquids (Level 3, honey thick); Puree  Oral Impairment Domain: Oral Impairment Domain Lip Closure: Escape beyond mid-chin Tongue control during bolus hold: Posterior escape of less than half of bolus Bolus preparation/mastication: -- (solid deferred) Bolus transport/lingual motion: Repetitive/disorganized tongue motion Oral residue: Trace residue lining oral structures Location of oral residue : Floor of mouth; Lateral sulci Initiation of pharyngeal swallow : Pyriform sinuses  Pharyngeal Impairment Domain: Pharyngeal Impairment Domain Soft palate elevation: No bolus between soft palate (SP)/pharyngeal wall (PW) Laryngeal elevation: Partial superior movement of thyroid cartilage/partial approximation of arytenoids to epiglottic petiole Anterior hyoid excursion: Partial anterior movement Epiglottic movement: Complete inversion Laryngeal vestibule closure: Incomplete, narrow column air/contrast in laryngeal vestibule Pharyngeal stripping wave : Present - complete Pharyngeal contraction (A/P view only): N/A Pharyngoesophageal segment opening: Complete distension and complete duration, no obstruction of flow Tongue base retraction: No contrast between tongue base and posterior pharyngeal wall (PPW) Pharyngeal residue: Collection of residue within or on pharyngeal structures Location of pharyngeal residue: Valleculae; Pyriform sinuses  Esophageal Impairment Domain: No data recorded Pill: No data recorded Penetration/Aspiration Scale Score: Penetration/Aspiration Scale Score 8.  Material enters airway,  passes BELOW cords without attempt by patient to eject out (silent aspiration) : Thin liquids (Level 0); Mildly thick liquids (Level 2, nectar thick); Moderately thick liquids (Level 3, honey thick); Puree Compensatory Strategies: Compensatory Strategies Compensatory strategies: Yes Chin tuck: Ineffective Ineffective Chin Tuck: Moderately thick liquid (Level 3, honey thick)   General Information: Caregiver present: No  Diet Prior to this Study: NPO   Temperature : Normal   Respiratory Status: WFL   Supplemental O2: None (Room air)   History of Recent Intubation: Yes  Behavior/Cognition: Alert; Cooperative; Requires cueing Self-Feeding Abilities: Able to self-feed Baseline vocal quality/speech: Normal Volitional Cough: Able to elicit Volitional Swallow: Able to elicit Exam Limitations: No limitations Goal Planning: Prognosis for improved oropharyngeal function: Good Barriers to Reach Goals: Language deficits No data recorded Patient/Family Stated Goal: none stated Consulted and agree with results and recommendations: Patient Pain: Pain Assessment Pain Assessment: Faces Faces Pain Scale: 0 End of Session: Start Time:SLP Start Time (ACUTE ONLY): 1148 Stop Time: SLP Stop Time (ACUTE ONLY): 1201 Time Calculation:SLP Time Calculation (min) (ACUTE ONLY): 13 min Charges: SLP Evaluations $ SLP Speech Visit: 1 Visit SLP Evaluations $MBS Swallow: 1 Procedure $Swallowing Treatment: 1 Procedure $Speech Treatment for Individual: 1 Procedure SLP visit diagnosis: SLP Visit Diagnosis: Dysphagia, oropharyngeal phase (R13.12) Past Medical History: Past Medical History: Diagnosis Date  Alcohol abuse   Atrial fibrillation (HCC)   CHF (congestive heart failure) (HCC)   CKD (chronic kidney disease) stage 3, GFR 30-59 ml/min (HCC)   Hyperlipidemia   Hypertension   Noncompliance  Past Surgical History: Past Surgical History: Procedure Laterality Date  INGUINAL HERNIA REPAIR Right 07/17/2022  Procedure: HERNIA REPAIR INGUINAL INCARCERATED;   Surgeon: Algis Greenhouse  C, MD;  Location: [facility];  Service: General;  Laterality: Right;  IR CT HEAD LTD  04/21/2023  IR PERCUTANEOUS ART THROMBECTOMY/INFUSION INTRACRANIAL INC DIAG ANGIO  04/21/2023  RADIOLOGY WITH ANESTHESIA N/A 04/21/2023  Procedure: IR WITH ANESTHESIA;  Surgeon: Radiologist, Medication, MD;  Location: [facility];  Service: Radiology;  Laterality: N/A;  XI ROBOTIC ASSISTED INGUINAL HERNIA REPAIR WITH MESH Left 03/19/2023  Procedure: XI ROBOTIC ASSISTED INGUINAL HERNIA REPAIR WITH MESH;  Surgeon: Franky Macho, MD;  Location: [facility];  Service: General;  Laterality: Left; Mahala Menghini., M.A. CCC-SLP Acute Rehabilitation Services Office [phone] Secure chat preferred 04/26/2023, 1:08 PM     Blood pressure 132/77, pulse (!) 56, temperature 98.2 F (36.8 C), temperature source Oral, resp. rate 16, height 5\' 7"  (1.702 m), weight 76.2 kg, SpO2 100%.  Medical Problem List and Plan: 1. 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  Etiology suspected to be A-fib not on anticoagulation versus large vessel disease  -patient may shower  -ELOS/Goals: 10 to 14 days, supervision to min assist with PT, OT, SLP  -Admit to CIR 2.  Antithrombotics: -DVT/anticoagulation:  Pharmaceutical: Heparin initiated for anticoagulation. PLAN TO TRANSITION TO ELIQUIS 5 MG BID ONCE ABLE TO SWALLOW.   -antiplatelet therapy: Aspirin 325 mg daily 3. Pain Management: Al-Anon as needed 4. Mood/Behavior/Sleep: Provide emotional support  -antipsychotic agents: N/A 5. Neuropsych/cognition: This patient is not capable of making decisions on his own behalf. 6. Skin/Wound Care: Routine skin checks 7. Fluids/Electrolytes/Nutrition: Routine and analysis with follow-up chemistries 8.  Dysphagia.  Currently NPO, Cortrak. Speech therapy follow-up. 9.  Hypertension.  Imdur 60 mg daily.  Monitor with increased mobility.  Long-term blood pressure goal normotensive 10.   Bradycardia into the 40s.  Coreg held.  Avoid negative chronotropic medications.  Monitor with increased mobility 11.  Hyperlipidemia.  Pravachol 12.  CKD stage III.  Follow-up chemistries 13.  Diastolic congestive heart failure.  Monitor for any signs of fluid overload.  14.  History of tobacco/alcohol use/Cocaine.  Provide counseling 15.  Medical noncompliance.  Counseling 16.  Atrial fibrillation.  Hold Coreg due to bradycardia.  Continue ASA and heparin until able to swallow and can switch to [employer], PA-C 04/28/2023  I have personally performed a face to face diagnostic evaluation of this patient and formulated the key components of the plan.  Additionally, I have personally reviewed laboratory data, imaging studies, as well as relevant notes and concur with the physician assistant's documentation above.  The patient's status has not changed from the original H&P.  Any changes in documentation from the acute care chart have been noted above.  Fanny Dance, MD, [place]

## 2023-04-28 NOTE — Plan of Care (Signed)
  Problem: Consults Goal: RH STROKE PATIENT EDUCATION Description: See Patient Education module for education specifics  Outcome: Progressing   Problem: RH BOWEL ELIMINATION Goal: RH STG MANAGE BOWEL WITH ASSISTANCE Description: STG Manage Bowel with min Assistance. Outcome: Progressing   Problem: RH BLADDER ELIMINATION Goal: RH STG MANAGE BLADDER WITH ASSISTANCE Description: STG Manage Bladder With min Assistance Outcome: Progressing   Problem: RH SKIN INTEGRITY Goal: RH STG SKIN FREE OF INFECTION/BREAKDOWN Description: Skin will remain intact and free of breakdown with min assist  Outcome: Progressing Goal: RH STG MAINTAIN SKIN INTEGRITY WITH ASSISTANCE Description: STG Maintain Skin Integrity With min Assistance. Outcome: Progressing Goal: RH STG ABLE TO PERFORM INCISION/WOUND CARE W/ASSISTANCE Description: STG Able To Perform Incision/Wound Care With min Assistance. Outcome: Progressing   Problem: RH SAFETY Goal: RH STG ADHERE TO SAFETY PRECAUTIONS W/ASSISTANCE/DEVICE Description: STG Adhere to Safety Precautions With min Assistance/Device. Outcome: Progressing Goal: RH STG DECREASED RISK OF FALL WITH ASSISTANCE Description: STG Decreased Risk of Fall With min Assistance. Outcome: Progressing   Problem: RH COGNITION-NURSING Goal: RH STG USES MEMORY AIDS/STRATEGIES W/ASSIST TO PROBLEM SOLVE Description: STG Uses Memory Aids/Strategies With min Assistance to Problem Solve. Outcome: Progressing   Problem: RH PAIN MANAGEMENT Goal: RH STG PAIN MANAGED AT OR BELOW PT'S PAIN GOAL Description: Less than 4 with PRN medications min assist  Outcome: Progressing   Problem: RH KNOWLEDGE DEFICIT Goal: RH STG INCREASE KNOWLEDGE OF HYPERTENSION Description: Patient/caregiver will be able to manage HTN medications and diet modifications to improve blood pressure from nursing education and nursing handouts independently  Outcome: Progressing Goal: RH STG INCREASE KNOWLEDGE OF  DYSPHAGIA/FLUID INTAKE Description: Patient/caregiver will be able to manage appropriate diet/textures to prevent aspiration from nursing education and nursing handouts independently   Outcome: Progressing Goal: RH STG INCREASE KNOWLEDGE OF STROKE PROPHYLAXIS Description: Patient/caregiver will be able to manage stroke medications from nursing education and nursing handouts independently  Outcome: Progressing

## 2023-04-28 NOTE — Progress Notes (Signed)
Inpatient Rehab Admissions Coordinator:   I have a bed for this patient to admit to CIR today.  Dr. Pearlean Brownie in agreement and [facility] aware.  Will let pt/family know.   Estill Dooms, PT, DPT Admissions Coordinator [phone] 04/28/23  10:20 AM

## 2023-04-28 NOTE — Progress Notes (Signed)
ANTICOAGULATION CONSULT NOTE   Pharmacy Consult for heparin Indication: atrial fibrillation and stroke  No Known Allergies  Patient Measurements: Height: 5\' 7"  (170.2 cm) Weight: 76.2 kg (167 lb 15.9 oz) IBW/kg (Calculated) : 66.1 Heparin Dosing Weight: 68kg  Vital Signs: Temp: 99.2 F (37.3 C) (09/25 0824) Temp Source: Oral (09/25 0824) BP: 159/72 (09/25 0824) Pulse Rate: 61 (09/25 0824)  Labs: Recent Labs    04/26/23 0711 04/26/23 1853 04/27/23 0547 04/27/23 1448 04/28/23 0702  HGB 14.1  --  12.9*  --  12.2*  HCT 43.0  --  39.4  --  38.4*  PLT 121*  --  128*  --  121*  HEPARINUNFRC  --    < > <0.10* <0.10* 0.96*  CREATININE 1.53*  --  1.46*  --  1.34*   < > = values in this interval not displayed.    Estimated Creatinine Clearance: 48.6 mL/min (A) (by C-G formula based on SCr of 1.34 mg/dL (H)).   Medical History: Past Medical History:  Diagnosis Date   Alcohol abuse    Atrial fibrillation (HCC)    CHF (congestive heart failure) (HCC)    CKD (chronic kidney disease) stage 3, GFR 30-59 ml/min (HCC)    Hyperlipidemia    Hypertension    Noncompliance     Assessment: Pt was admitted for new CVA. Hx of AF but not on anticoagulation. He got thrombectomy on 9/18. Hgb wnl, Plt 128.  Heparin level came back elevated this AM. No issue with drip or bleeding per Rn. We will adjust and check another level.   Goal of Therapy:  Heparin level 0.3-0.5 units/ml Monitor platelets by anticoagulation protocol: Yes   Plan:  Decrease heparin infusion to 1400units/hr 8 hr HL Heparin level and CBC daily PO apixaban when ok with swallow  Ulyses Southward, PharmD, BCIDP, AAHIVP, CPP Infectious Disease Pharmacist 04/28/2023 10:18 AM
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
## 2023-04-28 NOTE — TOC Transition Note (Signed)
Transition of Care [name]) - CM/SW Discharge Note   Patient Details  Name: DEDAN JACOBOWITZ MRN: [id number] Date of Birth: Nov 24, 1955  [facility] CM/SW Contact:  Kermit Balo, RN Phone Number: 04/28/2023, 10:06 AM   Clinical Narrative:     Pt is discharging to CIR today. CM signing off.  Final next level of care: IP Rehab Facility Barriers to Discharge: No Barriers Identified   Patient Goals and CMS Choice CMS Medicare.gov Compare Post Acute Care list provided to:: Patient Choice offered to / list presented to : Patient  Discharge Placement                         Discharge Plan and Services Additional resources added to the After Visit Summary for                                       Social Determinants of Health (SDOH) Interventions SDOH Screenings   Tobacco Use: Medium Risk (04/21/2023)     Readmission Risk Interventions     No data to display

## 2023-04-28 NOTE — H&P (Signed)
Physical Medicine and Rehabilitation Admission H&P        Chief Complaint  Patient presents with   Aphasia  : HPI: Timothy [name] is a 67 year old right-handed male with history of atrial fibrillation on no anticoagulation, hypertension, diastolic congestive heart failure hyperlipidemia, CKD stage III, tobacco/alcohol use, medical noncompliance.  Per chart review patient lives with spouse.  Two-level home full bath on main level and bedroom upstairs.  Independent prior to admission and retired.  Family reports they can assist 24/7 after discharge.  Presented 04/21/2023 with acute onset facial droop and aphasia.  Initial cranial CT scan showed focal area of decreased gray-white matter differentiation within the posterior left frontal lobe, concerning for acute or subacute infarct.  No intracranial hemorrhage.  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  Calcified plaque in the intracranial ICAs resulting in mild to moderate stenosis bilaterally.  CT cerebral perfusion scan showed a 54 cc ischemic brain identified primarily in the left MCA distribution.  Admission chemistries unremarkable except creatinine 1.41, alcohol negative, urine drug screen positive cocaine, hemoglobin A1c 6.3.  Patient underwent IR thrombectomy 04/21/2023 without complications per Dr. Corliss Skains.  Echocardiogram ejection fraction of 60 to 65% no wall motion abnormalities.  Patient was transferred to the ICU after thrombectomy and extubated.  Patient remains n.p.o. with alternative means of nutritional support.  He has a cortrak in place.  He is frustrated by his n.p.o. status.  Patient is currently on full-strength aspirin as well as IV heparin plan to change to Eliquis 5 mg twice daily after SLP determines as he can tolerate p.o. meds.  Patient did  have bouts of bradycardia into the 40s and Coreg was held and he does currently remain on Imdur as prior to admission.  Therapy evaluations completed due to patient decreased functional mobility was admitted for a comprehensive rehab program   Review of Systems  Constitutional:  Positive for fever. Negative for malaise/fatigue.  HENT:  Negative for hearing loss.   Eyes:  Negative for blurred vision and double vision.  Respiratory:  Positive for cough. Negative for shortness of breath and wheezing.   Cardiovascular:  Negative for chest pain, palpitations and leg swelling.  Gastrointestinal:  Positive for constipation. Negative for abdominal pain, diarrhea, heartburn, nausea and vomiting.  Genitourinary:  Negative for dysuria, flank pain and hematuria.  Musculoskeletal:  Positive for myalgias. Negative for joint pain.  Skin:  Negative for rash.  Neurological:  Positive for speech change and focal weakness.  All other systems reviewed and are negative.       Past Medical History:  Diagnosis Date   Alcohol abuse     Atrial fibrillation (HCC)     CHF (congestive heart failure) (HCC)     CKD (chronic kidney disease) stage 3, GFR 30-59 ml/min (HCC)     Hyperlipidemia     Hypertension     Noncompliance               Past Surgical History:  Procedure Laterality Date   INGUINAL HERNIA REPAIR Right 07/17/2022    Procedure: HERNIA REPAIR INGUINAL INCARCERATED;  Surgeon: Lucretia Roers, MD;  Location: [facility];  Service: General;  Laterality: Right;   IR CT HEAD LTD   04/21/2023   IR PERCUTANEOUS ART THROMBECTOMY/INFUSION INTRACRANIAL INC DIAG ANGIO  04/21/2023   RADIOLOGY WITH ANESTHESIA N/A 04/21/2023    Procedure: IR WITH ANESTHESIA;  Surgeon: Radiologist, Medication, MD;  Location: [facility];  Service: Radiology;  Laterality: N/A;   XI ROBOTIC ASSISTED INGUINAL HERNIA REPAIR WITH MESH Left 03/19/2023    Procedure: XI ROBOTIC ASSISTED INGUINAL HERNIA REPAIR WITH MESH;  Surgeon: Franky Macho,  MD;  Location: [facility];  Service: General;  Laterality: Left;             Family History  Problem Relation Age of Onset   Diabetes Mother     Hypertension Mother     Cancer Father          Prostate Cancer   Hypertension Father     Diabetes Sister     Diabetes Brother     Hypertension Brother     Hypertension Brother          Social History:  reports that he has quit smoking. His smoking use included cigars. He has never used smokeless tobacco. He reports current alcohol use of about 7.0 standard drinks of alcohol per week. He reports current drug use. Drug: Cocaine. Allergies:  Allergies  No Known Allergies         Medications Prior to Admission  Medication Sig Dispense Refill   amLODipine (NORVASC) 10 MG tablet Take 1 tablet (10 mg total) by mouth daily for 30 days. 30 tablet 0   carvedilol (COREG) 25 MG tablet Take 1 tablet (25 mg total) by mouth 2 (two) times daily with a meal for 30 days. 60 tablet 0   cholecalciferol (VITAMIN D3) 25 MCG (1000 UNIT) tablet Take 1,000 Units by mouth daily.       hydrALAZINE (APRESOLINE) 50 MG tablet Take 1 tablet (50 mg total) by mouth 3 (three) times daily. 270 tablet 3   isosorbide mononitrate (IMDUR) 60 MG 24 hr tablet TAKE ONE TABLET BY MOUTH ONCE DAILY. 90 tablet 1   pravastatin (PRAVACHOL) 20 MG tablet Take 20 mg by mouth daily.                  Home: Home Living Family/patient expects to be discharged to:: Private residence Living Arrangements:  (spouse , daughter and grandchildren) Available Help at Discharge: Family, Available 24 hours/day Type of Home: House Home Access: Stairs to enter Secretary/administrator of Steps: 1 Entrance Stairs-Rails: None Home Layout: Two level, Full bath on main level, Bed/bath upstairs Alternate Level Stairs-Number of Steps: 13 Alternate Level Stairs-Rails: Right, Left Bathroom Shower/Tub: Tub/shower unit, Buyer, retail: Yes Home Equipment: None  (Wife uses a rollator; RW)  Lives With:  (wife, daughter, and grand children)   Functional History: Prior Function Prior Level of Function : Independent/Modified Independent, Driving, Other (comment) (retired) ADLs Comments: independent   Functional Status:  Mobility: Bed Mobility Overal bed mobility: Needs Assistance Bed Mobility: Supine to Sit Supine to sit: Used rails, Supervision, HOB elevated Sit to supine: Contact guard assist, Used rails, HOB elevated General bed mobility comments: CGA for safety, effortful to rise but performed without physical help of therapist, using rail as needed. Able to scoot with CGA along side of bed. Transfers Overall transfer level: Needs assistance Equipment used: Rolling walker (2 wheels) Transfers: Sit to/from Stand Sit to Stand: Contact guard assist Bed to/from chair/wheelchair/BSC transfer type:: Step pivot Step pivot transfers: Min assist General transfer comment: cuing for hand placement - RUE on RW, push to standing with LUE Ambulation/Gait Ambulation/Gait assistance: Min assist Gait Distance (Feet):  200 Feet (pt walked to nurses station>back to end of hallway past room>room) Assistive device: Rolling walker (2 wheels) Gait Pattern/deviations: Decreased dorsiflexion - right, Decreased stride length General Gait Details: Pt was able to ambulate increased distances on this date. Pt requires cuing to reposition & maintain RUE on RW vs sliding off to the side 2/2 decreased grasp. Pt requires min assist to turn RW at times. Gait velocity: decreased Gait velocity interpretation: <1.31 ft/sec, indicative of household ambulator   ADL: ADL Overall ADL's : Needs assistance/impaired Eating/Feeding: NPO Grooming: Minimal assistance, Standing, Wash/dry face Grooming Details (indicate cue type and reason): using R UE to attempt to wring out washcloth but using L hand to complete task to wash face. assist to place moisturizer in R hand with pt using L  hand to open cap and place on lips standing at sink Upper Body Bathing: Moderate assistance Lower Body Bathing: Moderate assistance, Sit to/from stand Upper Body Dressing : Maximal assistance, Sitting Lower Body Dressing: Total assistance, +2 for physical assistance, +2 for safety/equipment, Sit to/from stand Lower Body Dressing Details (indicate cue type and reason): pt able to assist with donning B slippers Toilet Transfer: Minimal assistance, +2 for physical assistance, +2 for safety/equipment Toilet Transfer Details (indicate cue type and reason): simulated; urinal in room Toileting- Clothing Manipulation and Hygiene: Maximal assistance Functional mobility during ADLs: Minimal assistance, Rolling walker (2 wheels), Cueing for sequencing, Cueing for safety   Cognition: Cognition Overall Cognitive Status: Difficult to assess Arousal/Alertness: Awake/alert Orientation Level: Disoriented to place, Disoriented to time, Disoriented to situation Awareness: Impaired Awareness Impairment: Emergent impairment Comments: inattentive to R side Cognition Arousal: Alert Behavior During Therapy: WFL for tasks assessed/performed, Flat affect Overall Cognitive Status: Difficult to assess Area of Impairment: Safety/judgement, Problem solving, Following commands, Orientation, Awareness, Attention Orientation Level: Disoriented to, Time Current Attention Level: Sustained Following Commands: Follows one step commands with increased time, Follows one step commands inconsistently Safety/Judgement: Decreased awareness of deficits, Decreased awareness of safety Awareness: Intellectual Problem Solving: Slow processing, Decreased initiation, Difficulty sequencing, Requires verbal cues, Requires tactile cues General Comments: consistent command following. able to answer being at hospital but increased time needed to respond to being here due to stroke. cues for R attention, laughing appropriately during  session. difficulty reporting how long he has been married or which football type/teams he likes Difficult to assess due to: Impaired communication   Physical Exam: Blood pressure 132/77, pulse (!) 56, temperature 98.2 F (36.8 C), temperature source Oral, resp. rate 16, height 5\' 7"  (1.702 m), weight 76.2 kg, SpO2 100%.  General:  No apparent distress, sitting in chair HEENT: Head is normocephalic, atraumatic, MMM, coretrak in place Neck: Supple without JVD or lymphadenopathy Heart: Mild bradycardia Chest: CTA bilaterally, no increased work of breathing.  On room air Abdomen: Soft, non-tender, non-distended, bowel sounds positive. Extremities: No clubbing, cyanosis, or edema.  Psych: Appropriate, pleasant Skin: Clean and intact without signs of breakdown Neuro: Alert to person, not date, he was able to choose hospital and and stroke when given choices.  Aphasia expressive more than receptive.  Able to name 2 out of 3 items.  He was able to repeat part of the phrase I gave him.  Left gaze preference.  EOMI.  mild right-sided facial weakness.  Facial sensation intact bilaterally to light touch. RUE: 4-/5 Deltoid, 4-/5 Biceps, 4-/5 Triceps, 4-/5 Grip LUE: 5/5 Deltoid, 5/5 Biceps, 5/5 Triceps,  5/5 Grip RLE: HF 4/5, KE 4+/5, ADF 4+/5, APF 4+/5 LLE: HF 5/5,  KE 5/5, ADF 5/5, APF 5/5 Incision intact light touch in all 4 extremities Finger-nose and intact on the left, altered on the right in proportion to his weakness Musculoskeletal:  Normal bulk, no hypertonia noted No joint swelling noted Right upper extremity fifth digit contracture-wife reports this is chronic       Lab Results Last 48 Hours        Results for orders placed or performed during the hospital encounter of 04/21/23 (from the past 48 hour(s))  Basic metabolic panel     Status: Abnormal    Collection Time: 04/26/23  7:11 AM  Result Value Ref Range    Sodium 136 135 - 145 mmol/L    Potassium 3.8 3.5 - 5.1 mmol/L     Chloride 102 98 - 111 mmol/L    CO2 23 22 - 32 mmol/L    Glucose, Bld 149 (H) 70 - 99 mg/dL      Comment: Glucose reference range applies only to samples taken after fasting for at least 8 hours.    BUN 32 (H) 8 - 23 mg/dL    Creatinine, Ser 1.61 (H) 0.61 - 1.24 mg/dL    Calcium 8.7 (L) 8.9 - 10.3 mg/dL    GFR, Estimated 49 (L) >60 mL/min      Comment: (NOTE) Calculated using the CKD-EPI Creatinine Equation (2021)      Anion gap 11 5 - 15      Comment: Performed at [facility], [address], [place], [place] [address]  CBC     Status: Abnormal    Collection Time: 04/26/23  7:11 AM  Result Value Ref Range    WBC 4.9 4.0 - 10.5 K/uL    RBC 5.13 4.22 - 5.81 MIL/uL    Hemoglobin 14.1 13.0 - 17.0 g/dL    HCT 54.0 98.1 - 19.1 %    MCV 83.8 80.0 - 100.0 fL    MCH 27.5 26.0 - 34.0 pg    MCHC 32.8 30.0 - 36.0 g/dL    RDW 47.8 29.5 - 62.1 %    Platelets 121 (L) 150 - 400 K/uL      Comment: REPEATED TO VERIFY    nRBC 0.0 0.0 - 0.2 %      Comment: Performed at [facility], [address], [place], [place] [address]  Glucose, capillary     Status: Abnormal    Collection Time: 04/26/23  8:08 AM  Result Value Ref Range    Glucose-Capillary 153 (H) 70 - 99 mg/dL      Comment: Glucose reference range applies only to samples taken after fasting for at least 8 hours.  Glucose, capillary     Status: Abnormal    Collection Time: 04/26/23 11:28 AM  Result Value Ref Range    Glucose-Capillary 171 (H) 70 - 99 mg/dL      Comment: Glucose reference range applies only to samples taken after fasting for at least 8 hours.  Glucose, capillary     Status: Abnormal    Collection Time: 04/26/23  3:55 PM  Result Value Ref Range    Glucose-Capillary 146 (H) 70 - 99 mg/dL      Comment: Glucose reference range applies only to samples taken after fasting for at least 8 hours.  Heparin level (unfractionated)     Status: Abnormal    Collection Time: 04/26/23  6:53 PM  Result Value Ref Range     Heparin Unfractionated <0.10 (L) 0.30 - 0.70 IU/mL  Comment: (NOTE) The clinical reportable range upper limit is being lowered to >1.10 to align with the FDA approved guidance for the current laboratory assay.   If heparin results are below expected values, and patient dosage has  been confirmed, suggest follow up testing of antithrombin III levels. Performed at [facility], [address], [place], [place] [address]    Glucose, capillary     Status: Abnormal    Collection Time: 04/26/23  8:02 PM  Result Value Ref Range    Glucose-Capillary 152 (H) 70 - 99 mg/dL      Comment: Glucose reference range applies only to samples taken after fasting for at least 8 hours.  Glucose, capillary     Status: Abnormal    Collection Time: 04/26/23 11:44 PM  Result Value Ref Range    Glucose-Capillary 148 (H) 70 - 99 mg/dL      Comment: Glucose reference range applies only to samples taken after fasting for at least 8 hours.  Glucose, capillary     Status: Abnormal    Collection Time: 04/27/23  3:38 AM  Result Value Ref Range    Glucose-Capillary 176 (H) 70 - 99 mg/dL      Comment: Glucose reference range applies only to samples taken after fasting for at least 8 hours.  CBC     Status: Abnormal    Collection Time: 04/27/23  5:47 AM  Result Value Ref Range    WBC 4.6 4.0 - 10.5 K/uL    RBC 4.63 4.22 - 5.81 MIL/uL    Hemoglobin 12.9 (L) 13.0 - 17.0 g/dL    HCT 24.4 01.0 - 27.2 %    MCV 85.1 80.0 - 100.0 fL    MCH 27.9 26.0 - 34.0 pg    MCHC 32.7 30.0 - 36.0 g/dL    RDW 53.6 64.4 - 03.4 %    Platelets 128 (L) 150 - 400 K/uL    nRBC 0.0 0.0 - 0.2 %      Comment: Performed at [facility], [address] [place], [place] [address]  Heparin level (unfractionated)     Status: Abnormal    Collection Time: 04/27/23  5:47 AM  Result Value Ref Range    Heparin Unfractionated <0.10 (L) 0.30 - 0.70 IU/mL      Comment: (NOTE) The clinical reportable range upper limit is being  lowered to >1.10 to align with the FDA approved guidance for the current laboratory assay.   If heparin results are below expected values, and patient dosage has  been confirmed, suggest follow up testing of antithrombin III levels. Performed at [facility], [address] [place], [place] [address]    Basic metabolic panel     Status: Abnormal    Collection Time: 04/27/23  5:47 AM  Result Value Ref Range    Sodium 135 135 - 145 mmol/L    Potassium 4.2 3.5 - 5.1 mmol/L    Chloride 100 98 - 111 mmol/L    CO2 26 22 - 32 mmol/L    Glucose, Bld 176 (H) 70 - 99 mg/dL      Comment: Glucose reference range applies only to samples taken after fasting for at least 8 hours.    BUN 30 (H) 8 - 23 mg/dL    Creatinine, Ser 5.64 (H) 0.61 - 1.24 mg/dL    Calcium 8.6 (L) 8.9 - 10.3 mg/dL    GFR, Estimated 52 (L) >60 mL/min  Comment: (NOTE) Calculated using the CKD-EPI Creatinine Equation (2021)      Anion gap 9 5 - 15      Comment: Performed at [facility], [address] [place], [place] [address]  Glucose, capillary     Status: Abnormal    Collection Time: 04/27/23  7:42 AM  Result Value Ref Range    Glucose-Capillary 147 (H) 70 - 99 mg/dL      Comment: Glucose reference range applies only to samples taken after fasting for at least 8 hours.  Glucose, capillary     Status: Abnormal    Collection Time: 04/27/23 11:29 AM  Result Value Ref Range    Glucose-Capillary 165 (H) 70 - 99 mg/dL      Comment: Glucose reference range applies only to samples taken after fasting for at least 8 hours.  Heparin level (unfractionated)     Status: Abnormal    Collection Time: 04/27/23  2:48 PM  Result Value Ref Range    Heparin Unfractionated <0.10 (L) 0.30 - 0.70 IU/mL      Comment: (NOTE) The clinical reportable range upper limit is being lowered to >1.10 to align with the FDA approved guidance for the current laboratory assay.   If heparin results are below expected values, and  patient dosage has  been confirmed, suggest follow up testing of antithrombin III levels. Performed at [facility], [address] [place], [place] [address]    Glucose, capillary     Status: Abnormal    Collection Time: 04/27/23  3:40 PM  Result Value Ref Range    Glucose-Capillary 170 (H) 70 - 99 mg/dL      Comment: Glucose reference range applies only to samples taken after fasting for at least 8 hours.  Glucose, capillary     Status: Abnormal    Collection Time: 04/27/23  7:36 PM  Result Value Ref Range    Glucose-Capillary 121 (H) 70 - 99 mg/dL      Comment: Glucose reference range applies only to samples taken after fasting for at least 8 hours.  Glucose, capillary     Status: Abnormal    Collection Time: 04/27/23 11:24 PM  Result Value Ref Range    Glucose-Capillary 157 (H) 70 - 99 mg/dL      Comment: Glucose reference range applies only to samples taken after fasting for at least 8 hours.  Glucose, capillary     Status: Abnormal    Collection Time: 04/28/23  4:01 AM  Result Value Ref Range    Glucose-Capillary 152 (H) 70 - 99 mg/dL      Comment: Glucose reference range applies only to samples taken after fasting for at least 8 hours.       Imaging Results (Last 48 hours)  DG Swallowing Func-Speech Pathology   Result Date: 04/26/2023 Table formatting from the original result was not included. Modified Barium Swallow Study Patient Details Name: Timothy [name] MRN: [id number] Date of Birth: [birth year] Today's Date: 04/26/2023 HPI/PMH: HPI: Patient is 67 y.o. 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 Pt s/p revascularization of the of the superior branch of the right MCA with mechanical thrombectomy achieving TICI 2C revascularization. PMH: ETOH abuse, CHF, CKD, HTN. Clinical Impression: Clinical Impression: Pt's oropharyngeal dysphagia includes timing, motor, and sensory components. He has reduced lingual control  with disorganized posterior transit although minimal oral residue. There is anterior loss out of the  R side of his mouth though. He has reduced hyolaryngeal movement and laryngeal vestibule closure, and when swallow is mistimed, he penetrates during the swallow across consistencies. Penetration also occurred occasionally from intermittent vallecular residue. Pt's volitional cough is weak and ineffective at clearing penetrates, which continue to fall over time until aspiration occurs. Aspiration for the most part is silent, although spontaneous coughing is also not effective. Pt has a hard time follow commands consistently enough to use strategies, but a chin tuck also did not improve airway protection. Pt is not yet ready for PO diet. Would continue Cortrak and consider therapeutic trials of boluses with SLP. Factors that may increase risk of adverse event in presence of aspiration [name] & [name] 2021): Factors that may increase risk of adverse event in presence of aspiration [name] & [name] 2021): Aspiration of thick, dense, and/or acidic materials; Weak cough; Presence of tubes (ETT, trach, NG, etc.); Dependence for feeding and/or oral hygiene; Limited mobility; Reduced saliva Recommendations/Plan: Swallowing Evaluation Recommendations Swallowing Evaluation Recommendations Recommendations: NPO Medication Administration: Via alternative means Oral care recommendations: Oral care QID (4x/day) Caregiver Recommendations: Have oral suction available Treatment Plan Treatment Plan Treatment recommendations: Therapy as outlined in treatment plan below Follow-up recommendations: Acute inpatient rehab (3 hours/day) Functional status assessment: Patient has had a recent decline in their functional status and demonstrates the ability to make significant improvements in function in a reasonable and predictable amount of time. Treatment frequency: Min 2x/week Treatment duration: 2 weeks Interventions: Aspiration precaution  training; Compensatory techniques; Patient/family education; Trials of upgraded texture/liquids; Diet toleration management by SLP Recommendations Recommendations for follow up therapy are one component of a multi-disciplinary discharge planning process, led by the attending physician.  Recommendations may be updated based on patient status, additional functional criteria and insurance authorization. Assessment: Orofacial Exam: Orofacial Exam Oral Cavity: Oral Hygiene: WFL Oral Cavity - Dentition: Poor condition; Missing dentition Orofacial Anatomy: WFL Anatomy: Anatomy: WFL Boluses Administered: Boluses Administered Boluses Administered: Thin liquids (Level 0); Mildly thick liquids (Level 2, nectar thick); Moderately thick liquids (Level 3, honey thick); Puree  Oral Impairment Domain: Oral Impairment Domain Lip Closure: Escape beyond mid-chin Tongue control during bolus hold: Posterior escape of less than half of bolus Bolus preparation/mastication: -- (solid deferred) Bolus transport/lingual motion: Repetitive/disorganized tongue motion Oral residue: Trace residue lining oral structures Location of oral residue : Floor of mouth; Lateral sulci Initiation of pharyngeal swallow : Pyriform sinuses  Pharyngeal Impairment Domain: Pharyngeal Impairment Domain Soft palate elevation: No bolus between soft palate (SP)/pharyngeal wall (PW) Laryngeal elevation: Partial superior movement of thyroid cartilage/partial approximation of arytenoids to epiglottic petiole Anterior hyoid excursion: Partial anterior movement Epiglottic movement: Complete inversion Laryngeal vestibule closure: Incomplete, narrow column air/contrast in laryngeal vestibule Pharyngeal stripping wave : Present - complete Pharyngeal contraction (A/P view only): N/A Pharyngoesophageal segment opening: Complete distension and complete duration, no obstruction of flow Tongue base retraction: No contrast between tongue base and posterior pharyngeal wall (PPW)  Pharyngeal residue: Collection of residue within or on pharyngeal structures Location of pharyngeal residue: Valleculae; Pyriform sinuses  Esophageal Impairment Domain: No data recorded Pill: No data recorded Penetration/Aspiration Scale Score: Penetration/Aspiration Scale Score 8.  Material enters airway, passes BELOW cords without attempt by patient to eject out (silent aspiration) : Thin liquids (Level 0); Mildly thick liquids (Level 2, nectar thick); Moderately thick liquids (Level 3, honey thick); Puree Compensatory Strategies: Compensatory Strategies Compensatory strategies: Yes Chin tuck: Ineffective Ineffective Chin Tuck: Moderately thick liquid (Level 3, honey thick)   General  Information: Caregiver present: No  Diet Prior to this Study: NPO   Temperature : Normal   Respiratory Status: WFL   Supplemental O2: None (Room air)   History of Recent Intubation: Yes  Behavior/Cognition: Alert; Cooperative; Requires cueing Self-Feeding Abilities: Able to self-feed Baseline vocal quality/speech: Normal Volitional Cough: Able to elicit Volitional Swallow: Able to elicit Exam Limitations: No limitations Goal Planning: Prognosis for improved oropharyngeal function: Good Barriers to Reach Goals: Language deficits No data recorded Patient/Family Stated Goal: none stated Consulted and agree with results and recommendations: Patient Pain: Pain Assessment Pain Assessment: Faces Faces Pain Scale: 0 End of Session: Start Time:SLP Start Time (ACUTE ONLY): 1148 Stop Time: SLP Stop Time (ACUTE ONLY): 1201 Time Calculation:SLP Time Calculation (min) (ACUTE ONLY): 13 min Charges: SLP Evaluations $ SLP Speech Visit: 1 Visit SLP Evaluations $MBS Swallow: 1 Procedure $Swallowing Treatment: 1 Procedure $Speech Treatment for Individual: 1 Procedure SLP visit diagnosis: SLP Visit Diagnosis: Dysphagia, oropharyngeal phase (R13.12) Past Medical History: Past Medical History: Diagnosis Date  Alcohol abuse   Atrial fibrillation (HCC)   CHF  (congestive heart failure) (HCC)   CKD (chronic kidney disease) stage 3, GFR 30-59 ml/min (HCC)   Hyperlipidemia   Hypertension   Noncompliance  Past Surgical History: Past Surgical History: Procedure Laterality Date  INGUINAL HERNIA REPAIR Right 07/17/2022  Procedure: HERNIA REPAIR INGUINAL INCARCERATED;  Surgeon: Lucretia Roers, MD;  Location: [facility];  Service: General;  Laterality: Right;  IR CT HEAD LTD  04/21/2023  IR PERCUTANEOUS ART THROMBECTOMY/INFUSION INTRACRANIAL INC DIAG ANGIO  04/21/2023  RADIOLOGY WITH ANESTHESIA N/A 04/21/2023  Procedure: IR WITH ANESTHESIA;  Surgeon: Radiologist, Medication, MD;  Location: [facility];  Service: Radiology;  Laterality: N/A;  XI ROBOTIC ASSISTED INGUINAL HERNIA REPAIR WITH MESH Left 03/19/2023  Procedure: XI ROBOTIC ASSISTED INGUINAL HERNIA REPAIR WITH MESH;  Surgeon: Franky Macho, MD;  Location: [facility];  Service: General;  Laterality: Left; Mahala Menghini., M.A. CCC-SLP Acute Rehabilitation Services Office [phone] Secure chat preferred 04/26/2023, 1:08 PM          Blood pressure 132/77, pulse (!) 56, temperature 98.2 F (36.8 C), temperature source Oral, resp. rate 16, height 5\' 7"  (1.702 m), weight 76.2 kg, SpO2 100%.   Medical Problem List and Plan: 1. 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  Etiology suspected to be A-fib not on anticoagulation versus large vessel disease             -patient may shower             -ELOS/Goals: 10 to 14 days, supervision to min assist with PT, OT, SLP             -Admit to CIR 2.  Antithrombotics: -DVT/anticoagulation:  Pharmaceutical: Heparin initiated for anticoagulation. PLAN TO TRANSITION TO ELIQUIS 5 MG BID ONCE ABLE TO SWALLOW.              -antiplatelet therapy: Aspirin 325 mg daily 3. Pain Management: Al-Anon as needed 4. Mood/Behavior/Sleep: Provide emotional support             -antipsychotic agents: N/A 5. Neuropsych/cognition: This  patient is not capable of making decisions on his own behalf. 6. Skin/Wound Care: Routine skin checks 7. Fluids/Electrolytes/Nutrition: Routine and analysis with follow-up chemistries 8.  Dysphagia.  Currently NPO, Cortrak. Speech therapy follow-up. 9.  Hypertension.  Imdur 60 mg daily.  Monitor with increased mobility.  Long-term blood pressure goal normotensive 10.  Bradycardia  into the 40s.  Coreg held.  Avoid negative chronotropic medications.  Monitor with increased mobility 11.  Hyperlipidemia.  Pravachol 12.  CKD stage III.  Follow-up chemistries 13.  Diastolic congestive heart failure.  Monitor for any signs of fluid overload.  14.  History of tobacco/alcohol use/Cocaine.  Provide counseling 15.  Medical noncompliance.  Counseling 16.  Atrial fibrillation.  Hold Coreg due to bradycardia.  Continue ASA and heparin until able to swallow and can switch to [employer], PA-C 04/28/2023   I have personally performed a face to face diagnostic evaluation of this patient and formulated the key components of the plan.  Additionally, I have personally reviewed laboratory data, imaging studies, as well as relevant notes and concur with the physician assistant's documentation above.   The patient's status has not changed from the original H&P.  Any changes in documentation from the acute care chart have been noted above.   Fanny Dance, MD, [place]

## 2023-04-29 ENCOUNTER — Inpatient Hospital Stay (HOSPITAL_COMMUNITY): Payer: Medicare PPO

## 2023-04-29 DIAGNOSIS — I63512 Cerebral infarction due to unspecified occlusion or stenosis of left middle cerebral artery: Secondary | ICD-10-CM | POA: Diagnosis not present

## 2023-04-29 LAB — CBC WITH DIFFERENTIAL/PLATELET
Abs Immature Granulocytes: 0.02 10*3/uL (ref 0.00–0.07)
Basophils Absolute: 0.1 10*3/uL (ref 0.0–0.1)
Basophils Relative: 1 %
Eosinophils Absolute: 0.2 10*3/uL (ref 0.0–0.5)
Eosinophils Relative: 5 %
HCT: 39.5 % (ref 39.0–52.0)
Hemoglobin: 12.7 g/dL — ABNORMAL LOW (ref 13.0–17.0)
Immature Granulocytes: 0 %
Lymphocytes Relative: 22 %
Lymphs Abs: 1 10*3/uL (ref 0.7–4.0)
MCH: 27.2 pg (ref 26.0–34.0)
MCHC: 32.2 g/dL (ref 30.0–36.0)
MCV: 84.6 fL (ref 80.0–100.0)
Monocytes Absolute: 0.4 10*3/uL (ref 0.1–1.0)
Monocytes Relative: 10 %
Neutro Abs: 2.8 10*3/uL (ref 1.7–7.7)
Neutrophils Relative %: 62 %
Platelets: 130 10*3/uL — ABNORMAL LOW (ref 150–400)
RBC: 4.67 MIL/uL (ref 4.22–5.81)
RDW: 13.5 % (ref 11.5–15.5)
WBC: 4.6 10*3/uL (ref 4.0–10.5)
nRBC: 0 % (ref 0.0–0.2)

## 2023-04-29 LAB — COMPREHENSIVE METABOLIC PANEL
ALT: 16 U/L (ref 0–44)
AST: 18 U/L (ref 15–41)
Albumin: 3.1 g/dL — ABNORMAL LOW (ref 3.5–5.0)
Alkaline Phosphatase: 54 U/L (ref 38–126)
Anion gap: 5 (ref 5–15)
BUN: 23 mg/dL (ref 8–23)
CO2: 29 mmol/L (ref 22–32)
Calcium: 8.8 mg/dL — ABNORMAL LOW (ref 8.9–10.3)
Chloride: 102 mmol/L (ref 98–111)
Creatinine, Ser: 1.39 mg/dL — ABNORMAL HIGH (ref 0.61–1.24)
GFR, Estimated: 55 mL/min — ABNORMAL LOW (ref >60)
Glucose, Bld: 120 mg/dL — ABNORMAL HIGH (ref 70–99)
Potassium: 5.3 mmol/L — ABNORMAL HIGH (ref 3.5–5.1)
Sodium: 136 mmol/L (ref 135–145)
Total Bilirubin: 0.8 mg/dL (ref 0.3–1.2)
Total Protein: 5.7 g/dL — ABNORMAL LOW (ref 6.5–8.1)

## 2023-04-29 LAB — GLUCOSE, CAPILLARY
Glucose-Capillary: 148 mg/dL — ABNORMAL HIGH (ref 70–99)
Glucose-Capillary: 129 mg/dL — ABNORMAL HIGH (ref 70–99)
Glucose-Capillary: 121 mg/dL — ABNORMAL HIGH (ref 70–99)
Glucose-Capillary: 97 mg/dL (ref 70–99)

## 2023-04-29 LAB — HEPARIN LEVEL (UNFRACTIONATED): Heparin Unfractionated: 0.44 IU/mL (ref 0.30–0.70)

## 2023-04-29 MED ORDER — FREE WATER
200.0000 mL | Status: DC
  Administered 2023-04-29 – 2023-05-03 (×23): 200 mL

## 2023-04-29 MED ORDER — SORBITOL 70 % SOLN
30.0000 mL | Freq: Once | Status: AC
  Administered 2023-04-29: 30 mL
  Filled 2023-04-29: qty 30

## 2023-04-29 MED ORDER — HYDROCERIN EX CREA
TOPICAL_CREAM | Freq: Two times a day (BID) | CUTANEOUS | Status: DC
  Administered 2023-05-14: 1 via TOPICAL
  Filled 2023-04-29 (×2): qty 113

## 2023-04-29 MED ORDER — ISOSORBIDE DINITRATE 10 MG PO TABS
20.0000 mg | ORAL_TABLET | Freq: Three times a day (TID) | ORAL | Status: DC
  Administered 2023-04-29 – 2023-05-04 (×15): 20 mg
  Filled 2023-04-29 (×18): qty 2

## 2023-04-29 MED ORDER — INSULIN ASPART 100 UNIT/ML IJ SOLN
0.0000 [IU] | INTRAMUSCULAR | Status: DC
  Administered 2023-04-29 – 2023-05-03 (×19): 1 [IU] via SUBCUTANEOUS
  Administered 2023-05-03: 2 [IU] via SUBCUTANEOUS
  Administered 2023-05-03 – 2023-05-04 (×3): 1 [IU] via SUBCUTANEOUS
  Administered 2023-05-04: 2 [IU] via SUBCUTANEOUS
  Administered 2023-05-05: 1 [IU] via SUBCUTANEOUS
  Administered 2023-05-05: 2 [IU] via SUBCUTANEOUS
  Administered 2023-05-05: 1 [IU] via SUBCUTANEOUS

## 2023-04-29 NOTE — Progress Notes (Addendum)
ANTICOAGULATION CONSULT NOTE   Pharmacy Consult for heparin Indication: atrial fibrillation and stroke  No Known Allergies  Patient Measurements: Height: 5\' 7"  (170.2 cm) Weight: 70 kg (154 lb 5.2 oz) IBW/kg (Calculated) : 66.1 Heparin Dosing Weight: 68 kg  Vital Signs: Temp: 98.4 F (36.9 C) (09/26 0613) Temp Source: Oral (09/26 0613) BP: 142/70 (09/26 1610) Pulse Rate: 51 (09/26 0613)  Labs: Recent Labs    04/27/23 0547 04/27/23 1448 04/28/23 0702 04/28/23 1717 04/29/23 0650  HGB 12.9*  --  12.2*  --  12.7*  HCT 39.4  --  38.4*  --  39.5  PLT 128*  --  121*  --  130*  HEPARINUNFRC <0.10*   < > 0.96* 0.85* 0.44  CREATININE 1.46*  --  1.34*  --  1.39*   < > = values in this interval not displayed.    Estimated Creatinine Clearance: 46.9 mL/min (A) (by C-G formula based on SCr of 1.39 mg/dL (H)).   Medical History: Past Medical History:  Diagnosis Date   Alcohol abuse    Atrial fibrillation (HCC)    CHF (congestive heart failure) (HCC)    CKD (chronic kidney disease) stage 3, GFR 30-59 ml/min (HCC)    Hyperlipidemia    Hypertension    Noncompliance     Assessment: Pt was admitted for new CVA. Hx of AF but not on anticoagulation. He got thrombectomy on 9/18. Hgb wnl, Plt 128.  Heparin level therapeutic   Goal of Therapy:  Heparin level 0.3-0.5 units/ml Monitor platelets by anticoagulation protocol: Yes   Plan:  Continue heparin at 1200 units / hr Follow up AM labs  Thank you Okey Regal, PharmD

## 2023-04-29 NOTE — Progress Notes (Signed)
Inpatient Rehabilitation Care Coordinator Assessment and Plan Patient Details  Name: RAMEER BRUCKMAN MRN: [id number] Date of Birth: 1956/05/05  Today's Date: 04/29/2023  Hospital Problems: Principal Problem:   Left middle cerebral artery stroke [facility])  Past Medical History:  Past Medical History:  Diagnosis Date   Alcohol abuse    Atrial fibrillation (HCC)    CHF (congestive heart failure) (HCC)    CKD (chronic kidney disease) stage 3, GFR 30-59 ml/min (HCC)    Hyperlipidemia    Hypertension    Noncompliance    Past Surgical History:  Past Surgical History:  Procedure Laterality Date   INGUINAL HERNIA REPAIR Right 07/17/2022   Procedure: HERNIA REPAIR INGUINAL INCARCERATED;  Surgeon: Lucretia Roers, MD;  Location: [facility];  Service: General;  Laterality: Right;   IR CT HEAD LTD  04/21/2023   IR PERCUTANEOUS ART THROMBECTOMY/INFUSION INTRACRANIAL INC DIAG ANGIO  04/21/2023   RADIOLOGY WITH ANESTHESIA N/A 04/21/2023   Procedure: IR WITH ANESTHESIA;  Surgeon: Radiologist, Medication, MD;  Location: [facility];  Service: Radiology;  Laterality: N/A;   XI ROBOTIC ASSISTED INGUINAL HERNIA REPAIR WITH MESH Left 03/19/2023   Procedure: XI ROBOTIC ASSISTED INGUINAL HERNIA REPAIR WITH MESH;  Surgeon: Franky Macho, MD;  Location: [facility];  Service: General;  Laterality: Left;   Social History:  reports that he has quit smoking. His smoking use included cigars. He has never used smokeless tobacco. He reports current alcohol use of about 7.0 standard drinks of alcohol per week. He reports current drug use. Drug: Cocaine.  Family / Support Systems Marital Status: Married Patient Roles: Spouse, Parent, Other (Comment) (grandfather) Spouse/Significant Other: Linna Caprice [phone] Children: Ulice Dash [phone] Another daughter Other Supports: Grandson 54 yo in the home Anticipated Caregiver: wife, daughter's and adult grandchildren Ability/Limitations of Caregiver: Wife has heath  issues-hx-CVA and uses a rollator or cane. Both daughter's work one at night and one during the day Caregiver Availability: 24/7 Family Dynamics: Close knit family who will pull together to support pt and mom. Pt is very independent and not one to sit still. They have friends and neighbors who are supportive.  Social History Preferred language: English Religion: Baptist Cultural Background: No issues Education: HS Health Literacy - How often do you need to have someone help you when you read instructions, pamphlets, or other written material from your doctor or pharmacy?: Never Writes: Yes Employment Status: Retired Marine scientist Issues: No issues Guardian/Conservator: None-according to MD pt is not fully capable of making his own decisions at this time. His wife is here daily and will be the one to make any decisions while here   Abuse/Neglect Abuse/Neglect Assessment Can Be Completed: Yes Physical Abuse: Denies Verbal Abuse: Denies Sexual Abuse: Denies Exploitation of patient/patient's resources: Denies Self-Neglect: Denies  Patient response to: Social Isolation - How often do you feel lonely or isolated from those around you?: Never  Emotional Status Pt's affect, behavior and adjustment status: Pt lets his wife answer for him unless you talk about the cowboys who he loves. Wife reports he is very independent and always outside mowing yards and doing other things. He is active and doesn't like to sit still and/or rely upon others. Recent Psychosocial Issues: other health issues-was helping wife since she has had a stroke Psychiatric History: No history would benefit from seeing neuro-psych while here due to subtance abuse issues Substance Abuse History: positive for cocaine upon admission-will need to address when wife not here. Will ask neuro-psych to see while here also  Patient / Family Perceptions, Expectations & Goals Pt/Family understanding of illness &  functional limitations: Pt and wife can explain his stroke and deficits he wants to be able to eat and is not happy about his NG tube in his nose. Wife palns to be here and talk to the MD often. Premorbid pt/family roles/activities: husband, father, grandfather, friend, neighbor, retiree Anticipated changes in roles/activities/participation: resume Pt/family expectations/goals: Pt states: " I want to eat.'  Wife states: " I hope he does well I have had a stroke also but not as bad."  [employer]: None Premorbid Home Care/DME Agencies: Other (Comment) (wife has cane and rollator sue uses) Transportation available at discharge: daughter's-pt did drive PTA Is the patient able to respond to transportation needs?: Yes In the past 12 months, has lack of transportation kept you from medical appointments or from getting medications?: No In the past 12 months, has lack of transportation kept you from meetings, work, or from getting things needed for daily living?: No Resource referrals recommended: Neuropsychology  Discharge Planning Living Arrangements: Spouse/significant other, Children, Other relatives Support Systems: Spouse/significant other, Children, Other relatives, Friends/neighbors Type of Residence: Private residence Insurance Resources: Media planner (specify) Engineer, materials) Financial Resources: Social Security, Family Support Financial Screen Referred: No Living Expenses: Own Money Management: Patient, Spouse Does the patient have any problems obtaining your medications?: No Home Management: wife and daughter's Patient/Family Preliminary Plans: Return home with wife and two daughter's along with grandson. Both daughter's work one third and one during the day. Their grandson is disabled due to a liver transplant. Wife has issues due to her own CVA she had. Aware beng evaluated today and goals being set for here. Care Coordinator Barriers to Discharge:  Decreased caregiver support Care Coordinator Anticipated Follow Up Needs: HH/OP  Clinical Impression Pleasant gentleman who lets his wife answer for him unless talking about the cowboys. Wife stayed the night with pt and plans to be here a lot, encouraged her to go home some to take care of herself. Aware being evaluated and goals being set along with team conference on Tuesday. Neuro-psych to see while here  Lucy Chris 04/29/2023, 9:34 AM

## 2023-04-29 NOTE — Progress Notes (Signed)
Inpatient Rehabilitation Center Individual Statement of Services  Patient Name:  MARSALIS BEAULIEU  Date:  04/29/2023  Welcome to the Inpatient Rehabilitation Center.  Our goal is to provide you with an individualized program based on your diagnosis and situation, designed to meet your specific needs.  With this comprehensive rehabilitation program, you will be expected to participate in at least 3 hours of rehabilitation therapies Monday-Friday, with modified therapy programming on the weekends.  Your rehabilitation program will include the following services:  Physical Therapy (PT), Occupational Therapy (OT), Speech Therapy (ST), 24 hour per day rehabilitation nursing, Therapeutic Recreaction (TR), Neuropsychology, Care Coordinator, Rehabilitation Medicine, Nutrition Services, and Pharmacy Services  Weekly team conferences will be held on Tuesday to discuss your progress.  Your Inpatient Rehabilitation Care Coordinator will talk with you frequently to get your input and to update you on team discussions.  Team conferences with you and your family in attendance may also be held.  Expected length of stay: 12-14 days  Overall anticipated outcome: supervision with cues  Depending on your progress and recovery, your program may change. Your Inpatient Rehabilitation Care Coordinator will coordinate services and will keep you informed of any changes. Your Inpatient Rehabilitation Care Coordinator's name and contact numbers are listed  below.  The following services may also be recommended but are not provided by the Inpatient Rehabilitation Center:  Driving Evaluations Home Health Rehabiltiation Services Outpatient Rehabilitation Services    Arrangements will be made to provide these services after discharge if needed.  Arrangements include referral to agencies that provide these services.  Your insurance has been verified to be:  [employer] Your primary doctor is: Water quality scientist    Pertinent information will be shared with your doctor and your insurance company.  Inpatient Rehabilitation Care Coordinator:  Dossie Der, [place] [phone] or [place]  Information discussed with and copy given to patient by: Lucy Chris, 04/29/2023, 9:35 AM

## 2023-04-29 NOTE — Evaluation (Signed)
Physical Therapy Assessment and Plan  Patient Details  Name: Timothy [name] MRN: [id number] Date of Birth: 1956-03-02  PT Diagnosis: Difficulty walking, Hemiplegia dominant, and Muscle weakness Rehab Potential: Good ELOS: 12-14   Today's Date: 04/29/2023 PT Individual Time: 1020-1130 PT Individual Time Calculation (min): 70 min    Hospital Problem: Principal Problem:   Left middle cerebral artery stroke [facility])   Past Medical History:  Past Medical History:  Diagnosis Date   Alcohol abuse    Atrial fibrillation (HCC)    CHF (congestive heart failure) (HCC)    CKD (chronic kidney disease) stage 3, GFR 30-59 ml/min (HCC)    Hyperlipidemia    Hypertension    Noncompliance    Past Surgical History:  Past Surgical History:  Procedure Laterality Date   INGUINAL HERNIA REPAIR Right 07/17/2022   Procedure: HERNIA REPAIR INGUINAL INCARCERATED;  Surgeon: Lucretia Roers, MD;  Location: [facility];  Service: General;  Laterality: Right;   IR CT HEAD LTD  04/21/2023   IR PERCUTANEOUS ART THROMBECTOMY/INFUSION INTRACRANIAL INC DIAG ANGIO  04/21/2023   RADIOLOGY WITH ANESTHESIA N/A 04/21/2023   Procedure: IR WITH ANESTHESIA;  Surgeon: Radiologist, Medication, MD;  Location: [facility] OR;  Service: Radiology;  Laterality: N/A;   XI ROBOTIC ASSISTED INGUINAL HERNIA REPAIR WITH MESH Left 03/19/2023   Procedure: XI ROBOTIC ASSISTED INGUINAL HERNIA REPAIR WITH MESH;  Surgeon: Franky Macho, MD;  Location: [facility];  Service: General;  Laterality: Left;    Assessment & Plan Clinical Impression: Patient is a 67 year old right-handed male with history of atrial fibrillation on no anticoagulation, hypertension, diastolic congestive heart failure hyperlipidemia, CKD stage III, tobacco/alcohol use, medical noncompliance. Per chart review patient lives with spouse. Two-level home full bath on main level and bedroom upstairs. Independent prior to admission and retired. Family reports they can assist 24/7 after  discharge. Presented 04/21/2023 with acute onset facial droop and aphasia. Initial cranial CT scan showed focal area of decreased gray-white matter differentiation within the posterior left frontal lobe, concerning for acute or subacute infarct. No intracranial hemorrhage. 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 Calcified plaque in the intracranial ICAs resulting in mild to moderate stenosis bilaterally. CT cerebral perfusion scan showed a 54 cc ischemic brain identified primarily in the left MCA distribution. Admission chemistries unremarkable except creatinine 1.41, alcohol negative, urine drug screen positive cocaine, hemoglobin A1c 6.3. Patient underwent IR thrombectomy 04/21/2023 without complications per Dr. Corliss Skains. Echocardiogram ejection fraction of 60 to 65% no wall motion abnormalities. Patient was transferred to the ICU after thrombectomy and extubated. Patient remains n.p.o. with alternative means of nutritional support. He has a cortrak in place. He is frustrated by his n.p.o. status. Patient is currently on full-strength aspirin as well as IV heparin plan to change to Eliquis 5 mg twice daily after SLP determines as he can tolerate p.o. meds. Patient did have bouts of bradycardia into the 40s and Coreg was held and he does currently remain on Imdur as prior to admission.  Patient transferred to CIR on 04/28/2023 .   Patient currently requires min with mobility secondary to muscle weakness, decreased cardiorespiratoy endurance, decreased coordination, decreased attention to right, and decreased sitting balance, decreased standing balance, decreased postural control, hemiplegia, and decreased balance strategies.  Prior to hospitalization, patient was independent  with mobility and lived with Spouse, Family, Daughter  in a  House home.  Home access is 1Stairs to enter.  Patient will benefit from skilled PT intervention to maximize safe functional mobility, minimize fall risk, and decrease caregiver burden for planned discharge home with 24 hour supervision.  Anticipate patient will benefit from follow up OP at discharge.  PT - End of Session Activity Tolerance: Tolerates 30+ min activity with multiple rests Endurance Deficit: Yes PT Assessment Rehab Potential (ACUTE/IP ONLY): Good PT Plan PT Intensity: Minimum of 1-2 x/day ,45 to 90 minutes PT Frequency: 5 out of 7 days PT Duration Estimated Length of Stay: 12-14 PT Treatment/Interventions: Ambulation/gait training;Community reintegration;DME/adaptive equipment instruction;Neuromuscular re-education;Psychosocial support;Stair training;UE/LE Strength taining/ROM;Balance/vestibular training;Discharge planning;Therapeutic Activities;UE/LE Coordination activities;Cognitive remediation/compensation;Disease management/prevention;Functional mobility training;Patient/family education;Therapeutic Exercise;Visual/perceptual remediation/compensation PT Recommendation Follow Up Recommendations: 24 hour supervision/assistance;Outpatient PT Patient destination: Home Equipment Recommended: To be determined   PT Evaluation Precautions/Restrictions Precautions Precautions: Fall Precaution Comments: R inattention; SBP <180, cortrak Restrictions Weight Bearing Restrictions: No General Chart Reviewed: Yes Family/Caregiver Present: Yes Pain Interference Pain Interference Pain Effect on Sleep: 1. Rarely or not at all Pain Interference with Therapy Activities: 1. Rarely or not at all Pain Interference with Day-to-Day Activities: 1. Rarely or not at all Home Living/Prior Functioning Home Living Available Help at Discharge: Family;Available 24 hours/day Type of Home: House Home Access: Stairs to enter [employer] of Steps: 1 Entrance Stairs-Rails: None Home  Layout: Two level;Full bath on main level;Bed/bath upstairs  Lives With: Spouse;Family;Daughter Vision/Perception  Vision - History Ability to See in Adequate Light: 0 Adequate Vision - Assessment Alignment/Gaze Preference: Head turned;Gaze left Additional Comments: inattention the right Perception Preception Impairment Details: Inattention/Neglect;Spatial orientation Praxis Praxis: Impaired Praxis Impairment Details: Perseveration;Motor planning  Cognition Overall Cognitive Status: Impaired/Different from baseline Arousal/Alertness: Awake/alert Orientation Level: Oriented to person;Oriented to place;Oriented to situation (when given choices from a field of 2) Attention: Sustained;Selective Sustained Attention: Appears intact Sustained Attention Impairment: Functional basic Selective Attention: Impaired Selective Attention Impairment: Functional basic Memory: Impaired Memory Impairment: Decreased recall of new information;Decreased short term memory Awareness: Impaired Awareness Impairment: Emergent impairment Problem Solving: Impaired Problem Solving Impairment: Functional basic Safety/Judgment: Impaired Comments: inattentive to R side Sensation Sensation Light Touch: Impaired Detail Light Touch Impaired Details: Impaired RUE Proprioception: Impaired Detail Proprioception Impaired Details: Impaired RUE;Impaired RLE Coordination Gross Motor Movements are Fluid and Coordinated: No Fine Motor Movements are Fluid and Coordinated: No Finger Nose Finger Test: dysmetria Heel Shin Test: Undershoots bilaterally Motor  Motor Motor: Hemiplegia;Abnormal postural alignment and control  Trunk/Postural Assessment  Cervical Assessment Cervical Assessment: Within Functional Limits Thoracic Assessment Thoracic Assessment: Within Functional Limits Lumbar Assessment Lumbar Assessment: Within Functional Limits Postural Control Protective Responses: Delayed  Balance Balance Balance  Assessed: Yes Static Sitting Balance Static Sitting - Balance Support: Feet supported Static Sitting - Level of Assistance: 5: Stand by assistance Dynamic Sitting Balance Dynamic Sitting - Balance Support: Feet supported Dynamic Sitting - Level of Assistance: 5: Stand by assistance Static Standing Balance Static Standing - Balance Support: During functional activity Static Standing - Level of Assistance: 4: Min assist Dynamic Standing Balance Dynamic Standing - Balance Support: During functional activity Dynamic Standing - Level of Assistance: 4: Min assist Extremity Assessment  RUE Assessment RUE Assessment: Exceptions to [facility] Active Range of Motion (AROM) Comments: WFL General Strength Comments: impacted by inattention, decr sensation and 5th digit trigger finger RUE Body System: Neuro Brunstrum levels for arm and hand: Arm;Hand Brunstrum level for arm: Stage IV Movement is deviating from synergy Brunstrum level for hand: Stage IV  Movements deviating from synergies LUE Assessment LUE Assessment: Within Functional Limits RLE Assessment RLE Assessment: Exceptions to [facility] General Strength Comments: Grossly WFL but endurance impaired LLE Assessment LLE Assessment: Exceptions to [facility] General Strength Comments: Grossly WFL but endurance impaired  Care Tool Care Tool Bed Mobility Roll left and right activity   Roll left and right assist level: Supervision/Verbal cueing    Sit to lying activity   Sit to lying assist level: Supervision/Verbal cueing    Lying to sitting on side of bed activity   Lying to sitting on side of bed assist level: the ability to move from lying on the back to sitting on the side of the bed with no back support.: Minimal Assistance - Patient > 75%     Care Tool Transfers Sit to stand transfer   Sit to stand assist level: Minimal Assistance - Patient > 75%    Chair/bed transfer   Chair/bed transfer assist level: Minimal Assistance - Patient > 75%      Toilet transfer   Assist Level: Minimal Assistance - Patient > 75%    Car transfer   Car transfer assist level: Minimal Assistance - Patient > 75%      Care Tool Locomotion Ambulation   Assist level: Minimal Assistance - Patient > 75% Assistive device: No Device Max distance: 150'  Walk 10 feet activity   Assist level: Minimal Assistance - Patient > 75% Assistive device: No Device   Walk 50 feet with 2 turns activity   Assist level: Minimal Assistance - Patient > 75% Assistive device: No Device  Walk 150 feet activity   Assist level: Minimal Assistance - Patient > 75% Assistive device: No Device  Walk 10 feet on uneven surfaces activity   Assist level: Minimal Assistance - Patient > 75%    Stairs   Assist level: Minimal Assistance - Patient > 75% Stairs assistive device: 2 hand rails Max number of stairs: 12  Walk up/down 1 step activity   Walk up/down 1 step (curb) assist level: Minimal Assistance - Patient > 75% Walk up/down 1 step or curb assistive device: 2 hand rails  Walk up/down 4 steps activity   Walk up/down 4 steps assist level: Minimal Assistance - Patient > 75% Walk up/down 4 steps assistive device: 2 hand rails  Walk up/down 12 steps activity   Walk up/down 12 steps assist level: Minimal Assistance - Patient > 75% Walk up/down 12 steps assistive device: 2 hand rails  Pick up small objects from floor   Pick up small object from the floor assist level: Minimal Assistance - Patient > 75%    Wheelchair Is the patient using a wheelchair?: Yes Type of Wheelchair: Manual   Wheelchair assist level: Dependent - Patient 0% Max wheelchair distance: 150'  Wheel 50 feet with 2 turns activity   Assist Level: Dependent - Patient 0%  Wheel 150 feet activity   Assist Level: Dependent - Patient 0%    Refer to Care Plan for Long Term Goals  SHORT TERM GOAL WEEK 1 PT Short Term Goal 1 (Week 1): Pt will complete sit to stand with CGA consistently PT Short Term Goal 2  (Week 1): Pt wil l complete bed to chair with CGA consistently. PT Short Term Goal 3 (Week 1): Pt willl ambulate x150' with CGA PT Short Term Goal 4 (Week 1): Pt will complete x13 steps with BHRs and CGA.  Recommendations for other services: None   Skilled Therapeutic Intervention  Evaluation completed (see details  above and below) with education on PT POC and goals and individual treatment initiated with focus on bed mobility, balance, transfers, ambulation, car transfers, and stair training. Pt received semi reclined in bed and agrees to therapy. No complaint of pain.  Supine to sit with cues for sequencing and positioning. Pt performs sit to stand and stand step transfer to [facility] with minA and cues for body mechanics, initiation, sequencing, and positioning. WC transport to gym. Pt completes ramp navigation and car transfer with minA and cues for sequencing. Following rest break, pt stands and ambulates x200' with minA and no AD, with cues for upright gaze to improve posture and balance, and increasing stride length to decrease risk for falls. Pt then completes 6 Minute Walk test with same assistance and cues, completing 430'. Seated rest break, then pt completes x12 6" steps with bilateral handrails and cues for sequencing and attention to Rt side. WC transport back to room. Stand step to bed with minA and cues for positioning. Pt left supine with alarm intact and all needs within reach.  Mobility Bed Mobility Supine to Sit: Supervision/Verbal cueing Sit to Supine: Supervision/Verbal cueing Transfers Transfers: Sit to Stand;Stand to Sit;Stand Pivot Transfers Sit to Stand: Minimal Assistance - Patient > 75% Stand to Sit: Minimal Assistance - Patient > 75% Stand Pivot Transfers: Minimal Assistance - Patient > 75% Stand Pivot Transfer Details: Tactile cues for initiation;Verbal cues for sequencing;Verbal cues for technique;Tactile cues for sequencing Transfer (Assistive device): None Locomotion   Gait Ambulation: Yes Gait Assistance: Minimal Assistance - Patient > 75% Gait Distance (Feet): 200 Feet Assistive device: None Gait Assistance Details: Verbal cues for sequencing;Verbal cues for gait pattern;Verbal cues for technique;Tactile cues for posture;Tactile cues for sequencing Gait Gait: Yes Gait Pattern: Impaired Gait Pattern: Decreased stride length Gait velocity: decreased Stairs / Additional Locomotion Stairs: Yes Stairs Assistance: Minimal Assistance - Patient > 75% Stair Management Technique: Two rails Number of Stairs: 12 Height of Stairs: 6 Ramp: Minimal Assistance - Patient >75% Curb: Minimal Assistance - Patient >75% Wheelchair Mobility Wheelchair Mobility: No   Discharge Criteria: Patient will be discharged from PT if patient refuses treatment 3 consecutive times without medical reason, if treatment goals not met, if there is a change in medical status, if patient makes no progress towards goals or if patient is discharged from hospital.  The above assessment, treatment plan, treatment alternatives and goals were discussed and mutually agreed upon: by patient and by family  Beau Fanny, PT, DPT 04/29/2023, 4:26 PM

## 2023-04-29 NOTE — Discharge Summary (Addendum)
acute osseous abnormalities are seen. IMPRESSION: Stable cardiomegaly. Mild left lung base atelectasis. Electronically Signed   By: Narda Rutherford M.D.   On: 04/23/2023 13:40   CT HEAD WO CONTRAST ( )  Result Date: 04/22/2023 CLINICAL DATA:  Stroke follow-up EXAM: CT HEAD WITHOUT CONTRAST TECHNIQUE: Contiguous axial images were obtained from the base of the skull through the vertex without intravenous contrast. RADIATION DOSE REDUCTION: This exam was performed according to the departmental dose-optimization program which includes automated exposure control, adjustment of the mA and/or kV according to patient size and/or use of iterative reconstruction technique. COMPARISON:  CT/CTA head and neck 1 day prior FINDINGS: Brain: There is loss of gray-white differentiation in the left frontal and parietal lobes in the MCA distribution consistent with evolving acute to early subacute infarct, progressed in extent compared to the initial noncontrast head CT from 1 day prior. There is no hemorrhage or mass effect. Parenchymal volume is stable. The ventricles are stable in size. Gray-white differentiation is otherwise preserved. Patchy hypodensity throughout the remainder of the supratentorial white matter consistent with underlying chronic small-vessel ischemic change is stable. The pituitary and suprasellar region are normal. There is no solid mass lesion. Vascular: There is calcification of the bilateral carotid siphons. Skull: Normal. Negative for fracture or focal lesion. Sinuses/Orbits: The paranasal sinuses are clear. The globes and orbits are unremarkable. Other: The mastoid air cells and middle ear cavities are clear. 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 Electronically Signed   By: Lesia Hausen M.D.   On: 04/22/2023 20:13   ECHOCARDIOGRAM COMPLETE  Result Date: 04/22/2023    ECHOCARDIOGRAM REPORT   Patient Name:   Timothy [name] Date of Exam: 04/22/2023 Medical Rec #:  [id number]         Height:       67.0 in Accession #:    [id number]        Weight:       151.0 lb Date of Birth:  11-10-1955          BSA:          1.795 m Patient Age:    67 years          BP:           151/79 mmHg Patient Gender: M                 HR:           64 bpm. Exam Location:  Inpatient Procedure: 2D Echo, Cardiac Doppler and Color Doppler Indications:    Stroke 434.91 / I163.9  History:        Patient has prior history of Echocardiogram examinations, most                 recent 11/24/2021. CHF, Arrythmias:Atrial Fibrillation; Risk                 Factors:Dyslipidemia and Hypertension.  Sonographer:    Harriette Bouillon RDCS Referring Phys: MCNEILL P KIRKPATRICK IMPRESSIONS  1. No significant LVOT gradient. Left ventricular ejection fraction, by estimation, is 60 to 65%. The left ventricle has normal function. The left ventricle has no regional wall motion abnormalities. There is severe asymmetric left ventricular hypertrophy of the basal-septal segment. Left ventricular diastolic parameters are indeterminate.  2. Right ventricular systolic function  Value Date/Time   CHOL 165 04/22/2023 0726   TRIG 107 04/22/2023 0726   HDL 53 04/22/2023 0726   CHOLHDL 3.1 04/22/2023 0726   VLDL 21 04/22/2023 0726   LDLCALC 91 04/22/2023 0726   HgbA1C  Lab Results  Component Value Date   HGBA1C 6.3 (H) 04/21/2023   Urine Drug Screen cocaine positive Alcohol Level    Component Value Date/Time   ETH <10 04/21/2023 1140     SIGNIFICANT DIAGNOSTIC STUDIES DG Swallowing Func-Speech Pathology  Result Date: 04/26/2023 Table formatting from the original  result was not included. Modified Barium Swallow Study Patient Details Name: Timothy [name] MRN: [id number] Date of Birth: [birth year] Today's Date: 04/26/2023 HPI/PMH: HPI: Patient is 67 y.o. 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 Pt s/p revascularization of the of the superior branch of the right MCA with mechanical thrombectomy achieving TICI 2C revascularization. PMH: ETOH abuse, CHF, CKD, HTN. Clinical Impression: Clinical Impression: Pt's oropharyngeal dysphagia includes timing, motor, and sensory components. He has reduced lingual control with disorganized posterior transit although minimal oral residue. There is anterior loss out of the R side of his mouth though. He has reduced hyolaryngeal movement and laryngeal vestibule closure, and when swallow is mistimed, he penetrates during the swallow across consistencies. Penetration also occurred occasionally from intermittent vallecular residue. Pt's volitional cough is weak and ineffective at clearing penetrates, which continue to fall over time until aspiration occurs. Aspiration for the most part is silent, although spontaneous coughing is also not effective. Pt has a hard time follow commands consistently enough to use strategies, but a chin tuck also did not improve airway protection. Pt is not yet ready for PO diet. Would continue Cortrak and consider therapeutic trials of boluses with SLP. Factors that may increase risk of adverse event in presence of aspiration [name] & [name] 2021): Factors that may increase risk of adverse event in presence of aspiration [name] & [name] 2021): Aspiration of thick, dense, and/or acidic materials; Weak cough; Presence of tubes (ETT, trach, NG, etc.); Dependence for feeding and/or oral hygiene; Limited mobility; Reduced saliva Recommendations/Plan: Swallowing Evaluation Recommendations Swallowing Evaluation Recommendations Recommendations: NPO Medication  Administration: Via alternative means Oral care recommendations: Oral care QID (4x/day) Caregiver Recommendations: Have oral suction available Treatment Plan Treatment Plan Treatment recommendations: Therapy as outlined in treatment plan below Follow-up recommendations: Acute inpatient rehab (3 hours/day) Functional status assessment: Patient has had a recent decline in their functional status and demonstrates the ability to make significant improvements in function in a reasonable and predictable amount of time. Treatment frequency: Min 2x/week Treatment duration: 2 weeks Interventions: Aspiration precaution training; Compensatory techniques; Patient/family education; Trials of upgraded texture/liquids; Diet toleration management by SLP Recommendations Recommendations for follow up therapy are one component of a multi-disciplinary discharge planning process, led by the attending physician.  Recommendations may be updated based on patient status, additional functional criteria and insurance authorization. Assessment: Orofacial Exam: Orofacial Exam Oral Cavity: Oral Hygiene: WFL Oral Cavity - Dentition: Poor condition; Missing dentition Orofacial Anatomy: WFL Anatomy: Anatomy: WFL Boluses Administered: Boluses Administered Boluses Administered: Thin liquids (Level 0); Mildly thick liquids (Level 2, nectar thick); Moderately thick liquids (Level 3, honey thick); Puree  Oral Impairment Domain: Oral Impairment Domain Lip Closure: Escape beyond mid-chin Tongue control during bolus hold: Posterior escape of less than half of bolus Bolus preparation/mastication: -- (solid deferred) Bolus transport/lingual motion: Repetitive/disorganized tongue motion Oral residue: Trace residue lining oral  Value Date/Time   CHOL 165 04/22/2023 0726   TRIG 107 04/22/2023 0726   HDL 53 04/22/2023 0726   CHOLHDL 3.1 04/22/2023 0726   VLDL 21 04/22/2023 0726   LDLCALC 91 04/22/2023 0726   HgbA1C  Lab Results  Component Value Date   HGBA1C 6.3 (H) 04/21/2023   Urine Drug Screen cocaine positive Alcohol Level    Component Value Date/Time   ETH <10 04/21/2023 1140     SIGNIFICANT DIAGNOSTIC STUDIES DG Swallowing Func-Speech Pathology  Result Date: 04/26/2023 Table formatting from the original  result was not included. Modified Barium Swallow Study Patient Details Name: Timothy [name] MRN: [id number] Date of Birth: [birth year] Today's Date: 04/26/2023 HPI/PMH: HPI: Patient is 67 y.o. 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 Pt s/p revascularization of the of the superior branch of the right MCA with mechanical thrombectomy achieving TICI 2C revascularization. PMH: ETOH abuse, CHF, CKD, HTN. Clinical Impression: Clinical Impression: Pt's oropharyngeal dysphagia includes timing, motor, and sensory components. He has reduced lingual control with disorganized posterior transit although minimal oral residue. There is anterior loss out of the R side of his mouth though. He has reduced hyolaryngeal movement and laryngeal vestibule closure, and when swallow is mistimed, he penetrates during the swallow across consistencies. Penetration also occurred occasionally from intermittent vallecular residue. Pt's volitional cough is weak and ineffective at clearing penetrates, which continue to fall over time until aspiration occurs. Aspiration for the most part is silent, although spontaneous coughing is also not effective. Pt has a hard time follow commands consistently enough to use strategies, but a chin tuck also did not improve airway protection. Pt is not yet ready for PO diet. Would continue Cortrak and consider therapeutic trials of boluses with SLP. Factors that may increase risk of adverse event in presence of aspiration [name] & [name] 2021): Factors that may increase risk of adverse event in presence of aspiration [name] & [name] 2021): Aspiration of thick, dense, and/or acidic materials; Weak cough; Presence of tubes (ETT, trach, NG, etc.); Dependence for feeding and/or oral hygiene; Limited mobility; Reduced saliva Recommendations/Plan: Swallowing Evaluation Recommendations Swallowing Evaluation Recommendations Recommendations: NPO Medication  Administration: Via alternative means Oral care recommendations: Oral care QID (4x/day) Caregiver Recommendations: Have oral suction available Treatment Plan Treatment Plan Treatment recommendations: Therapy as outlined in treatment plan below Follow-up recommendations: Acute inpatient rehab (3 hours/day) Functional status assessment: Patient has had a recent decline in their functional status and demonstrates the ability to make significant improvements in function in a reasonable and predictable amount of time. Treatment frequency: Min 2x/week Treatment duration: 2 weeks Interventions: Aspiration precaution training; Compensatory techniques; Patient/family education; Trials of upgraded texture/liquids; Diet toleration management by SLP Recommendations Recommendations for follow up therapy are one component of a multi-disciplinary discharge planning process, led by the attending physician.  Recommendations may be updated based on patient status, additional functional criteria and insurance authorization. Assessment: Orofacial Exam: Orofacial Exam Oral Cavity: Oral Hygiene: WFL Oral Cavity - Dentition: Poor condition; Missing dentition Orofacial Anatomy: WFL Anatomy: Anatomy: WFL Boluses Administered: Boluses Administered Boluses Administered: Thin liquids (Level 0); Mildly thick liquids (Level 2, nectar thick); Moderately thick liquids (Level 3, honey thick); Puree  Oral Impairment Domain: Oral Impairment Domain Lip Closure: Escape beyond mid-chin Tongue control during bolus hold: Posterior escape of less than half of bolus Bolus preparation/mastication: -- (solid deferred) Bolus transport/lingual motion: Repetitive/disorganized tongue motion Oral residue: Trace residue lining oral  Value Date/Time   CHOL 165 04/22/2023 0726   TRIG 107 04/22/2023 0726   HDL 53 04/22/2023 0726   CHOLHDL 3.1 04/22/2023 0726   VLDL 21 04/22/2023 0726   LDLCALC 91 04/22/2023 0726   HgbA1C  Lab Results  Component Value Date   HGBA1C 6.3 (H) 04/21/2023   Urine Drug Screen cocaine positive Alcohol Level    Component Value Date/Time   ETH <10 04/21/2023 1140     SIGNIFICANT DIAGNOSTIC STUDIES DG Swallowing Func-Speech Pathology  Result Date: 04/26/2023 Table formatting from the original  result was not included. Modified Barium Swallow Study Patient Details Name: Timothy [name] MRN: [id number] Date of Birth: [birth year] Today's Date: 04/26/2023 HPI/PMH: HPI: Patient is 67 y.o. 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 Pt s/p revascularization of the of the superior branch of the right MCA with mechanical thrombectomy achieving TICI 2C revascularization. PMH: ETOH abuse, CHF, CKD, HTN. Clinical Impression: Clinical Impression: Pt's oropharyngeal dysphagia includes timing, motor, and sensory components. He has reduced lingual control with disorganized posterior transit although minimal oral residue. There is anterior loss out of the R side of his mouth though. He has reduced hyolaryngeal movement and laryngeal vestibule closure, and when swallow is mistimed, he penetrates during the swallow across consistencies. Penetration also occurred occasionally from intermittent vallecular residue. Pt's volitional cough is weak and ineffective at clearing penetrates, which continue to fall over time until aspiration occurs. Aspiration for the most part is silent, although spontaneous coughing is also not effective. Pt has a hard time follow commands consistently enough to use strategies, but a chin tuck also did not improve airway protection. Pt is not yet ready for PO diet. Would continue Cortrak and consider therapeutic trials of boluses with SLP. Factors that may increase risk of adverse event in presence of aspiration [name] & [name] 2021): Factors that may increase risk of adverse event in presence of aspiration [name] & [name] 2021): Aspiration of thick, dense, and/or acidic materials; Weak cough; Presence of tubes (ETT, trach, NG, etc.); Dependence for feeding and/or oral hygiene; Limited mobility; Reduced saliva Recommendations/Plan: Swallowing Evaluation Recommendations Swallowing Evaluation Recommendations Recommendations: NPO Medication  Administration: Via alternative means Oral care recommendations: Oral care QID (4x/day) Caregiver Recommendations: Have oral suction available Treatment Plan Treatment Plan Treatment recommendations: Therapy as outlined in treatment plan below Follow-up recommendations: Acute inpatient rehab (3 hours/day) Functional status assessment: Patient has had a recent decline in their functional status and demonstrates the ability to make significant improvements in function in a reasonable and predictable amount of time. Treatment frequency: Min 2x/week Treatment duration: 2 weeks Interventions: Aspiration precaution training; Compensatory techniques; Patient/family education; Trials of upgraded texture/liquids; Diet toleration management by SLP Recommendations Recommendations for follow up therapy are one component of a multi-disciplinary discharge planning process, led by the attending physician.  Recommendations may be updated based on patient status, additional functional criteria and insurance authorization. Assessment: Orofacial Exam: Orofacial Exam Oral Cavity: Oral Hygiene: WFL Oral Cavity - Dentition: Poor condition; Missing dentition Orofacial Anatomy: WFL Anatomy: Anatomy: WFL Boluses Administered: Boluses Administered Boluses Administered: Thin liquids (Level 0); Mildly thick liquids (Level 2, nectar thick); Moderately thick liquids (Level 3, honey thick); Puree  Oral Impairment Domain: Oral Impairment Domain Lip Closure: Escape beyond mid-chin Tongue control during bolus hold: Posterior escape of less than half of bolus Bolus preparation/mastication: -- (solid deferred) Bolus transport/lingual motion: Repetitive/disorganized tongue motion Oral residue: Trace residue lining oral  acute osseous abnormalities are seen. IMPRESSION: Stable cardiomegaly. Mild left lung base atelectasis. Electronically Signed   By: Narda Rutherford M.D.   On: 04/23/2023 13:40   CT HEAD WO CONTRAST ( )  Result Date: 04/22/2023 CLINICAL DATA:  Stroke follow-up EXAM: CT HEAD WITHOUT CONTRAST TECHNIQUE: Contiguous axial images were obtained from the base of the skull through the vertex without intravenous contrast. RADIATION DOSE REDUCTION: This exam was performed according to the departmental dose-optimization program which includes automated exposure control, adjustment of the mA and/or kV according to patient size and/or use of iterative reconstruction technique. COMPARISON:  CT/CTA head and neck 1 day prior FINDINGS: Brain: There is loss of gray-white differentiation in the left frontal and parietal lobes in the MCA distribution consistent with evolving acute to early subacute infarct, progressed in extent compared to the initial noncontrast head CT from 1 day prior. There is no hemorrhage or mass effect. Parenchymal volume is stable. The ventricles are stable in size. Gray-white differentiation is otherwise preserved. Patchy hypodensity throughout the remainder of the supratentorial white matter consistent with underlying chronic small-vessel ischemic change is stable. The pituitary and suprasellar region are normal. There is no solid mass lesion. Vascular: There is calcification of the bilateral carotid siphons. Skull: Normal. Negative for fracture or focal lesion. Sinuses/Orbits: The paranasal sinuses are clear. The globes and orbits are unremarkable. Other: The mastoid air cells and middle ear cavities are clear. 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 Electronically Signed   By: Lesia Hausen M.D.   On: 04/22/2023 20:13   ECHOCARDIOGRAM COMPLETE  Result Date: 04/22/2023    ECHOCARDIOGRAM REPORT   Patient Name:   Timothy [name] Date of Exam: 04/22/2023 Medical Rec #:  [id number]         Height:       67.0 in Accession #:    [id number]        Weight:       151.0 lb Date of Birth:  11-10-1955          BSA:          1.795 m Patient Age:    67 years          BP:           151/79 mmHg Patient Gender: M                 HR:           64 bpm. Exam Location:  Inpatient Procedure: 2D Echo, Cardiac Doppler and Color Doppler Indications:    Stroke 434.91 / I163.9  History:        Patient has prior history of Echocardiogram examinations, most                 recent 11/24/2021. CHF, Arrythmias:Atrial Fibrillation; Risk                 Factors:Dyslipidemia and Hypertension.  Sonographer:    Harriette Bouillon RDCS Referring Phys: MCNEILL P KIRKPATRICK IMPRESSIONS  1. No significant LVOT gradient. Left ventricular ejection fraction, by estimation, is 60 to 65%. The left ventricle has normal function. The left ventricle has no regional wall motion abnormalities. There is severe asymmetric left ventricular hypertrophy of the basal-septal segment. Left ventricular diastolic parameters are indeterminate.  2. Right ventricular systolic function  acute osseous abnormalities are seen. IMPRESSION: Stable cardiomegaly. Mild left lung base atelectasis. Electronically Signed   By: Narda Rutherford M.D.   On: 04/23/2023 13:40   CT HEAD WO CONTRAST ( )  Result Date: 04/22/2023 CLINICAL DATA:  Stroke follow-up EXAM: CT HEAD WITHOUT CONTRAST TECHNIQUE: Contiguous axial images were obtained from the base of the skull through the vertex without intravenous contrast. RADIATION DOSE REDUCTION: This exam was performed according to the departmental dose-optimization program which includes automated exposure control, adjustment of the mA and/or kV according to patient size and/or use of iterative reconstruction technique. COMPARISON:  CT/CTA head and neck 1 day prior FINDINGS: Brain: There is loss of gray-white differentiation in the left frontal and parietal lobes in the MCA distribution consistent with evolving acute to early subacute infarct, progressed in extent compared to the initial noncontrast head CT from 1 day prior. There is no hemorrhage or mass effect. Parenchymal volume is stable. The ventricles are stable in size. Gray-white differentiation is otherwise preserved. Patchy hypodensity throughout the remainder of the supratentorial white matter consistent with underlying chronic small-vessel ischemic change is stable. The pituitary and suprasellar region are normal. There is no solid mass lesion. Vascular: There is calcification of the bilateral carotid siphons. Skull: Normal. Negative for fracture or focal lesion. Sinuses/Orbits: The paranasal sinuses are clear. The globes and orbits are unremarkable. Other: The mastoid air cells and middle ear cavities are clear. 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 Electronically Signed   By: Lesia Hausen M.D.   On: 04/22/2023 20:13   ECHOCARDIOGRAM COMPLETE  Result Date: 04/22/2023    ECHOCARDIOGRAM REPORT   Patient Name:   Timothy [name] Date of Exam: 04/22/2023 Medical Rec #:  [id number]         Height:       67.0 in Accession #:    [id number]        Weight:       151.0 lb Date of Birth:  11-10-1955          BSA:          1.795 m Patient Age:    67 years          BP:           151/79 mmHg Patient Gender: M                 HR:           64 bpm. Exam Location:  Inpatient Procedure: 2D Echo, Cardiac Doppler and Color Doppler Indications:    Stroke 434.91 / I163.9  History:        Patient has prior history of Echocardiogram examinations, most                 recent 11/24/2021. CHF, Arrythmias:Atrial Fibrillation; Risk                 Factors:Dyslipidemia and Hypertension.  Sonographer:    Harriette Bouillon RDCS Referring Phys: MCNEILL P KIRKPATRICK IMPRESSIONS  1. No significant LVOT gradient. Left ventricular ejection fraction, by estimation, is 60 to 65%. The left ventricle has normal function. The left ventricle has no regional wall motion abnormalities. There is severe asymmetric left ventricular hypertrophy of the basal-septal segment. Left ventricular diastolic parameters are indeterminate.  2. Right ventricular systolic function  acute osseous abnormalities are seen. IMPRESSION: Stable cardiomegaly. Mild left lung base atelectasis. Electronically Signed   By: Narda Rutherford M.D.   On: 04/23/2023 13:40   CT HEAD WO CONTRAST ( )  Result Date: 04/22/2023 CLINICAL DATA:  Stroke follow-up EXAM: CT HEAD WITHOUT CONTRAST TECHNIQUE: Contiguous axial images were obtained from the base of the skull through the vertex without intravenous contrast. RADIATION DOSE REDUCTION: This exam was performed according to the departmental dose-optimization program which includes automated exposure control, adjustment of the mA and/or kV according to patient size and/or use of iterative reconstruction technique. COMPARISON:  CT/CTA head and neck 1 day prior FINDINGS: Brain: There is loss of gray-white differentiation in the left frontal and parietal lobes in the MCA distribution consistent with evolving acute to early subacute infarct, progressed in extent compared to the initial noncontrast head CT from 1 day prior. There is no hemorrhage or mass effect. Parenchymal volume is stable. The ventricles are stable in size. Gray-white differentiation is otherwise preserved. Patchy hypodensity throughout the remainder of the supratentorial white matter consistent with underlying chronic small-vessel ischemic change is stable. The pituitary and suprasellar region are normal. There is no solid mass lesion. Vascular: There is calcification of the bilateral carotid siphons. Skull: Normal. Negative for fracture or focal lesion. Sinuses/Orbits: The paranasal sinuses are clear. The globes and orbits are unremarkable. Other: The mastoid air cells and middle ear cavities are clear. 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 Electronically Signed   By: Lesia Hausen M.D.   On: 04/22/2023 20:13   ECHOCARDIOGRAM COMPLETE  Result Date: 04/22/2023    ECHOCARDIOGRAM REPORT   Patient Name:   Timothy [name] Date of Exam: 04/22/2023 Medical Rec #:  [id number]         Height:       67.0 in Accession #:    [id number]        Weight:       151.0 lb Date of Birth:  11-10-1955          BSA:          1.795 m Patient Age:    67 years          BP:           151/79 mmHg Patient Gender: M                 HR:           64 bpm. Exam Location:  Inpatient Procedure: 2D Echo, Cardiac Doppler and Color Doppler Indications:    Stroke 434.91 / I163.9  History:        Patient has prior history of Echocardiogram examinations, most                 recent 11/24/2021. CHF, Arrythmias:Atrial Fibrillation; Risk                 Factors:Dyslipidemia and Hypertension.  Sonographer:    Harriette Bouillon RDCS Referring Phys: MCNEILL P KIRKPATRICK IMPRESSIONS  1. No significant LVOT gradient. Left ventricular ejection fraction, by estimation, is 60 to 65%. The left ventricle has normal function. The left ventricle has no regional wall motion abnormalities. There is severe asymmetric left ventricular hypertrophy of the basal-septal segment. Left ventricular diastolic parameters are indeterminate.  2. Right ventricular systolic function  Value Date/Time   CHOL 165 04/22/2023 0726   TRIG 107 04/22/2023 0726   HDL 53 04/22/2023 0726   CHOLHDL 3.1 04/22/2023 0726   VLDL 21 04/22/2023 0726   LDLCALC 91 04/22/2023 0726   HgbA1C  Lab Results  Component Value Date   HGBA1C 6.3 (H) 04/21/2023   Urine Drug Screen cocaine positive Alcohol Level    Component Value Date/Time   ETH <10 04/21/2023 1140     SIGNIFICANT DIAGNOSTIC STUDIES DG Swallowing Func-Speech Pathology  Result Date: 04/26/2023 Table formatting from the original  result was not included. Modified Barium Swallow Study Patient Details Name: Timothy [name] MRN: [id number] Date of Birth: [birth year] Today's Date: 04/26/2023 HPI/PMH: HPI: Patient is 67 y.o. 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 Pt s/p revascularization of the of the superior branch of the right MCA with mechanical thrombectomy achieving TICI 2C revascularization. PMH: ETOH abuse, CHF, CKD, HTN. Clinical Impression: Clinical Impression: Pt's oropharyngeal dysphagia includes timing, motor, and sensory components. He has reduced lingual control with disorganized posterior transit although minimal oral residue. There is anterior loss out of the R side of his mouth though. He has reduced hyolaryngeal movement and laryngeal vestibule closure, and when swallow is mistimed, he penetrates during the swallow across consistencies. Penetration also occurred occasionally from intermittent vallecular residue. Pt's volitional cough is weak and ineffective at clearing penetrates, which continue to fall over time until aspiration occurs. Aspiration for the most part is silent, although spontaneous coughing is also not effective. Pt has a hard time follow commands consistently enough to use strategies, but a chin tuck also did not improve airway protection. Pt is not yet ready for PO diet. Would continue Cortrak and consider therapeutic trials of boluses with SLP. Factors that may increase risk of adverse event in presence of aspiration [name] & [name] 2021): Factors that may increase risk of adverse event in presence of aspiration [name] & [name] 2021): Aspiration of thick, dense, and/or acidic materials; Weak cough; Presence of tubes (ETT, trach, NG, etc.); Dependence for feeding and/or oral hygiene; Limited mobility; Reduced saliva Recommendations/Plan: Swallowing Evaluation Recommendations Swallowing Evaluation Recommendations Recommendations: NPO Medication  Administration: Via alternative means Oral care recommendations: Oral care QID (4x/day) Caregiver Recommendations: Have oral suction available Treatment Plan Treatment Plan Treatment recommendations: Therapy as outlined in treatment plan below Follow-up recommendations: Acute inpatient rehab (3 hours/day) Functional status assessment: Patient has had a recent decline in their functional status and demonstrates the ability to make significant improvements in function in a reasonable and predictable amount of time. Treatment frequency: Min 2x/week Treatment duration: 2 weeks Interventions: Aspiration precaution training; Compensatory techniques; Patient/family education; Trials of upgraded texture/liquids; Diet toleration management by SLP Recommendations Recommendations for follow up therapy are one component of a multi-disciplinary discharge planning process, led by the attending physician.  Recommendations may be updated based on patient status, additional functional criteria and insurance authorization. Assessment: Orofacial Exam: Orofacial Exam Oral Cavity: Oral Hygiene: WFL Oral Cavity - Dentition: Poor condition; Missing dentition Orofacial Anatomy: WFL Anatomy: Anatomy: WFL Boluses Administered: Boluses Administered Boluses Administered: Thin liquids (Level 0); Mildly thick liquids (Level 2, nectar thick); Moderately thick liquids (Level 3, honey thick); Puree  Oral Impairment Domain: Oral Impairment Domain Lip Closure: Escape beyond mid-chin Tongue control during bolus hold: Posterior escape of less than half of bolus Bolus preparation/mastication: -- (solid deferred) Bolus transport/lingual motion: Repetitive/disorganized tongue motion Oral residue: Trace residue lining oral  acute osseous abnormalities are seen. IMPRESSION: Stable cardiomegaly. Mild left lung base atelectasis. Electronically Signed   By: Narda Rutherford M.D.   On: 04/23/2023 13:40   CT HEAD WO CONTRAST ( )  Result Date: 04/22/2023 CLINICAL DATA:  Stroke follow-up EXAM: CT HEAD WITHOUT CONTRAST TECHNIQUE: Contiguous axial images were obtained from the base of the skull through the vertex without intravenous contrast. RADIATION DOSE REDUCTION: This exam was performed according to the departmental dose-optimization program which includes automated exposure control, adjustment of the mA and/or kV according to patient size and/or use of iterative reconstruction technique. COMPARISON:  CT/CTA head and neck 1 day prior FINDINGS: Brain: There is loss of gray-white differentiation in the left frontal and parietal lobes in the MCA distribution consistent with evolving acute to early subacute infarct, progressed in extent compared to the initial noncontrast head CT from 1 day prior. There is no hemorrhage or mass effect. Parenchymal volume is stable. The ventricles are stable in size. Gray-white differentiation is otherwise preserved. Patchy hypodensity throughout the remainder of the supratentorial white matter consistent with underlying chronic small-vessel ischemic change is stable. The pituitary and suprasellar region are normal. There is no solid mass lesion. Vascular: There is calcification of the bilateral carotid siphons. Skull: Normal. Negative for fracture or focal lesion. Sinuses/Orbits: The paranasal sinuses are clear. The globes and orbits are unremarkable. Other: The mastoid air cells and middle ear cavities are clear. 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 Electronically Signed   By: Lesia Hausen M.D.   On: 04/22/2023 20:13   ECHOCARDIOGRAM COMPLETE  Result Date: 04/22/2023    ECHOCARDIOGRAM REPORT   Patient Name:   Timothy [name] Date of Exam: 04/22/2023 Medical Rec #:  [id number]         Height:       67.0 in Accession #:    [id number]        Weight:       151.0 lb Date of Birth:  11-10-1955          BSA:          1.795 m Patient Age:    67 years          BP:           151/79 mmHg Patient Gender: M                 HR:           64 bpm. Exam Location:  Inpatient Procedure: 2D Echo, Cardiac Doppler and Color Doppler Indications:    Stroke 434.91 / I163.9  History:        Patient has prior history of Echocardiogram examinations, most                 recent 11/24/2021. CHF, Arrythmias:Atrial Fibrillation; Risk                 Factors:Dyslipidemia and Hypertension.  Sonographer:    Harriette Bouillon RDCS Referring Phys: MCNEILL P KIRKPATRICK IMPRESSIONS  1. No significant LVOT gradient. Left ventricular ejection fraction, by estimation, is 60 to 65%. The left ventricle has normal function. The left ventricle has no regional wall motion abnormalities. There is severe asymmetric left ventricular hypertrophy of the basal-septal segment. Left ventricular diastolic parameters are indeterminate.  2. Right ventricular systolic function  Value Date/Time   CHOL 165 04/22/2023 0726   TRIG 107 04/22/2023 0726   HDL 53 04/22/2023 0726   CHOLHDL 3.1 04/22/2023 0726   VLDL 21 04/22/2023 0726   LDLCALC 91 04/22/2023 0726   HgbA1C  Lab Results  Component Value Date   HGBA1C 6.3 (H) 04/21/2023   Urine Drug Screen cocaine positive Alcohol Level    Component Value Date/Time   ETH <10 04/21/2023 1140     SIGNIFICANT DIAGNOSTIC STUDIES DG Swallowing Func-Speech Pathology  Result Date: 04/26/2023 Table formatting from the original  result was not included. Modified Barium Swallow Study Patient Details Name: Timothy [name] MRN: [id number] Date of Birth: [birth year] Today's Date: 04/26/2023 HPI/PMH: HPI: Patient is 67 y.o. 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 Pt s/p revascularization of the of the superior branch of the right MCA with mechanical thrombectomy achieving TICI 2C revascularization. PMH: ETOH abuse, CHF, CKD, HTN. Clinical Impression: Clinical Impression: Pt's oropharyngeal dysphagia includes timing, motor, and sensory components. He has reduced lingual control with disorganized posterior transit although minimal oral residue. There is anterior loss out of the R side of his mouth though. He has reduced hyolaryngeal movement and laryngeal vestibule closure, and when swallow is mistimed, he penetrates during the swallow across consistencies. Penetration also occurred occasionally from intermittent vallecular residue. Pt's volitional cough is weak and ineffective at clearing penetrates, which continue to fall over time until aspiration occurs. Aspiration for the most part is silent, although spontaneous coughing is also not effective. Pt has a hard time follow commands consistently enough to use strategies, but a chin tuck also did not improve airway protection. Pt is not yet ready for PO diet. Would continue Cortrak and consider therapeutic trials of boluses with SLP. Factors that may increase risk of adverse event in presence of aspiration [name] & [name] 2021): Factors that may increase risk of adverse event in presence of aspiration [name] & [name] 2021): Aspiration of thick, dense, and/or acidic materials; Weak cough; Presence of tubes (ETT, trach, NG, etc.); Dependence for feeding and/or oral hygiene; Limited mobility; Reduced saliva Recommendations/Plan: Swallowing Evaluation Recommendations Swallowing Evaluation Recommendations Recommendations: NPO Medication  Administration: Via alternative means Oral care recommendations: Oral care QID (4x/day) Caregiver Recommendations: Have oral suction available Treatment Plan Treatment Plan Treatment recommendations: Therapy as outlined in treatment plan below Follow-up recommendations: Acute inpatient rehab (3 hours/day) Functional status assessment: Patient has had a recent decline in their functional status and demonstrates the ability to make significant improvements in function in a reasonable and predictable amount of time. Treatment frequency: Min 2x/week Treatment duration: 2 weeks Interventions: Aspiration precaution training; Compensatory techniques; Patient/family education; Trials of upgraded texture/liquids; Diet toleration management by SLP Recommendations Recommendations for follow up therapy are one component of a multi-disciplinary discharge planning process, led by the attending physician.  Recommendations may be updated based on patient status, additional functional criteria and insurance authorization. Assessment: Orofacial Exam: Orofacial Exam Oral Cavity: Oral Hygiene: WFL Oral Cavity - Dentition: Poor condition; Missing dentition Orofacial Anatomy: WFL Anatomy: Anatomy: WFL Boluses Administered: Boluses Administered Boluses Administered: Thin liquids (Level 0); Mildly thick liquids (Level 2, nectar thick); Moderately thick liquids (Level 3, honey thick); Puree  Oral Impairment Domain: Oral Impairment Domain Lip Closure: Escape beyond mid-chin Tongue control during bolus hold: Posterior escape of less than half of bolus Bolus preparation/mastication: -- (solid deferred) Bolus transport/lingual motion: Repetitive/disorganized tongue motion Oral residue: Trace residue lining oral  acute osseous abnormalities are seen. IMPRESSION: Stable cardiomegaly. Mild left lung base atelectasis. Electronically Signed   By: Narda Rutherford M.D.   On: 04/23/2023 13:40   CT HEAD WO CONTRAST ( )  Result Date: 04/22/2023 CLINICAL DATA:  Stroke follow-up EXAM: CT HEAD WITHOUT CONTRAST TECHNIQUE: Contiguous axial images were obtained from the base of the skull through the vertex without intravenous contrast. RADIATION DOSE REDUCTION: This exam was performed according to the departmental dose-optimization program which includes automated exposure control, adjustment of the mA and/or kV according to patient size and/or use of iterative reconstruction technique. COMPARISON:  CT/CTA head and neck 1 day prior FINDINGS: Brain: There is loss of gray-white differentiation in the left frontal and parietal lobes in the MCA distribution consistent with evolving acute to early subacute infarct, progressed in extent compared to the initial noncontrast head CT from 1 day prior. There is no hemorrhage or mass effect. Parenchymal volume is stable. The ventricles are stable in size. Gray-white differentiation is otherwise preserved. Patchy hypodensity throughout the remainder of the supratentorial white matter consistent with underlying chronic small-vessel ischemic change is stable. The pituitary and suprasellar region are normal. There is no solid mass lesion. Vascular: There is calcification of the bilateral carotid siphons. Skull: Normal. Negative for fracture or focal lesion. Sinuses/Orbits: The paranasal sinuses are clear. The globes and orbits are unremarkable. Other: The mastoid air cells and middle ear cavities are clear. 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 Electronically Signed   By: Lesia Hausen M.D.   On: 04/22/2023 20:13   ECHOCARDIOGRAM COMPLETE  Result Date: 04/22/2023    ECHOCARDIOGRAM REPORT   Patient Name:   Timothy [name] Date of Exam: 04/22/2023 Medical Rec #:  [id number]         Height:       67.0 in Accession #:    [id number]        Weight:       151.0 lb Date of Birth:  11-10-1955          BSA:          1.795 m Patient Age:    67 years          BP:           151/79 mmHg Patient Gender: M                 HR:           64 bpm. Exam Location:  Inpatient Procedure: 2D Echo, Cardiac Doppler and Color Doppler Indications:    Stroke 434.91 / I163.9  History:        Patient has prior history of Echocardiogram examinations, most                 recent 11/24/2021. CHF, Arrythmias:Atrial Fibrillation; Risk                 Factors:Dyslipidemia and Hypertension.  Sonographer:    Harriette Bouillon RDCS Referring Phys: MCNEILL P KIRKPATRICK IMPRESSIONS  1. No significant LVOT gradient. Left ventricular ejection fraction, by estimation, is 60 to 65%. The left ventricle has normal function. The left ventricle has no regional wall motion abnormalities. There is severe asymmetric left ventricular hypertrophy of the basal-septal segment. Left ventricular diastolic parameters are indeterminate.  2. Right ventricular systolic function  acute osseous abnormalities are seen. IMPRESSION: Stable cardiomegaly. Mild left lung base atelectasis. Electronically Signed   By: Narda Rutherford M.D.   On: 04/23/2023 13:40   CT HEAD WO CONTRAST ( )  Result Date: 04/22/2023 CLINICAL DATA:  Stroke follow-up EXAM: CT HEAD WITHOUT CONTRAST TECHNIQUE: Contiguous axial images were obtained from the base of the skull through the vertex without intravenous contrast. RADIATION DOSE REDUCTION: This exam was performed according to the departmental dose-optimization program which includes automated exposure control, adjustment of the mA and/or kV according to patient size and/or use of iterative reconstruction technique. COMPARISON:  CT/CTA head and neck 1 day prior FINDINGS: Brain: There is loss of gray-white differentiation in the left frontal and parietal lobes in the MCA distribution consistent with evolving acute to early subacute infarct, progressed in extent compared to the initial noncontrast head CT from 1 day prior. There is no hemorrhage or mass effect. Parenchymal volume is stable. The ventricles are stable in size. Gray-white differentiation is otherwise preserved. Patchy hypodensity throughout the remainder of the supratentorial white matter consistent with underlying chronic small-vessel ischemic change is stable. The pituitary and suprasellar region are normal. There is no solid mass lesion. Vascular: There is calcification of the bilateral carotid siphons. Skull: Normal. Negative for fracture or focal lesion. Sinuses/Orbits: The paranasal sinuses are clear. The globes and orbits are unremarkable. Other: The mastoid air cells and middle ear cavities are clear. 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 Electronically Signed   By: Lesia Hausen M.D.   On: 04/22/2023 20:13   ECHOCARDIOGRAM COMPLETE  Result Date: 04/22/2023    ECHOCARDIOGRAM REPORT   Patient Name:   Timothy [name] Date of Exam: 04/22/2023 Medical Rec #:  [id number]         Height:       67.0 in Accession #:    [id number]        Weight:       151.0 lb Date of Birth:  11-10-1955          BSA:          1.795 m Patient Age:    67 years          BP:           151/79 mmHg Patient Gender: M                 HR:           64 bpm. Exam Location:  Inpatient Procedure: 2D Echo, Cardiac Doppler and Color Doppler Indications:    Stroke 434.91 / I163.9  History:        Patient has prior history of Echocardiogram examinations, most                 recent 11/24/2021. CHF, Arrythmias:Atrial Fibrillation; Risk                 Factors:Dyslipidemia and Hypertension.  Sonographer:    Harriette Bouillon RDCS Referring Phys: MCNEILL P KIRKPATRICK IMPRESSIONS  1. No significant LVOT gradient. Left ventricular ejection fraction, by estimation, is 60 to 65%. The left ventricle has normal function. The left ventricle has no regional wall motion abnormalities. There is severe asymmetric left ventricular hypertrophy of the basal-septal segment. Left ventricular diastolic parameters are indeterminate.  2. Right ventricular systolic function  Value Date/Time   CHOL 165 04/22/2023 0726   TRIG 107 04/22/2023 0726   HDL 53 04/22/2023 0726   CHOLHDL 3.1 04/22/2023 0726   VLDL 21 04/22/2023 0726   LDLCALC 91 04/22/2023 0726   HgbA1C  Lab Results  Component Value Date   HGBA1C 6.3 (H) 04/21/2023   Urine Drug Screen cocaine positive Alcohol Level    Component Value Date/Time   ETH <10 04/21/2023 1140     SIGNIFICANT DIAGNOSTIC STUDIES DG Swallowing Func-Speech Pathology  Result Date: 04/26/2023 Table formatting from the original  result was not included. Modified Barium Swallow Study Patient Details Name: Timothy [name] MRN: [id number] Date of Birth: [birth year] Today's Date: 04/26/2023 HPI/PMH: HPI: Patient is 67 y.o. 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 Pt s/p revascularization of the of the superior branch of the right MCA with mechanical thrombectomy achieving TICI 2C revascularization. PMH: ETOH abuse, CHF, CKD, HTN. Clinical Impression: Clinical Impression: Pt's oropharyngeal dysphagia includes timing, motor, and sensory components. He has reduced lingual control with disorganized posterior transit although minimal oral residue. There is anterior loss out of the R side of his mouth though. He has reduced hyolaryngeal movement and laryngeal vestibule closure, and when swallow is mistimed, he penetrates during the swallow across consistencies. Penetration also occurred occasionally from intermittent vallecular residue. Pt's volitional cough is weak and ineffective at clearing penetrates, which continue to fall over time until aspiration occurs. Aspiration for the most part is silent, although spontaneous coughing is also not effective. Pt has a hard time follow commands consistently enough to use strategies, but a chin tuck also did not improve airway protection. Pt is not yet ready for PO diet. Would continue Cortrak and consider therapeutic trials of boluses with SLP. Factors that may increase risk of adverse event in presence of aspiration [name] & [name] 2021): Factors that may increase risk of adverse event in presence of aspiration [name] & [name] 2021): Aspiration of thick, dense, and/or acidic materials; Weak cough; Presence of tubes (ETT, trach, NG, etc.); Dependence for feeding and/or oral hygiene; Limited mobility; Reduced saliva Recommendations/Plan: Swallowing Evaluation Recommendations Swallowing Evaluation Recommendations Recommendations: NPO Medication  Administration: Via alternative means Oral care recommendations: Oral care QID (4x/day) Caregiver Recommendations: Have oral suction available Treatment Plan Treatment Plan Treatment recommendations: Therapy as outlined in treatment plan below Follow-up recommendations: Acute inpatient rehab (3 hours/day) Functional status assessment: Patient has had a recent decline in their functional status and demonstrates the ability to make significant improvements in function in a reasonable and predictable amount of time. Treatment frequency: Min 2x/week Treatment duration: 2 weeks Interventions: Aspiration precaution training; Compensatory techniques; Patient/family education; Trials of upgraded texture/liquids; Diet toleration management by SLP Recommendations Recommendations for follow up therapy are one component of a multi-disciplinary discharge planning process, led by the attending physician.  Recommendations may be updated based on patient status, additional functional criteria and insurance authorization. Assessment: Orofacial Exam: Orofacial Exam Oral Cavity: Oral Hygiene: WFL Oral Cavity - Dentition: Poor condition; Missing dentition Orofacial Anatomy: WFL Anatomy: Anatomy: WFL Boluses Administered: Boluses Administered Boluses Administered: Thin liquids (Level 0); Mildly thick liquids (Level 2, nectar thick); Moderately thick liquids (Level 3, honey thick); Puree  Oral Impairment Domain: Oral Impairment Domain Lip Closure: Escape beyond mid-chin Tongue control during bolus hold: Posterior escape of less than half of bolus Bolus preparation/mastication: -- (solid deferred) Bolus transport/lingual motion: Repetitive/disorganized tongue motion Oral residue: Trace residue lining oral

## 2023-04-29 NOTE — Discharge Summary (Deleted)
  The note originally documented on this encounter has been moved the the encounter in which it belongs.  

## 2023-04-29 NOTE — Progress Notes (Signed)
PROGRESS NOTE   Subjective/Complaints:   Pt and wife reports pt's LBM was 2-3 days ago- feels l a little constipated. Usually goes daily.  Denies pain  Peeing OK.  Slept OK. Frustrated about Cortrak/NPO  ROS:  Pt denies SOB, abd pain, CP, N/V/ (+)C/D, and vision changes Except HPI  Objective:   No results found. Recent Labs    04/28/23 0702 04/29/23 0650  WBC 4.4 4.6  HGB 12.2* 12.7*  HCT 38.4* 39.5  PLT 121* 130*   Recent Labs    04/28/23 0702 04/29/23 0650  NA 139 136  K 4.2 5.3*  CL 98 102  CO2 28 29  GLUCOSE 137* 120*  BUN 27* 23  CREATININE 1.34* 1.39*  CALCIUM 9.0 8.8*    Intake/Output Summary (Last 24 hours) at 04/29/2023 1035 Last data filed at 04/29/2023 0900 Gross per 24 hour  Intake 0 ml  Output --  Net 0 ml        Physical Exam: Vital Signs Blood pressure (!) 142/70, pulse (!) 51, temperature 98.4 F (36.9 C), temperature source Oral, resp. rate 18, height 5\' 7"  (1.702 m), weight 70 kg, SpO2 100%.    General: awake, alert, appropriate, sitting up slightly in bed; TF's running; Wife at bedside; NAD HENT:oropharynx dry- Cortrak in nose- running CV: regular rhythm, bradycardic  rate; no JVD Pulmonary: CTA B/L; no W/R/R- good air movement GI: soft, NT, ND, (+)BS Psychiatric: very flat; a little frustrated Neurological: alert Able to name 2 out of 3 items.  He was able to repeat part of the phrase I gave him.  Left gaze preference.  EOMI.  mild right-sided facial weakness.  Facial sensation intact bilaterally to light touch. RUE: 4-/5 Deltoid, 4-/5 Biceps, 4-/5 Triceps, 4-/5 Grip LUE: 5/5 Deltoid, 5/5 Biceps, 5/5 Triceps,  5/5 Grip RLE: HF 4/5, KE 4+/5, ADF 4+/5, APF 4+/5 LLE: HF 5/5, KE 5/5, ADF 5/5, APF 5/5 Incision intact light touch in all 4 extremities Finger-nose and intact on the left, altered on the right in proportion to his weakness Musculoskeletal:  Normal bulk, no  hypertonia noted No joint swelling noted Right upper extremity fifth digit contracture-wife reports this is chronic   Assessment/Plan: 1. Functional deficits which require 3+ hours per day of interdisciplinary therapy in a comprehensive inpatient rehab setting. Physiatrist is providing close team supervision and 24 hour management of active medical problems listed below. Physiatrist and rehab team continue to assess barriers to discharge/monitor patient progress toward functional and medical goals  Care Tool:  Bathing              Bathing assist       Upper Body Dressing/Undressing Upper body dressing        Upper body assist      Lower Body Dressing/Undressing Lower body dressing            Lower body assist       Toileting Toileting    Toileting assist       Transfers Chair/bed transfer  Transfers assist           Locomotion Ambulation   Ambulation assist  Walk 10 feet activity   Assist           Walk 50 feet activity   Assist           Walk 150 feet activity   Assist           Walk 10 feet on uneven surface  activity   Assist           Wheelchair     Assist               Wheelchair 50 feet with 2 turns activity    Assist            Wheelchair 150 feet activity     Assist          Blood pressure (!) 142/70, pulse (!) 51, temperature 98.4 F (36.9 C), temperature source Oral, resp. rate 18, height 5\' 7"  (1.702 m), weight 70 kg, SpO2 100%.  Medical Problem List and Plan: 1. 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  Etiology suspected to be A-fib not on anticoagulation versus large vessel disease             -patient may shower             -ELOS/Goals: 10 to 14 days, supervision to min assist with PT, OT, SLP             Con't CIR PT and OT and SLP  First day of evaluations 2.   Antithrombotics: -DVT/anticoagulation:  Pharmaceutical: Heparin initiated for anticoagulation. PLAN TO TRANSITION TO ELIQUIS 5 MG BID ONCE ABLE TO SWALLOW.              -antiplatelet therapy: Aspirin 325 mg daily 3. Pain Management: tylenol as needed 4. Mood/Behavior/Sleep: Provide emotional support             -antipsychotic agents: N/A 5. Neuropsych/cognition: This patient is not capable of making decisions on his own behalf. 6. Skin/Wound Care: Routine skin checks 7. Fluids/Electrolytes/Nutrition: Routine and analysis with follow-up chemistries 8.  Dysphagia.  Currently NPO, Cortrak. Speech therapy follow-up. 9.  Hypertension.  Imdur 60 mg daily.  Monitor with increased mobility.  Long-term blood pressure goal normotensive  9/26- will change Imdur to immediate release- d/w pharmacy- changed 10.  Bradycardia into the 40s.  Coreg held.  Avoid negative chronotropic medications.   Monitor with increased mobility 11.  Hyperlipidemia.  Pravachol 12.  CKD stage III.  Follow-up chemistries 13.  Diastolic congestive heart failure.  Monitor for any signs of fluid overload. 9/26- Weight 70kg. Will mointor for trend.   14.  History of tobacco/alcohol use/Cocaine.  Provide counseling 15.  Medical noncompliance.  Counseling 16.  Atrial fibrillation.  Hold Coreg due to bradycardia.  Continue ASA and heparin until able to swallow and can switch to Eliquis  17. Constipation  9/26- will give sorbitol after therapy today 18. Hyperkalemia  9/26- will recheck in AM- is 5.3- however was 4.1- so hesitant to give [facility] without rechecking.  19. Diabetes- on Tfs  9/26- will add SSI, since A1c 6.9 and on Tf's- do q4 hours per protocol.    I spent a total of 52   minutes on total care today- >50% coordination of care- due to  D/w pt and wife and exam; also d/w nursing x3 about SSI and need for coverage- and review of labs, vitals and intervention required      LOS: 1 days A FACE  TO FACE EVALUATION WAS  PERFORMED  Timothy [name] 04/29/2023, 10:35 AM

## 2023-04-29 NOTE — Plan of Care (Addendum)
  Problem: RH Swallowing Goal: LTG Patient will consume least restrictive diet using compensatory strategies with assistance (SLP) Description: LTG:  Patient will consume least restrictive diet using compensatory strategies with assistance (SLP) Flowsheets (Taken 04/29/2023 1318) LTG: Pt Patient will consume least restrictive diet using compensatory strategies with assistance of (SLP): Supervision Goal: LTG Patient will participate in dysphagia therapy to increase swallow function with assistance (SLP) Description: LTG:  Patient will participate in dysphagia therapy to increase swallow function with assistance (SLP) Flowsheets (Taken 04/29/2023 1318) LTG: Pt will participate in dysphagia therapy to increase swallow function with assistance of (SLP): Supervision Goal: LTG Pt will demonstrate functional change in swallow as evidenced by bedside/clinical objective assessment (SLP) Description: LTG: Patient will demonstrate functional change in swallow as evidenced by bedside/clinical objective assessment (SLP) Flowsheets (Taken 04/29/2023 1318) LTG: Patient will demonstrate functional change in swallow as evidenced by bedside/clinical objective assessment: Oropharyngeal swallow   Problem: RH Comprehension Communication Goal: LTG Patient will comprehend basic/complex auditory (SLP) Description: LTG: Patient will comprehend basic/complex auditory information with cues (SLP). Flowsheets (Taken 04/29/2023 1318) LTG: Patient will comprehend: Basic auditory information LTG: Patient will comprehend auditory information with cueing (SLP): Minimal Assistance   Problem: RH Expression Communication Goal: LTG Patient will verbally express basic/complex needs(SLP) Description: LTG:  Patient will verbally express basic/complex needs, wants or ideas with cues  (SLP) Flowsheets (Taken 04/29/2023 1318) LTG: Patient will verbally express basic/complex needs, wants or ideas (SLP): Minimal Assistance - Patient >  75% Goal: LTG Patient will increase speech intelligibility (SLP) Description: LTG: Patient will increase speech intelligibility at word/phrase/conversation level with cues, % of the time (SLP) Flowsheets (Taken 04/29/2023 1318) LTG: Patient will increase speech intelligibility (SLP): Supervision Goal: LTG Patient will increase word finding of common (SLP) Description: LTG:  Patient will increase word finding of common objects/daily info/abstract thoughts with cues using compensatory strategies (SLP). Flowsheets (Taken 04/29/2023 1318) LTG: Patient will increase word finding of common (SLP): Minimal Assistance - Patient > 75% Patient will use compensatory strategies to increase word finding of: Daily info   Problem: RH Attention Goal: LTG Patient will demonstrate this level of attention during functional activites (SLP) Description: LTG:  Patient will will demonstrate this level of attention during functional activites (SLP) Flowsheets (Taken 04/29/2023 1319) Patient will demonstrate during cognitive/linguistic activities the attention type of: Selective Patient will demonstrate this level of attention during cognitive/linguistic activities in: Controlled LTG: Patient will demonstrate this level of attention during cognitive/linguistic activities with assistance of (SLP): Supervision

## 2023-04-29 NOTE — Discharge Instructions (Addendum)
Inpatient Rehab Discharge Instructions  Timothy [name] Discharge date and time: No discharge date for patient encounter.   Activities/Precautions/ Functional Status: Activity: activity as tolerated Diet:  Wound Care: Routine skin checks Functional status:  ___ No restrictions     ___ Walk up steps independently ___ 24/7 supervision/assistance   ___ Walk up steps with assistance ___ Intermittent supervision/assistance  ___ Bathe/dress independently ___ Walk with walker     _x__ Bathe/dress with assistance ___ Walk Independently    ___ Shower independently ___ Walk with assistance    ___ Shower with assistance ___ No alcohol     ___ Return to work/school ________  Special Instructions: No driving smoking or alcohol    COMMUNITY REFERRALS UPON DISCHARGE:    Home Health:   PT  OT  SP  RN                 Agency:[facility] [phone]    Medical Equipment/Items Ordered:NONE NEEDED                                                 Agency/Supplier:NA    My questions have been answered and I understand these instructions. I will adhere to these goals and the provided educational materials after my discharge from the hospital.  Patient/Caregiver Signature _______________________________ Date __________  Clinician Signature _______________________________________ Date __________  Please bring this form and your medication list with you to all your follow-up doctor's appointments. STROKE/TIA DISCHARGE INSTRUCTIONS SMOKING Cigarette smoking nearly doubles your risk of having a stroke & is the single most alterable risk factor  If you smoke or have smoked in the last 12 months, you are advised to quit smoking for your health. Most of the excess cardiovascular risk related to smoking disappears within a year of stopping. Ask you doctor about anti-smoking medications [place] Quit Line: 1-800-QUIT NOW Free Smoking Cessation Classes [phone]  CHOLESTEROL Know your  levels; limit fat & cholesterol in your diet  Lipid Panel     Component Value Date/Time   CHOL 165 04/22/2023 0726   TRIG 107 04/22/2023 0726   HDL 53 04/22/2023 0726   CHOLHDL 3.1 04/22/2023 0726   VLDL 21 04/22/2023 0726   LDLCALC 91 04/22/2023 0726     Many patients benefit from treatment even if their cholesterol is at goal. Goal: Total Cholesterol (CHOL) less than 160 Goal:  Triglycerides (TRIG) less than 150 Goal:  HDL greater than 40 Goal:  LDL (LDLCALC) less than 100   BLOOD PRESSURE American Stroke Association blood pressure target is less that 120/80 mm/Hg  Your discharge blood pressure is:  BP: (!) 165/89 Monitor your blood pressure Limit your salt and alcohol intake Many individuals will require more than one medication for high blood pressure  DIABETES (A1c is a blood sugar average for last 3 months) Goal HGBA1c is under 7% (HBGA1c is blood sugar average for last 3 months)  Diabetes: No known diagnosis of diabetes    Lab Results  Component Value Date   HGBA1C 6.3 (H) 04/21/2023    Your HGBA1c can be lowered with medications, healthy diet, and exercise. Check your blood sugar as directed by your physician Call your physician if you experience unexplained or low blood sugars.  PHYSICAL ACTIVITY/REHABILITATION Goal is 30 minutes at least 4 days per week  Activity: Increase activity slowly,  Therapies: Physical Therapy: Home Health Return to work:  Activity decreases your risk of heart attack and stroke and makes your heart stronger.  It helps control your weight and blood pressure; helps you relax and can improve your mood. Participate in a regular exercise program. Talk with your doctor about the best form of exercise for you (dancing, walking, swimming, cycling).  DIET/WEIGHT Goal is to maintain a healthy weight  Your discharge diet is:  Diet Order             Diet NPO time specified  Diet effective now                   liquids Your height is:  Height:  5\' 7"  (170.2 cm) Your current weight is: Weight: 72 kg Your Body Mass Index (BMI) is:  BMI (Calculated): 24.85 Following the type of diet specifically designed for you will help prevent another stroke. Your goal weight range is:   Your goal Body Mass Index (BMI) is 19-24. Healthy food habits can help reduce 3 risk factors for stroke:  High cholesterol, hypertension, and excess weight.  RESOURCES Stroke/Support Group:  Call [phone]   STROKE EDUCATION PROVIDED/REVIEWED AND GIVEN TO PATIENT Stroke warning signs and symptoms How to activate emergency medical system (call 911). Medications prescribed at discharge. Need for follow-up after discharge. Personal risk factors for stroke. Pneumonia vaccine given: No Flu vaccine given: No My questions have been answered, the writing is legible, and I understand these instructions.  I will adhere to these goals & educational materials that have been provided to me after my discharge from the hospital.

## 2023-04-29 NOTE — Evaluation (Signed)
Speech Language Pathology Assessment and Plan  Patient Details  Name: Timothy [name] MRN: [id number] Date of Birth: Feb 03, 1956  SLP Diagnosis: Aphasia;Dysarthria;Apraxia;Cognitive Impairments;Dysphagia  Rehab Potential: Good ELOS: 12-14 days    Today's Date: 04/29/2023 SLP Individual Time: 4098-1191 SLP Individual Time Calculation (min): 60 min   Hospital Problem: Principal Problem:   Left middle cerebral artery stroke [facility])  Past Medical History:  Past Medical History:  Diagnosis Date   Alcohol abuse    Atrial fibrillation (HCC)    CHF (congestive heart failure) (HCC)    CKD (chronic kidney disease) stage 3, GFR 30-59 ml/min (HCC)    Hyperlipidemia    Hypertension    Noncompliance    Past Surgical History:  Past Surgical History:  Procedure Laterality Date   INGUINAL HERNIA REPAIR Right 07/17/2022   Procedure: HERNIA REPAIR INGUINAL INCARCERATED;  Surgeon: Lucretia Roers, MD;  Location: [facility];  Service: General;  Laterality: Right;   IR CT HEAD LTD  04/21/2023   IR PERCUTANEOUS ART THROMBECTOMY/INFUSION INTRACRANIAL INC DIAG ANGIO  04/21/2023   RADIOLOGY WITH ANESTHESIA N/A 04/21/2023   Procedure: IR WITH ANESTHESIA;  Surgeon: Radiologist, Medication, MD;  Location: [facility];  Service: Radiology;  Laterality: N/A;   XI ROBOTIC ASSISTED INGUINAL HERNIA REPAIR WITH MESH Left 03/19/2023   Procedure: XI ROBOTIC ASSISTED INGUINAL HERNIA REPAIR WITH MESH;  Surgeon: Franky Macho, MD;  Location: [facility];  Service: General;  Laterality: Left;    Assessment / Plan / Recommendation Clinical Impression Patient is a 67 year old right-handed male with history of atrial fibrillation on no anticoagulation, hypertension, diastolic congestive heart failure hyperlipidemia, CKD stage III, tobacco/alcohol use, medical noncompliance.  Presented 04/21/2023 with acute onset facial droop and aphasia.  Initial cranial CT scan showed focal area of decreased gray-white matter differentiation within the  posterior left frontal lobe, concerning for acute or subacute infarct.  No intracranial hemorrhage.  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  Calcified plaque in the intracranial ICAs resulting in mild to moderate stenosis bilaterally.  CT cerebral perfusion scan showed a 54 cc ischemic brain identified primarily in the left MCA distribution.    Patient underwent IR thrombectomy 04/21/2023 without complications per Dr. Corliss Skains.  Echocardiogram ejection fraction of 60 to 65% no wall motion abnormalities.  Patient was transferred to the ICU after thrombectomy and extubated.  Patient remains n.p.o. with alternative means of nutritional support.  He has a cortrak in place.  Therapy evaluations completed with recommendations for a comprehensive rehab program. Patient admitted 04/28/23.  Patient presents with both an expressive and receptive aphasia impacting his functional communication. Patient answered basic yes/no questions with 80% accuracy and followed 1-step commands with 100% accuracy. However, accuracy declined to 25% with complex yes/no questions and 60% with 2-step commands. Patient completed confrontational naming tasks with 100% accuracy but required mod cues for responsive naming to achieve 100% accuracy. Patient also completed automatic speech tasks with 90% accuracy but demonstrated increased difficulty with informal conversation at the basic level. Patient's overall functional communication is also impacted by deceased speech intelligibility due to imprecise consonants.  Throughout evaluation, patient also demonstrated decreased selective attention and decreased attention to right field of environment.   SLP performed oral care via the suction toothbrush and administered trials of ice chips. Patient demonstrated efficient  oral manipulation with a suspect delay  in swallow function. No overt s/s of aspiration observed. Recommend patient remain NPO with a repeat MBS needed prior to initiation of a PO diet which SLP suspects he will be ready for by early next week.   Patient would benefit from skilled SLP intervention to maximize his cognitive-linguistic and swallowing function prior to discharge.      Skilled Therapeutic Interventions          Administered a cognitive-linguistic evaluation and BSE, please see above for details.   SLP Assessment  Patient will need skilled Speech Lanaguage Pathology Services during CIR admission    Recommendations  SLP Diet Recommendations: NPO Medication Administration: Via alternative means Oral Care Recommendations: Oral care QID Patient destination: Home Follow up Recommendations: 24 hour supervision/assistance;Home Health SLP Equipment Recommended: To be determined    SLP Frequency 3 to 5 out of 7 days   SLP Duration  SLP Intensity  SLP Treatment/Interventions 12-14 days  Minumum of 1-2 x/day, 30 to 90 minutes  Cognitive remediation/compensation;Cueing hierarchy;Environmental controls;Dysphagia/aspiration precaution training;Functional tasks;Internal/external aids;Patient/family education;Speech/Language facilitation;Therapeutic Activities    Pain No/Denies Pain   Prior Functioning Type of Home: House  Lives With: Spouse;Family;Daughter Available Help at Discharge: Family;Available 24 hours/day  SLP Evaluation Cognition Overall Cognitive Status: Impaired/Different from baseline Arousal/Alertness: Awake/alert Orientation Level: Oriented to person;Oriented to place;Oriented to situation (when given choices from a field of 2) Attention: Sustained;Selective Sustained Attention: Impaired Sustained Attention Impairment: Verbal basic;Functional basic Selective Attention: Impaired Selective Attention Impairment: Verbal basic;Functional basic Memory:  Impaired Awareness: Impaired Awareness Impairment: Emergent impairment Problem Solving: Impaired Problem Solving Impairment: Functional basic Safety/Judgment: Impaired  Comprehension Auditory Comprehension Overall Auditory Comprehension: Impaired Yes/No Questions: Impaired Basic Biographical Questions: 76-100% accurate Basic Immediate Environment Questions: 75-100% accurate Commands: Impaired One Step Basic Commands: 75-100% accurate Two Step Basic Commands: 25-49% accurate Conversation: Simple Interfering Components: Attention;Motor planning;Processing speed EffectiveTechniques: Extra processing time;Visual/Gestural cues;Repetition Visual Recognition/Discrimination Discrimination: Not tested Reading Comprehension Reading Status: Not tested Expression Expression Primary Mode of Expression: Verbal Verbal Expression Overall Verbal Expression: Impaired Initiation: Impaired Automatic Speech: Name;Social Response Level of Generative/Spontaneous Verbalization: Word;Phrase Repetition: Impaired Level of Impairment: Sentence level Naming: Impairment Responsive: 51-75% accurate Confrontation: Impaired Pragmatics: Impairment Impairments: Eye contact Oral Motor Oral Motor/Sensory Function Overall Oral Motor/Sensory Function: Mild impairment Facial Symmetry: Abnormal symmetry right Facial Strength: Reduced right Motor Speech Overall Motor Speech: Impaired Respiration: Within functional limits Level of Impairment: Word Phonation: Normal Articulation: Impaired Level of Impairment: Word Intelligibility: Intelligibility reduced Word: 50-74% accurate Phrase: 50-74% accurate Sentence: Not tested Conversation: Not tested Motor Planning: Impaired  Care Tool Care Tool Cognition Ability to hear (with hearing aid or hearing appliances if normally used Ability to hear (with hearing aid or hearing appliances if normally used): 1. Minimal difficulty - difficulty in some environments  (e.g. when person speaks softly or setting is noisy)   Expression of Ideas and Wants Expression of Ideas and Wants: 2. Frequent difficulty - frequently exhibits difficulty with expressing needs and ideas   Understanding Verbal and Non-Verbal Content Understanding Verbal and Non-Verbal Content: 2. Sometimes understands - understands only basic conversations or simple, direct phrases. Frequently requires cues to understand  Memory/Recall Ability Memory/Recall Ability : That he or she is in a hospital/hospital unit    Bedside Swallowing Assessment General Date of Onset: 04/21/23 Previous Swallow Assessment: MBS on 9/23: Recommended patient remain NPO Diet Prior to this Study: NPO Temperature Spikes Noted: No Respiratory Status: Room air Behavior/Cognition: Alert;Cooperative;Pleasant mood Oral Cavity - Dentition: Poor condition;Missing dentition Self-Feeding Abilities: Needs  assist Vision: Functional for self-feeding Patient Positioning: Upright in bed Baseline Vocal Quality: Normal Volitional Cough: Strong Volitional Swallow: Able to elicit  Ice Chips Ice chips: Within functional limits Presentation: Cup;Spoon Thin Liquid Thin Liquid: Not tested Nectar Thick Nectar Thick Liquid: Not tested Honey Thick Honey Thick Liquid: Not tested Puree Puree: Not tested Solid Solid: Not tested BSE Assessment Risk for Aspiration Impact on safety and function: Moderate aspiration risk Other Related Risk Factors: Deconditioning  Short Term Goals: Week 1: SLP Short Term Goal 1 (Week 1): Patient will consume trials of ice chips/sips of thin liquids via tsp with minimal overt s/s of aspiration with Min verbal cues over 2 sessions to assess readiness for repeat MBS. SLP Short Term Goal 2 (Week 1): Patient will answer biographical questions with 75% accuracy and Mod verbal cues. SLP Short Term Goal 3 (Week 1): Patient will attend to right visual field during functional tasks with Min verbal  cues. SLP Short Term Goal 4 (Week 1): Patient will demonstrate selective attention to tasks in a mildly distracting enviornment for 30 minutes with Min verbal cues for redirection. SLP Short Term Goal 5 (Week 1): Patient will verbalize basic wants/needs at the phrase level with Mod A multimodal cues. SLP Short Term Goal 6 (Week 1): Patient will utilize speech intelligibility strategies at the phrase level to achieve ~75% intelligibility with Mod verbal cues.  Refer to Care Plan for Long Term Goals  Recommendations for other services: None   Discharge Criteria: Patient will be discharged from SLP if patient refuses treatment 3 consecutive times without medical reason, if treatment goals not met, if there is a change in medical status, if patient makes no progress towards goals or if patient is discharged from hospital.  The above assessment, treatment plan, treatment alternatives and goals were discussed and mutually agreed upon: by patient and by family  Blia Totman 04/29/2023, 3:49 PM

## 2023-04-29 NOTE — Progress Notes (Signed)
Inpatient Rehabilitation  Patient information reviewed and entered into eRehab system by Demarrio Menges Jewels Langone, OTR/L, Rehab Quality Coordinator.   Information including medical coding, functional ability and quality indicators will be reviewed and updated through discharge.   

## 2023-04-29 NOTE — Evaluation (Signed)
Occupational Therapy Assessment and Plan  Patient Details  Name: Timothy [name] MRN: [id number] Date of Birth: [birth year]  OT Diagnosis: cognitive deficits, hemiplegia affecting dominant side, and muscle weakness (generalized) Rehab Potential: Rehab Potential (ACUTE ONLY): Good ELOS: ~ 10-14 days   Today's Date: 04/29/2023 OT Individual Time: 1345-1455 OT Individual Time Calculation (min): 70 min     Hospital Problem: Principal Problem:   Left middle cerebral artery stroke [facility])   Past Medical History:  Past Medical History:  Diagnosis Date   Alcohol abuse    Atrial fibrillation (HCC)    CHF (congestive heart failure) (HCC)    CKD (chronic kidney disease) stage 3, GFR 30-59 ml/min (HCC)    Hyperlipidemia    Hypertension    Noncompliance    Past Surgical History:  Past Surgical History:  Procedure Laterality Date   INGUINAL HERNIA REPAIR Right 07/17/2022   Procedure: HERNIA REPAIR INGUINAL INCARCERATED;  Surgeon: Lucretia Roers, MD;  Location: [facility];  Service: General;  Laterality: Right;   IR CT HEAD LTD  04/21/2023   IR PERCUTANEOUS ART THROMBECTOMY/INFUSION INTRACRANIAL INC DIAG ANGIO  04/21/2023   RADIOLOGY WITH ANESTHESIA N/A 04/21/2023   Procedure: IR WITH ANESTHESIA;  Surgeon: Radiologist, Medication, MD;  Location: [facility] OR;  Service: Radiology;  Laterality: N/A;   XI ROBOTIC ASSISTED INGUINAL HERNIA REPAIR WITH MESH Left 03/19/2023   Procedure: XI ROBOTIC ASSISTED INGUINAL HERNIA REPAIR WITH MESH;  Surgeon: Franky Macho, MD;  Location: [facility];  Service: General;  Laterality: Left;    Assessment & Plan Clinical Impression: Patient is a 67 y.o. year old male ight-handed male with history of atrial fibrillation on no anticoagulation, hypertension, diastolic congestive heart failure hyperlipidemia, CKD stage III, tobacco/alcohol use, medical noncompliance. Per chart review patient lives with spouse. Two-level home full bath on main level and bedroom upstairs. Independent  prior to admission and retired. Family reports they can assist 24/7 after discharge. Presented 04/21/2023 with acute onset facial droop and aphasia. Initial cranial CT scan showed focal area of decreased gray-white matter differentiation within the posterior left frontal lobe, concerning for acute or subacute infarct. No intracranial hemorrhage. 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 Calcified plaque in the intracranial ICAs resulting in mild to moderate stenosis bilaterally. CT cerebral perfusion scan showed a 54 cc ischemic brain identified primarily in the left MCA distribution. Admission chemistries unremarkable except creatinine 1.41, alcohol negative, urine drug screen positive cocaine, hemoglobin A1c 6.3. Patient underwent IR thrombectomy 04/21/2023 without complications per Dr. Corliss Skains. Echocardiogram ejection fraction of 60 to 65% no wall motion abnormalities. Patient was transferred to the ICU after thrombectomy and extubated. Patient remains n.p.o. with alternative means of nutritional support. He has a cortrak in place. He is frustrated by his n.p.o. status. Patient is currently on full-strength aspirin as well as IV heparin plan to change to Eliquis 5 mg twice daily after SLP determines as he can tolerate p.o. meds. Patient did have bouts of bradycardia into the 40s and Coreg was held and he does currently remain on Imdur as prior to admission.  Patient transferred to CIR on 04/28/2023 .    Patient currently requires max with basic self-care skills and min A for basic mobility   secondary to muscle weakness, decreased cardiorespiratoy endurance, impaired timing and sequencing, unbalanced muscle activation, motor apraxia, decreased coordination, and  decreased motor planning, decreased visual perceptual skills and decreased  visual motor skills, decreased midline orientation, decreased attention to left, decreased attention to right, and decreased motor planning, decreased attention, decreased awareness, decreased problem solving, decreased safety awareness, decreased memory, and delayed processing, and decreased sitting balance, decreased standing balance, decreased postural control, hemiplegia, and decreased balance strategies.  Prior to hospitalization, patient could complete ADL with independent .  Patient will benefit from skilled intervention to decrease level of assist with basic self-care skills and increase independence with basic self-care skills prior to discharge home with care partner.  Anticipate patient will require 24 hour supervision and follow up outpatient.  OT - End of Session Activity Tolerance: Tolerates 30+ min activity with multiple rests Endurance Deficit: Yes OT Assessment Rehab Potential (ACUTE ONLY): Good OT Patient demonstrates impairments in the following area(s): Balance;Behavior;Cognition;Edema;Endurance;Motor;Perception;Safety;Sensory;Skin Integrity OT Basic ADL's Functional Problem(s): Grooming;Bathing;Dressing;Toileting OT Transfers Functional Problem(s): Toilet;Tub/Shower OT Additional Impairment(s): Fuctional Use of Upper Extremity OT Plan OT Intensity: Minimum of 1-2 x/day, 45 to 90 minutes OT Frequency: 5 out of 7 days OT Duration/Estimated Length of Stay: ~ 10-14 days OT Treatment/Interventions: Balance/vestibular training;Disease mangement/prevention;Self Care/advanced ADL retraining;Therapeutic Exercise;Cognitive remediation/compensation;DME/adaptive equipment instruction;Pain management;Skin care/wound managment;UE/LE Strength taining/ROM;Community reintegration;Functional electrical stimulation;Patient/family education;Splinting/orthotics;UE/LE Coordination activities;Discharge planning;Functional mobility training;Psychosocial support;Therapeutic Activities OT Self Feeding  Anticipated Outcome(s): npo at this time OT Basic Self-Care Anticipated Outcome(s): supervision OT Toileting Anticipated Outcome(s): supervision OT Bathroom Transfers Anticipated Outcome(s): supervision OT Recommendation Recommendations for Other Services: Speech consult Patient destination: Home Follow Up Recommendations: Outpatient OT Equipment Recommended: To be determined   OT Evaluation Precautions/Restrictions  Precautions Precautions: Fall Precaution Comments: R inattention; SBP <180, cortrak Restrictions Weight Bearing Restrictions: No General Chart Reviewed: Yes Family/Caregiver Present: Yes (wife present) Vital Signs   Pain   Home Living/Prior Functioning Home Living Family/patient expects to be discharged to:: Private residence Living Arrangements: Spouse/significant other, Children, Other relatives Available Help at Discharge: Family, Available 24 hours/day Type of Home: House Home Access: Stairs to enter [employer] of Steps: 1 Entrance Stairs-Rails: None Home Layout: Two level, Full bath on main level, Bed/bath upstairs Alternate Level Stairs-Number of Steps: 13 Alternate Level Stairs-Rails: Right, Left Bathroom Shower/Tub: Tub/shower unit, Buyer, retail: Yes  Lives With: Spouse, Family, Daughter Prior Function Level of Independence: Independent with transfers, Independent with gait, Independent with basic ADLs  Able to Take Stairs?: Yes Driving: Yes Vision Baseline Vision/History: 1 Wears glasses (didnt wear them in the eval - taken from eariler encounter) Ability to See in Adequate Light: 0 Adequate Alignment/Gaze Preference: Head turned;Gaze left Visual Fields: Right visual field deficit Additional Comments: inattention the right Perception    Praxis Praxis: Impaired Praxis Impairment Details: Perseveration;Motor planning Cognition Cognition Overall Cognitive Status: Impaired/Different  from baseline Arousal/Alertness: Awake/alert Orientation Level: Person;Place;Situation Person: Oriented Place: Oriented Memory: Impaired Memory Impairment: Decreased recall of new information;Decreased short term memory Attention: Sustained;Selective Sustained Attention: Appears intact Sustained Attention Impairment: Functional basic Selective Attention: Impaired Selective Attention Impairment: Functional basic Awareness: Impaired Awareness Impairment: Emergent impairment Problem Solving: Impaired Problem Solving Impairment: Functional basic Safety/Judgment: Impaired Comments: inattentive to R side Brief Interview for Mental Status (BIMS) Repetition of Three Words (First Attempt): 1 Temporal Orientation: Year: Correct Temporal Orientation: Month: Missed by more than 1 month Temporal Orientation: Day: Incorrect Recall: "Sock": No, could not recall Recall: "Blue": No, could not recall Recall: "Bed": No, could not recall BIMS Summary Score: 4 Sensation Sensation Light Touch: Impaired Detail Light Touch Impaired Details: Impaired RUE Proprioception:  Impaired Detail Proprioception Impaired Details: Impaired RUE;Impaired RLE Coordination Gross Motor Movements are Fluid and Coordinated: No Fine Motor Movements are Fluid and Coordinated: No Finger Nose Finger Test: dysmetria Heel Shin Test: Undershoots bilaterally Motor  Motor Motor: Hemiplegia;Abnormal postural alignment and control  Trunk/Postural Assessment  Cervical Assessment Cervical Assessment: Within Functional Limits Thoracic Assessment Thoracic Assessment: Within Functional Limits Lumbar Assessment Lumbar Assessment: Within Functional Limits Postural Control Protective Responses: Delayed  Balance Balance Balance Assessed: Yes Static Sitting Balance Static Sitting - Balance Support: Feet supported Static Sitting - Level of Assistance: 5: Stand by assistance Dynamic Sitting Balance Dynamic Sitting - Balance  Support: Feet supported Dynamic Sitting - Level of Assistance: 5: Stand by assistance Static Standing Balance Static Standing - Balance Support: During functional activity Static Standing - Level of Assistance: 4: Min assist Dynamic Standing Balance Dynamic Standing - Balance Support: During functional activity Dynamic Standing - Level of Assistance: 4: Min assist Extremity/Trunk Assessment RUE Assessment RUE Assessment: Exceptions to [facility] Active Range of Motion (AROM) Comments: WFL General Strength Comments: impacted by inattention, decr sensation and 5th digit trigger finger RUE Body System: Neuro Brunstrum levels for arm and hand: Arm;Hand Brunstrum level for arm: Stage IV Movement is deviating from synergy Brunstrum level for hand: Stage IV Movements deviating from synergies LUE Assessment LUE Assessment: Within Functional Limits  Care Tool Care Tool Self Care Eating Eating activity did not occur: Safety/medical concerns      Oral Care         Bathing   Body parts bathed by patient: Right arm;Chest;Abdomen;Front perineal area;Buttocks;Right upper leg;Left upper leg;Face Body parts bathed by helper: Left arm;Right lower leg;Left lower leg   Assist Level: Moderate Assistance - Patient 50 - 74%    Upper Body Dressing(including orthotics)   What is the patient wearing?: Pull over shirt   Assist Level: Moderate Assistance - Patient 50 - 74%    Lower Body Dressing (excluding footwear)   What is the patient wearing?: Incontinence brief;Pants Assist for lower body dressing: Moderate Assistance - Patient 50 - 74%    Putting on/Taking off footwear   What is the patient wearing?: Socks;Shoes Assist for footwear: Moderate Assistance - Patient 50 - 74%       Care Tool Toileting Toileting activity   Assist for toileting: Maximal Assistance - Patient 25 - 49%     Care Tool Bed Mobility Roll left and right activity   Roll left and right assist level: Supervision/Verbal  cueing    Sit to lying activity   Sit to lying assist level: Supervision/Verbal cueing    Lying to sitting on side of bed activity   Lying to sitting on side of bed assist level: the ability to move from lying on the back to sitting on the side of the bed with no back support.: Minimal Assistance - Patient > 75%     Care Tool Transfers Sit to stand transfer   Sit to stand assist level: Minimal Assistance - Patient > 75%    Chair/bed transfer   Chair/bed transfer assist level: Minimal Assistance - Patient > 75%     Toilet transfer   Assist Level: Minimal Assistance - Patient > 75%     Care Tool Cognition  Expression of Ideas and Wants Expression of Ideas and Wants: 2. Frequent difficulty - frequently exhibits difficulty with expressing needs and ideas  Understanding Verbal and Non-Verbal Content Understanding Verbal and Non-Verbal Content: 2. Sometimes understands - understands only basic conversations or simple, direct  phrases. Frequently requires cues to understand   Memory/Recall Ability Memory/Recall Ability : That he or she is in a hospital/hospital unit   Refer to Care Plan for Long Term Goals  SHORT TERM GOAL WEEK 1 OT Short Term Goal 1 (Week 1): Pt will don LB clothing with contact guard with extra itme. OT Short Term Goal 2 (Week 1): Pt will perform grooming task in standing with supervision OT Short Term Goal 3 (Week 1): Pt will perform toileting with min A  3/3 tasks OT Short Term Goal 4 (Week 1): Pt will use right hand at diminsted level  Recommendations for other services: None    Skilled Therapeutic Intervention 1:1 eval initiated  with OT goals, role and purpose discussed with pt and pt's wife. Pt received in bed. Pt participated in self care retraining at sink level due to continuous Heprin drip. Pt required cues and assistance to use right hand to come tasks thoroughly. Pt able to perform sit to stand with contact guard. Pt had been incontinent of urine (a lot)  but unaware. Pt ambulated from room to dayroom with contact guard to min A at a slow pace and needing cues for attention to the right. Participated in functional reach, grasp and release of table top items. Pt ambulated back to room and left sitting up in w/c with safety belt donned and wife at bedside.   ADL ADL Eating: NPO Grooming:  (wife has been shaving pt- he is right handed and he has hemiplegia) Upper Body Bathing: Minimal assistance Where Assessed-Upper Body Bathing: Sitting at sink Lower Body Bathing: Moderate assistance Where Assessed-Lower Body Bathing: Sitting at sink Upper Body Dressing: Maximal assistance Where Assessed-Upper Body Dressing: Sitting at sink Lower Body Dressing: Maximal assistance Where Assessed-Lower Body Dressing: Sitting at sink Toileting: Maximal assistance Where Assessed-Toileting: Teacher, adult education: Minimal assistance Mobility  Bed Mobility Supine to Sit: Supervision/Verbal cueing Sit to Supine: Supervision/Verbal cueing Transfers Sit to Stand: Minimal Assistance - Patient > 75% Stand to Sit: Minimal Assistance - Patient > 75%   Discharge Criteria: Patient will be discharged from OT if patient refuses treatment 3 consecutive times without medical reason, if treatment goals not met, if there is a change in medical status, if patient makes no progress towards goals or if patient is discharged from hospital.  The above assessment, treatment plan, treatment alternatives and goals were discussed and mutually agreed upon: by patient  Adan Sis 04/29/2023, 4:08 PM

## 2023-04-29 NOTE — Progress Notes (Signed)
Met with patient and wife at bedside.  Oriented to rehab. Discussed team conference on every Tuesday and will discuss goals, barriers, d/c date, and equipment needs. SW will follow up.  Discussed dysphagia and that speech will be working with patient. Discussed importance of taking daily weights at home to monitor fluid status.  Provided form for record keeping. Discussed s/s of fluid overload.  Discussed diet modifications to improve A1C of 6.8.  Was not taking any medications or monitoring blood sugar at home.  Unsure if will go home on oral medication.  Currently not checking blood sugar or on any medications and receiving tube feeds. All needs met, therapy in room.

## 2023-04-29 NOTE — Progress Notes (Signed)
Inpatient Rehabilitation Admission Medication Review by a Pharmacist  A complete drug regimen review was completed for this patient to identify any potential clinically significant medication issues.  High Risk Drug Classes Is patient taking? Indication by Medication  Antipsychotic No   Anticoagulant Yes IV heparin - CVA, stroke  Antibiotic No   Opioid No   Antiplatelet No   Hypoglycemics/insulin No   Vasoactive Medication Yes Norvasc - HTN Coreg - HTN Hydralazine - HTN Imdur - heart failure/ chest pain  Chemotherapy No   Other Yes Pravastatin -HLD     Type of Medication Issue Identified Description of Issue Recommendation(s)  Drug Interaction(s) (clinically significant)     Duplicate Therapy     Allergy     No Medication Administration End Date     Incorrect Dose     Additional Drug Therapy Needed     Significant med changes from prior encounter (inform family/care partners about these prior to discharge).    Other       Clinically significant medication issues were identified that warrant physician communication and completion of prescribed/recommended actions by midnight of the next day:  No  Name of provider notified for urgent issues identified: n/a  Provider Method of Notification: n/a  Pharmacist comments: all PTA meds resumed  Time spent performing this drug regimen review (minutes):  20 minutes  Thank you Okey Regal, PharmD

## 2023-04-29 NOTE — Progress Notes (Signed)
Initial Nutrition Assessment  DOCUMENTATION CODES:   Severe malnutrition in context of acute illness/injury  INTERVENTION:   Tube Feeding via Cortrak:  Osmolite 1.5 at 55 ml/hr Pro-Source TF20 60 mL daily TF at goal provides 2060 kcals, 103 g of protein and 1006 mL of free water  Increase free water to 200 mL q 4 hours. Total free water with TF plus scheduled free water flushes is 2203 mL  Continue MVI, thiamine, folic acid  NUTRITION DIAGNOSIS:   Severe Malnutrition related to acute illness as evidenced by moderate fat depletion, moderate muscle depletion, severe muscle depletion, percent weight loss (hernia repair in august with poor intake post op followed by admission for stroke).   GOAL:   Patient will meet greater than or equal to 90% of their needs   MONITOR:   TF tolerance, Diet advancement, Weight trends, Skin, I & O's  REASON FOR ASSESSMENT:   Consult Enteral/tube feeding initiation and management  ASSESSMENT:   67 yo male admitted to CIR post admission to [facility] post fall with right-sided facial droop and slurred speech and found to have a left MCA infarct with L M2 occlusion and required thrombectomy on 9/18. Pt with hx of HTN, HLD, atrial fibrillation, CHF, CKD3, and polysubstance abuse  9/18 S/P IR for thrombectomy, returned to unit on vent 9/19 extubated  9/20 Cortrak placed, TF initiated 9/25 Admitted to CIR  NPO, SLP following  Tolerating Osmolite 1.5 at 55 ml/hr with Pro-source TF20 60 mL daily via Cortrak tube Noted free water flush of 200 mL q 6 hours ordered as well.   Pt had large BM on 9/23 but nothing since. MD is aware and being addressed. Per wife, pt usually has a BM daily. Noted order for sorbitol x 1 today. Pending results, pt may need scheduled daily bowel regimen for now.   Wife at bedside and reports appetite prior to this acute admission had been poor for a while. Pt with hernia surgery in August and had not been doing well  prior to surgery with decreased intake in addition to vomiting. Post surgery on 8/16 pt with poor intake for 2 weeks and appetite had started to return with increased po intake just prior to admission on 9/18. TF was started on 9/20.   UBW 167 prior to hernia surgery; current wt 154 pounds. 8% wt loss  Pt with acute malnutrition, likely chronic component as well.   Labs: potassium 5.3 (H), Creatinine 1.39 (H), BUN 23 (wdl), CBGs 129-157 Meds: ss novolog, MVI with minerals, folic acid, thiamine   NUTRITION - FOCUSED PHYSICAL EXAM:  Flowsheet Row Most Recent Value  Orbital Region Moderate depletion  Upper Arm Region Moderate depletion  Thoracic and Lumbar Region Moderate depletion  Buccal Region Moderate depletion  Temple Region Severe depletion  Clavicle Bone Region Moderate depletion  Clavicle and Acromion Bone Region Moderate depletion  Scapular Bone Region Moderate depletion  Dorsal Hand Mild depletion  Patellar Region Severe depletion  Anterior Thigh Region Severe depletion  Posterior Calf Region Moderate depletion  Edema (RD Assessment) None       Diet Order:   Diet Order             Diet NPO time specified  Diet effective now                   EDUCATION NEEDS:   Education needs have been addressed  Skin:  Skin Assessment: Reviewed RN Assessment  Last BM:  9/23  Height:  Ht Readings from Last 1 Encounters:  04/28/23 5\' 7"  (1.702 m)    Weight:   Wt Readings from Last 1 Encounters:  04/29/23 70 kg     BMI:  Body mass index is 24.17 kg/m.  Estimated Nutritional Needs:   Kcal:  1900-2200 kcals  Protein:  95-115 g  Fluid:  >/= 2L    Romelle Starcher MS, RDN, LDN, CNSC Registered Dietitian 3 Clinical Nutrition RD Pager and On-Call Pager Number Located in [place]

## 2023-04-30 DIAGNOSIS — I63512 Cerebral infarction due to unspecified occlusion or stenosis of left middle cerebral artery: Secondary | ICD-10-CM | POA: Diagnosis not present

## 2023-04-30 DIAGNOSIS — E43 Unspecified severe protein-calorie malnutrition: Secondary | ICD-10-CM | POA: Insufficient documentation

## 2023-04-30 LAB — GLUCOSE, CAPILLARY
Glucose-Capillary: 122 mg/dL — ABNORMAL HIGH (ref 70–99)
Glucose-Capillary: 133 mg/dL — ABNORMAL HIGH (ref 70–99)
Glucose-Capillary: 134 mg/dL — ABNORMAL HIGH (ref 70–99)
Glucose-Capillary: 135 mg/dL — ABNORMAL HIGH (ref 70–99)
Glucose-Capillary: 138 mg/dL — ABNORMAL HIGH (ref 70–99)
Glucose-Capillary: 139 mg/dL — ABNORMAL HIGH (ref 70–99)
Glucose-Capillary: 142 mg/dL — ABNORMAL HIGH (ref 70–99)

## 2023-04-30 LAB — BASIC METABOLIC PANEL
Anion gap: 5 (ref 5–15)
BUN: 24 mg/dL — ABNORMAL HIGH (ref 8–23)
CO2: 28 mmol/L (ref 22–32)
Calcium: 8.6 mg/dL — ABNORMAL LOW (ref 8.9–10.3)
Chloride: 100 mmol/L (ref 98–111)
Creatinine, Ser: 1.36 mg/dL — ABNORMAL HIGH (ref 0.61–1.24)
GFR, Estimated: 56 mL/min — ABNORMAL LOW (ref >60)
Glucose, Bld: 165 mg/dL — ABNORMAL HIGH (ref 70–99)
Potassium: 4.2 mmol/L (ref 3.5–5.1)
Sodium: 133 mmol/L — ABNORMAL LOW (ref 135–145)

## 2023-04-30 LAB — HEPARIN LEVEL (UNFRACTIONATED): Heparin Unfractionated: 0.44 [IU]/mL (ref 0.30–0.70)

## 2023-04-30 MED ORDER — SORBITOL 70 % SOLN
60.0000 mL | Freq: Once | Status: AC
  Administered 2023-04-30: 60 mL
  Filled 2023-04-30: qty 60

## 2023-04-30 NOTE — Progress Notes (Signed)
Physical Therapy Session Note  Patient Details  Name: Timothy [name] MRN: [id number] Date of Birth: 03-10-1956  Today's Date: 04/30/2023 PT Individual Time: 833-900 and 1003-1105 PT Individual Time Calculation (min): 27 min and 62 min   Short Term Goals: Week 1:  PT Short Term Goal 1 (Week 1): Pt will complete sit to stand with CGA consistently PT Short Term Goal 2 (Week 1): Pt wil l complete bed to chair with CGA consistently. PT Short Term Goal 3 (Week 1): Pt willl ambulate x150' with CGA PT Short Term Goal 4 (Week 1): Pt will complete x13 steps with BHRs and CGA.  Skilled Therapeutic Interventions/Progress Updates: Tx 1: Pt presented in bed with wife attempting to shave pt. Explained to wife if he can participate in therapy will have pt left in chair to allow better positioning for shaving with pt and wife agreeable. Pt denies pain throughout session. Pt completed supine to sit with CGA and use of bed features. PTA threaded pants and donned socks total A for time management. Performed Sit to stand with CGA and pt was able to pull pants over hips with CGA. Pt then completed transfer to w/c with CGA without AD. Pt transported to day room for time management. Participated in gait training walking around 4W nsg station ~173ft without AD. Pt noted to have poor R foot clearance and decreased heel strike. With cues to was able to improve heel strike but difficult to maintain. Pt also required intermittent cues for safety when navigating around obstacles. Back at w/c pt set up with squigz on mirror and completed standing balance/reaching task of grabbing and pulling squigz off mirror for forced use of RUE. Once compelted pt transported back to room and agreeable to remain in w/c. Pt left with belt alarm on, call bell within reach and wife and dgt present.   Tx2: Pt presented in w/c with wife present agreeable to therapy. Pt denies pain throughout session. Session focused on balance, gait, and NMR via  forced use. Pt transported to day room for energy conservation and time management. Participated in [employer] with a score of 41/56 as noted below. Patient demonstrates increased fall risk as noted by score of  41/56 on Berg Balance Scale.  (<36= high risk for falls, close to 100%; 37-45 significant >80%; 46-51 moderate >50%; 52-55 lower >25%). Pt then worked on scanning and gait ambulating around day room searching for cones. Pt was able to locate 4/5 without assist but required several cues for last cone which was located in a busy/distracting environment. Pt also worked on Sit to stand without AD and LLE under 2in block for increased posterior chair ms recruitment . Pt cued to not brace RLE against mat and was able to complete x 10 with close supervision. Pt also performed ambulating forward and backward. Pt noted to have narrow BOS and shortened step length when walking backwards. PTA then had pt break down only stepping backwards then forward with cues for increased step length x 10. Pt then was able to complete backwards stepping with cues for increased step length with RLE with improved carryover. After seated rest pt then ambulated back to room with CGA and improved heel strike/toe off of RLE. With fatigue pt noted to have decreased R foot clearance but was able to correct with verbal cues. At EOB pt completed transfer sit to supine with CGA, however unsure if pt caught IV line or other malfunction. Upon compelting supine noted pt's IV was  leaking blood through port. Nsg notified and arm propped with pillow/towels placed under. Pt left resting in bed with nsg staff present and current needs met.          Therapy Documentation Precautions:  Precautions Precautions: Fall Precaution Comments: R inattention; SBP <180, cortrak Restrictions Weight Bearing Restrictions: No General:   Vital Signs: Therapy Vitals Temp: 98.6 F (37 C) Temp Source: Oral Pulse Rate: (!) 51 Resp: 18 BP:  118/73 Patient Position (if appropriate): Lying Oxygen Therapy SpO2: 95 % O2 Device: Room Air Pain:   Mobility:   Locomotion :    Trunk/Postural Assessment :    Balance:   Exercises:   Other Treatments:      Therapy/Group: Individual Therapy  Hershy Flenner 04/30/2023, 4:12 PM

## 2023-04-30 NOTE — Progress Notes (Signed)
Speech Language Pathology Daily Session Note  Patient Details  Name: Timothy [name] MRN: [id number] Date of Birth: 1956/06/03  Today's Date: 04/30/2023 SLP Individual Time: 1110-1200 SLP Individual Time Calculation (min): 50 min  Short Term Goals: Week 1: SLP Short Term Goal 1 (Week 1): Patient will consume trials of ice chips/sips of thin liquids via tsp with minimal overt s/s of aspiration with Min verbal cues over 2 sessions to assess readiness for repeat MBS. SLP Short Term Goal 2 (Week 1): Patient will answer biographical questions with 75% accuracy and Mod verbal cues. SLP Short Term Goal 3 (Week 1): Patient will attend to right visual field during functional tasks with Min verbal cues. SLP Short Term Goal 4 (Week 1): Patient will demonstrate selective attention to tasks in a mildly distracting enviornment for 30 minutes with Min verbal cues for redirection. SLP Short Term Goal 5 (Week 1): Patient will verbalize basic wants/needs at the phrase level with Mod A multimodal cues. SLP Short Term Goal 6 (Week 1): Patient will utilize speech intelligibility strategies at the phrase level to achieve ~75% intelligibility with Mod verbal cues.  Skilled Therapeutic Interventions: Initial minutes of evaluation missed due to nursing care and replacement of IV tubing. Upon completion, SLP addressed dysphagia and communication goals. SLP offered ice chips and tsp sips of thin liquids. Patient with immediate cough 2x during tsp sips of thin liquids, though no other s/sx of aspiration. Recommend continuation of NPO diet with alternative means of nutrition and medication administration. Patient demonstrating signs of readiness for repeat MBS next week. SLP addressed communication through yes/no questions and following commands. Patient with ~80% accuracy during mildly complex yes/no questions with minA. Patient required mod-maxA during functional command following using call bell. SLP also provided visual  poster to aid in orientation to place and time. Patient independently oriented to self and situation. Patient left in bed with alarm set and call bell in reach. Continue POC.    Pain No pain reported   Therapy/Group: Individual Therapy  Amiria Orrison M.A., CF-SLP 04/30/2023, 12:20 PM

## 2023-04-30 NOTE — Progress Notes (Signed)
Occupational Therapy Note  Patient Details  Name: Timothy [name] MRN: [id number] Date of Birth: 12/12/1955  Today's Date: 04/30/2023 OT Missed Time: 60 Minutes Missed Time Reason: Patient fatigue  Pt greeted sidelying in bed asleep. OT able to wake patient but he declined partiipation at this time due to fatigue. OT offered multiple options for participation, but he declined. Will follow up per plan of care.    Merlene Laughter Zharia Conrow 04/30/2023, 2:38 PM

## 2023-04-30 NOTE — IPOC Note (Signed)
Overall Plan of Care [facility]) Patient Details Name: Timothy [name] MRN: [id number] DOB: 28-Aug-1955  Admitting Diagnosis: Left middle cerebral artery stroke [facility])  Hospital Problems: Principal Problem:   Left middle cerebral artery stroke [facility]) Active Problems:   Protein-calorie malnutrition, severe     Functional Problem List: Nursing Safety, Bladder, Bowel, Sensory, Endurance, Medication Management, Motor, Nutrition  PT Balance, Endurance, Motor, Perception, Safety  OT Balance, Behavior, Cognition, Edema, Endurance, Motor, Perception, Safety, Sensory, Skin Integrity  SLP Behavior, Cognition, Linguistic, Nutrition  TR         Basic ADL's: OT Grooming, Bathing, Dressing, Toileting     Advanced  ADL's: OT       Transfers: PT Bed Mobility, Bed to Chair, Customer service manager, Tub/Shower     Locomotion: PT Ambulation, Stairs     Additional Impairments: OT Fuctional Use of Upper Extremity  SLP Communication, Swallowing, Social Cognition comprehension, expression Attention  TR      Anticipated Outcomes Item Anticipated Outcome  Self Feeding npo at this time  Swallowing  Supervision   Basic self-care  supervision  Toileting  supervision   Bathroom Transfers supervision  Bowel/Bladder  regain continence of bowel/bladder  Transfers  Editor, commissioning  Min A  Cognition  Supervision for attention  Pain  less than 3  Safety/Judgment  no falls   Therapy Plan: PT Intensity: Minimum of 1-2 x/day ,45 to 90 minutes PT Frequency: 5 out of 7 days PT Duration Estimated Length of Stay: 12-14 OT Intensity: Minimum of 1-2 x/day, 45 to 90 minutes OT Frequency: 5 out of 7 days OT Duration/Estimated Length of Stay: ~ 10-14 days SLP Intensity: Minumum of 1-2 x/day, 30 to 90 minutes SLP Frequency: 3 to 5 out of 7 days SLP Duration/Estimated Length of Stay: 12-14 days   Team Interventions: Nursing Interventions Patient/Family Education,  Medication Management, Psychosocial Support, Bladder Management, Bowel Management, Cognitive Remediation/Compensation, Disease Management/Prevention, Dysphagia/Aspiration Precaution Training, Discharge Planning  PT interventions Ambulation/gait training, Community reintegration, DME/adaptive equipment instruction, Neuromuscular re-education, Psychosocial support, Stair training, UE/LE Strength taining/ROM, Warden/ranger, Discharge planning, Therapeutic Activities, UE/LE Coordination activities, Cognitive remediation/compensation, Disease management/prevention, Functional mobility training, Patient/family education, Therapeutic Exercise, Visual/perceptual remediation/compensation  OT Interventions Balance/vestibular training, Disease mangement/prevention, Self Care/advanced ADL retraining, Therapeutic Exercise, Cognitive remediation/compensation, DME/adaptive equipment instruction, Pain management, Skin care/wound managment, UE/LE Strength taining/ROM, Community reintegration, Functional electrical stimulation, Patient/family education, Splinting/orthotics, UE/LE Coordination activities, Discharge planning, Functional mobility training, Psychosocial support, Therapeutic Activities  SLP Interventions Cognitive remediation/compensation, Cueing hierarchy, Environmental controls, Dysphagia/aspiration precaution training, Functional tasks, Internal/external aids, Patient/family education, Speech/Language facilitation, Therapeutic Activities  TR Interventions    SW/CM Interventions Discharge Planning, Psychosocial Support, Patient/Family Education   Barriers to Discharge MD  Medical stability, Home enviroment access/loayout, Lack of/limited family support, Medication compliance, Behavior, and Nutritional means  Nursing Decreased caregiver support, Home environment access/layout, Incontinence, Medication compliance, Nutrition means home with family 2 level home with full bath on main level  PT       OT      SLP      SW Decreased caregiver support     Team Discharge Planning: Destination: PT-Home ,OT- Home , SLP-Home Projected Follow-up: PT-24 hour supervision/assistance, Outpatient PT, OT-  Outpatient OT, SLP-24 hour supervision/assistance, Home Health SLP Projected Equipment Needs: PT-To be determined, OT- To be determined, SLP-To be determined Equipment Details: PT- , OT-  Patient/family involved in discharge planning: PT- Patient, Family member/caregiver,  OT-Family member/caregiver, Patient, SLP-Patient, Family member/caregiver  MD  ELOS: 12-14 days Medical Rehab Prognosis:  Good Assessment: The patient has been admitted for CIR therapies with the diagnosis of L MCA stroke. The team will be addressing functional mobility, strength, stamina, balance, safety, adaptive techniques and equipment, self-care, bowel and bladder mgt, patient and caregiver education, nutrition- has NGT. Goals have been set at supervision to min A. Anticipated discharge destination is home with wife.        See Team Conference Notes for weekly updates to the plan of care

## 2023-04-30 NOTE — Progress Notes (Addendum)
PROGRESS NOTE   Subjective/Complaints:   Pt's wife reports he pulled his cortrak by "messing with face"- but she was asleep when it occurred.   Cortrak pulled out some- ok'd to use- she mentioned they were trying to find mittens, but couldn't find any.  Got sorbitol yesterday but no BM yet.    ROS:  Limited by sleepiness Except HPI  Objective:   DG Abd 1 View  Result Date: 04/29/2023 CLINICAL DATA:  Encounter for feeding tube EXAM: ABDOMEN - 1 VIEW COMPARISON:  Abdominal x-ray 04/23/2023 FINDINGS: Feeding tube tip is in the fundus of the stomach. Bowel-gas pattern is nonobstructive. Contrast is seen throughout the colon. Radiopaque foreign body is noted soft tissues of the right hip, unchanged. IMPRESSION: Feeding tube tip is in the fundus of the stomach. Electronically Signed   By: Darliss Cheney M.D.   On: 04/29/2023 23:39   Recent Labs    04/28/23 0702 04/29/23 0650  WBC 4.4 4.6  HGB 12.2* 12.7*  HCT 38.4* 39.5  PLT 121* 130*   Recent Labs    04/29/23 0650 04/30/23 0540  NA 136 133*  K 5.3* 4.2  CL 102 100  CO2 29 28  GLUCOSE 120* 165*  BUN 23 24*  CREATININE 1.39* 1.36*  CALCIUM 8.8* 8.6*    Intake/Output Summary (Last 24 hours) at 04/30/2023 0816 Last data filed at 04/30/2023 0520 Gross per 24 hour  Intake 2225.42 ml  Output 825 ml  Net 1400.42 ml        Physical Exam: Vital Signs Blood pressure 120/73, pulse (!) 55, temperature 98.4 F (36.9 C), resp. rate 16, height 5\' 7"  (1.702 m), weight 70 kg, SpO2 100%.     General: asleep- wife at bedside;  NAD HENT: conjugate gaze; oropharynx dry- NGT in place, not cortrak-  CV: rate bradycardic- irregular; no JVD Pulmonary: CTA B/L; no W/R/R- good air movement GI: soft, NT, ND, (+)BS- hypoactive Psychiatric: asleep Neurological: sleeping-  Able to name 2 out of 3 items.  He was able to repeat part of the phrase I gave him.  Left gaze preference.   EOMI.  mild right-sided facial weakness.  Facial sensation intact bilaterally to light touch. RUE: 4-/5 Deltoid, 4-/5 Biceps, 4-/5 Triceps, 4-/5 Grip LUE: 5/5 Deltoid, 5/5 Biceps, 5/5 Triceps,  5/5 Grip RLE: HF 4/5, KE 4+/5, ADF 4+/5, APF 4+/5 LLE: HF 5/5, KE 5/5, ADF 5/5, APF 5/5 Incision intact light touch in all 4 extremities Finger-nose and intact on the left, altered on the right in proportion to his weakness Musculoskeletal:  Normal bulk, no hypertonia noted No joint swelling noted Right upper extremity fifth digit contracture-wife reports this is chronic   Assessment/Plan: 1. Functional deficits which require 3+ hours per day of interdisciplinary therapy in a comprehensive inpatient rehab setting. Physiatrist is providing close team supervision and 24 hour management of active medical problems listed below. Physiatrist and rehab team continue to assess barriers to discharge/monitor patient progress toward functional and medical goals  Care Tool:  Bathing    Body parts bathed by patient: Right arm, Chest, Abdomen, Front perineal area, Buttocks, Right upper leg, Left upper leg, Face   Body parts bathed  by helper: Left arm, Right lower leg, Left lower leg     Bathing assist Assist Level: Moderate Assistance - Patient 50 - 74%     Upper Body Dressing/Undressing Upper body dressing   What is the patient wearing?: Pull over shirt    Upper body assist Assist Level: Moderate Assistance - Patient 50 - 74%    Lower Body Dressing/Undressing Lower body dressing      What is the patient wearing?: Incontinence brief, Pants     Lower body assist Assist for lower body dressing: Moderate Assistance - Patient 50 - 74%     Toileting Toileting    Toileting assist Assist for toileting: Maximal Assistance - Patient 25 - 49%     Transfers Chair/bed transfer  Transfers assist     Chair/bed transfer assist level: Minimal Assistance - Patient > 75%      Locomotion Ambulation   Ambulation assist      Assist level: Minimal Assistance - Patient > 75% Assistive device: No Device Max distance: 150'   Walk 10 feet activity   Assist     Assist level: Minimal Assistance - Patient > 75% Assistive device: No Device   Walk 50 feet activity   Assist    Assist level: Minimal Assistance - Patient > 75% Assistive device: No Device    Walk 150 feet activity   Assist    Assist level: Minimal Assistance - Patient > 75% Assistive device: No Device    Walk 10 feet on uneven surface  activity   Assist     Assist level: Minimal Assistance - Patient > 75%     Wheelchair     Assist Is the patient using a wheelchair?: Yes Type of Wheelchair: Manual    Wheelchair assist level: Dependent - Patient 0% Max wheelchair distance: 150'    Wheelchair 50 feet with 2 turns activity    Assist        Assist Level: Dependent - Patient 0%   Wheelchair 150 feet activity     Assist      Assist Level: Dependent - Patient 0%   Blood pressure 120/73, pulse (!) 55, temperature 98.4 F (36.9 C), resp. rate 16, height 5\' 7"  (1.702 m), weight 70 kg, SpO2 100%.  Medical Problem List and Plan: 1. 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  Etiology suspected to be A-fib not on anticoagulation versus large vessel disease             -patient may shower             -ELOS/Goals: 10 to 14 days, supervision to min assist with PT, OT, SLP            Con't CIR PT, OT and SLP 2.  Antithrombotics: -DVT/anticoagulation:  Pharmaceutical: Heparin initiated for anticoagulation. PLAN TO TRANSITION TO ELIQUIS 5 MG BID ONCE ABLE TO SWALLOW.              -antiplatelet therapy: Aspirin 325 mg daily 3. Pain Management: tylenol as needed 4. Mood/Behavior/Sleep: Provide emotional support             -antipsychotic agents: N/A 5. Neuropsych/cognition: This patient is not  capable of making decisions on his own behalf. 6. Skin/Wound Care: Routine skin checks 7. Fluids/Electrolytes/Nutrition: Routine and analysis with follow-up chemistries 8.  Dysphagia.  Currently NPO, Cortrak. Speech therapy follow-up.  9/27- pulled cortrak accidentally last night- out a few inches- per nursing- KUB  looks OK- reviewed it- can use- mittens? 9.  Hypertension.  Imdur 60 mg daily.  Monitor with increased mobility.  Long-term blood pressure goal normotensive  9/26- will change Imdur to immediate release- d/w pharmacy- changed 10.  Bradycardia into the 40s.  Coreg held.  Avoid negative chronotropic medications.   Monitor with increased mobility 11.  Hyperlipidemia.  Pravachol 12.  CKD stage III.  Follow-up chemistries 13.  Diastolic congestive heart failure.  Monitor for any signs of fluid overload. 9/26- Weight 70kg. Will mointor for trend.  9/27- weight stable at 70 kg  14.  History of tobacco/alcohol use/Cocaine.  Provide counseling 15.  Medical noncompliance.  Counseling 16.  Atrial fibrillation.  Hold Coreg due to bradycardia.  Continue ASA and heparin until able to swallow and can switch to Eliquis  17. Constipation  9/26- will give sorbitol after therapy today  9/27- no results with sorbitol- will give another Sorbitol but 60cc and then soap suds enema if no results 18. Hyperkalemia  9/26- will recheck in AM- is 5.3- however was 4.1- so hesitant to give Lokelma without rechecking 9/27 - K+ 4.2- so likely hemolysis yesterday  19. Diabetes- on Tfs  9/26- will add SSI, since A1c 6.9 and on Tf's- do q4 hours per protocol.  20. Mild agitation?  9/27- pulled cortrak last night- per wife, just "accidentally messing with face"- she noted that they couldn't find mittens, per wife- will check into this- KUB looks OK to use Cortrak  I spent a total of 52    minutes on total care today- >50% coordination of care- due to  D/w wife about cortrak/NGT and mittens and bowels- also d/w  charge nurse ot get new cortrak and mittens, if possible  Also d/w pt's nurse and did IPOC   LOS: 2 days A FACE TO FACE EVALUATION WAS PERFORMED  Mykah Shin 04/30/2023, 8:16 AM

## 2023-04-30 NOTE — Progress Notes (Signed)
ANTICOAGULATION CONSULT NOTE   Pharmacy Consult for heparin Indication: atrial fibrillation and stroke  No Known Allergies  Patient Measurements: Height: 5\' 7"  (170.2 cm) Weight: 70 kg (154 lb 5.2 oz) IBW/kg (Calculated) : 66.1 Heparin Dosing Weight: 68 kg  Vital Signs: Temp: 98.4 F (36.9 C) (09/27 0525) BP: 120/73 (09/27 0525) Pulse Rate: 55 (09/27 0525)  Labs: Recent Labs    04/28/23 0702 04/28/23 1717 04/29/23 0650 04/30/23 0540  HGB 12.2*  --  12.7*  --   HCT 38.4*  --  39.5  --   PLT 121*  --  130*  --   HEPARINUNFRC 0.96* 0.85* 0.44 0.44  CREATININE 1.34*  --  1.39* 1.36*    Estimated Creatinine Clearance: 47.9 mL/min (A) (by C-G formula based on SCr of 1.36 mg/dL (H)).   Medical History: Past Medical History:  Diagnosis Date   Alcohol abuse    Atrial fibrillation (HCC)    CHF (congestive heart failure) (HCC)    CKD (chronic kidney disease) stage 3, GFR 30-59 ml/min (HCC)    Hyperlipidemia    Hypertension    Noncompliance     Assessment: Pt was admitted for new CVA. Hx of AF but not on anticoagulation. He got thrombectomy on 9/18. Hgb wnl, Plt 128.  Heparin level therapeutic   Goal of Therapy:  Heparin level 0.3-0.5 units/ml Monitor platelets by anticoagulation protocol: Yes   Plan:  Continue heparin at 1400 units / hr Follow up AM labs  Thank you Okey Regal, PharmD

## 2023-04-30 NOTE — Plan of Care (Signed)
Problem: RH Balance Goal: LTG: Patient will maintain dynamic sitting balance (OT) Description: LTG:  Patient will maintain dynamic sitting balance with assistance during activities of daily living (OT) Flowsheets (Taken 04/30/2023 1436) LTG: Pt will maintain dynamic sitting balance during ADLs with: Independent Goal: LTG Patient will maintain dynamic standing with ADLs (OT) Description: LTG:  Patient will maintain dynamic standing balance with assist during activities of daily living (OT)  Flowsheets (Taken 04/30/2023 1436) LTG: Pt will maintain dynamic standing balance during ADLs with: Supervision/Verbal cueing   Problem: Sit to Stand Goal: LTG:  Patient will perform sit to stand in prep for activites of daily living with assistance level (OT) Description: LTG:  Patient will perform sit to stand in prep for activites of daily living with assistance level (OT) Flowsheets (Taken 04/30/2023 1436) LTG: PT will perform sit to stand in prep for activites of daily living with assistance level: Supervision/Verbal cueing   Problem: RH Eating Goal: LTG Patient will perform eating w/assist, cues/equip (OT) Description: LTG: Patient will perform eating with assist, with/without cues using equipment (OT) Flowsheets (Taken 04/30/2023 1436) LTG: Pt will perform eating with assistance level of: Supervision/Verbal cueing   Problem: RH Grooming Goal: LTG Patient will perform grooming w/assist,cues/equip (OT) Description: LTG: Patient will perform grooming with assist, with/without cues using equipment (OT) Flowsheets (Taken 04/30/2023 1436) LTG: Pt will perform grooming with assistance level of: Supervision/Verbal cueing   Problem: RH Bathing Goal: LTG Patient will bathe all body parts with assist levels (OT) Description: LTG: Patient will bathe all body parts with assist levels (OT) Flowsheets (Taken 04/30/2023 1436) LTG: Pt will perform bathing with assistance level/cueing: Supervision/Verbal cueing    Problem: RH Dressing Goal: LTG Patient will perform upper body dressing (OT) Description: LTG Patient will perform upper body dressing with assist, with/without cues (OT). Flowsheets (Taken 04/30/2023 1436) LTG: Pt will perform upper body dressing with assistance level of: Supervision/Verbal cueing Goal: LTG Patient will perform lower body dressing w/assist (OT) Description: LTG: Patient will perform lower body dressing with assist, with/without cues in positioning using equipment (OT) Flowsheets (Taken 04/30/2023 1436) LTG: Pt will perform lower body dressing with assistance level of: Supervision/Verbal cueing   Problem: RH Toileting Goal: LTG Patient will perform toileting task (3/3 steps) with assistance level (OT) Description: LTG: Patient will perform toileting task (3/3 steps) with assistance level (OT)  Flowsheets (Taken 04/30/2023 1436) LTG: Pt will perform toileting task (3/3 steps) with assistance level: Supervision/Verbal cueing   Problem: RH Functional Use of Upper Extremity Goal: LTG Patient will use RT/LT upper extremity as a (OT) Description: LTG: Patient will use right/left upper extremity as a stabilizer/gross assist/diminished/nondominant/dominant level with assist, with/without cues during functional activity (OT) Flowsheets (Taken 04/30/2023 1436) LTG: Use of upper extremity in functional activities: RUE as diminished level   Problem: RH Toilet Transfers Goal: LTG Patient will perform toilet transfers w/assist (OT) Description: LTG: Patient will perform toilet transfers with assist, with/without cues using equipment (OT) Flowsheets (Taken 04/30/2023 1437) LTG: Pt will perform toilet transfers with assistance level of: Supervision/Verbal cueing   Problem: RH Tub/Shower Transfers Goal: LTG Patient will perform tub/shower transfers w/assist (OT) Description: LTG: Patient will perform tub/shower transfers with assist, with/without cues using equipment (OT) Flowsheets  (Taken 04/30/2023 1437) LTG: Pt will perform tub/shower stall transfers with assistance level of: Supervision/Verbal cueing   Problem: RH Awareness Goal: LTG: Patient will demonstrate awareness during functional activites type of (OT) Description: LTG: Patient will demonstrate awareness during functional activites type of (OT) Flowsheets (  Taken 04/30/2023 1437) LTG: Patient will demonstrate awareness during functional activites type of (OT): Supervision

## 2023-05-01 DIAGNOSIS — Z8679 Personal history of other diseases of the circulatory system: Secondary | ICD-10-CM

## 2023-05-01 DIAGNOSIS — N1831 Chronic kidney disease, stage 3a: Secondary | ICD-10-CM | POA: Diagnosis not present

## 2023-05-01 DIAGNOSIS — I63512 Cerebral infarction due to unspecified occlusion or stenosis of left middle cerebral artery: Secondary | ICD-10-CM | POA: Diagnosis not present

## 2023-05-01 DIAGNOSIS — I5022 Chronic systolic (congestive) heart failure: Secondary | ICD-10-CM

## 2023-05-01 LAB — GLUCOSE, CAPILLARY
Glucose-Capillary: 119 mg/dL — ABNORMAL HIGH (ref 70–99)
Glucose-Capillary: 119 mg/dL — ABNORMAL HIGH (ref 70–99)
Glucose-Capillary: 121 mg/dL — ABNORMAL HIGH (ref 70–99)
Glucose-Capillary: 126 mg/dL — ABNORMAL HIGH (ref 70–99)
Glucose-Capillary: 131 mg/dL — ABNORMAL HIGH (ref 70–99)
Glucose-Capillary: 147 mg/dL — ABNORMAL HIGH (ref 70–99)

## 2023-05-01 LAB — HEPARIN LEVEL (UNFRACTIONATED): Heparin Unfractionated: 0.56 [IU]/mL (ref 0.30–0.70)

## 2023-05-01 NOTE — Progress Notes (Signed)
PROGRESS NOTE   Subjective/Complaints:  No new issues   ROS:  Limited by sleepiness Except HPI  Objective:   DG Abd 1 View  Result Date: 04/29/2023 CLINICAL DATA:  Encounter for feeding tube EXAM: ABDOMEN - 1 VIEW COMPARISON:  Abdominal x-ray 04/23/2023 FINDINGS: Feeding tube tip is in the fundus of the stomach. Bowel-gas pattern is nonobstructive. Contrast is seen throughout the colon. Radiopaque foreign body is noted soft tissues of the right hip, unchanged. IMPRESSION: Feeding tube tip is in the fundus of the stomach. Electronically Signed   By: Darliss Cheney M.D.   On: 04/29/2023 23:39   Recent Labs    04/29/23 0650  WBC 4.6  HGB 12.7*  HCT 39.5  PLT 130*   Recent Labs    04/29/23 0650 04/30/23 0540  NA 136 133*  K 5.3* 4.2  CL 102 100  CO2 29 28  GLUCOSE 120* 165*  BUN 23 24*  CREATININE 1.39* 1.36*  CALCIUM 8.8* 8.6*    Intake/Output Summary (Last 24 hours) at 05/01/2023 1049 Last data filed at 05/01/2023 2956 Gross per 24 hour  Intake 1759.28 ml  Output 1350 ml  Net 409.28 ml        Physical Exam: Vital Signs Blood pressure 138/83, pulse (!) 53, temperature 98.3 F (36.8 C), temperature source Oral, resp. rate 18, height 5\' 7"  (1.702 m), weight 69.8 kg, SpO2 100%.  RUE: 4-/5 Deltoid, 4-/5 Biceps, 4-/5 Triceps, 4-/5 Grip LUE: 5/5 Deltoid, 5/5 Biceps, 5/5 Triceps,  5/5 Grip RLE: HF 4/5, KE 4+/5, ADF 4+/5, APF 4+/5 LLE: HF 5/5, KE 5/5, ADF 5/5, APF 5/5 Incision intact light touch in all 4 extremities Finger-nose and intact on the left, altered on the right in proportion to his weakness Musculoskeletal:  Normal bulk, no hypertonia noted No joint swelling noted Right upper extremity fifth digit contracture-wife reports this is chronic   Assessment/Plan: 1. Functional deficits which require 3+ hours per day of interdisciplinary therapy in a comprehensive inpatient rehab setting. Physiatrist is  providing close team supervision and 24 hour management of active medical problems listed below. Physiatrist and rehab team continue to assess barriers to discharge/monitor patient progress toward functional and medical goals  Care Tool:  Bathing    Body parts bathed by patient: Right arm, Chest, Abdomen, Front perineal area, Buttocks, Right upper leg, Left upper leg, Face   Body parts bathed by helper: Left arm, Right lower leg, Left lower leg     Bathing assist Assist Level: Moderate Assistance - Patient 50 - 74%     Upper Body Dressing/Undressing Upper body dressing   What is the patient wearing?: Pull over shirt    Upper body assist Assist Level: Moderate Assistance - Patient 50 - 74%    Lower Body Dressing/Undressing Lower body dressing      What is the patient wearing?: Incontinence brief, Pants     Lower body assist Assist for lower body dressing: Moderate Assistance - Patient 50 - 74%     Toileting Toileting    Toileting assist Assist for toileting: Maximal Assistance - Patient 25 - 49%     Transfers Chair/bed transfer  Transfers assist  Chair/bed transfer assist level: Minimal Assistance - Patient > 75%     Locomotion Ambulation   Ambulation assist      Assist level: Minimal Assistance - Patient > 75% Assistive device: No Device Max distance: 150'   Walk 10 feet activity   Assist     Assist level: Minimal Assistance - Patient > 75% Assistive device: No Device   Walk 50 feet activity   Assist    Assist level: Minimal Assistance - Patient > 75% Assistive device: No Device    Walk 150 feet activity   Assist    Assist level: Minimal Assistance - Patient > 75% Assistive device: No Device    Walk 10 feet on uneven surface  activity   Assist     Assist level: Minimal Assistance - Patient > 75%     Wheelchair     Assist Is the patient using a wheelchair?: Yes Type of Wheelchair: Manual    Wheelchair assist  level: Dependent - Patient 0% Max wheelchair distance: 150'    Wheelchair 50 feet with 2 turns activity    Assist        Assist Level: Dependent - Patient 0%   Wheelchair 150 feet activity     Assist      Assist Level: Dependent - Patient 0%   Blood pressure 138/83, pulse (!) 53, temperature 98.3 F (36.8 C), temperature source Oral, resp. rate 18, height 5\' 7"  (1.702 m), weight 69.8 kg, SpO2 100%.  Medical Problem List and Plan: 1. 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  Etiology suspected to be A-fib not on anticoagulation versus large vessel disease             -patient may shower             -ELOS/Goals: 10 to 14 days, supervision to min assist with PT, OT, SLP            Con't CIR PT, OT and SLP 2.  Antithrombotics: -DVT/anticoagulation:  Pharmaceutical: Heparin initiated for anticoagulation. PLAN TO TRANSITION TO ELIQUIS 5 MG BID ONCE ABLE TO SWALLOW. 9/28 still NPO              -antiplatelet therapy: Aspirin 325 mg daily 3. Pain Management: tylenol as needed 4. Mood/Behavior/Sleep: Provide emotional support             -antipsychotic agents: N/A 5. Neuropsych/cognition: This patient is not capable of making decisions on his own behalf. 6. Skin/Wound Care: Routine skin checks 7. Fluids/Electrolytes/Nutrition: Routine and analysis with follow-up chemistries 8.  Dysphagia.  Currently NPO, Cortrak. Speech therapy follow-up.  9/27- pulled cortrak accidentally last night- out a few inches- per nursing- KUB looks OK- reviewed it- can use- mittens? 9.  Hypertension.  Imdur 60 mg daily.  Monitor with increased mobility.  Long-term blood pressure goal normotensive  9/26- will change Imdur to immediate release- d/w pharmacy- changed Vitals:   04/30/23 1925 05/01/23 0314  BP: 134/74 138/83  Pulse: (!) 57 (!) 53  Resp: 18 18  Temp: 97.8 F (36.6 C) 98.3 F (36.8 C)  SpO2: 98% 100%    10.   Bradycardia into the 40s.  Coreg held.  Avoid negative chronotropic medications.   Monitor with increased mobility 11.  Hyperlipidemia.  Pravachol 12.  CKD stage III.  Follow-up chemistries    Latest Ref Rng & Units 04/30/2023    5:40 AM 04/29/2023    6:50 AM 04/28/2023  7:02 AM  BMP  Glucose 70 - 99 mg/dL 161  096  045   BUN 8 - 23 mg/dL 24  23  27    Creatinine 0.61 - 1.24 mg/dL 4.09  8.11  9.14   Sodium 135 - 145 mmol/L 133  136  139   Potassium 3.5 - 5.1 mmol/L 4.2  5.3  4.2   Chloride 98 - 111 mmol/L 100  102  98   CO2 22 - 32 mmol/L 28  29  28    Calcium 8.9 - 10.3 mg/dL 8.6  8.8  9.0     13.  Diastolic congestive heart failure.  Monitor for any signs of fluid overload. 9/28- stable  Filed Weights   04/29/23 0623 04/30/23 0500 05/01/23 0537  Weight: 70 kg 70 kg 69.8 kg    14.  History of tobacco/alcohol use/Cocaine.  Provide counseling 15.  Medical noncompliance.  Counseling 16.  Atrial fibrillation.  Hold Coreg due to bradycardia.  Continue ASA and heparin until able to swallow and can switch to Eliquis  17. Constipation  9/26- will give sorbitol after therapy today  9/27- no results with sorbitol- will give another Sorbitol but 60cc and then soap suds enema if no results 18. Hyperkalemia  9/26- will recheck in AM- is 5.3- however was 4.1- so hesitant to give Lokelma without rechecking 9/27 - K+ 4.2- so likely hemolysis yesterday  19. Diabetes- on Tfs  9/26- will add SSI, since A1c 6.9 and on Tf's- do q4 hours per protocol.  CBG (last 3)  Recent Labs    04/30/23 2352 05/01/23 0406 05/01/23 0739  GLUCAP 135* 121* 119*   9/28 controlled        LOS: 3 days A FACE TO FACE EVALUATION WAS PERFORMED  Erick Colace 05/01/2023, 10:49 AM

## 2023-05-01 NOTE — Progress Notes (Signed)
ANTICOAGULATION CONSULT NOTE   Pharmacy Consult for heparin Indication: atrial fibrillation and stroke  No Known Allergies  Patient Measurements: Height: 5\' 7"  (170.2 cm) Weight: 69.8 kg (153 lb 14.1 oz) IBW/kg (Calculated) : 66.1 Heparin Dosing Weight: 68 kg  Vital Signs: Temp: 98.3 F (36.8 C) (09/28 0314) Temp Source: Oral (09/28 0314) BP: 138/83 (09/28 0314) Pulse Rate: 53 (09/28 0314)  Labs: Recent Labs    04/29/23 0650 04/30/23 0540 05/01/23 0604  HGB 12.7*  --   --   HCT 39.5  --   --   PLT 130*  --   --   HEPARINUNFRC 0.44 0.44 0.56  CREATININE 1.39* 1.36*  --     Estimated Creatinine Clearance: 47.9 mL/min (A) (by C-G formula based on SCr of 1.36 mg/dL (H)).   Medical History: Past Medical History:  Diagnosis Date   Alcohol abuse    Atrial fibrillation (HCC)    CHF (congestive heart failure) (HCC)    CKD (chronic kidney disease) stage 3, GFR 30-59 ml/min (HCC)    Hyperlipidemia    Hypertension    Noncompliance     Assessment: Pt was admitted for new CVA. Hx of AF but not on anticoagulation. He got thrombectomy on 9/18. Hgb wnl, Plt 128.  Heparin level slightly high this AM  Goal of Therapy:  Heparin level 0.3-0.5 units/ml Monitor platelets by anticoagulation protocol: Yes   Plan:  Decrease heparin to 1100 units / hr Follow up AM labs  Thank you Okey Regal, PharmD

## 2023-05-02 DIAGNOSIS — I63512 Cerebral infarction due to unspecified occlusion or stenosis of left middle cerebral artery: Secondary | ICD-10-CM | POA: Diagnosis not present

## 2023-05-02 LAB — GLUCOSE, CAPILLARY
Glucose-Capillary: 110 mg/dL — ABNORMAL HIGH (ref 70–99)
Glucose-Capillary: 141 mg/dL — ABNORMAL HIGH (ref 70–99)
Glucose-Capillary: 142 mg/dL — ABNORMAL HIGH (ref 70–99)
Glucose-Capillary: 148 mg/dL — ABNORMAL HIGH (ref 70–99)
Glucose-Capillary: 150 mg/dL — ABNORMAL HIGH (ref 70–99)
Glucose-Capillary: 151 mg/dL — ABNORMAL HIGH (ref 70–99)

## 2023-05-02 LAB — CBC
HCT: 41.5 % (ref 39.0–52.0)
Hemoglobin: 13.9 g/dL (ref 13.0–17.0)
MCH: 27.9 pg (ref 26.0–34.0)
MCHC: 33.5 g/dL (ref 30.0–36.0)
MCV: 83.3 fL (ref 80.0–100.0)
Platelets: 165 10*3/uL (ref 150–400)
RBC: 4.98 MIL/uL (ref 4.22–5.81)
RDW: 13.5 % (ref 11.5–15.5)
WBC: 5 10*3/uL (ref 4.0–10.5)
nRBC: 0 % (ref 0.0–0.2)

## 2023-05-02 LAB — HEPARIN LEVEL (UNFRACTIONATED): Heparin Unfractionated: 0.18 [IU]/mL — ABNORMAL LOW (ref 0.30–0.70)

## 2023-05-02 NOTE — Progress Notes (Signed)
ANTICOAGULATION CONSULT NOTE   Pharmacy Consult for heparin Indication: atrial fibrillation and stroke  No Known Allergies  Patient Measurements: Height: 5\' 7"  (170.2 cm) Weight: 69.7 kg (153 lb 10.6 oz) IBW/kg (Calculated) : 66.1 Heparin Dosing Weight: 68 kg  Vital Signs:    Labs: Recent Labs    04/30/23 0540 05/01/23 0604 05/02/23 1112 05/02/23 1407  HGB  --   --  13.9  --   HCT  --   --  41.5  --   PLT  --   --  165  --   HEPARINUNFRC 0.44 0.56  --  0.18*  CREATININE 1.36*  --   --   --     Estimated Creatinine Clearance: 47.9 mL/min (A) (by C-G formula based on SCr of 1.36 mg/dL (H)).   Medical History: Past Medical History:  Diagnosis Date   Alcohol abuse    Atrial fibrillation (HCC)    CHF (congestive heart failure) (HCC)    CKD (chronic kidney disease) stage 3, GFR 30-59 ml/min (HCC)    Hyperlipidemia    Hypertension    Noncompliance     Assessment: Pt was admitted for new CVA. Hx of AF but not on anticoagulation. He got thrombectomy on 9/18. Hgb wnl, Plt 128.  Heparin level 0.18 this afternoon  Goal of Therapy:  Heparin level 0.3-0.5 units/ml Monitor platelets by anticoagulation protocol: Yes   Plan:  Increase heparin to 1150 units / hr Follow up AM labs  Thank you Okey Regal, PharmD

## 2023-05-02 NOTE — Progress Notes (Addendum)
Speech Language Pathology Daily Session Note  Patient Details  Name: Timothy [name] MRN: [id number] Date of Birth: 1956-07-24  Today's Date: 05/02/2023 SLP Individual Time: 1101-1200 SLP Individual Time Calculation (min): 59 min  Short Term Goals: Week 1: SLP Short Term Goal 1 (Week 1): Patient will consume trials of ice chips/sips of thin liquids via tsp with minimal overt s/s of aspiration with Min verbal cues over 2 sessions to assess readiness for repeat MBS. SLP Short Term Goal 2 (Week 1): Patient will answer biographical questions with 75% accuracy and Mod verbal cues. SLP Short Term Goal 3 (Week 1): Patient will attend to right visual field during functional tasks with Min verbal cues. SLP Short Term Goal 4 (Week 1): Patient will demonstrate selective attention to tasks in a mildly distracting enviornment for 30 minutes with Min verbal cues for redirection. SLP Short Term Goal 5 (Week 1): Patient will verbalize basic wants/needs at the phrase level with Mod A multimodal cues. SLP Short Term Goal 6 (Week 1): Patient will utilize speech intelligibility strategies at the phrase level to achieve ~75% intelligibility with Mod verbal cues.  Skilled Therapeutic Interventions:  Pt was seen in PM to address expressive and receptive language and swallowing function. Pt was alert upon SLP arrival and seen in bed. He was agreeable for session. SLP repositioned pt upright with pillows supporting his back. Pt completed oral hygiene via suction toothbrush. He was presented with small ice chips where pt demonstrated bolus manipulation, transit and, initiation of swallow with no overt s/sx pen/asp. He also consumed thin liquid via clinician administered boluses, ~1/2 tsp in size. Pt observed with adequate bolus stripping and observed swallow initiation. Pt again with no s/sx pen/asp. In additional minutes of session, SLP addressed language. Pt answered biographical yes/ no questions with 100% acc with sup  A. He was challenged in a naming task where he was presented with a visual stimulus. Pt completed task with 50% acc indep improving to 85% acc given carrier phrase, phonemic or syllabic cue. Pt also challenged to formulate statement given scenario (I.e. what would you say if you're thirsty?). He completed task with 67% acc with min to mod A. Pt left at bedside with call button within reach, bed alarm active, and wife present. SLP to continue POC.   Pain Pain Assessment Pain Scale: 0-10 Pain Score: 0-No pain  Therapy/Group: Individual Therapy  [name] 05/02/2023, 12:00 PM

## 2023-05-02 NOTE — Progress Notes (Signed)
Physical Therapy Session Note  Patient Details  Name: Timothy [name] MRN: [id number] Date of Birth: 03/16/1956  Today's Date: 05/02/2023 PT Individual Time: 1415-1526 PT Individual Time Calculation (min): 71 min   Short Term Goals: Week 1:  PT Short Term Goal 1 (Week 1): Pt will complete sit to stand with CGA consistently PT Short Term Goal 2 (Week 1): Pt wil l complete bed to chair with CGA consistently. PT Short Term Goal 3 (Week 1): Pt willl ambulate x150' with CGA PT Short Term Goal 4 (Week 1): Pt will complete x13 steps with BHRs and CGA.  Skilled Therapeutic Interventions/Progress Updates:      Pt supine in bed upon arrival. Pt agreeable to therapy. Pt denies any pain.   Pt donned shoes while seated EOB with set up assist. Therapist managing IV pole throughout session.   Pt ambulated with no AD and CGA from room to ortho gym, pt noted to have poor R LE clearance and poor heel strike, with poor improvement with verbal cues.   Pt performed sit to stand x10 with L LE on airex pad and CGA/close supervision for improved weight shift through R LE. Pt performed standing balance/reaching task with L LE on airex pad, and R UE on floor, with pt using R hand to place 9 cards total on their matching card and suit positioning to R of pt to facilitate weight shift to R LE, pt able to match all cards with min cues, verbal cues for matching to number and suit, but pt unable to accurately verbally identify the number as pt says 6 for all numbers without cuing, pt able to accurately identify 10% of numbers with verbal cues for prefix.   Pt performed alternating toe taps 2x10 B on 6 inch step with no AD and min A, verbal cues provided for LE clearance with emphasis on marching foot up to bring the foot from box to floor versus dragging it, verbal cues provided for light taps.   Therapist provided demonstration of heel strike with ambulation and pt demos improved RLE clearance with forward ambulation  from ortho gym to main gym, but difficult to maintain with navigating turns.   Pt ascended/descended 13 6 inch steps with B HR and CGA/min A, verbal cues provided for grip of rail with R UE, and attention to R UE, and external rotation of L LE to neutral with descent as pt demos tendency to severely internally rotate.   Pt performed sit to stand from mat table with arms across chest and CGA, verbal cues provided for technique.   Pt picked up 4 cones from floor with R UE and no AD and CGA-mod A, verbal cues provided for safety, with emphasis on stepping closer to cones if you initially drop it versus lunging body forward.   Pt ambulated from main gym to room with CGA/min A, verbal cues provided for scanning to R side of enviornment for obstacle negotitation. Pt demos improved R LE heel strike and foot clearance with verbal cues however inconsistent.   Pt supine in bed at end of session with bed alarm on and needs within reach.           Therapy Documentation Precautions:  Precautions Precautions: Fall Precaution Comments: R inattention; SBP <180, cortrak Restrictions Weight Bearing Restrictions: No  Therapy/Group: Individual Therapy  [facility] Ambrose Finland, [place], DPT  05/02/2023, 12:54 PM

## 2023-05-02 NOTE — Progress Notes (Signed)
Occupational Therapy Session Note  Patient Details  Name: Timothy [name] MRN: [id number] Date of Birth: 09-Jul-1956  Today's Date: 05/02/2023 OT Individual Time: 8546-2703 OT Individual Time Calculation (min): 56 min    Short Term Goals: Week 1:  OT Short Term Goal 1 (Week 1): Pt will don LB clothing with contact guard with extra itme. OT Short Term Goal 2 (Week 1): Pt will perform grooming task in standing with supervision OT Short Term Goal 3 (Week 1): Pt will perform toileting with min A  3/3 tasks OT Short Term Goal 4 (Week 1): Pt will use right hand at diminsted level  Skilled Therapeutic Interventions/Progress Updates:      Therapy Documentation Precautions:  Precautions Precautions: Fall Precaution Comments: R inattention; SBP <180, cortrak Restrictions Weight Bearing Restrictions: No General: "I slept good" Pt supine in bed upon OT arrival, agreeable to OT session. Wife present for session. Pt on tube feeding and heparin drip during session.  Pain: no pain reported  ADL: Bed mobility: SBA supine><EOB  Exercises: Pt participated in [facility] activity with small pegs and beads with theraputty, pt retrieving pegs/beads in putty  with occasional hand over hand assistance with positioning in order to initiate typical movement patterns for NMRE with RUE. Pt participated in activities  for increased dexterity and coordination in order to complete ADLs such as buttoning shirts. Pt completed tasks with PRN rest breaks d/t increased fatigue in RUE. Pt completed activities seated EOB SBA for activities. Pt also completed 20x power grip sponge squeezes in order to increase strength/ROM of Rt hand. Pt completed pincer grasp with moderate difficulty requiring hand over hand assistance with placement of digits. Pt requiring Mod VC for tasks to increase hand-eye coordination in order to increase spatial awareness during activities. Pt with VC to cross midline.    Other Treatments: Pt issued  sponge and yellow theraputty with beads in order to increase ROM and NMRE in room.  Pt supine in bed with bed alarm activated, 2 bed rails up, call light within reach and 4Ps assessed.   Therapy/Group: Individual Therapy  Velia Meyer, OTD, OTR/L 05/02/2023, 10:30 AM

## 2023-05-02 NOTE — Progress Notes (Signed)
PROGRESS NOTE   Subjective/Complaints:  Working with SLP on cognition as well as with swallowing.  He has a modified barium swallow scheduled for Tuesday.  ROS:  Limited by sleepiness Except HPI  Objective:   No results found. No results for input(s): "WBC", "HGB", "HCT", "PLT" in the last 72 hours.  Recent Labs    04/30/23 0540  NA 133*  K 4.2  CL 100  CO2 28  GLUCOSE 165*  BUN 24*  CREATININE 1.36*  CALCIUM 8.6*    Intake/Output Summary (Last 24 hours) at 05/02/2023 1030 Last data filed at 05/02/2023 1610 Gross per 24 hour  Intake 0 ml  Output 300 ml  Net -300 ml        Physical Exam: Vital Signs Blood pressure 122/75, pulse (!) 57, temperature 97.8 F (36.6 C), temperature source Oral, resp. rate 18, height 5\' 7"  (1.702 m), weight 69.7 kg, SpO2 98%.  General: No acute distress Mood and affect are appropriate Heart: Regular rate and rhythm no rubs murmurs or extra sounds Lungs: Clear to auscultation, breathing unlabored, no rales or wheezes Abdomen: Positive bowel sounds, soft nontender to palpation, nondistended Extremities: No clubbing, cyanosis, or edema Skin: No evidence of breakdown, no evidence of rash  Oriented to person place but not time RUE: 4-/5 Deltoid, 4-/5 Biceps, 4-/5 Triceps, 4-/5 Grip LUE: 5/5 Deltoid, 5/5 Biceps, 5/5 Triceps,  5/5 Grip RLE: HF 4/5, KE 4+/5, ADF 4+/5, APF 4+/5 LLE: HF 5/5, KE 5/5, ADF 5/5, APF 5/5 Incision intact light touch in all 4 extremities Finger-nose and intact on the left, altered on the right in proportion to his weakness Musculoskeletal:  Normal bulk, no hypertonia noted No joint swelling noted Right upper extremity fifth digit contracture-wife reports this is chronic   Assessment/Plan: 1. Functional deficits which require 3+ hours per day of interdisciplinary therapy in a comprehensive inpatient rehab setting. Physiatrist is providing close team  supervision and 24 hour management of active medical problems listed below. Physiatrist and rehab team continue to assess barriers to discharge/monitor patient progress toward functional and medical goals  Care Tool:  Bathing    Body parts bathed by patient: Right arm, Chest, Abdomen, Front perineal area, Buttocks, Right upper leg, Left upper leg, Face   Body parts bathed by helper: Left arm, Right lower leg, Left lower leg     Bathing assist Assist Level: Moderate Assistance - Patient 50 - 74%     Upper Body Dressing/Undressing Upper body dressing   What is the patient wearing?: Pull over shirt    Upper body assist Assist Level: Moderate Assistance - Patient 50 - 74%    Lower Body Dressing/Undressing Lower body dressing      What is the patient wearing?: Incontinence brief, Pants     Lower body assist Assist for lower body dressing: Moderate Assistance - Patient 50 - 74%     Toileting Toileting    Toileting assist Assist for toileting: Maximal Assistance - Patient 25 - 49%     Transfers Chair/bed transfer  Transfers assist     Chair/bed transfer assist level: Minimal Assistance - Patient > 75%     Locomotion Ambulation   Ambulation assist  Assist level: Minimal Assistance - Patient > 75% Assistive device: No Device Max distance: 150'   Walk 10 feet activity   Assist     Assist level: Minimal Assistance - Patient > 75% Assistive device: No Device   Walk 50 feet activity   Assist    Assist level: Minimal Assistance - Patient > 75% Assistive device: No Device    Walk 150 feet activity   Assist    Assist level: Minimal Assistance - Patient > 75% Assistive device: No Device    Walk 10 feet on uneven surface  activity   Assist     Assist level: Minimal Assistance - Patient > 75%     Wheelchair     Assist Is the patient using a wheelchair?: Yes Type of Wheelchair: Manual    Wheelchair assist level: Dependent -  Patient 0% Max wheelchair distance: 150'    Wheelchair 50 feet with 2 turns activity    Assist        Assist Level: Dependent - Patient 0%   Wheelchair 150 feet activity     Assist      Assist Level: Dependent - Patient 0%   Blood pressure 122/75, pulse (!) 57, temperature 97.8 F (36.6 C), temperature source Oral, resp. rate 18, height 5\' 7"  (1.702 m), weight 69.7 kg, SpO2 98%.  Medical Problem List and Plan: 1. 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  Etiology suspected to be A-fib not on anticoagulation versus large vessel disease             -patient may shower             -ELOS/Goals: 10 to 14 days, supervision to min assist with PT, OT, SLP            Con't CIR PT, OT and SLP 2.  Antithrombotics: -DVT/anticoagulation:  Pharmaceutical: Heparin initiated for anticoagulation. PLAN TO TRANSITION TO ELIQUIS 5 MG BID ONCE ABLE TO SWALLOW. 9/28 still NPO              -antiplatelet therapy: Aspirin 325 mg daily 3. Pain Management: tylenol as needed 4. Mood/Behavior/Sleep: Provide emotional support             -antipsychotic agents: N/A 5. Neuropsych/cognition: This patient is not capable of making decisions on his own behalf. 6. Skin/Wound Care: Routine skin checks 7. Fluids/Electrolytes/Nutrition: Routine and analysis with follow-up chemistries 8.  Dysphagia.  Currently NPO, Cortrak. Speech therapy follow-up.  9/27- pulled cortrak accidentally last night- out a few inches- per nursing- KUB looks OK- reviewed it- can use- mittens? Recheck modified barium swallow on Tuesday, October 1 9.  Hypertension.  Imdur 60 mg daily.  Monitor with increased mobility.  Long-term blood pressure goal normotensive  9/26- will change Imdur to immediate release- d/w pharmacy- changed Vitals:   05/01/23 1929 05/02/23 0312  BP: 137/82 122/75  Pulse: (!) 55 (!) 57  Resp: 18 18  Temp: 97.8 F (36.6 C) 97.8 F (36.6  C)  SpO2: 100% 98%    10.  Bradycardia into the 40s.  Coreg held.  Avoid negative chronotropic medications.   Monitor with increased mobility 11.  Hyperlipidemia.  Pravachol 12.  CKD stage III.  Follow-up chemistries    Latest Ref Rng & Units 04/30/2023    5:40 AM 04/29/2023    6:50 AM 04/28/2023    7:02 AM  BMP  Glucose 70 - 99 mg/dL 295  621  308  BUN 8 - 23 mg/dL 24  23  27    Creatinine 0.61 - 1.24 mg/dL 4.03  4.74  2.59   Sodium 135 - 145 mmol/L 133  136  139   Potassium 3.5 - 5.1 mmol/L 4.2  5.3  4.2   Chloride 98 - 111 mmol/L 100  102  98   CO2 22 - 32 mmol/L 28  29  28    Calcium 8.9 - 10.3 mg/dL 8.6  8.8  9.0     13.  Diastolic congestive heart failure.  Monitor for any signs of fluid overload. 9/28- stable  Filed Weights   04/30/23 0500 05/01/23 0537 05/02/23 0500  Weight: 70 kg 69.8 kg 69.7 kg    14.  History of tobacco/alcohol use/Cocaine.  Provide counseling 15.  Medical noncompliance.  Counseling 16.  Atrial fibrillation.  Hold Coreg due to bradycardia.  Continue ASA and heparin until able to swallow and can switch to Eliquis  17. Constipation  9/26- will give sorbitol after therapy today  9/27- no results with sorbitol- will give another Sorbitol but 60cc and then soap suds enema if no results 18. Hyperkalemia  9/26- will recheck in AM- is 5.3- however was 4.1- so hesitant to give Lokelma without rechecking 9/27 - K+ 4.2- so likely hemolysis yesterday  19. Diabetes- on Tfs  9/26- will add SSI, since A1c 6.9 and on Tf's- do q4 hours per protocol.  CBG (last 3)  Recent Labs    05/01/23 2357 05/02/23 0403 05/02/23 0803  GLUCAP 147* 148* 141*   9/28 controlled        LOS: 4 days A FACE TO FACE EVALUATION WAS PERFORMED  Timothy [name] 05/02/2023, 10:30 AM

## 2023-05-03 DIAGNOSIS — I639 Cerebral infarction, unspecified: Secondary | ICD-10-CM | POA: Diagnosis not present

## 2023-05-03 DIAGNOSIS — R4701 Aphasia: Secondary | ICD-10-CM | POA: Diagnosis not present

## 2023-05-03 DIAGNOSIS — I63512 Cerebral infarction due to unspecified occlusion or stenosis of left middle cerebral artery: Secondary | ICD-10-CM | POA: Diagnosis not present

## 2023-05-03 DIAGNOSIS — F09 Unspecified mental disorder due to known physiological condition: Secondary | ICD-10-CM | POA: Diagnosis not present

## 2023-05-03 LAB — CBC WITH DIFFERENTIAL/PLATELET
Abs Immature Granulocytes: 0.02 10*3/uL (ref 0.00–0.07)
Basophils Absolute: 0.1 10*3/uL (ref 0.0–0.1)
Basophils Relative: 1 %
Eosinophils Absolute: 0.2 10*3/uL (ref 0.0–0.5)
Eosinophils Relative: 6 %
HCT: 41.4 % (ref 39.0–52.0)
Hemoglobin: 13.5 g/dL (ref 13.0–17.0)
Immature Granulocytes: 1 %
Lymphocytes Relative: 25 %
Lymphs Abs: 0.9 10*3/uL (ref 0.7–4.0)
MCH: 28.1 pg (ref 26.0–34.0)
MCHC: 32.6 g/dL (ref 30.0–36.0)
MCV: 86.3 fL (ref 80.0–100.0)
Monocytes Absolute: 0.5 10*3/uL (ref 0.1–1.0)
Monocytes Relative: 12 %
Neutro Abs: 2.1 10*3/uL (ref 1.7–7.7)
Neutrophils Relative %: 55 %
Platelets: 148 10*3/uL — ABNORMAL LOW (ref 150–400)
RBC: 4.8 MIL/uL (ref 4.22–5.81)
RDW: 13.7 % (ref 11.5–15.5)
WBC: 3.8 10*3/uL — ABNORMAL LOW (ref 4.0–10.5)
nRBC: 0 % (ref 0.0–0.2)

## 2023-05-03 LAB — GLUCOSE, CAPILLARY
Glucose-Capillary: 146 mg/dL — ABNORMAL HIGH (ref 70–99)
Glucose-Capillary: 150 mg/dL — ABNORMAL HIGH (ref 70–99)
Glucose-Capillary: 124 mg/dL — ABNORMAL HIGH (ref 70–99)
Glucose-Capillary: 107 mg/dL — ABNORMAL HIGH (ref 70–99)
Glucose-Capillary: 120 mg/dL — ABNORMAL HIGH (ref 70–99)

## 2023-05-03 LAB — COMPREHENSIVE METABOLIC PANEL
ALT: 32 U/L (ref 0–44)
AST: 37 U/L (ref 15–41)
Albumin: 3.6 g/dL (ref 3.5–5.0)
Alkaline Phosphatase: 60 U/L (ref 38–126)
Anion gap: 9 (ref 5–15)
BUN: 29 mg/dL — ABNORMAL HIGH (ref 8–23)
CO2: 24 mmol/L (ref 22–32)
Calcium: 9.3 mg/dL (ref 8.9–10.3)
Chloride: 103 mmol/L (ref 98–111)
Creatinine, Ser: 1.48 mg/dL — ABNORMAL HIGH (ref 0.61–1.24)
GFR, Estimated: 51 mL/min — ABNORMAL LOW (ref >60)
Glucose, Bld: 132 mg/dL — ABNORMAL HIGH (ref 70–99)
Potassium: 4.8 mmol/L (ref 3.5–5.1)
Sodium: 136 mmol/L (ref 135–145)
Total Bilirubin: 0.8 mg/dL (ref 0.3–1.2)
Total Protein: 6.6 g/dL (ref 6.5–8.1)

## 2023-05-03 LAB — HEPARIN LEVEL (UNFRACTIONATED)
Heparin Unfractionated: 0.3 [IU]/mL (ref 0.30–0.70)
Heparin Unfractionated: 0.37 [IU]/mL (ref 0.30–0.70)

## 2023-05-03 MED ORDER — FREE WATER
250.0000 mL | Status: DC
  Administered 2023-05-03 – 2023-05-08 (×29): 250 mL

## 2023-05-03 MED ORDER — FREE WATER: 300.0000 mL | Status: DC

## 2023-05-03 NOTE — Progress Notes (Signed)
PROGRESS NOTE   Subjective/Complaints:  Pt reports doing well with therapy- has MBSS scheduled for tomorrow- excited about this- explained to pt will likely need to eat pureed food first and then work up to "regular food"- pt is ok with this.   Denies pain LBM yesterday   ROS:  Limited by sleepiness Except HPI  Objective:   No results found. Recent Labs    05/02/23 1112 05/03/23 0647  WBC 5.0 3.8*  HGB 13.9 13.5  HCT 41.5 41.4  PLT 165 148*    Recent Labs    05/03/23 0647  NA 136  K 4.8  CL 103  CO2 24  GLUCOSE 132*  BUN 29*  CREATININE 1.48*  CALCIUM 9.3    Intake/Output Summary (Last 24 hours) at 05/03/2023 0845 Last data filed at 05/02/2023 2338 Gross per 24 hour  Intake --  Output 1300 ml  Net -1300 ml        Physical Exam: Vital Signs Blood pressure 114/60, pulse (!) 58, temperature 98.1 F (36.7 C), temperature source Oral, resp. rate 18, height 5\' 7"  (1.702 m), weight 69.5 kg, SpO2 100%.   General: awake, alert, appropriate, sitting up in bed; wife at bedside; NAD HENT: conjugate gaze; oropharynx dry- cortrak in place CV: regular rhythm, slightly bradycardic  rate; no JVD Pulmonary: CTA B/L; no W/R/R- good air movement GI: soft, NT, ND, (+)normoactive Psychiatric: appropriate-= slightly more interactive Neurological: alert   Oriented to person place but not time RUE: 4-/5 Deltoid, 4-/5 Biceps, 4-/5 Triceps, 4-/5 Grip LUE: 5/5 Deltoid, 5/5 Biceps, 5/5 Triceps,  5/5 Grip RLE: HF 4/5, KE 4+/5, ADF 4+/5, APF 4+/5 LLE: HF 5/5, KE 5/5, ADF 5/5, APF 5/5 Incision intact light touch in all 4 extremities Finger-nose and intact on the left, altered on the right in proportion to his weakness Musculoskeletal:  Normal bulk, no hypertonia noted No joint swelling noted Right upper extremity fifth digit contracture-wife reports this is chronic   Assessment/Plan: 1. Functional deficits which  require 3+ hours per day of interdisciplinary therapy in a comprehensive inpatient rehab setting. Physiatrist is providing close team supervision and 24 hour management of active medical problems listed below. Physiatrist and rehab team continue to assess barriers to discharge/monitor patient progress toward functional and medical goals  Care Tool:  Bathing    Body parts bathed by patient: Right arm, Chest, Abdomen, Front perineal area, Buttocks, Right upper leg, Left upper leg, Face   Body parts bathed by helper: Left arm, Right lower leg, Left lower leg     Bathing assist Assist Level: Moderate Assistance - Patient 50 - 74%     Upper Body Dressing/Undressing Upper body dressing   What is the patient wearing?: Pull over shirt    Upper body assist Assist Level: Moderate Assistance - Patient 50 - 74%    Lower Body Dressing/Undressing Lower body dressing      What is the patient wearing?: Incontinence brief, Pants     Lower body assist Assist for lower body dressing: Moderate Assistance - Patient 50 - 74%     Toileting Toileting    Toileting assist Assist for toileting: Maximal Assistance - Patient 25 - 49%  Transfers Chair/bed transfer  Transfers assist     Chair/bed transfer assist level: Minimal Assistance - Patient > 75%     Locomotion Ambulation   Ambulation assist      Assist level: Minimal Assistance - Patient > 75% Assistive device: No Device Max distance: 150'   Walk 10 feet activity   Assist     Assist level: Minimal Assistance - Patient > 75% Assistive device: No Device   Walk 50 feet activity   Assist    Assist level: Minimal Assistance - Patient > 75% Assistive device: No Device    Walk 150 feet activity   Assist    Assist level: Minimal Assistance - Patient > 75% Assistive device: No Device    Walk 10 feet on uneven surface  activity   Assist     Assist level: Minimal Assistance - Patient > 75%      Wheelchair     Assist Is the patient using a wheelchair?: Yes Type of Wheelchair: Manual    Wheelchair assist level: Dependent - Patient 0% Max wheelchair distance: 150'    Wheelchair 50 feet with 2 turns activity    Assist        Assist Level: Dependent - Patient 0%   Wheelchair 150 feet activity     Assist      Assist Level: Dependent - Patient 0%   Blood pressure 114/60, pulse (!) 58, temperature 98.1 F (36.7 C), temperature source Oral, resp. rate 18, height 5\' 7"  (1.702 m), weight 69.5 kg, SpO2 100%.  Medical Problem List and Plan: 1. 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  Etiology suspected to be A-fib not on anticoagulation versus large vessel disease             -patient may shower             -ELOS/Goals: 10 to 14 days, supervision to min assist with PT, OT, SLP          Con't CIR PT, OT and SLP- MBSS tomorrow 2.  Antithrombotics: -DVT/anticoagulation:  Pharmaceutical: Heparin initiated for anticoagulation. PLAN TO TRANSITION TO ELIQUIS 5 MG BID ONCE ABLE TO SWALLOW. 9/28 still NPO              -antiplatelet therapy: Aspirin 325 mg daily 3. Pain Management: tylenol as needed  9/30- denies pain- con't regimen 4. Mood/Behavior/Sleep: Provide emotional support             -antipsychotic agents: N/A 5. Neuropsych/cognition: This patient is not capable of making decisions on his own behalf. 6. Skin/Wound Care: Routine skin checks 7. Fluids/Electrolytes/Nutrition: Routine and analysis with follow-up chemistries 8.  Dysphagia.  Currently NPO, Cortrak. Speech therapy follow-up.  9/27- pulled cortrak accidentally last night- out a few inches- per nursing- KUB looks OK- reviewed it- can use- mittens? Recheck modified barium swallow on Tuesday, October 1 9.  Hypertension.  Imdur 60 mg daily.  Monitor with increased mobility.  Long-term blood pressure goal normotensive  9/26- will  change Imdur to immediate release- d/w pharmacy- changed  9/30- HR slightly bradycardic- will monitor Vitals:   05/02/23 1946 05/03/23 0402  BP: 130/84 114/60  Pulse: (!) 58 (!) 58  Resp: 17 18  Temp: 98.1 F (36.7 C) 98.1 F (36.7 C)  SpO2: 99% 100%    10.  Bradycardia into the 40s.  Coreg held.  Avoid negative chronotropic medications.   Monitor with increased mobility  9/30- doing slightly  better- in 23s- not 40's 11.  Hyperlipidemia.  Pravachol 12.  CKD stage III.  Follow-up chemistries  9/30- Cr 1.48- up from 1.30- BUN up to 29- will increase Free water to 250 cc q4 - don't want to increase further due to [facility]-- and see how goes in labs tomorrow-     Latest Ref Rng & Units 05/03/2023    6:47 AM 04/30/2023    5:40 AM 04/29/2023    6:50 AM  BMP  Glucose 70 - 99 mg/dL 409  811  914   BUN 8 - 23 mg/dL 29  24  23    Creatinine 0.61 - 1.24 mg/dL 7.82  9.56  2.13   Sodium 135 - 145 mmol/L 136  133  136   Potassium 3.5 - 5.1 mmol/L 4.8  4.2  5.3   Chloride 98 - 111 mmol/L 103  100  102   CO2 22 - 32 mmol/L 24  28  29    Calcium 8.9 - 10.3 mg/dL 9.3  8.6  8.8     13.  Diastolic congestive heart failure.  Monitor for any signs of fluid overload. 9/28- stable   9/30- weight stable-  Filed Weights   05/01/23 0537 05/02/23 0500 05/03/23 0451  Weight: 69.8 kg 69.7 kg 69.5 kg    14.  History of tobacco/alcohol use/Cocaine.  Provide counseling 15.  Medical noncompliance.  Counseling 16.  Atrial fibrillation.  Hold Coreg due to bradycardia.  Continue ASA and heparin until able to swallow and can switch to Eliquis  17. Constipation  9/26- will give sorbitol after therapy today  9/27- no results with sorbitol- will give another Sorbitol but 60cc and then soap suds enema if no results 18. Hyperkalemia  9/26- will recheck in AM- is 5.3- however was 4.1- so hesitant to give Lokelma without rechecking 9/27 - K+ 4.2- so likely hemolysis yesterday  19. Diabetes- on Tfs  9/26- will add SSI,  since A1c 6.9 and on Tf's- do q4 hours per protocol.   9/30- CBGs running well CBG (last 3)  Recent Labs    05/02/23 2357 05/03/23 0401 05/03/23 0832  GLUCAP 151* 146* 150*     I spent a total of 35   minutes on total care today- >50% coordination of care- due to  Complex medical issues- due to elevated Cr/BUN but having dCHF and d/w pt and wife about MBSS.        LOS: 5 days A FACE TO FACE EVALUATION WAS PERFORMED  Zadrian Mccauley 05/03/2023, 8:45 AM

## 2023-05-03 NOTE — Consult Note (Signed)
Neuropsychological Consultation Comprehensive Inpatient Rehab   Patient:   Timothy [name]   DOB:   1956-02-14  MR Number:  [id number]  Location:  [facility] [address] [place] [address] Dept: [phone] Loc: [phone]           Date of Service:   05/03/2023  Start Time:   9 AM 10 End Time:   A.m.  Provider/Observer:  Arley Phenix, Psy.D.       Clinical Neuropsychologist       Billing Code/Service: [id number]  Reason for Service:    Timothy [name] is a 67 year old male referred for neuropsychological consultation following recent left frontal CVA with current admission onto the comprehensive inpatient rehabilitation unit.  Patient has past medical history including A-fib without medication management/anticoagulation, hypertensive, diastolic congestive heart failure, hyperlipidemia, chronic kidney disease stage III, remote tobacco abuse and ongoing alcohol use and has been medically noncompliant regarding his blood pressure etc.  Patient presented on 04/21/2023 with acute onset of right facial droop with aphasia.  CTA indicated occlusion of superior left M2 branch.  CT indicated left MCA stroke.  Urine drug screen was positive for cocaine.  Patient underwent IR thrombectomy on 04/21/2023 without complication.  Patient extubated with demonstration of significant expressive language deficits.  During the visit today with formal clinical interview the patient was not oriented to location or time.  He had limited understanding of his stroke status.  The deficits in orientation did not appear to be solely due to expressive language functions.  Patient noted that he was in "[facility]" and was not able to work out and has had what year it was.  This clearly goes beyond just expressive language and there are deficits with executive functions/reasoning.     HPI for the current admission:    HPI:  Timothy [name] is a 67 year old right-handed male with history of atrial fibrillation on no anticoagulation, hypertension, diastolic congestive heart failure hyperlipidemia, CKD stage III, tobacco/alcohol use, medical noncompliance. Per chart review patient lives with spouse. Two-level home full bath on main level and bedroom upstairs. Independent prior to admission and retired. Family reports they can assist 24/7 after discharge. Presented 04/21/2023 with acute onset facial droop and aphasia. Initial cranial CT scan showed focal area of decreased gray-white matter differentiation within the posterior left frontal lobe, concerning for acute or subacute infarct. No intracranial hemorrhage. 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 Calcified plaque in the intracranial ICAs resulting in mild to moderate stenosis bilaterally. CT cerebral perfusion scan showed a 54 cc ischemic brain identified primarily in the left MCA distribution. Admission chemistries unremarkable except creatinine 1.41, alcohol negative, urine drug screen positive cocaine, hemoglobin A1c 6.3. Patient underwent IR thrombectomy 04/21/2023 without complications per Dr. Corliss Skains. Echocardiogram ejection fraction of 60 to 65% no wall motion abnormalities. Patient was transferred to the ICU after thrombectomy and extubated. Patient remains n.p.o. with alternative means of nutritional support. He has a cortrak in place. He is frustrated by his n.p.o. status. Patient is currently on full-strength aspirin as well as IV heparin plan to change to Eliquis 5 mg twice daily after SLP determines as he can tolerate p.o. meds. Patient did have bouts of bradycardia into the 40s and Coreg was held and he does  currently remain on Imdur as prior to admission. Therapy evaluations completed due  to patient decreased functional mobility was admitted for a comprehensive rehab program.  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   Middle cerebral artery embolism, left 04/21/2023   Incarcerated left inguinal hernia 10/28/2022   Right inguinal hernia 07/17/2022   Chronic kidney disease, stage 3a (HCC) 07/17/2022   GERD (gastroesophageal reflux disease) 07/17/2022   Drug abuse (HCC) 07/17/2022   Nausea & vomiting 07/17/2022   Incarcerated hernia 07/17/2022   Small bowel obstruction (HCC) 07/16/2022   ERRONEOUS ENCOUNTER--DISREGARD 07/07/2022   Closed fracture of right distal radius 01/29/2022   Chronic systolic heart failure (HCC) 01/05/2022   AKI (acute kidney injury) (HCC) 12/22/2018   Alcohol withdrawal syndrome with complication, with unspecified complication (HCC) 12/22/2018   Secondary cardiomyopathy (HCC)    Typical atrial flutter (HCC)    Hypertensive emergency    Noncompliance    Acute renal failure superimposed on stage 3 chronic kidney disease (HCC)    Alcohol abuse    Hx of atrial flutter 12/21/2018   Essential hypertension, benign 10/29/2015   Mixed hyperlipidemia 10/29/2015   Obesity 10/29/2015   CKD (chronic kidney disease) 10/29/2015    Behavioral Observation/Mental Status:   Timothy [name]  presents as a 67 y.o.-year-old Right  handed African American Male who appeared his stated age. his dress was Appropriate and he was Well Groomed and his manners were Appropriate to the situation.  his participation was indicative of Inattentive behaviors.  There were physical disabilities noted.  he displayed an appropriate level of cooperation and motivation.    Interactions:    Active Inattentive and Redirectable  Attention:   abnormal and attention span appeared shorter than expected for age  Memory:   abnormal; remote memory intact, recent memory impaired  Visuo-spatial:   not examined  Speech (Volume):  low  Speech:   non-fluent aphasia; garbled  Thought Process:  Circumstantial and Tangential  Concrete and Circumstantial  Though Content:  WNL; not suicidal and not homicidal  Orientation:   person  Judgment:   Poor  Planning:   Poor  Affect:    Blunted  Mood:    Dysphoric  Insight:   Shallow  Intelligence:   normal  History of Substance Use or Abuse:  There is a documented history of alcohol and tobacco abuse confirmed by the patient.  Patient with Positive UDS for Cocaine.   Family Med/Psych History:  Family History  Problem Relation Age of Onset   Diabetes Mother    Hypertension Mother    Cancer Father        Prostate Cancer   Hypertension Father    Diabetes Sister    Diabetes Brother    Hypertension Brother    Hypertension Brother    Impression/DX:   Timothy [name] is a 67 year old male referred for neuropsychological consultation following recent left frontal CVA with current admission onto the comprehensive inpatient rehabilitation unit.  Patient has past medical history including A-fib without medication management/anticoagulation, hypertensive, diastolic congestive heart failure, hyperlipidemia, chronic kidney disease stage III, remote tobacco abuse and ongoing alcohol use and has been medically noncompliant regarding his blood pressure etc.  Patient presented on 04/21/2023 with acute onset  of right facial droop with aphasia.  CTA indicated occlusion of superior left M2 branch.  CT indicated left MCA stroke.  Urine drug screen was positive for cocaine.  Patient underwent IR thrombectomy on 04/21/2023 without complication.  Patient extubated with demonstration of significant expressive language deficits.  During the visit today with formal clinical interview the patient was not oriented to location or time.  He had limited understanding of his stroke status.  The deficits in orientation did not appear to be solely due to expressive language functions.  Patient noted that he was in "[facility]" and was not able to work out and has had what year it was.  This clearly goes beyond just expressive language and there are deficits with executive functions/reasoning.            Electronically Signed   _______________________ Arley Phenix, Psy.D. Clinical Neuropsychologist

## 2023-05-03 NOTE — Progress Notes (Signed)
Physical Therapy Session Note  Patient Details  Name: Timothy [name] MRN: [id number] Date of Birth: 1956-02-14  Today's Date: 05/03/2023 PT Individual Time: 4098-1191 PT Individual Time Calculation (min): 46 min   Short Term Goals: Week 1:  PT Short Term Goal 1 (Week 1): Pt will complete sit to stand with CGA consistently PT Short Term Goal 2 (Week 1): Pt wil l complete bed to chair with CGA consistently. PT Short Term Goal 3 (Week 1): Pt willl ambulate x150' with CGA PT Short Term Goal 4 (Week 1): Pt will complete x13 steps with BHRs and CGA.  Skilled Therapeutic Interventions/Progress Updates:    Session focused on functional gait without AD on unit with overall CGA with cues for increased step length and foot clearance on R (also difficult to full see with scrub bottoms on) > 150', NMR to address dynamic balance and weightshifting, and addressing overall core strengthening to aid with overall mobility in various positions. Transitioned into quadruped with min assist and able to maintain position but had difficulty with sustaining position when changing balance point and performing functional reaching  tasks - some apraxia also noted. Modified crunches with wedge behind pt x 10 reps. Then in standing with 3.3 lb weighted ball performed full body diagonals and then trunk rotation with focus on activation through lateral core musculature and rotation through trunk including weightshifting to the R and L, decreased rotation noted and maintains stiff positioning. Dynamic gait without AD forwards, backwards, and lateral sidestepping to both directions 25-30' each direction,. Rest breaks as needed due to fatigue. End of session transferred back to bed with all needs in reach.   Therapy Documentation Precautions:  Precautions Precautions: Fall Precaution Comments: R inattention; SBP <180, cortrak Restrictions Weight Bearing Restrictions: No    Pain: Denies pain.    Therapy/Group:  Individual Therapy  Karolee Stamps Darrol Poke, PT, DPT, CBIS  05/03/2023, 3:37 PM

## 2023-05-03 NOTE — Progress Notes (Signed)
Speech Language Pathology Daily Session Note  Patient Details  Name: Timothy [name] MRN: [id number] Date of Birth: 02-Nov-1955  Today's Date: 05/03/2023 SLP Individual Time: 1000-1100 SLP Individual Time Calculation (min): 60 min  Short Term Goals: Week 1: SLP Short Term Goal 1 (Week 1): Patient will consume trials of ice chips/sips of thin liquids via tsp with minimal overt s/s of aspiration with Min verbal cues over 2 sessions to assess readiness for repeat MBS. SLP Short Term Goal 2 (Week 1): Patient will answer biographical questions with 75% accuracy and Mod verbal cues. SLP Short Term Goal 3 (Week 1): Patient will attend to right visual field during functional tasks with Min verbal cues. SLP Short Term Goal 4 (Week 1): Patient will demonstrate selective attention to tasks in a mildly distracting enviornment for 30 minutes with Min verbal cues for redirection. SLP Short Term Goal 5 (Week 1): Patient will verbalize basic wants/needs at the phrase level with Mod A multimodal cues. SLP Short Term Goal 6 (Week 1): Patient will utilize speech intelligibility strategies at the phrase level to achieve ~75% intelligibility with Mod verbal cues.  Skilled Therapeutic Interventions: SLP conducted skilled therapy session targeting dysphagia management and communication goals. Patient answered basic biographical yes/no questions re: location, time, situation, and basic family history with 90% accuracy given minA. Throughout session, patient required min-mod verbal cues to attend to the right side during conversation. SLP targeted auditory comprehension with patient requiring max fading to min cues to answer questions based on simple structured short sentences. Patient required modA to answer yes/no questions re: events from his morning. SLP assisted patient with oral care with patient requiring setup, then completing oral care via suction toothbrush with supervisionA. SLP then trialed ice chips and  teaspoons of thin liquids. No overt s/sx of penetration/aspiration observed with ice chips .With thin liquids, observed immediate throat clear x1. Patient demonstrating improved oral bolus prep and control, thus appropriate for repeated MBS. Continue with NPO until results from updated swallow study are rendered. SLP placed order for MBS to take place 05/04/23 and patient agreeable to participate. Patient was left in lowered bed with call bell in reach and bed alarm set. SLP will continue to target goals per plan of care.    Pain Pain Assessment Pain Scale: 0-10 Pain Score: 0-No pain  Therapy/Group: Individual Therapy  [name], M.A., CCC-SLP  [name] 05/03/2023, 10:52 AM

## 2023-05-03 NOTE — Progress Notes (Signed)
Occupational Therapy Session Note  Patient Details  Name: Timothy [name] MRN: [id number] Date of Birth: [birth year]  Today's Date: 05/03/2023 OT Individual Time: 1300-1415 OT Individual Time Calculation (min): 75 min    Short Term Goals: Week 1:  OT Short Term Goal 1 (Week 1): Pt will don LB clothing with contact guard with extra itme. OT Short Term Goal 2 (Week 1): Pt will perform grooming task in standing with supervision OT Short Term Goal 3 (Week 1): Pt will perform toileting with min A  3/3 tasks OT Short Term Goal 4 (Week 1): Pt will use right hand at diminsted level  Skilled Therapeutic Interventions/Progress Updates:      Therapy Documentation Precautions:  Precautions Precautions: Fall Precaution Comments: R inattention; SBP <180, cortrak Restrictions Weight Bearing Restrictions: No General: "I am okay." Pt supine in bed upon OT arrival, agreeable to OT session.  Pain: no pain reported  ADL: Toileting: pt found incontinent of B&B at beginning of session. Pt completing pants management with Min A with assistance managing waistband on Rt side. Pt able to assist to complete peri care but required assistance with thoroughness d/t amount, extended time required LB dressing: Pt changed soiled pants Min A with assistance with Rt side over toe and waist, VC for use of RUE for increased coordination with pants management  Exercises: Pt participated in [facility] activity with various sized sponge pieces, pt retrieving sponges  with RUE and placing them onto table or giving them to OT at various heights and across midline  for increased dexterity and coordination in order to complete ADLs such as buttoning shirts. Pt completed tasks with PRN rest breaks d/t increased fatigue in RUE. Pt requiring Max VC and demonstration for hand positioning and coordination. Pt also completed purposeful RUE coordination with typical movement patterns with increased pace reaching out to touch OT hand at  various heights/planes for increased typical movement pattern for increased [facility].    Other Treatments: Pt re-educated on importance of hand/eye coordination for increasing independence with RUE [facility] and coordination with varied carryover d/t cognition.    Pt seated in W/C at end of session with W/C alarm donned, call light within reach and 4Ps assessed.    Therapy/Group: Individual Therapy  Velia Meyer, OTD, OTR/L 05/03/2023, 3:22 PM

## 2023-05-03 NOTE — Progress Notes (Signed)
ANTICOAGULATION CONSULT NOTE   Pharmacy Consult for heparin Indication: atrial fibrillation and stroke  No Known Allergies  Patient Measurements: Height: 5\' 7"  (170.2 cm) Weight: 69.5 kg (153 lb 3.5 oz) IBW/kg (Calculated) : 66.1 Heparin Dosing Weight: 68 kg  Vital Signs: Temp: 98.1 F (36.7 C) (09/30 0402) Temp Source: Oral (09/30 0402) BP: 114/60 (09/30 0402) Pulse Rate: 58 (09/30 0402)  Labs: Recent Labs    05/01/23 0604 05/02/23 1112 05/02/23 1407 05/03/23 0647  HGB  --  13.9  --  13.5  HCT  --  41.5  --  41.4  PLT  --  165  --  148*  HEPARINUNFRC 0.56  --  0.18* 0.30  CREATININE  --   --   --  1.48*    Estimated Creatinine Clearance: 44 mL/min (A) (by C-G formula based on SCr of 1.48 mg/dL (H)).   Medical History: Past Medical History:  Diagnosis Date   Alcohol abuse    Atrial fibrillation (HCC)    CHF (congestive heart failure) (HCC)    CKD (chronic kidney disease) stage 3, GFR 30-59 ml/min (HCC)    Hyperlipidemia    Hypertension    Noncompliance     Assessment: Pt was admitted for new CVA. Hx of AF but not on anticoagulation. He got thrombectomy on 9/18. Hgb wnl, Plt 148.  Heparin level 0.30 this morning  Goal of Therapy:  Heparin level 0.3-0.5 units/ml Monitor platelets by anticoagulation protocol: Yes   Plan:  Increase heparin to 1200 units / hr Follow up 8 hour heparin level  Thank you Greta Doom BS, PharmD, BCPS Clinical Pharmacist 05/03/2023 8:04 AM  Contact: [phone] after 3 PM  "Be curious, not judgmental..." -Debbora Dus

## 2023-05-03 NOTE — Progress Notes (Signed)
ANTICOAGULATION CONSULT NOTE   Pharmacy Consult for heparin Indication: atrial fibrillation and stroke  No Known Allergies  Patient Measurements: Height: 5\' 7"  (170.2 cm) Weight: 69.5 kg (153 lb 3.5 oz) IBW/kg (Calculated) : 66.1 Heparin Dosing Weight: 68 kg  Vital Signs: Temp: 98.2 F (36.8 C) (09/30 1633) BP: 164/79 (09/30 1633) Pulse Rate: 58 (09/30 1633)  Labs: Recent Labs    05/02/23 1112 05/02/23 1407 05/03/23 0647 05/03/23 1802  HGB 13.9  --  13.5  --   HCT 41.5  --  41.4  --   PLT 165  --  148*  --   HEPARINUNFRC  --  0.18* 0.30 0.37  CREATININE  --   --  1.48*  --     Estimated Creatinine Clearance: 44 mL/min (A) (by C-G formula based on SCr of 1.48 mg/dL (H)).   Medical History: Past Medical History:  Diagnosis Date   Alcohol abuse    Atrial fibrillation (HCC)    CHF (congestive heart failure) (HCC)    CKD (chronic kidney disease) stage 3, GFR 30-59 ml/min (HCC)    Hyperlipidemia    Hypertension    Noncompliance     Assessment: Pt was admitted for new CVA. Hx of AF but not on anticoagulation. He got thrombectomy on 9/18. Hgb wnl, Plt 148.  9/30 PM update: confirmatory heparin level therapeutic at 0.37 with heparin running at 1200 units/hour (slight increase from previous 1150 unit/hr rate). No signs of bleeding or issues with heparin infusion noted.   Goal of Therapy:  Heparin level 0.3-0.5 units/ml Monitor platelets by anticoagulation protocol: Yes   Plan:  Continue heparin at 1200 units / hr HL and CBC daily  Monitor s/sx of bleeding   Jani Gravel, PharmD Clinical Pharmacist  05/03/2023 7:13 PM

## 2023-05-04 ENCOUNTER — Inpatient Hospital Stay (HOSPITAL_COMMUNITY): Payer: Medicare PPO

## 2023-05-04 DIAGNOSIS — I63512 Cerebral infarction due to unspecified occlusion or stenosis of left middle cerebral artery: Secondary | ICD-10-CM | POA: Diagnosis not present

## 2023-05-04 LAB — HEPARIN LEVEL (UNFRACTIONATED): Heparin Unfractionated: 0.48 [IU]/mL (ref 0.30–0.70)

## 2023-05-04 LAB — GLUCOSE, CAPILLARY
Glucose-Capillary: 116 mg/dL — ABNORMAL HIGH (ref 70–99)
Glucose-Capillary: 119 mg/dL — ABNORMAL HIGH (ref 70–99)
Glucose-Capillary: 124 mg/dL — ABNORMAL HIGH (ref 70–99)
Glucose-Capillary: 125 mg/dL — ABNORMAL HIGH (ref 70–99)
Glucose-Capillary: 149 mg/dL — ABNORMAL HIGH (ref 70–99)
Glucose-Capillary: 171 mg/dL — ABNORMAL HIGH (ref 70–99)
Glucose-Capillary: 90 mg/dL (ref 70–99)

## 2023-05-04 LAB — BASIC METABOLIC PANEL
Anion gap: 9 (ref 5–15)
BUN: 34 mg/dL — ABNORMAL HIGH (ref 8–23)
CO2: 26 mmol/L (ref 22–32)
Calcium: 9.7 mg/dL (ref 8.9–10.3)
Chloride: 101 mmol/L (ref 98–111)
Creatinine, Ser: 1.54 mg/dL — ABNORMAL HIGH (ref 0.61–1.24)
GFR, Estimated: 49 mL/min — ABNORMAL LOW (ref >60)
Glucose, Bld: 166 mg/dL — ABNORMAL HIGH (ref 70–99)
Potassium: 4.9 mmol/L (ref 3.5–5.1)
Sodium: 136 mmol/L (ref 135–145)

## 2023-05-04 MED ORDER — ISOSORBIDE DINITRATE 10 MG PO TABS
20.0000 mg | ORAL_TABLET | Freq: Three times a day (TID) | ORAL | Status: DC
  Administered 2023-05-04 – 2023-05-19 (×46): 20 mg via ORAL
  Filled 2023-05-04 (×48): qty 2

## 2023-05-04 MED ORDER — NEPRO/CARBSTEADY PO LIQD
237.0000 mL | Freq: Two times a day (BID) | ORAL | Status: DC
  Administered 2023-05-05 – 2023-05-19 (×12): 237 mL via ORAL

## 2023-05-04 MED ORDER — OSMOLITE 1.5 CAL PO LIQD
980.0000 mL | ORAL | Status: DC
  Administered 2023-05-04 – 2023-05-08 (×5): 980 mL
  Filled 2023-05-04 (×2): qty 1000

## 2023-05-04 MED ORDER — ADULT MULTIVITAMIN W/MINERALS CH
1.0000 | ORAL_TABLET | Freq: Every day | ORAL | Status: DC
  Administered 2023-05-05 – 2023-05-19 (×15): 1 via ORAL
  Filled 2023-05-04 (×15): qty 1

## 2023-05-04 MED ORDER — APIXABAN 5 MG PO TABS
5.0000 mg | ORAL_TABLET | Freq: Two times a day (BID) | ORAL | Status: DC
  Administered 2023-05-04: 5 mg
  Filled 2023-05-04: qty 1

## 2023-05-04 MED ORDER — FOLIC ACID 1 MG PO TABS
1.0000 mg | ORAL_TABLET | Freq: Every day | ORAL | Status: DC
  Administered 2023-05-05 – 2023-05-19 (×15): 1 mg via ORAL
  Filled 2023-05-04 (×15): qty 1

## 2023-05-04 MED ORDER — APIXABAN 5 MG PO TABS
5.0000 mg | ORAL_TABLET | Freq: Two times a day (BID) | ORAL | Status: DC
  Administered 2023-05-04 – 2023-05-14 (×20): 5 mg via ORAL
  Filled 2023-05-04 (×21): qty 1

## 2023-05-04 MED ORDER — PRAVASTATIN SODIUM 40 MG PO TABS
40.0000 mg | ORAL_TABLET | Freq: Every day | ORAL | Status: DC
  Administered 2023-05-05 – 2023-05-19 (×15): 40 mg via ORAL
  Filled 2023-05-04 (×15): qty 1

## 2023-05-04 MED ORDER — OSMOLITE 1.5 CAL PO LIQD: 1000.0000 mL | ORAL | Status: DC

## 2023-05-04 MED ORDER — THIAMINE MONONITRATE 100 MG PO TABS
100.0000 mg | ORAL_TABLET | Freq: Every day | ORAL | Status: DC
  Administered 2023-05-05 – 2023-05-19 (×15): 100 mg via ORAL
  Filled 2023-05-04 (×15): qty 1

## 2023-05-04 NOTE — Progress Notes (Signed)
PROGRESS NOTE   Subjective/Complaints:  Pt reports doing well with therapy- has MBSS scheduled for tomorrow- excited about this- explained to pt will likely need to eat pureed food first and then work up to "regular food"- pt is ok with this.   Denies pain LBM yesterday   ROS:  Limited by sleepiness Except HPI  Objective:   No results found. Recent Labs    05/02/23 1112 05/03/23 0647  WBC 5.0 3.8*  HGB 13.9 13.5  HCT 41.5 41.4  PLT 165 148*    Recent Labs    05/03/23 0647 05/04/23 0557  NA 136 136  K 4.8 4.9  CL 103 101  CO2 24 26  GLUCOSE 132* 166*  BUN 29* 34*  CREATININE 1.48* 1.54*  CALCIUM 9.3 9.7    Intake/Output Summary (Last 24 hours) at 05/04/2023 0833 Last data filed at 05/04/2023 0648 Gross per 24 hour  Intake --  Output 765 ml  Net -765 ml        Physical Exam: Vital Signs Blood pressure 124/73, pulse (!) 58, temperature 98.5 F (36.9 C), temperature source Oral, resp. rate 18, height 5\' 7"  (1.702 m), weight 69.5 kg, SpO2 99%.   General: awake, alert, appropriate, sitting up in bed; wife at bedside; NAD HENT: conjugate gaze; oropharynx dry- cortrak in place CV: regular rhythm, slightly bradycardic  rate; no JVD Pulmonary: CTA B/L; no W/R/R- good air movement GI: soft, NT, ND, (+)normoactive Psychiatric: appropriate-= slightly more interactive Neurological: alert   Oriented to person place but not time RUE: 4-/5 Deltoid, 4-/5 Biceps, 4-/5 Triceps, 4-/5 Grip LUE: 5/5 Deltoid, 5/5 Biceps, 5/5 Triceps,  5/5 Grip RLE: HF 4/5, KE 4+/5, ADF 4+/5, APF 4+/5 LLE: HF 5/5, KE 5/5, ADF 5/5, APF 5/5 Incision intact light touch in all 4 extremities Finger-nose and intact on the left, altered on the right in proportion to his weakness Musculoskeletal:  Normal bulk, no hypertonia noted No joint swelling noted Right upper extremity fifth digit contracture-wife reports this is chronic    Assessment/Plan: 1. Functional deficits which require 3+ hours per day of interdisciplinary therapy in a comprehensive inpatient rehab setting. Physiatrist is providing close team supervision and 24 hour management of active medical problems listed below. Physiatrist and rehab team continue to assess barriers to discharge/monitor patient progress toward functional and medical goals  Care Tool:  Bathing    Body parts bathed by patient: Right arm, Chest, Abdomen, Front perineal area, Buttocks, Right upper leg, Left upper leg, Face   Body parts bathed by helper: Left arm, Right lower leg, Left lower leg     Bathing assist Assist Level: Moderate Assistance - Patient 50 - 74%     Upper Body Dressing/Undressing Upper body dressing   What is the patient wearing?: Pull over shirt    Upper body assist Assist Level: Moderate Assistance - Patient 50 - 74%    Lower Body Dressing/Undressing Lower body dressing      What is the patient wearing?: Incontinence brief, Pants     Lower body assist Assist for lower body dressing: Moderate Assistance - Patient 50 - 74%     Toileting Toileting    Toileting assist  Assist for toileting: Maximal Assistance - Patient 25 - 49%     Transfers Chair/bed transfer  Transfers assist     Chair/bed transfer assist level: Contact Guard/Touching assist     Locomotion Ambulation   Ambulation assist      Assist level: Contact Guard/Touching assist Assistive device: No Device Max distance: 150'   Walk 10 feet activity   Assist     Assist level: Contact Guard/Touching assist Assistive device: No Device   Walk 50 feet activity   Assist    Assist level: Contact Guard/Touching assist Assistive device: No Device    Walk 150 feet activity   Assist    Assist level: Contact Guard/Touching assist Assistive device: No Device    Walk 10 feet on uneven surface  activity   Assist     Assist level: Minimal Assistance -  Patient > 75%     Wheelchair     Assist Is the patient using a wheelchair?: Yes Type of Wheelchair: Manual    Wheelchair assist level: Dependent - Patient 0% Max wheelchair distance: 150'    Wheelchair 50 feet with 2 turns activity    Assist        Assist Level: Dependent - Patient 0%   Wheelchair 150 feet activity     Assist      Assist Level: Dependent - Patient 0%   Blood pressure 124/73, pulse (!) 58, temperature 98.5 F (36.9 C), temperature source Oral, resp. rate 18, height 5\' 7"  (1.702 m), weight 69.5 kg, SpO2 99%.  Medical Problem List and Plan: 1. 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  Etiology suspected to be A-fib not on anticoagulation versus large vessel disease             -patient may shower             -ELOS/Goals: 10 to 14 days, supervision to min assist with PT, OT, SLP          Con't CIR PT, OT and SLP  Team conference today to determine length of stay  MBSS today-  2.  Antithrombotics: -DVT/anticoagulation:  Pharmaceutical: Heparin initiated for anticoagulation. PLAN TO TRANSITION TO ELIQUIS 5 MG BID ONCE ABLE TO SWALLOW. 9/28 still NPO 10/1- per pharmacy change change to Eliquis- stopped ASA and Heparin and added Eliquis - but then realized wasn't clear- called pharmacy again- will reach out to Dr Pearlean Brownie and see if need ASA at all?             -antiplatelet therapy: Aspirin 325 mg daily 3. Pain Management: tylenol as needed  9/30- denies pain- con't regimen 4. Mood/Behavior/Sleep: Provide emotional support             -antipsychotic agents: N/A 5. Neuropsych/cognition: This patient is not capable of making decisions on his own behalf. 6. Skin/Wound Care: Routine skin checks 7. Fluids/Electrolytes/Nutrition: Routine and analysis with follow-up chemistries 8.  Dysphagia.  Currently NPO, Cortrak. Speech therapy follow-up.  9/27- pulled cortrak accidentally last  night- out a few inches- per nursing- KUB looks OK- reviewed it- can use- mittens? Recheck modified barium swallow on Tuesday, October 1  10/1- pending MBSS 9.  Hypertension.  Imdur 60 mg daily.  Monitor with increased mobility.  Long-term blood pressure goal normotensive  9/26- will change Imdur to immediate release- d/w pharmacy- changed  9/30- HR slightly bradycardic- will monitor Vitals:   05/03/23 2001 05/04/23 0401  BP: 127/83 124/73  Pulse: Marland Kitchen)  56 (!) 58  Resp: 18 18  Temp: 98.4 F (36.9 C) 98.5 F (36.9 C)  SpO2: 96% 99%    10.  Bradycardia into the 40s.  Coreg held.  Avoid negative chronotropic medications.   Monitor with increased mobility  9/30-10/1- doing slightly better- in 50s- not 40's 11.  Hyperlipidemia.  Pravachol 12.  CKD stage III.  Follow-up chemistries  9/30- Cr 1.48- up from 1.30- BUN up to 29- will increase Free water to 250 cc q4 - don't want to increase further due to [facility]-- and see how goes in labs tomorrow-   10/1- BUN up to 34 and Cr 1.54 up from 1.48- will consult nutritionist  for free water changes-     Latest Ref Rng & Units 05/04/2023    5:57 AM 05/03/2023    6:47 AM 04/30/2023    5:40 AM  BMP  Glucose 70 - 99 mg/dL 562  130  865   BUN 8 - 23 mg/dL 34  29  24   Creatinine 0.61 - 1.24 mg/dL 7.84  6.96  2.95   Sodium 135 - 145 mmol/L 136  136  133   Potassium 3.5 - 5.1 mmol/L 4.9  4.8  4.2   Chloride 98 - 111 mmol/L 101  103  100   CO2 22 - 32 mmol/L 26  24  28    Calcium 8.9 - 10.3 mg/dL 9.7  9.3  8.6     13.  Diastolic congestive heart failure.  Monitor for any signs of fluid overload. 9/28- stable   9/30- weight stable- 10/1- no weight today- will d/w nursing  [facility] Weights   05/01/23 0537 05/02/23 0500 05/03/23 0451  Weight: 69.8 kg 69.7 kg 69.5 kg    14.  History of tobacco/alcohol use/Cocaine.  Provide counseling 15.  Medical noncompliance.  Counseling 16.  Atrial fibrillation.  Hold Coreg due to bradycardia.  Continue ASA and heparin  until able to swallow and can switch to Eliquis  10/1- changed to Eliquis per Pharmacy- can do- can be crushed- stopped ASA and heparin   17. Constipation  9/26- will give sorbitol after therapy today  9/27- no results with sorbitol- will give another Sorbitol but 60cc and then soap suds enema if no results  10/1- cannot tell when having BM- has no control per wife- last went yesterday 18. Hyperkalemia  9/26- will recheck in AM- is 5.3- however was 4.1- so hesitant to give Lokelma without rechecking 9/27 - K+ 4.2- so likely hemolysis yesterday  19. Diabetes- on Tfs  9/26- will add SSI, since A1c 6.9 and on Tf's- do q4 hours per protocol.   9/30-10/1 CBGs running well- might need to change if gets diet with MBSS today CBG (last 3)  Recent Labs    05/03/23 2043 05/04/23 0009 05/04/23 0403  GLUCAP 120* 124* 119*      I spent a total of 56   minutes on total care today- >50% coordination of care- due to  D/w pharmacy about changing to Eliquis- allowed since can be crushed- per d/w pharmacy- also team conference to determine length of stay- d/w pharmacy x2- second time about ASA- - also placed consult for dietitian due to rising BUN/Cr.    LOS: 6 days A FACE TO FACE EVALUATION WAS PERFORMED  Sheriece Jefcoat 05/04/2023, 8:33 AM

## 2023-05-04 NOTE — Patient Care Conference (Signed)
Inpatient RehabilitationTeam Conference and Plan of Care Update Date: 05/04/2023   Time: 11:33 AM    Patient Name: Timothy [name]      Medical Record Number: [id number]  Date of Birth: 08-31-1955 Sex: Male         Room/Bed: 4W03C/4W03C-01 Payor Info: Payor: [employer] MEDICARE / Plan: [employer] MEDICARE CHOICE PPO / Product Type: *No Product type* /    Admit Date/Time:  04/28/2023  3:46 PM  Primary Diagnosis:  Left middle cerebral artery stroke [facility])  Hospital Problems: Principal Problem:   Left middle cerebral artery stroke (HCC) Active Problems:   Hx of atrial flutter   Chronic systolic heart failure (HCC)   Chronic kidney disease, stage 3a (HCC)   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 Genice Rouge Social Worker Present: Dossie Der, LCSW Nurse Present: Chana Bode, RN PT Present: [name], PT OT Present: Velia Meyer, OT SLP Present: Feliberto Gottron, SLP PPS Coordinator present : Edson Snowball, PT     Current Status/Progress Goal Weekly Team Focus  Bowel/Bladder   Pt cureently continent with B/B with some episodes of bowel incontinence.  LBM 05/03/23   Pt will regain full B/B continence with normal pattern   Assist pt with toileting needs 3-4 hours qshift/prn    Swallow/Nutrition/ Hydration   NPO, scheduled for repeated MBS 05/04/23   initiate diet, use of safe swallowing strategies with minA  complete repeated MBS, initiate diet, implementation of swallowing strategies    ADL's   Min A toileting, Min A LB dressing, Min A UB dressing d/t continuous lines and cortrak, CGA transfers overall with no AD, Min A dynamic standing during ADLs, improving FMC with Max VC for RUE coordination   SBA overall   RUE FMC and  hemi dressing technique, barriers to DC-cortrak, incontinence,  and cognition    Mobility   CGA/min all mobility, ambulating >300' without AD and completing x12 6" steps with BHRs   supervision  NMR, AFO consult?    Communication   mod-maxA for yes/no question accuracy depending on topic, maxA for word finding, maxA for auditory comprehension, minA for basic one-step command following for functional environmental commands   minA   increase yes/no accuracy, word finding, auditory comprehension, command following    Safety/Cognition/ Behavioral Observations  Supervision-Min A   Supervision   sustained attention to structured and informal tasks    Pain   Pt denies pain at this time. Verbalizes 0/10 on a pain scale   Will be free from pain and will verbalize pain as needed   Assess pt for pain qshift/prn and provide education on pain medication.    Skin   Skin warn and dry to touch with no breakdown   Pt will maintain skin intergrity with no breakdown  Assess skin qshift/prn for breakdown and provide education to prevent skin breakdown      Discharge Planning:  HOme with wife who has health issues-prior CVA uses rollator/cane. Other family members to assist. Pt hopeful he does well on his MBS today. Neuro-psych seeing   Team Discussion: Patient with bradycardia; monitoring and stable diabetes.  On Eliquis; weaning off heparin and off ASA. Encourage fluids.  Patient on target to meet rehab goals: yes, currently needs CGA for transfers without an assistive device and CGA - min  assist for steps without rails.  Needs min assist for toileting and lower body dressing. Also nod- max assist for yes/no accuracy and word finding. Goals for discharge set for supervision overall.  *See Care Plan and progress notes for long and short-term goals.   Revisions to Treatment Plan:  Neuropsych consult   Teaching Needs: Safety, medications, dietary modifications, bowel program/toileting,  transfers, etc.   Current Barriers to Discharge: Home enviroment access/layout and Incontinence  Possible Resolutions to Barriers: Family education HH follow up services [id number]     Medical Summary Current Status: conitnent usually-  but doesn't know when needs ot have BM- no skiin issues- sleeping well- has cortrak  Barriers to Discharge: Renal Insufficiency/Failure;Medical stability;Behavior/Mood;Electrolyte abnormality;Inadequate Nutritional Intake;Incontinence;Neurogenic Bowel & Bladder  Barriers to Discharge Comments: limited by mood- neuropsych to see pt-swallowing issues- no control of bowels-Has cortrak- now on D3 nectart thick diet as of today; max A word finding-apraxia/aphasia/higher level cognitive issues Possible Resolutions to [employer] Focus: SLP therapy; and new diet- will see if needs bowel program for incontinence- d/c 10/10   Continued Need for Acute Rehabilitation Level of Care: The patient requires daily medical management by a physician with specialized training in physical medicine and rehabilitation for the following reasons: Direction of a multidisciplinary physical rehabilitation program to maximize functional independence : Yes Medical management of patient stability for increased activity during participation in an intensive rehabilitation regime.: Yes Analysis of laboratory values and/or radiology reports with any subsequent need for medication adjustment and/or medical intervention. : Yes   I attest that I was present, lead the team conference, and concur with the assessment and plan of the team.   Chana Bode B 05/04/2023, 1:57 PM

## 2023-05-04 NOTE — Progress Notes (Signed)
Occupational Therapy Session Note  Patient Details  Name: Timothy [name] MRN: [id number] Date of Birth: 1955/10/06  Today's Date: 05/04/2023 OT Individual Time: 4098-1191 OT Individual Time Calculation (min): 42 min    Short Term Goals: Week 1:  OT Short Term Goal 1 (Week 1): Pt will don LB clothing with contact guard with extra itme. OT Short Term Goal 2 (Week 1): Pt will perform grooming task in standing with supervision OT Short Term Goal 3 (Week 1): Pt will perform toileting with min A  3/3 tasks OT Short Term Goal 4 (Week 1): Pt will use right hand at diminsted level  Skilled Therapeutic Interventions/Progress Updates:      Therapy Documentation Precautions:  Precautions Precautions: Fall Precaution Comments: R inattention; SBP <180, cortrak Restrictions Weight Bearing Restrictions: No General: "Hi Brittani Purdum!" Pt seated in W/C upon OT arrival, agreeable to OT.  Pain: no pain reported  Exercises: Pt participated in [facility] activity with RUE for NMRE, pt retrieving long colored pegs from OT hand for just right challenge with RUE and occasional hand over hand assist and placing them onto vertical peg board for increased dexterity and coordination in order to complete ADLs such as buttoning shirts. Pt completed tasks with PRN rest breaks d/t increased fatigue in RUE. Mod VC and tactile cueing for hand positioning d/t ataxia and incoordination. Pt then removed pegs with increased difficulty d/t increased fatigue of RUE.   Pt seated in W/C at end of session with W/C alarm donned, call light within reach and 4Ps assessed.    Therapy/Group: Individual Therapy  Velia Meyer, OTD, OTR/L 05/04/2023, 4:21 PM

## 2023-05-04 NOTE — Progress Notes (Signed)
ANTICOAGULATION CONSULT NOTE   Pharmacy Consult for heparin Indication: atrial fibrillation and stroke  No Known Allergies  Patient Measurements: Height: 5\' 7"  (170.2 cm) Weight: 69.5 kg (153 lb 3.5 oz) IBW/kg (Calculated) : 66.1 Heparin Dosing Weight: 68 kg  Vital Signs: Temp: 98.5 F (36.9 C) (10/01 0401) Temp Source: Oral (10/01 0401) BP: 124/73 (10/01 0401) Pulse Rate: 58 (10/01 0401)  Labs: Recent Labs    05/02/23 1112 05/02/23 1407 05/03/23 0647 05/03/23 1802 05/04/23 0557  HGB 13.9  --  13.5  --   --   HCT 41.5  --  41.4  --   --   PLT 165  --  148*  --   --   HEPARINUNFRC  --    < > 0.30 0.37 0.48  CREATININE  --   --  1.48*  --  1.54*   < > = values in this interval not displayed.    Estimated Creatinine Clearance: 42.3 mL/min (A) (by C-G formula based on SCr of 1.54 mg/dL (H)).   Medical History: Past Medical History:  Diagnosis Date   Alcohol abuse    Atrial fibrillation (HCC)    CHF (congestive heart failure) (HCC)    CKD (chronic kidney disease) stage 3, GFR 30-59 ml/min (HCC)    Hyperlipidemia    Hypertension    Noncompliance     Assessment: Pt was admitted for new CVA. Hx of AF but not on anticoagulation. He got thrombectomy on 9/18. Hgb wnl, Plt 148.  10/1 AM update: heparin level therapeutic at 0.48 with heparin running at 1200 units/hour. No signs of bleeding or issues with heparin infusion noted.   Goal of Therapy:  Heparin level 0.3-0.5 units/ml Monitor platelets by anticoagulation protocol: Yes   Plan:  Discontinue heparin per MD Start apixaban 5 mg per tube (switch to PO when able to swallow based on SLP eval) bid- per MD Monitor s/sx of bleeding   Greta Doom BS, PharmD, BCPS Clinical Pharmacist 05/04/2023 7:33 AM  Contact: [phone] after 3 PM  "Be curious, not judgmental..." -Debbora Dus

## 2023-05-04 NOTE — Progress Notes (Signed)
Patient ID: Timothy [name], male   DOB: 1955-09-09, 67 y.o.   MRN: [id number]  Met with pt and wife was present in his room to give team conference update regarding goals of SBA-CGA and target discharge date of 10/10. He is pleased he passed his swallowing test and can eat now. He will do well so his cortrak can be discharged. Discussed home health follow up and will await equipment recommendations. Neuro-psych to see pt to assist with coping.

## 2023-05-04 NOTE — Progress Notes (Addendum)
Initial Nutrition Assessment  DOCUMENTATION CODES:   Severe malnutrition in context of acute illness/injury  INTERVENTION:   Change to nocturnal feeds of:  Osmolite 1.5@70ml /hr x 14 hrs (from 1800-0800)  ProSource TF 20- Give 60ml daily via tube, each supplement provides 80kcal and 20g of protein.   Free water flushes q4 hours per MD  Regimen provides 1550kcal/day (meets 70% of estimated needs), 82g/day protein (meets 75% of estimated needs) and 2224ml/day of free water.   Nepro Shake po BID, each supplement provides 425 kcal and 19 grams protein  Magic cup TID with meals, each supplement provides 290 kcal and 9 grams of protein  MVI po daily   NUTRITION DIAGNOSIS:   Severe Malnutrition related to acute illness as evidenced by moderate fat depletion, moderate muscle depletion, severe muscle depletion, percent weight loss (hernia repair in august with poor intake post op followed by admission for stroke). -ongoing  GOAL:   Patient will meet greater than or equal to 90% of their needs -met with tube feeds   MONITOR:   PO intake, Supplement acceptance, Labs, Weight trends, TF tolerance, I & O's, Skin  ASSESSMENT:   67 y/o male with h/o CKD III, CHF, HLD, etoh abuse, GERD, inguinal hernia s/p repair with mesh 12/23 and s/p robotic assisted laparoscopic left inguinal herniorrhaphy with mesh 8/24 who is admitted with CIR after left MCA infarct with L M2 occlusion s/p thrombectomy on 9/18.  Pt s/p cortrak tube placement 9/20 (gastric)  Received consult from MD to assist with worsening AKI. Pt continues on tube feeds via cortrak tube at goal rate. Free water increased by MD. Pt s/p MBSS today and was placed on a mechanical soft/nectar thick diet. Per RN report, pt ate 75% of his lunch today which included mashed potatoes and meatloaf. Spoke with MD via phone. Will change pt to nocturnal tube feeds and meet 75% of his estimated needs to allow for increased oral intake during  the day. RD will add supplements to help pt meet his estimated needs. If patient continues to have good oral intake and drinks supplements, will plan for calorie count and will adjust tube feeds accordingly. Would not recommend additional free water at this time; would recommend minimal free water flushes and addition of IVF if worsening AKI. Refeed labs last checked 9/22; will recheck with next lab collection on 10/7. Pt with CHF history; per chart, pt appears weight stable for the past week.   Medications reviewed and include: folic acid, insulin, MVI, thiamine  Labs reviewed: Na 136 wnl, K 4.9 wnl, BUN 34(H), creat 1.54(H) P 3.4 wnl, Mg 2.1 wnl- 9/22 Wbc- 3.8(L)- 9/30 cbgs- 149, 116, 119, 124 X 24 hrs   Diet Order:   Diet Order             DIET DYS 3 Room service appropriate? Yes; Fluid consistency: Nectar Thick  Diet effective now                  EDUCATION NEEDS:   Education needs have been addressed  Skin:  Skin Assessment: Reviewed RN Assessment  Last BM:  10/1- type 3  Height:   Ht Readings from Last 1 Encounters:  04/28/23 5\' 7"  (1.702 m)    Weight:   Wt Readings from Last 1 Encounters:  05/03/23 69.5 kg   BMI:  Body mass index is 24 kg/m.  Estimated Nutritional Needs:   Kcal:  1900-2200kcal/day  Protein:  95-110g/day  Fluid:  1.7-2.0L/day  Betsey Holiday  MS, RD, LDN Please refer to [facility] for RD and/or RD on-call/weekend/after hours pager

## 2023-05-04 NOTE — Procedures (Signed)
Modified Barium Swallow Study  Patient Details  Name: Timothy [name] MRN: [id number] Date of Birth: [birth year]  Today's Date: 05/04/2023  Modified Barium Swallow completed.  Full report located under Chart Review in the Imaging Section.  History of Present Illness Patient is 67 y.o. 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 Pt s/p revascularization of the of the superior branch of the right MCA with mechanical thrombectomy achieving TICI 2C revascularization. PMH: ETOH abuse, CHF, CKD, HTN. MBS completed 04/26/23 revealed: "oropharyngeal dysphagia includ(ing) timing, motor, and sensory components. He has reduced lingual control with disorganized posterior transit although minimal oral residue. There is anterior loss out of the R side of his mouth though. He has reduced hyolaryngeal movement and laryngeal vestibule closure, and when swallow is mistimed, he penetrates during the swallow across consistencies. Penetration also occurred occasionally from intermittent vallecular residue. Pt's volitional cough is weak and ineffective at clearing penetrates, which continue to fall over time until aspiration occurs. Aspiration for the most part is silent, although spontaneous coughing is also not effective."   Clinical Impression Pt presents with oropharyngeal dysphagia primarily characterized by cognitively reduced oral bolus control resulting in posterior spillage prior to swallow initiation. Delay in swallow initiation lasts between 1-3 seconds with liquids boluses and 8-10 seconds with regular solids. With cup sips of thin liquids, swallow initiation is at the level of the pyriform sinuses with bolus material entering the laryngeal vestibule and dipping below the vocal folds (PAS 6) x2, then spontaneously ejected from the subglottal space with completion of the swallow (no cough observed). Once ejected from subglottal space, residue remained sitting  on top of vocal folds. Throughout trials of nectar thick liquids and then again with Dys3 solids, thin liquid residue from vestibule was eventually silently aspirated. With NTL, penetration about the level of the vocal folds observed, however this penetration was transient and cleared with completion of the swallow consistently. Notably, liquid residue observed diffusely throughout pharynx post-swallow, however suspect residue is primarily d/t placement of NG tube as residue was concentrated in point of constriction that are partially obstructed by tube presence. Recommend initiation of Dys3/NTL diet with pills administered whole in puree. SLP will initiate water protocol for hydration with rules posted in room for nursing staff. SLP will trial Provale cup with thin liquids at next session given patient's overall tolerance of small thin liquids boluses from teaspoon.  Factors that may increase risk of adverse event in presence of aspiration [name] & [name] 2021): Reduced cognitive function;Weak cough;Presence of tubes (ETT, trach, NG, etc.)  Swallow Evaluation Recommendations Recommendations: PO diet PO Diet Recommendation: Dysphagia 3 (Mechanical soft);Mildly thick liquids (Level 2, nectar thick) Liquid Administration via: Cup;Straw Medication Administration: Whole meds with puree Supervision: Full supervision/cueing for swallowing strategies;Patient able to self-feed Postural changes: Position pt fully upright for meals Oral care recommendations: Oral care QID (4x/day) Caregiver Recommendations: Have oral suction available  [name], M.A., CCC-SLP  [name] 05/04/2023,10:51 AM

## 2023-05-04 NOTE — Progress Notes (Signed)
Physical Therapy Session Note  Patient Details  Name: Timothy [name] MRN: [id number] Date of Birth: 1955/08/16  Today's Date: 05/04/2023 PT Individual Time: 4696-2952, 8413-2440 PT Individual Time Calculation (min): 41 min, 43 min   Short Term Goals: Week 1:  PT Short Term Goal 1 (Week 1): Pt will complete sit to stand with CGA consistently PT Short Term Goal 2 (Week 1): Pt wil l complete bed to chair with CGA consistently. PT Short Term Goal 3 (Week 1): Pt willl ambulate x150' with CGA PT Short Term Goal 4 (Week 1): Pt will complete x13 steps with BHRs and CGA.  Skilled Therapeutic Interventions/Progress Updates:      Treatment Session 1  Pt supine in bed upon arrival. Pt agreeable to therapy.Pt denies nay pain. Therpaist managing IV pole for tube feed. Nurse present at start of session to disconinue heparin drip and administer medications.   While nursing gathered all materails needed pt completed supine to sit with supervision. Pt performed sit to stand with no AD and min A, and standing marching x 10 B with verbal and tactile cues provided for technique.   Ambulation no AD room to main gym, vebral and tactile cues provided for reciprocal stepping R LE heel toe pattern, pt demos improved technique with max verbal cues. Verbal cues provided for reciprocal arm swing with gait.   Pt ascended/descended 12 6 inch steps with R HR and CGA with step to gait, progressing to reciprocal stepping with min A with no UE support for ascending and intermittent R UE support for descending, vebral cues provided for attention to R UE  Pt picked up cones positioned on R side of visual visual, with R UE, verbal cues provided for stepping closer to cone prior to picking it up, pt dropped cone 50% of the time, but able to successfully pick it up on 2nd attempt with verbal cues for technique with CGA/light min A.   Pt seated in WC at end of session with all needs within reach and seatbelt alarm on.    Treatment Session 2   Pt seated in WC upon arrival. Pt agreeable to therapy. Pt denies any pain.   Treatment Session focused on dynamic balance and challenging limits of stability.   Pt ambulated from room to ortho gym with no AD and CGA/sup. Pt demos improved reciprocal gait and R LE clearance/heel strike with mod cues.   Pt performed lateral stepping 2x35 feet B with no AD and CGA, vebral cues provided for technique and increased step length and for R LE foot clearance espically with L lateral stepping.   Pt ambulated backwards 2x35 feet with no AD and CGA, verbal cues provided for increased step length.   Pt ambulating while performing horizontal and vertical head turns, pt demos little to no deviation, but signifcantly reduces speed.   Pt ambulated with eyes closed and CGA, with deviation of 12-14 inches.   Pt performed tandem walking x 20 feet with min A.   Pt supine in bed at end of session with all needs within reach and seatbelt alarm on.     Therapy Documentation Precautions:  Precautions Precautions: Fall Precaution Comments: R inattention; SBP <180, cortrak Restrictions Weight Bearing Restrictions: No  Therapy/Group: Individual Therapy  [facility] Ambrose Finland, [place], DPT  05/04/2023, 7:46 AM

## 2023-05-04 NOTE — Progress Notes (Signed)
Physical Therapy Session Note  Patient Details  Name: Timothy [name] MRN: [id number] Date of Birth: 02-Nov-1955  Today's Date: 05/04/2023 PT Individual Time: 4098-1191 PT Individual Time Calculation (min): 42 min   Short Term Goals: Week 1:  PT Short Term Goal 1 (Week 1): Pt will complete sit to stand with CGA consistently PT Short Term Goal 2 (Week 1): Pt wil l complete bed to chair with CGA consistently. PT Short Term Goal 3 (Week 1): Pt willl ambulate x150' with CGA PT Short Term Goal 4 (Week 1): Pt will complete x13 steps with BHRs and CGA.  Skilled Therapeutic Interventions/Progress Updates:      Therapy Documentation Precautions:  Precautions Precautions: Fall Precaution Comments: R inattention; SBP <180, cortrak Restrictions Weight Bearing Restrictions: No  Pt agreeable to PT and declines pain in session. Pt supervision with bed mobility from flat surface and min A for lower body dressing and requires mod verbal cues for incorporation of R UE.   Pt CGA with gait >300 ft without AD with min cues for pacing. Pt supervision with min cues for car transfer and CGA for ramp navigation.   Pt transitioned to stair training and progressed from right hand rail to no rails with steps x 12 (6 inches) with largely CGA and one episode of min A for posterior loss of balance.   Pt returned to room by ambulation and incontinent of bowel, documented in flowsheet and requires close (S) for static standing balance as pt dependent for per-care.   Pt left semi-reclined in bed with all needs in reach and alarm on.     Therapy/Group: Individual Therapy  [name] [name] PT, DPT  05/04/2023, 7:44 AM

## 2023-05-05 DIAGNOSIS — I63512 Cerebral infarction due to unspecified occlusion or stenosis of left middle cerebral artery: Secondary | ICD-10-CM | POA: Diagnosis not present

## 2023-05-05 LAB — GLUCOSE, CAPILLARY
Glucose-Capillary: 153 mg/dL — ABNORMAL HIGH (ref 70–99)
Glucose-Capillary: 137 mg/dL — ABNORMAL HIGH (ref 70–99)
Glucose-Capillary: 117 mg/dL — ABNORMAL HIGH (ref 70–99)
Glucose-Capillary: 125 mg/dL — ABNORMAL HIGH (ref 70–99)
Glucose-Capillary: 158 mg/dL — ABNORMAL HIGH (ref 70–99)

## 2023-05-05 MED ORDER — INSULIN ASPART 100 UNIT/ML IJ SOLN
0.0000 [IU] | Freq: Three times a day (TID) | INTRAMUSCULAR | Status: DC
  Administered 2023-05-05: 1 [IU] via SUBCUTANEOUS
  Administered 2023-05-06: 2 [IU] via SUBCUTANEOUS
  Administered 2023-05-06 – 2023-05-07 (×3): 1 [IU] via SUBCUTANEOUS
  Administered 2023-05-08 – 2023-05-09 (×2): 2 [IU] via SUBCUTANEOUS
  Administered 2023-05-10: 1 [IU] via SUBCUTANEOUS
  Administered 2023-05-11 – 2023-05-12 (×2): 2 [IU] via SUBCUTANEOUS
  Administered 2023-05-13: 1 [IU] via SUBCUTANEOUS
  Administered 2023-05-13: 2 [IU] via SUBCUTANEOUS
  Administered 2023-05-14: 1 [IU] via SUBCUTANEOUS
  Administered 2023-05-14: 2 [IU] via SUBCUTANEOUS
  Administered 2023-05-15 – 2023-05-17 (×6): 1 [IU] via SUBCUTANEOUS
  Administered 2023-05-18 – 2023-05-19 (×2): 2 [IU] via SUBCUTANEOUS

## 2023-05-05 NOTE — Discharge Summary (Signed)
tube placement for chronic nutritional needs. EXAM: CT ABDOMEN WITHOUT CONTRAST TECHNIQUE: Multidetector CT imaging of the abdomen was performed following the standard protocol without IV contrast. RADIATION DOSE REDUCTION: This exam was performed according to the departmental dose-optimization program which includes automated exposure control, adjustment of the mA and/or kV according to patient size and/or use of iterative reconstruction technique. COMPARISON:  CT of the abdomen and pelvis on 07/16/2022 FINDINGS: Lower chest: No acute abnormality. Visible heavily calcified coronary arteries. Hepatobiliary: No focal liver abnormality is seen. No gallstones, gallbladder wall thickening, or biliary dilatation. Pancreas: Unremarkable. No pancreatic ductal dilatation or surrounding inflammatory changes. Spleen: No splenic injury or perisplenic hematoma. Adrenals/Urinary Tract: No adrenal masses. Stable bilateral simple Bosniak 1 renal cysts requiring no follow-up. No hydronephrosis or renal calculi. Stomach/Bowel: Feeding tube enters the stomach and terminates in the region of the antrum of the stomach. No hiatal hernia. The stomach is normally positioned and there is no interposition of colon or liver between the body of the stomach and the abdominal wall. No evidence of bowel obstruction or significant ileus. There is some barium in the colon, presumably related to recent modified barium swallow studies. No free intraperitoneal air. Vascular/Lymphatic: Atherosclerosis of the abdominal aorta without aneurysm. No lymphadenopathy identified. Other: No hernias, ascites or focal fluid collections.  No anasarca. Musculoskeletal: No acute or significant  osseous findings. IMPRESSION: 1. Feeding tube enters the stomach and terminates in the region of the antrum of the stomach. The stomach is normally positioned and there is no interposition of colon or liver between the body of the stomach and the abdominal wall. There is no anatomic contraindication to attempted percutaneous gastrostomy tube placement. 2. Coronary atherosclerosis. 3. Aortic atherosclerosis. Aortic Atherosclerosis (ICD10-I70.0). Electronically Signed   By: Irish Lack M.D.   On: 05/13/2023 13:43   DG Swallowing Func-Speech Pathology  Result Date: 05/13/2023 Table formatting from the original result was not included. Modified Barium Swallow Study Patient Details Name: Timothy [name] MRN: [id number] Date of Birth: 1956/06/04 Today's Date: 05/13/2023 HPI/PMH: HPI: Patient is 67 y.o. 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 Pt s/p revascularization of the of the superior branch of the right MCA with mechanical thrombectomy achieving TICI 2C revascularization. PMH: ETOH abuse, CHF, CKD, HTN. MBS completed 04/26/23 revealed: "oropharyngeal dysphagia includ(ing) timing, motor, and sensory components. He has reduced lingual control with disorganized posterior transit although minimal oral residue. There is anterior loss out of the R side of his mouth though. He has reduced hyolaryngeal movement and laryngeal vestibule closure, and when swallow is mistimed, he penetrates during the swallow across consistencies. Penetration also occurred occasionally from intermittent vallecular residue. Pt's volitional cough is weak and ineffective at clearing penetrates, which continue to fall over time until aspiration occurs. Aspiration for the most part is silent, although spontaneous coughing is also not effective." Repeated MBS 05/04/23 with subsequent upgrade to Dys1/NTL diet. Clinical Impression: Clinical Impression: Pt presents with oropharyngeal  dysphagia primarily characterized by reduced oral bolus hold resulting in posterior spillage into the pharynx and delay in swallow initiation lasting between 1-3 seconds at the level of the pyriform sinuses. Swallow delay/mistiming and pooling of bolus into the pyriforms prior to swallow initiation resulted in trace silent aspiration of 4/5 administered thin liquids boluses from teaspoon despite trials of bolus hold and chin tuck strategies. Residue in the pharynx improved from previous swallowing assessment. Penetration above the level of the vocal  not enter airway: Moderately thick liquids (Level 3, honey thick); Puree 2.  Material enters airway, remains ABOVE vocal cords then ejected out: Mildly thick liquids (Level 2, nectar thick) 8.  Material enters airway, passes BELOW cords without attempt by patient to eject out (silent aspiration) : Thin liquids (Level 0) Compensatory Strategies: Compensatory Strategies Compensatory strategies: Yes Chin tuck: Ineffective Ineffective Chin Tuck: Thin liquid (Level 0) Oral bolus hold: Ineffective Ineffective Oral Bolus Hold : Thin liquid (Level 0)   General Information: Caregiver present: No  Diet Prior to this Study: Dysphagia 1 (pureed); Mildly thick liquids (Level 2, nectar thick)   Temperature : Normal   Respiratory Status: WFL   Supplemental O2: None (Room air)   History  of Recent Intubation: Yes  Behavior/Cognition: Alert; Cooperative; Requires cueing; Pleasant mood Self-Feeding Abilities: Able to self-feed Baseline vocal quality/speech: Normal Volitional Cough: Able to elicit Volitional Swallow: Able to elicit Exam Limitations: No limitations Goal Planning: Prognosis for improved oropharyngeal function: Guarded Barriers to Reach Goals: Language deficits; Cognitive deficits Barriers/Prognosis Comment: severity of deficits Patient/Family Stated Goal: none stated Consulted and agree with results and recommendations: Patient Pain: Pain Assessment Pain Assessment: No/denies pain Faces Pain Scale: 0 End of Session: Start Time:No data recorded Stop Time: No data recorded Time Calculation:No data recorded Charges: No data recorded SLP visit diagnosis: SLP Visit Diagnosis: Dysphagia, oropharyngeal phase (R13.12) Past Medical History: Past Medical History: Diagnosis Date  Alcohol abuse   Atrial fibrillation (HCC)   CHF (congestive heart failure) (HCC)   CKD (chronic kidney disease) stage 3, GFR 30-59 ml/min (HCC)   Hyperlipidemia   Hypertension   Noncompliance  Past Surgical History: Past Surgical History: Procedure Laterality Date  INGUINAL HERNIA REPAIR Right 07/17/2022  Procedure: HERNIA REPAIR INGUINAL INCARCERATED;  Surgeon: Lucretia Roers, MD;  Location: [facility];  Service: General;  Laterality: Right;  IR CT HEAD LTD  04/21/2023  IR PERCUTANEOUS ART THROMBECTOMY/INFUSION INTRACRANIAL INC DIAG ANGIO  04/21/2023  RADIOLOGY WITH ANESTHESIA N/A 04/21/2023  Procedure: IR WITH ANESTHESIA;  Surgeon: Radiologist, Medication, MD;  Location: [facility];  Service: Radiology;  Laterality: N/A;  XI ROBOTIC ASSISTED INGUINAL HERNIA REPAIR WITH MESH Left 03/19/2023  Procedure: XI ROBOTIC ASSISTED INGUINAL HERNIA REPAIR WITH MESH;  Surgeon: Franky Macho, MD;  Location: [facility];  Service: General;  Laterality: Left; [name], M.A., CCC-SLP [name] 05/13/2023, 10:10 AM  DG Swallowing Func-Speech  Pathology  Result Date: 05/04/2023 Table formatting from the original result was not included. Modified Barium Swallow Study Patient Details Name: Timothy [name] MRN: [id number] Date of Birth: 06-21-1956 Today's Date: 05/04/2023 HPI/PMH: HPI: Patient is 67 y.o. 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 Pt s/p revascularization of the of the superior branch of the right MCA with mechanical thrombectomy achieving TICI 2C revascularization. PMH: ETOH abuse, CHF, CKD, HTN. MBS completed 04/26/23 revealed: "oropharyngeal dysphagia includ(ing) timing, motor, and sensory components. He has reduced lingual control with disorganized posterior transit although minimal oral residue. There is anterior loss out of the R side of his mouth though. He has reduced hyolaryngeal movement and laryngeal vestibule closure, and when swallow is mistimed, he penetrates during the swallow across consistencies. Penetration also occurred occasionally from intermittent vallecular residue. Pt's volitional cough is weak and ineffective at clearing penetrates, which continue to fall over time until aspiration occurs. Aspiration for the most part is silent, although spontaneous coughing is also not effective." Clinical Impression: Clinical  Physician Discharge Summary  Patient ID: Timothy [name] MRN: [id number] DOB/AGE: August 27, 1955 67 y.o.  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  CVA. EXAM: PERCUTANEOUS GASTROSTOMY TUBE PLACEMENT COMPARISON:  CT abdomen, 05/13/2023. FL swallow evaluation, 05/13/2023. MEDICATIONS: Ancef 2 gm IV; Antibiotics were administered within 1 hour of the procedure. CONTRAST:  15 mL of Isovue 300 administered into the gastric lumen. ANESTHESIA/SEDATION: Moderate (conscious) sedation was employed during this procedure. A total of Versed 2 mg and Fentanyl 100 mcg was administered intravenously. Moderate Sedation Time: 13 minutes. The patient's level of consciousness and vital signs were monitored continuously by radiology nursing throughout the procedure under my direct supervision. FLUOROSCOPY TIME:  Fluoroscopic dose; 2 mGy COMPLICATIONS: None immediate. PROCEDURE: Informed written consent was obtained from the patient and/or patient's representative following explanation of the procedure, risks, benefits and alternatives. A time out was performed prior to the initiation of the procedure. Maximal barrier sterile technique utilized including caps, mask, sterile gowns, sterile gloves, large sterile drape, hand hygiene and Betadine prep. The left upper quadrant was sterilely prepped and draped. A oral gastric catheter was inserted into the stomach under fluoroscopy. The existing  nasogastric feeding tube was removed. The left costal margin and barium opacified transverse colon were identified and avoided. Air was injected into the stomach for insufflation and visualization under fluoroscopy. Under sterile conditions and local anesthesia, 2T tacks were utilized to pexy the anterior aspect of the stomach against the ventral abdominal wall. Contrast injection confirmed appropriate positioning of each of the T tacks. An incision was made between the T tacks and a 17 gauge trocar needle was utilized to access the stomach. Needle position was confirmed within the stomach with aspiration of air and injection of a small amount of contrast. A stiff Glidewire was advanced into the gastric lumen and under intermittent fluoroscopic guidance, the access needle was exchanged for a telescoping peel-away sheath, ultimately allowing placement of an 18 Fr balloon retention gastrostomy tube. The retention balloon was insufflated with a mixture of dilute saline and contrast and pulled taut against the anterior wall of the stomach. The external disc was cinched. Contrast injection confirms positioning within the stomach. Several spot radiographic images were obtained in various obliquities for documentation. The patient tolerated procedure well without immediate post procedural complication. FINDINGS: After successful fluoroscopic guided placement, the gastrostomy tube is appropriately positioned with internal retention balloon against the ventral aspect of the gastric lumen. IMPRESSION: Successful fluoroscopic-guided percutaneous insertion of an 18 Fr balloon-retention gastrostomy tube. The gastrostomy may be used immediately for medication administration and in 4 hrs for the initiation of feeds. RECOMMENDATIONS: The patient will return to [facility] ([facility]) for routine feeding tube evaluation and exchange in 6 months. Roanna Banning, MD [facility]  [facility] Radiology Electronically Signed   By: Roanna Banning M.D.   On: 05/17/2023 16:50   CT ABDOMEN WO CONTRAST  Result Date: 05/13/2023 CLINICAL DATA:  Cerebral infarction, dysphagia, current nasal feeding tube and assessment for possible percutaneous gastrostomy  not enter airway: Moderately thick liquids (Level 3, honey thick); Puree 2.  Material enters airway, remains ABOVE vocal cords then ejected out: Mildly thick liquids (Level 2, nectar thick) 8.  Material enters airway, passes BELOW cords without attempt by patient to eject out (silent aspiration) : Thin liquids (Level 0) Compensatory Strategies: Compensatory Strategies Compensatory strategies: Yes Chin tuck: Ineffective Ineffective Chin Tuck: Thin liquid (Level 0) Oral bolus hold: Ineffective Ineffective Oral Bolus Hold : Thin liquid (Level 0)   General Information: Caregiver present: No  Diet Prior to this Study: Dysphagia 1 (pureed); Mildly thick liquids (Level 2, nectar thick)   Temperature : Normal   Respiratory Status: WFL   Supplemental O2: None (Room air)   History  of Recent Intubation: Yes  Behavior/Cognition: Alert; Cooperative; Requires cueing; Pleasant mood Self-Feeding Abilities: Able to self-feed Baseline vocal quality/speech: Normal Volitional Cough: Able to elicit Volitional Swallow: Able to elicit Exam Limitations: No limitations Goal Planning: Prognosis for improved oropharyngeal function: Guarded Barriers to Reach Goals: Language deficits; Cognitive deficits Barriers/Prognosis Comment: severity of deficits Patient/Family Stated Goal: none stated Consulted and agree with results and recommendations: Patient Pain: Pain Assessment Pain Assessment: No/denies pain Faces Pain Scale: 0 End of Session: Start Time:No data recorded Stop Time: No data recorded Time Calculation:No data recorded Charges: No data recorded SLP visit diagnosis: SLP Visit Diagnosis: Dysphagia, oropharyngeal phase (R13.12) Past Medical History: Past Medical History: Diagnosis Date  Alcohol abuse   Atrial fibrillation (HCC)   CHF (congestive heart failure) (HCC)   CKD (chronic kidney disease) stage 3, GFR 30-59 ml/min (HCC)   Hyperlipidemia   Hypertension   Noncompliance  Past Surgical History: Past Surgical History: Procedure Laterality Date  INGUINAL HERNIA REPAIR Right 07/17/2022  Procedure: HERNIA REPAIR INGUINAL INCARCERATED;  Surgeon: Lucretia Roers, MD;  Location: [facility];  Service: General;  Laterality: Right;  IR CT HEAD LTD  04/21/2023  IR PERCUTANEOUS ART THROMBECTOMY/INFUSION INTRACRANIAL INC DIAG ANGIO  04/21/2023  RADIOLOGY WITH ANESTHESIA N/A 04/21/2023  Procedure: IR WITH ANESTHESIA;  Surgeon: Radiologist, Medication, MD;  Location: [facility];  Service: Radiology;  Laterality: N/A;  XI ROBOTIC ASSISTED INGUINAL HERNIA REPAIR WITH MESH Left 03/19/2023  Procedure: XI ROBOTIC ASSISTED INGUINAL HERNIA REPAIR WITH MESH;  Surgeon: Franky Macho, MD;  Location: [facility];  Service: General;  Laterality: Left; [name], M.A., CCC-SLP [name] 05/13/2023, 10:10 AM  DG Swallowing Func-Speech  Pathology  Result Date: 05/04/2023 Table formatting from the original result was not included. Modified Barium Swallow Study Patient Details Name: Timothy [name] MRN: [id number] Date of Birth: 06-21-1956 Today's Date: 05/04/2023 HPI/PMH: HPI: Patient is 67 y.o. 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 Pt s/p revascularization of the of the superior branch of the right MCA with mechanical thrombectomy achieving TICI 2C revascularization. PMH: ETOH abuse, CHF, CKD, HTN. MBS completed 04/26/23 revealed: "oropharyngeal dysphagia includ(ing) timing, motor, and sensory components. He has reduced lingual control with disorganized posterior transit although minimal oral residue. There is anterior loss out of the R side of his mouth though. He has reduced hyolaryngeal movement and laryngeal vestibule closure, and when swallow is mistimed, he penetrates during the swallow across consistencies. Penetration also occurred occasionally from intermittent vallecular residue. Pt's volitional cough is weak and ineffective at clearing penetrates, which continue to fall over time until aspiration occurs. Aspiration for the most part is silent, although spontaneous coughing is also not effective." Clinical Impression: Clinical  not enter airway: Moderately thick liquids (Level 3, honey thick); Puree 2.  Material enters airway, remains ABOVE vocal cords then ejected out: Mildly thick liquids (Level 2, nectar thick) 8.  Material enters airway, passes BELOW cords without attempt by patient to eject out (silent aspiration) : Thin liquids (Level 0) Compensatory Strategies: Compensatory Strategies Compensatory strategies: Yes Chin tuck: Ineffective Ineffective Chin Tuck: Thin liquid (Level 0) Oral bolus hold: Ineffective Ineffective Oral Bolus Hold : Thin liquid (Level 0)   General Information: Caregiver present: No  Diet Prior to this Study: Dysphagia 1 (pureed); Mildly thick liquids (Level 2, nectar thick)   Temperature : Normal   Respiratory Status: WFL   Supplemental O2: None (Room air)   History  of Recent Intubation: Yes  Behavior/Cognition: Alert; Cooperative; Requires cueing; Pleasant mood Self-Feeding Abilities: Able to self-feed Baseline vocal quality/speech: Normal Volitional Cough: Able to elicit Volitional Swallow: Able to elicit Exam Limitations: No limitations Goal Planning: Prognosis for improved oropharyngeal function: Guarded Barriers to Reach Goals: Language deficits; Cognitive deficits Barriers/Prognosis Comment: severity of deficits Patient/Family Stated Goal: none stated Consulted and agree with results and recommendations: Patient Pain: Pain Assessment Pain Assessment: No/denies pain Faces Pain Scale: 0 End of Session: Start Time:No data recorded Stop Time: No data recorded Time Calculation:No data recorded Charges: No data recorded SLP visit diagnosis: SLP Visit Diagnosis: Dysphagia, oropharyngeal phase (R13.12) Past Medical History: Past Medical History: Diagnosis Date  Alcohol abuse   Atrial fibrillation (HCC)   CHF (congestive heart failure) (HCC)   CKD (chronic kidney disease) stage 3, GFR 30-59 ml/min (HCC)   Hyperlipidemia   Hypertension   Noncompliance  Past Surgical History: Past Surgical History: Procedure Laterality Date  INGUINAL HERNIA REPAIR Right 07/17/2022  Procedure: HERNIA REPAIR INGUINAL INCARCERATED;  Surgeon: Lucretia Roers, MD;  Location: [facility];  Service: General;  Laterality: Right;  IR CT HEAD LTD  04/21/2023  IR PERCUTANEOUS ART THROMBECTOMY/INFUSION INTRACRANIAL INC DIAG ANGIO  04/21/2023  RADIOLOGY WITH ANESTHESIA N/A 04/21/2023  Procedure: IR WITH ANESTHESIA;  Surgeon: Radiologist, Medication, MD;  Location: [facility];  Service: Radiology;  Laterality: N/A;  XI ROBOTIC ASSISTED INGUINAL HERNIA REPAIR WITH MESH Left 03/19/2023  Procedure: XI ROBOTIC ASSISTED INGUINAL HERNIA REPAIR WITH MESH;  Surgeon: Franky Macho, MD;  Location: [facility];  Service: General;  Laterality: Left; [name], M.A., CCC-SLP [name] 05/13/2023, 10:10 AM  DG Swallowing Func-Speech  Pathology  Result Date: 05/04/2023 Table formatting from the original result was not included. Modified Barium Swallow Study Patient Details Name: Timothy [name] MRN: [id number] Date of Birth: 06-21-1956 Today's Date: 05/04/2023 HPI/PMH: HPI: Patient is 67 y.o. 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 Pt s/p revascularization of the of the superior branch of the right MCA with mechanical thrombectomy achieving TICI 2C revascularization. PMH: ETOH abuse, CHF, CKD, HTN. MBS completed 04/26/23 revealed: "oropharyngeal dysphagia includ(ing) timing, motor, and sensory components. He has reduced lingual control with disorganized posterior transit although minimal oral residue. There is anterior loss out of the R side of his mouth though. He has reduced hyolaryngeal movement and laryngeal vestibule closure, and when swallow is mistimed, he penetrates during the swallow across consistencies. Penetration also occurred occasionally from intermittent vallecular residue. Pt's volitional cough is weak and ineffective at clearing penetrates, which continue to fall over time until aspiration occurs. Aspiration for the most part is silent, although spontaneous coughing is also not effective." Clinical Impression: Clinical  tube placement for chronic nutritional needs. EXAM: CT ABDOMEN WITHOUT CONTRAST TECHNIQUE: Multidetector CT imaging of the abdomen was performed following the standard protocol without IV contrast. RADIATION DOSE REDUCTION: This exam was performed according to the departmental dose-optimization program which includes automated exposure control, adjustment of the mA and/or kV according to patient size and/or use of iterative reconstruction technique. COMPARISON:  CT of the abdomen and pelvis on 07/16/2022 FINDINGS: Lower chest: No acute abnormality. Visible heavily calcified coronary arteries. Hepatobiliary: No focal liver abnormality is seen. No gallstones, gallbladder wall thickening, or biliary dilatation. Pancreas: Unremarkable. No pancreatic ductal dilatation or surrounding inflammatory changes. Spleen: No splenic injury or perisplenic hematoma. Adrenals/Urinary Tract: No adrenal masses. Stable bilateral simple Bosniak 1 renal cysts requiring no follow-up. No hydronephrosis or renal calculi. Stomach/Bowel: Feeding tube enters the stomach and terminates in the region of the antrum of the stomach. No hiatal hernia. The stomach is normally positioned and there is no interposition of colon or liver between the body of the stomach and the abdominal wall. No evidence of bowel obstruction or significant ileus. There is some barium in the colon, presumably related to recent modified barium swallow studies. No free intraperitoneal air. Vascular/Lymphatic: Atherosclerosis of the abdominal aorta without aneurysm. No lymphadenopathy identified. Other: No hernias, ascites or focal fluid collections.  No anasarca. Musculoskeletal: No acute or significant  osseous findings. IMPRESSION: 1. Feeding tube enters the stomach and terminates in the region of the antrum of the stomach. The stomach is normally positioned and there is no interposition of colon or liver between the body of the stomach and the abdominal wall. There is no anatomic contraindication to attempted percutaneous gastrostomy tube placement. 2. Coronary atherosclerosis. 3. Aortic atherosclerosis. Aortic Atherosclerosis (ICD10-I70.0). Electronically Signed   By: Irish Lack M.D.   On: 05/13/2023 13:43   DG Swallowing Func-Speech Pathology  Result Date: 05/13/2023 Table formatting from the original result was not included. Modified Barium Swallow Study Patient Details Name: Timothy [name] MRN: [id number] Date of Birth: 1956/06/04 Today's Date: 05/13/2023 HPI/PMH: HPI: Patient is 67 y.o. 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 Pt s/p revascularization of the of the superior branch of the right MCA with mechanical thrombectomy achieving TICI 2C revascularization. PMH: ETOH abuse, CHF, CKD, HTN. MBS completed 04/26/23 revealed: "oropharyngeal dysphagia includ(ing) timing, motor, and sensory components. He has reduced lingual control with disorganized posterior transit although minimal oral residue. There is anterior loss out of the R side of his mouth though. He has reduced hyolaryngeal movement and laryngeal vestibule closure, and when swallow is mistimed, he penetrates during the swallow across consistencies. Penetration also occurred occasionally from intermittent vallecular residue. Pt's volitional cough is weak and ineffective at clearing penetrates, which continue to fall over time until aspiration occurs. Aspiration for the most part is silent, although spontaneous coughing is also not effective." Repeated MBS 05/04/23 with subsequent upgrade to Dys1/NTL diet. Clinical Impression: Clinical Impression: Pt presents with oropharyngeal  dysphagia primarily characterized by reduced oral bolus hold resulting in posterior spillage into the pharynx and delay in swallow initiation lasting between 1-3 seconds at the level of the pyriform sinuses. Swallow delay/mistiming and pooling of bolus into the pyriforms prior to swallow initiation resulted in trace silent aspiration of 4/5 administered thin liquids boluses from teaspoon despite trials of bolus hold and chin tuck strategies. Residue in the pharynx improved from previous swallowing assessment. Penetration above the level of the vocal  Physician Discharge Summary  Patient ID: Timothy [name] MRN: [id number] DOB/AGE: August 27, 1955 67 y.o.  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  CVA. EXAM: PERCUTANEOUS GASTROSTOMY TUBE PLACEMENT COMPARISON:  CT abdomen, 05/13/2023. FL swallow evaluation, 05/13/2023. MEDICATIONS: Ancef 2 gm IV; Antibiotics were administered within 1 hour of the procedure. CONTRAST:  15 mL of Isovue 300 administered into the gastric lumen. ANESTHESIA/SEDATION: Moderate (conscious) sedation was employed during this procedure. A total of Versed 2 mg and Fentanyl 100 mcg was administered intravenously. Moderate Sedation Time: 13 minutes. The patient's level of consciousness and vital signs were monitored continuously by radiology nursing throughout the procedure under my direct supervision. FLUOROSCOPY TIME:  Fluoroscopic dose; 2 mGy COMPLICATIONS: None immediate. PROCEDURE: Informed written consent was obtained from the patient and/or patient's representative following explanation of the procedure, risks, benefits and alternatives. A time out was performed prior to the initiation of the procedure. Maximal barrier sterile technique utilized including caps, mask, sterile gowns, sterile gloves, large sterile drape, hand hygiene and Betadine prep. The left upper quadrant was sterilely prepped and draped. A oral gastric catheter was inserted into the stomach under fluoroscopy. The existing  nasogastric feeding tube was removed. The left costal margin and barium opacified transverse colon were identified and avoided. Air was injected into the stomach for insufflation and visualization under fluoroscopy. Under sterile conditions and local anesthesia, 2T tacks were utilized to pexy the anterior aspect of the stomach against the ventral abdominal wall. Contrast injection confirmed appropriate positioning of each of the T tacks. An incision was made between the T tacks and a 17 gauge trocar needle was utilized to access the stomach. Needle position was confirmed within the stomach with aspiration of air and injection of a small amount of contrast. A stiff Glidewire was advanced into the gastric lumen and under intermittent fluoroscopic guidance, the access needle was exchanged for a telescoping peel-away sheath, ultimately allowing placement of an 18 Fr balloon retention gastrostomy tube. The retention balloon was insufflated with a mixture of dilute saline and contrast and pulled taut against the anterior wall of the stomach. The external disc was cinched. Contrast injection confirms positioning within the stomach. Several spot radiographic images were obtained in various obliquities for documentation. The patient tolerated procedure well without immediate post procedural complication. FINDINGS: After successful fluoroscopic guided placement, the gastrostomy tube is appropriately positioned with internal retention balloon against the ventral aspect of the gastric lumen. IMPRESSION: Successful fluoroscopic-guided percutaneous insertion of an 18 Fr balloon-retention gastrostomy tube. The gastrostomy may be used immediately for medication administration and in 4 hrs for the initiation of feeds. RECOMMENDATIONS: The patient will return to Vascular Interventional Radiology (VIR) for routine feeding tube evaluation and exchange in 6 months. Roanna Banning, MD [facility]  [facility] Radiology Electronically Signed   By: Roanna Banning M.D.   On: 05/17/2023 16:50   CT ABDOMEN WO CONTRAST  Result Date: 05/13/2023 CLINICAL DATA:  Cerebral infarction, dysphagia, current nasal feeding tube and assessment for possible percutaneous gastrostomy  tube placement for chronic nutritional needs. EXAM: CT ABDOMEN WITHOUT CONTRAST TECHNIQUE: Multidetector CT imaging of the abdomen was performed following the standard protocol without IV contrast. RADIATION DOSE REDUCTION: This exam was performed according to the departmental dose-optimization program which includes automated exposure control, adjustment of the mA and/or kV according to patient size and/or use of iterative reconstruction technique. COMPARISON:  CT of the abdomen and pelvis on 07/16/2022 FINDINGS: Lower chest: No acute abnormality. Visible heavily calcified coronary arteries. Hepatobiliary: No focal liver abnormality is seen. No gallstones, gallbladder wall thickening, or biliary dilatation. Pancreas: Unremarkable. No pancreatic ductal dilatation or surrounding inflammatory changes. Spleen: No splenic injury or perisplenic hematoma. Adrenals/Urinary Tract: No adrenal masses. Stable bilateral simple Bosniak 1 renal cysts requiring no follow-up. No hydronephrosis or renal calculi. Stomach/Bowel: Feeding tube enters the stomach and terminates in the region of the antrum of the stomach. No hiatal hernia. The stomach is normally positioned and there is no interposition of colon or liver between the body of the stomach and the abdominal wall. No evidence of bowel obstruction or significant ileus. There is some barium in the colon, presumably related to recent modified barium swallow studies. No free intraperitoneal air. Vascular/Lymphatic: Atherosclerosis of the abdominal aorta without aneurysm. No lymphadenopathy identified. Other: No hernias, ascites or focal fluid collections.  No anasarca. Musculoskeletal: No acute or significant  osseous findings. IMPRESSION: 1. Feeding tube enters the stomach and terminates in the region of the antrum of the stomach. The stomach is normally positioned and there is no interposition of colon or liver between the body of the stomach and the abdominal wall. There is no anatomic contraindication to attempted percutaneous gastrostomy tube placement. 2. Coronary atherosclerosis. 3. Aortic atherosclerosis. Aortic Atherosclerosis (ICD10-I70.0). Electronically Signed   By: Irish Lack M.D.   On: 05/13/2023 13:43   DG Swallowing Func-Speech Pathology  Result Date: 05/13/2023 Table formatting from the original result was not included. Modified Barium Swallow Study Patient Details Name: Timothy [name] MRN: [id number] Date of Birth: 1956/06/04 Today's Date: 05/13/2023 HPI/PMH: HPI: Patient is 67 y.o. 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 Pt s/p revascularization of the of the superior branch of the right MCA with mechanical thrombectomy achieving TICI 2C revascularization. PMH: ETOH abuse, CHF, CKD, HTN. MBS completed 04/26/23 revealed: "oropharyngeal dysphagia includ(ing) timing, motor, and sensory components. He has reduced lingual control with disorganized posterior transit although minimal oral residue. There is anterior loss out of the R side of his mouth though. He has reduced hyolaryngeal movement and laryngeal vestibule closure, and when swallow is mistimed, he penetrates during the swallow across consistencies. Penetration also occurred occasionally from intermittent vallecular residue. Pt's volitional cough is weak and ineffective at clearing penetrates, which continue to fall over time until aspiration occurs. Aspiration for the most part is silent, although spontaneous coughing is also not effective." Repeated MBS 05/04/23 with subsequent upgrade to Dys1/NTL diet. Clinical Impression: Clinical Impression: Pt presents with oropharyngeal  dysphagia primarily characterized by reduced oral bolus hold resulting in posterior spillage into the pharynx and delay in swallow initiation lasting between 1-3 seconds at the level of the pyriform sinuses. Swallow delay/mistiming and pooling of bolus into the pyriforms prior to swallow initiation resulted in trace silent aspiration of 4/5 administered thin liquids boluses from teaspoon despite trials of bolus hold and chin tuck strategies. Residue in the pharynx improved from previous swallowing assessment. Penetration above the level of the vocal  Physician Discharge Summary  Patient ID: Timothy [name] MRN: [id number] DOB/AGE: August 27, 1955 67 y.o.  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  CVA. EXAM: PERCUTANEOUS GASTROSTOMY TUBE PLACEMENT COMPARISON:  CT abdomen, 05/13/2023. FL swallow evaluation, 05/13/2023. MEDICATIONS: Ancef 2 gm IV; Antibiotics were administered within 1 hour of the procedure. CONTRAST:  15 mL of Isovue 300 administered into the gastric lumen. ANESTHESIA/SEDATION: Moderate (conscious) sedation was employed during this procedure. A total of Versed 2 mg and Fentanyl 100 mcg was administered intravenously. Moderate Sedation Time: 13 minutes. The patient's level of consciousness and vital signs were monitored continuously by radiology nursing throughout the procedure under my direct supervision. FLUOROSCOPY TIME:  Fluoroscopic dose; 2 mGy COMPLICATIONS: None immediate. PROCEDURE: Informed written consent was obtained from the patient and/or patient's representative following explanation of the procedure, risks, benefits and alternatives. A time out was performed prior to the initiation of the procedure. Maximal barrier sterile technique utilized including caps, mask, sterile gowns, sterile gloves, large sterile drape, hand hygiene and Betadine prep. The left upper quadrant was sterilely prepped and draped. A oral gastric catheter was inserted into the stomach under fluoroscopy. The existing  nasogastric feeding tube was removed. The left costal margin and barium opacified transverse colon were identified and avoided. Air was injected into the stomach for insufflation and visualization under fluoroscopy. Under sterile conditions and local anesthesia, 2T tacks were utilized to pexy the anterior aspect of the stomach against the ventral abdominal wall. Contrast injection confirmed appropriate positioning of each of the T tacks. An incision was made between the T tacks and a 17 gauge trocar needle was utilized to access the stomach. Needle position was confirmed within the stomach with aspiration of air and injection of a small amount of contrast. A stiff Glidewire was advanced into the gastric lumen and under intermittent fluoroscopic guidance, the access needle was exchanged for a telescoping peel-away sheath, ultimately allowing placement of an 18 Fr balloon retention gastrostomy tube. The retention balloon was insufflated with a mixture of dilute saline and contrast and pulled taut against the anterior wall of the stomach. The external disc was cinched. Contrast injection confirms positioning within the stomach. Several spot radiographic images were obtained in various obliquities for documentation. The patient tolerated procedure well without immediate post procedural complication. FINDINGS: After successful fluoroscopic guided placement, the gastrostomy tube is appropriately positioned with internal retention balloon against the ventral aspect of the gastric lumen. IMPRESSION: Successful fluoroscopic-guided percutaneous insertion of an 18 Fr balloon-retention gastrostomy tube. The gastrostomy may be used immediately for medication administration and in 4 hrs for the initiation of feeds. RECOMMENDATIONS: The patient will return to Vascular Interventional Radiology (VIR) for routine feeding tube evaluation and exchange in 6 months. Roanna Banning, MD [facility]  [facility] Radiology Electronically Signed   By: Roanna Banning M.D.   On: 05/17/2023 16:50   CT ABDOMEN WO CONTRAST  Result Date: 05/13/2023 CLINICAL DATA:  Cerebral infarction, dysphagia, current nasal feeding tube and assessment for possible percutaneous gastrostomy

## 2023-05-05 NOTE — Progress Notes (Signed)
Occupational Therapy Weekly Progress Note  Patient Details  Name: Timothy [name] MRN: [id number] Date of Birth: 1956/05/01  Beginning of progress report period: April 29, 2023 End of progress report period: May 05, 2023  Today's Date: 05/05/2023 OT Individual Time: 1330-1415 OT Individual Time Calculation (min): 45 min    Patient has met 4 of 4 short term goals.  Pt is demonstrating significant improvement with ADL performance and transfers. Pt currently CGA for dressing, CGA toileting and toilet/shower transfers. Pt also demonstrating improvement with RUE awareness with Mod VC for hand positioning for coordination. Pt still presenting with apraxic movements with RUE and slight RUE inattention with limited FMC of RUE.   Patient continues to demonstrate the following deficits: muscle weakness, decreased cardiorespiratoy endurance, impaired timing and sequencing, unbalanced muscle activation, motor apraxia, decreased coordination, and decreased motor planning, decreased attention to right and decreased motor planning, and decreased standing balance, hemiplegia, and decreased balance strategies and therefore will continue to benefit from skilled OT intervention to enhance overall performance with BADL and Reduce care partner burden.  Patient progressing toward long term goals..  Continue plan of care.  OT Short Term Goals Week 1:  OT Short Term Goal 1 (Week 1): Pt will don LB clothing with contact guard with extra itme. OT Short Term Goal 1 - Progress (Week 1): Met OT Short Term Goal 2 (Week 1): Pt will perform grooming task in standing with supervision OT Short Term Goal 2 - Progress (Week 1): Met OT Short Term Goal 3 (Week 1): Pt will perform toileting with min A  3/3 tasks OT Short Term Goal 3 - Progress (Week 1): Met OT Short Term Goal 4 (Week 1): Pt will use right hand at diminsted level OT Short Term Goal 4 - Progress (Week 1): Met Week 2:  OT Short Term Goal 1 (Week 2):  STG=LTG d/t ELOS  Skilled Therapeutic Interventions/Progress Updates:      Therapy Documentation Precautions:  Precautions Precautions: Fall Precaution Comments: R inattention; SBP <180, cortrak Restrictions Weight Bearing Restrictions: No Session 1 General: "I'm good!" Pt supine in bed upon OT arrival, agreeable to OT session.  Pain: no pain reported  ADL: Transfers/functional mobility: SBA with no AD overall for transfers and ambulation around room and therapy unit, OT challenged pt by asking shirt colors of OT and pt while ambulating, increased time    Exercises: Pt completed the following exercise circuit in order to improve functional activity, strength and endurance to prepare for ADLs such as bathing. Pt completed the following exercises in seated position with np noted LOB/SOB and 3x10 repetitions on each exercise, VC for pt to keep count for orientation, intermittent rest breaks during exercises. Pt completed listed exercises in order to increase strength/ROM of RUE and dual tasking: -bicep curls with 2# dowel rod -triceps extensions with 2# dowel rod -forward punches with VC for increased speed -shoulder flexion with 2# dowel rod -overhead press with 2# dowel rod   Other Treatments: Pt seated EOM attempting dual-tasking activities. OT instructed pt to tap Rt leg with Lt hand and Lt leg with Rt hand while marching in order to improve midline activities, Rt side awareness and motor planning/coordination. Pt demonstrated difficulty with task requiring hand over hand assistance with task. Task downgraded to just tapping although pt still demonstrating difficulty with motor planning requiring assistance and VC.   Pt supine in bed with bed alarm activated, 2 bed rails up, call light within reach and 4Ps assessed.  Session 2 General: "Hi!" Pt supine in bed upon OT arrival, agreeable to OT session.  Pain: no pain reported  ADL: Toilet transfer: SBA ambulating with no AD  from bed>toilet, standing to urinate although did not go Toileting: SBA overall, able to manage pants over waist  Exercises: Pt completed the following exercise circuit in order to improve functional activity, strength and endurance to prepare for ADLs such as bathing. Pt completed the following exercises in standing position with no noted LOB/SOB and 3x10 repetitions on each exercise. VC required for increased grip on wash cloth d/t decreased concentration on grip while completing exercises d/t decreased dual tasking. Pt completed exercises below: -standing marches with UE support on bed rail -wall washed clockwise/counter clockwise with RUE  -up/down wall washes with RUE  Other Treatments: Pt completed activity of folding towels of various sizes for dual tasking to increase RUE coordination and functional use. Pt required occasional demonstration or hand over hand assistance for using RUE. Pt demonstrated increased difficulty with [facility] when folding smaller towels versus larger towels. VC for direction for increased coordination with RUE.   Pt supine in bed with bed alarm activated, 2 bed rails up, call light within reach and 4Ps assessed.  Therapy/Group: Individual Therapy  Velia Meyer, OTD, OTR/L 05/05/2023, 6:40 PM

## 2023-05-05 NOTE — Progress Notes (Addendum)
Physical Therapy Weekly Progress Note  Patient Details  Name: Timothy [name] MRN: [id number] Date of Birth: 1955/11/30  Beginning of progress report period: April 29, 2023 End of progress report period: May 05, 2023  Today's Date: 05/05/2023 PT Individual Time: 0900-0958 PT Individual Time Calculation (min): 58 min   Patient has met 3 of 4 short term goals.  Pt is progressing well toward long terms goals and largely requires CGA/supervision with bed mobility, transfers, and gait >300 ft without AD. Plan to continue to practice dynamic balance and stair training without hand rails.   Patient continues to demonstrate the following deficits muscle weakness, impaired timing and sequencing, decreased coordination, and decreased motor planning, and decreased standing balance and therefore will continue to benefit from skilled PT intervention to increase functional independence with mobility.  Patient progressing toward long term goals..  Continue plan of care.  PT Short Term Goals Week 1:  PT Short Term Goal 1 (Week 1): Pt will complete sit to stand with CGA consistently PT Short Term Goal 1 - Progress (Week 1): Met PT Short Term Goal 2 (Week 1): Pt wil l complete bed to chair with CGA consistently. PT Short Term Goal 2 - Progress (Week 1): Met PT Short Term Goal 3 (Week 1): Pt willl ambulate x150' with CGA PT Short Term Goal 3 - Progress (Week 1): Met PT Short Term Goal 4 (Week 1): Pt will complete x13 steps with BHRs and CGA. PT Short Term Goal 4 - Progress (Week 1): Progressing toward goal Week 2:  PT Short Term Goal 1 (Week 2): STG's=LTG's due to ELOS  Skilled Therapeutic Interventions/Progress Updates:      Therapy Documentation Precautions:  Precautions Precautions: Fall Precaution Comments: R inattention; SBP <180, cortrak Restrictions Weight Bearing Restrictions: No  Pt received semi-reclined in bed, agreeable to PT session and without reports of pain.   Pt  incontinent of bladder and CGA with ambulatory transfer to toielt, pt also incontinent of bowel. Pt min A for anterior and posterior peri-care and sponge bath.   Pt min A for upper body dressing and supervision with lower body dressing.   Pt transitioned to dynamic gait (S)  and attention training. Pt able to find and collect numbered dots from various heights and requires mod cues to incorporate right UE with fine motor and grasping of dots.   Pt performed 2 x 10 of counter push up's to address strength and coordination deficits. Pt ambulated to room and left semi-reclined in bed with all needs in reach and alarm on.   Therapy/Group: Individual Therapy  [name] [name] PT, DPT  05/05/2023, 7:45 AM

## 2023-05-05 NOTE — Progress Notes (Signed)
Speech Language Pathology Daily Session Note  Patient Details  Name: Timothy [name] MRN: [id number] Date of Birth: [birth year]  Today's Date: 05/05/2023 SLP Individual Time: 0800-0900 SLP Individual Time Calculation (min): 60 min  Short Term Goals: Week 1: SLP Short Term Goal 1 (Week 1): Patient will consume trials of ice chips/sips of thin liquids via tsp with minimal overt s/s of aspiration with Min verbal cues over 2 sessions to assess readiness for repeat MBS. SLP Short Term Goal 2 (Week 1): Patient will answer biographical questions with 75% accuracy and Mod verbal cues. SLP Short Term Goal 3 (Week 1): Patient will attend to right visual field during functional tasks with Min verbal cues. SLP Short Term Goal 4 (Week 1): Patient will demonstrate selective attention to tasks in a mildly distracting enviornment for 30 minutes with Min verbal cues for redirection. SLP Short Term Goal 5 (Week 1): Patient will verbalize basic wants/needs at the phrase level with Mod A multimodal cues. SLP Short Term Goal 6 (Week 1): Patient will utilize speech intelligibility strategies at the phrase level to achieve ~75% intelligibility with Mod verbal cues.  Skilled Therapeutic Interventions: SLP conducted skilled therapy session targeting dysphagia management and communication goals. SLP began with assisting/supervising patient with Dys3/NTL breakfast tray. Per nursing reports, patient exhibited right pocketing and significant difficulty with Dys3 textures during dinner previous day, thus SLP performed dynamic assessment of swallowing function with tray. SLP observed right pocketing with patient demonstrating reduced awareness. Patient required totalA to slow rate with SLP assuming control over bolus administration speed. When SLP pointed out oral residue, patient required maxA to clear despite copious attempts at lingual sweep. Patient appeared to be tolerating sips of NTL adequately, though required mod-maxA for  slow rate strategy. SLP assessed tolerance of Dys1 texture with patient exhibiting no overt difficulty nor s/sx of penetration/aspiration. Likewise to other textures, requires assist for slow rate. SLP will downgrade diet to Dys1/NTL. Continue with full supervision. Crush medications and administer in puree. Wife present and SLP provided education re: reasoning for diet downgrade and the expectation that assistance at mealtimes should be provided by staff only. During communication facilitation tasks, patient answered basic biographical questions with 75% accuracy given min verbal cues and verbalized basic wants/needs at the word level with supervisionA. Patient requested assistance with utilizing urinal. Wife/RN assisted, patient continent of bladder. During tasks, patient attended to the right visual field during functional tasks given maxA. Patient oriented to month but disoriented to date, recalled anticipated discharge date with modA. Patient was left in lowered bed with call bell in reach and bed alarm set. SLP will continue to target goals per plan of care.      Pain Pain Assessment Pain Scale: 0-10 Pain Score: 0-No pain  Therapy/Group: Individual Therapy  [name], M.A., CCC-SLP  [name] 05/05/2023, 12:16 PM

## 2023-05-05 NOTE — Progress Notes (Signed)
PROGRESS NOTE   Subjective/Complaints:  Pt reports eating well with D3 nectar thick diet- Working on drinking more- mainly drinking nectar thick juices- doesn't like water.   LBM yesterday  ROS:  Pt denies SOB, abd pain, CP, N/V/C/D, and vision changes  Except HPI  Objective:   DG Swallowing Func-Speech Pathology  Result Date: 05/04/2023 Table formatting from the original result was not included. Modified Barium Swallow Study Patient Details Name: Timothy [name] MRN: [id number] Date of Birth: [birth year] Today's Date: 05/04/2023 HPI/PMH: HPI: Patient is 67 y.o. 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 Pt s/p revascularization of the of the superior branch of the right MCA with mechanical thrombectomy achieving TICI 2C revascularization. PMH: ETOH abuse, CHF, CKD, HTN. MBS completed 04/26/23 revealed: "oropharyngeal dysphagia includ(ing) timing, motor, and sensory components. He has reduced lingual control with disorganized posterior transit although minimal oral residue. There is anterior loss out of the R side of his mouth though. He has reduced hyolaryngeal movement and laryngeal vestibule closure, and when swallow is mistimed, he penetrates during the swallow across consistencies. Penetration also occurred occasionally from intermittent vallecular residue. Pt's volitional cough is weak and ineffective at clearing penetrates, which continue to fall over time until aspiration occurs. Aspiration for the most part is silent, although spontaneous coughing is also not effective." Clinical Impression: Clinical Impression: Pt presents with oropharyngeal dysphagia primarily characterized by cognitively reduced oral bolus control resulting in posterior spillage prior to swallow initiation. Delay in swallow initiation lasts between 1-3 seconds with liquids boluses and 8-10 seconds with regular  solids. With cup sips of thin liquids, swallow initiation is at the level of the pyriform sinuses with bolus material entering the laryngeal vestibule and dipping below the vocal folds (PAS 6) x2, then spontaneously ejected from the subglottal space with completion of the swallow (no cough observed). Once ejected from subglottal space, residue remained sitting on top of vocal folds. Throughout trials of nectar thick liquids and then again with Dys3 solids, thin liquid residue from vestibule was eventually silently aspirated. With NTL, penetration about the level of the vocal folds observed, however this penetration was transient and cleared with completion of the swallow consistently. Notably, liquid residue observed diffusely throughout pharynx post-swallow, however suspect residue is primarily d/t placement of NG tube as residue was concentrated in point of constriction that are partially obstructed by tube presence. Recommend initiation of Dys3/NTL diet with pills administered whole in puree. SLP will initiate water protocol for hydration with rules posted in room for nursing staff. SLP will trial Provale cup with thin liquids at next session given patient's overall tolerance of small thin liquids boluses from teaspoon. Factors that may increase risk of adverse event in presence of aspiration [name] & [name] 2021): Factors that may increase risk of adverse event in presence of aspiration [name] & [name] 2021): Reduced cognitive function; Weak cough; Presence of tubes (ETT, trach, NG, etc.) Recommendations/Plan: Swallowing Evaluation Recommendations Swallowing Evaluation Recommendations Recommendations: PO diet PO Diet Recommendation: Dysphagia 3 (Mechanical soft); Mildly thick liquids (Level 2, nectar thick) Liquid Administration via: Cup; Straw Medication Administration: Whole meds with  puree Supervision: Full supervision/cueing for swallowing strategies; Patient able to self-feed Postural changes: Position  pt fully upright for meals Oral care recommendations: Oral care QID (4x/day) Caregiver Recommendations: Have oral suction available Treatment Plan Treatment Plan Treatment recommendations: Therapy as outlined in treatment plan below Follow-up recommendations: Acute inpatient rehab (3 hours/day) Functional status assessment: Patient has had a recent decline in their functional status and demonstrates the ability to make significant improvements in function in a reasonable and predictable amount of time. Treatment frequency: Min 2x/week Treatment duration: 2 weeks Interventions: Aspiration precaution training; Compensatory techniques; Patient/family education; Trials of upgraded texture/liquids; Diet toleration management by SLP Recommendations Recommendations for follow up therapy are one component of a multi-disciplinary discharge planning process, led by the attending physician.  Recommendations may be updated based on patient status, additional functional criteria and insurance authorization. Assessment: Orofacial Exam: Orofacial Exam Oral Cavity: Oral Hygiene: WFL Oral Cavity - Dentition: Poor condition; Missing dentition Orofacial Anatomy: WFL Anatomy: Anatomy: WFL Boluses Administered: Boluses Administered Boluses Administered: Thin liquids (Level 0); Mildly thick liquids (Level 2, nectar thick); Moderately thick liquids (Level 3, honey thick); Puree; Solid  Oral Impairment Domain: Oral Impairment Domain Lip Closure: No labial escape Tongue control during bolus hold: Posterior escape of less than half of bolus (suspect cognitive impairment vs. oral) Bolus preparation/mastication: Timely and efficient chewing and mashing Bolus transport/lingual motion: Repetitive/disorganized tongue motion Oral residue: Trace residue lining oral structures Location of oral residue : Tongue; Palate Initiation of pharyngeal swallow : Pyriform sinuses  Pharyngeal Impairment Domain: Pharyngeal Impairment Domain Soft palate  elevation: No bolus between soft palate (SP)/pharyngeal wall (PW) Laryngeal elevation: Partial superior movement of thyroid cartilage/partial approximation of arytenoids to epiglottic petiole Anterior hyoid excursion: Complete anterior movement Epiglottic movement: Complete inversion Laryngeal vestibule closure: Incomplete, narrow column air/contrast in laryngeal vestibule Pharyngeal stripping wave : Present - diminished Pharyngeal contraction (A/P view only): N/A Pharyngoesophageal segment opening: Partial distention/partial duration, partial obstruction of flow (compounded by NG tube) Tongue base retraction: Trace column of contrast or air between tongue base and PPW Pharyngeal residue: Collection of residue within or on pharyngeal structures Location of pharyngeal residue: Valleculae; Pyriform sinuses (UES)  Esophageal Impairment Domain: Esophageal Impairment Domain Esophageal clearance upright position: Esophageal retention Pill: Pill Consistency administered: Puree Puree: WFL Penetration/Aspiration Scale Score: Penetration/Aspiration Scale Score 1.  Material does not enter airway: Moderately thick liquids (Level 3, honey thick); Puree; Solid; Pill 2.  Material enters airway, remains ABOVE vocal cords then ejected out: Mildly thick liquids (Level 2, nectar thick) 8.  Material enters airway, passes BELOW cords without attempt by patient to eject out (silent aspiration) : Thin liquids (Level 0) Compensatory Strategies: Compensatory Strategies Compensatory strategies: No   General Information: Caregiver present: No  Diet Prior to this Study: NPO   Temperature : Normal   Respiratory Status: WFL   Supplemental O2: None (Room air)   History of Recent Intubation: Yes  Behavior/Cognition: Alert; Cooperative; Requires cueing; Pleasant mood Self-Feeding Abilities: Able to self-feed Baseline vocal quality/speech: Normal No data recorded Volitional Swallow: Able to elicit (weak) Exam Limitations: No limitations Goal  Planning: Prognosis for improved oropharyngeal function: Good Barriers to Reach Goals: Language deficits; Cognitive deficits No data recorded Patient/Family Stated Goal: none stated Consulted and agree with results and recommendations: Patient Pain: Pain Assessment Pain Assessment: No/denies pain End of Session: Start Time:No data recorded Stop Time: No data recorded Time Calculation:No data recorded Charges: No data recorded SLP visit diagnosis: SLP Visit Diagnosis: Dysphagia, oropharyngeal phase (R13.12)  Past Medical History: Past Medical History: Diagnosis Date  Alcohol abuse   Atrial fibrillation (HCC)   CHF (congestive heart failure) (HCC)   CKD (chronic kidney disease) stage 3, GFR 30-59 ml/min (HCC)   Hyperlipidemia   Hypertension   Noncompliance  Past Surgical History: Past Surgical History: Procedure Laterality Date  INGUINAL HERNIA REPAIR Right 07/17/2022  Procedure: HERNIA REPAIR INGUINAL INCARCERATED;  Surgeon: Lucretia Roers, MD;  Location: [facility];  Service: General;  Laterality: Right;  IR CT HEAD LTD  04/21/2023  IR PERCUTANEOUS ART THROMBECTOMY/INFUSION INTRACRANIAL INC DIAG ANGIO  04/21/2023  RADIOLOGY WITH ANESTHESIA N/A 04/21/2023  Procedure: IR WITH ANESTHESIA;  Surgeon: Radiologist, Medication, MD;  Location: [facility];  Service: Radiology;  Laterality: N/A;  XI ROBOTIC ASSISTED INGUINAL HERNIA REPAIR WITH MESH Left 03/19/2023  Procedure: XI ROBOTIC ASSISTED INGUINAL HERNIA REPAIR WITH MESH;  Surgeon: Franky Macho, MD;  Location: [facility];  Service: General;  Laterality: Left; [name], M.A., CCC-SLP Garey Ham Ellin 05/04/2023, 12:07 PM  Recent Labs    05/03/23 0647  WBC 3.8*  HGB 13.5  HCT 41.4  PLT 148*    Recent Labs    05/03/23 0647 05/04/23 0557  NA 136 136  K 4.8 4.9  CL 103 101  CO2 24 26  GLUCOSE 132* 166*  BUN 29* 34*  CREATININE 1.48* 1.54*  CALCIUM 9.3 9.7   No intake or output data in the 24 hours ending 05/05/23 1252       Physical Exam: Vital Signs Blood  pressure 110/66, pulse 60, temperature 98.2 F (36.8 C), temperature source Oral, resp. rate 17, height 5\' 7"  (1.702 m), weight 70.8 kg, SpO2 100%.     General: awake, alert, appropriate, sitting up slightly in bed; NAD HENT: conjugate gaze; oropharynx moist- cortrak in place CV: regular rate- rate 60's this AM; no JVD Pulmonary: CTA B/L; no W/R/R- good air movement GI: soft, NT, ND, (+)BS- normoactive Psychiatric: appropriate but quiet- aphasic Neurological: alert-   Oriented to person place but not time RUE: 4-/5 Deltoid, 4-/5 Biceps, 4-/5 Triceps, 4-/5 Grip LUE: 5/5 Deltoid, 5/5 Biceps, 5/5 Triceps,  5/5 Grip RLE: HF 4/5, KE 4+/5, ADF 4+/5, APF 4+/5 LLE: HF 5/5, KE 5/5, ADF 5/5, APF 5/5 Incision intact light touch in all 4 extremities Finger-nose and intact on the left, altered on the right in proportion to his weakness Musculoskeletal:  Normal bulk, no hypertonia noted No joint swelling noted Right upper extremity fifth digit contracture-wife reports this is chronic   Assessment/Plan: 1. Functional deficits which require 3+ hours per day of interdisciplinary therapy in a comprehensive inpatient rehab setting. Physiatrist is providing close team supervision and 24 hour management of active medical problems listed below. Physiatrist and rehab team continue to assess barriers to discharge/monitor patient progress toward functional and medical goals  Care Tool:  Bathing    Body parts bathed by patient: Right arm, Chest, Abdomen, Front perineal area, Buttocks, Right upper leg, Left upper leg, Face   Body parts bathed by helper: Left arm, Right lower leg, Left lower leg     Bathing assist Assist Level: Moderate Assistance - Patient 50 - 74%     Upper Body Dressing/Undressing Upper body dressing   What is the patient wearing?: Pull over shirt    Upper body assist Assist Level: Moderate Assistance - Patient 50 - 74%    Lower Body Dressing/Undressing Lower body  dressing      What is the patient wearing?: Incontinence brief, Pants  Lower body assist Assist for lower body dressing: Moderate Assistance - Patient 50 - 74%     Toileting Toileting    Toileting assist Assist for toileting: Maximal Assistance - Patient 25 - 49%     Transfers Chair/bed transfer  Transfers assist     Chair/bed transfer assist level: Contact Guard/Touching assist     Locomotion Ambulation   Ambulation assist      Assist level: Contact Guard/Touching assist Assistive device: No Device Max distance: 150'   Walk 10 feet activity   Assist     Assist level: Contact Guard/Touching assist Assistive device: No Device   Walk 50 feet activity   Assist    Assist level: Contact Guard/Touching assist Assistive device: No Device    Walk 150 feet activity   Assist    Assist level: Contact Guard/Touching assist Assistive device: No Device    Walk 10 feet on uneven surface  activity   Assist     Assist level: Minimal Assistance - Patient > 75%     Wheelchair     Assist Is the patient using a wheelchair?: Yes Type of Wheelchair: Manual    Wheelchair assist level: Dependent - Patient 0% Max wheelchair distance: 150'    Wheelchair 50 feet with 2 turns activity    Assist        Assist Level: Dependent - Patient 0%   Wheelchair 150 feet activity     Assist      Assist Level: Dependent - Patient 0%   Blood pressure 110/66, pulse 60, temperature 98.2 F (36.8 C), temperature source Oral, resp. rate 17, height 5\' 7"  (1.702 m), weight 70.8 kg, SpO2 100%.  Medical Problem List and Plan: 1. 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  Etiology suspected to be A-fib not on anticoagulation versus large vessel disease             -patient may shower             -ELOS/Goals: 10 to 14 days, supervision to min assist with PT, OT, SLP          D/c  date set for 05/13/23  Con't CIR PT, OT and SLP  Now on D3 nectar thick diet Limited by medical issues - elevated BUN/Cr 2.  Antithrombotics: -DVT/anticoagulation:  Pharmaceutical: Heparin initiated for anticoagulation. PLAN TO TRANSITION TO ELIQUIS 5 MG BID ONCE ABLE TO SWALLOW. 9/28 still NPO 10/1- per pharmacy change change to Eliquis- stopped ASA and Heparin and added Eliquis - but then realized wasn't clear- called pharmacy again- will reach out to Dr Pearlean Brownie and see if need ASA at all? 10/2- ASA stopped per Dr Pearlean Brownie             -antiplatelet therapy: Aspirin 325 mg daily 3. Pain Management: tylenol as needed  9/30- denies pain- con't regimen 4. Mood/Behavior/Sleep: Provide emotional support             -antipsychotic agents: N/A 5. Neuropsych/cognition: This patient is not capable of making decisions on his own behalf. 6. Skin/Wound Care: Routine skin checks 7. Fluids/Electrolytes/Nutrition: Routine and analysis with follow-up chemistries 8.  Dysphagia.  Currently NPO, Cortrak. Speech therapy follow-up.  9/27- pulled cortrak accidentally last night- out a few inches- per nursing- KUB looks OK- reviewed it- can use- mittens? Recheck modified barium swallow on Tuesday, October 1  10/1- pending MBSS  10/2- now on D3 nectar thick diet 9.  Hypertension.  Imdur  60 mg daily.  Monitor with increased mobility.  Long-term blood pressure goal normotensive  9/26- will change Imdur to immediate release- d/w pharmacy- changed  9/30- HR slightly bradycardic- will monitor Vitals:   05/05/23 0313 05/05/23 0814  BP: 120/67 110/66  Pulse: (!) 58 60  Resp: 18 17  Temp: 98.4 F (36.9 C) 98.2 F (36.8 C)  SpO2: 100% 100%    10.  Bradycardia into the 40s.  Coreg held.  Avoid negative chronotropic medications.   Monitor with increased mobility  9/30-10/1- doing slightly better- in 50s- not 40's  10/2- HR doing better- 50s-60's - con't regimen 11.  Hyperlipidemia.  Pravachol 12.  CKD stage III.   Follow-up chemistries  9/30- Cr 1.48- up from 1.30- BUN up to 29- will increase Free water to 250 cc q4 - don't want to increase further due to [facility]-- and see how goes in labs tomorrow-   10/1- BUN up to 34 and Cr 1.54 up from 1.48- will consult nutritionist  for free water changes-   10/2- spoke with nutritonist- will change TF's to night time only- maintain fluids, but decrease protein- also adding supplements if doesn't eat enough- per pt/wife eating really well- will check BMP in AM    Latest Ref Rng & Units 05/04/2023    5:57 AM 05/03/2023    6:47 AM 04/30/2023    5:40 AM  BMP  Glucose 70 - 99 mg/dL 161  096  045   BUN 8 - 23 mg/dL 34  29  24   Creatinine 0.61 - 1.24 mg/dL 4.09  8.11  9.14   Sodium 135 - 145 mmol/L 136  136  133   Potassium 3.5 - 5.1 mmol/L 4.9  4.8  4.2   Chloride 98 - 111 mmol/L 101  103  100   CO2 22 - 32 mmol/L 26  24  28    Calcium 8.9 - 10.3 mg/dL 9.7  9.3  8.6     13.  Diastolic congestive heart failure.  Monitor for any signs of fluid overload. 9/28- stable   9/30- weight stable- 10/1- no weight today- will d/w nursing   10/2- weight up slightly- will monitor for trend Filed Weights   05/02/23 0500 05/03/23 0451 05/05/23 0500  Weight: 69.7 kg 69.5 kg 70.8 kg    14.  History of tobacco/alcohol use/Cocaine.  Provide counseling 15.  Medical noncompliance.  Counseling 16.  Atrial fibrillation.  Hold Coreg due to bradycardia.  Continue ASA and heparin until able to swallow and can switch to Eliquis  10/1- changed to Eliquis per Pharmacy- can do- can be crushed- stopped ASA and heparin   17. Constipation  9/26- will give sorbitol after therapy today  9/27- no results with sorbitol- will give another Sorbitol but 60cc and then soap suds enema if no results  10/1- cannot tell when having BM- has no control per wife- last went yesterday 18. Hyperkalemia  9/26- will recheck in AM- is 5.3- however was 4.1- so hesitant to give Lokelma without rechecking 9/27 - K+  4.2- so likely hemolysis yesterday  19. Diabetes- on Tfs  9/26- will add SSI, since A1c 6.9 and on Tf's- do q4 hours per protocol.   9/30-10/1 CBGs running well- might need to change if gets diet with MBSS today  10/2- change to TID with meals- and monitor CBG (last 3)  Recent Labs    05/05/23 0351 05/05/23 0805 05/05/23 1151  GLUCAP 153* 137* 117*     I  spent a total of  42  minutes on total care today- >50% coordination of care- due to  Speaking with SLP and pharmacy as well as pt- push nectar thick liquids-also changing SSI and CBGs and reordering labs.   LOS: 7 days A FACE TO FACE EVALUATION WAS PERFORMED  Reyonna Haack 05/05/2023, 12:52 PM

## 2023-05-06 DIAGNOSIS — I63512 Cerebral infarction due to unspecified occlusion or stenosis of left middle cerebral artery: Secondary | ICD-10-CM | POA: Diagnosis not present

## 2023-05-06 LAB — GLUCOSE, CAPILLARY
Glucose-Capillary: 157 mg/dL — ABNORMAL HIGH (ref 70–99)
Glucose-Capillary: 134 mg/dL — ABNORMAL HIGH (ref 70–99)
Glucose-Capillary: 121 mg/dL — ABNORMAL HIGH (ref 70–99)
Glucose-Capillary: 114 mg/dL — ABNORMAL HIGH (ref 70–99)

## 2023-05-06 LAB — BASIC METABOLIC PANEL
Anion gap: 9 (ref 5–15)
BUN: 42 mg/dL — ABNORMAL HIGH (ref 8–23)
CO2: 27 mmol/L (ref 22–32)
Calcium: 9.4 mg/dL (ref 8.9–10.3)
Chloride: 99 mmol/L (ref 98–111)
Creatinine, Ser: 1.55 mg/dL — ABNORMAL HIGH (ref 0.61–1.24)
GFR, Estimated: 48 mL/min — ABNORMAL LOW (ref >60)
Glucose, Bld: 150 mg/dL — ABNORMAL HIGH (ref 70–99)
Potassium: 5 mmol/L (ref 3.5–5.1)
Sodium: 135 mmol/L (ref 135–145)

## 2023-05-06 MED ORDER — SODIUM CHLORIDE 0.9 % IV SOLN: INTRAVENOUS | Status: AC

## 2023-05-06 NOTE — Progress Notes (Signed)
Physical Therapy Session Note  Patient Details  Name: Timothy [name] MRN: [id number] Date of Birth: [birth year]  Today's Date: 05/06/2023 PT Individual Time: 0900-1000, 1410-1455  PT Individual Time Calculation (min): 60 min , 45 min   Short Term Goals: Week 2:  PT Short Term Goal 1 (Week 2): STG's=LTG's due to ELOS  Skilled Therapeutic Interventions/Progress Updates:      Therapy Documentation Precautions:  Precautions Precautions: Fall Precaution Comments: R inattention; SBP <180, cortrak Restrictions Weight Bearing Restrictions: No  Treatment Session 1:   Pt received semi-reclined in bed and observed to be incontinent of bowel. Pt (S) with bed mobility and ambulatory transfer without AD to toilet and continent of bladder.   Pt set-up for lower body bathing and dressing (upper/lower body). Pt supervision with gait to main gym.   Pt utilized 8 inch curb step with 2.5# right ankle weight for following activities to address coordination, dual tasking and strength deficits with supervision:   -forward step ups x 10   -lateral (right) step ups x 10   -forward step ups and OH press with 1 # ankle weight to right wrist   Pt requires supervision for dynamic and reactive balance with bounce passes's from static and ambulatory position.   Pt without reports of pain in session.    Treatment Session 2:   Pt received semi-reclined in bed sleeping and arouses to voice. Pt agreeable to PT session and without reports of pain.   Pt supervision with bed mobility and ambulation ~300 ft to orthopedic gym with decreased speed. Pt performed Nu Step 6 + 4 minutes (level 4) with emphasis on R hand grip on bike handles.   Pt requires supervision for dynamic standing balance. Pt able to incorporate and utilize right hand to grasp bean bags and aim for corn hole target. Pt able to perform ~10-15 squats to retrieve bean bags from corn hole board and requires increased time and cues for supination  to assist with gripping.   Pt ambulated to room and left semi-reclined in bed with all needs in reach and alarm on.   Therapy/Group: Individual Therapy  [name] [name] PT, DPT  05/06/2023, 7:50 AM

## 2023-05-06 NOTE — Progress Notes (Signed)
Speech Language Pathology Weekly Progress and Session Note  Patient Details  Name: Timothy [name] MRN: [id number] Date of Birth: [birth year]  Beginning of progress report period: April 29, 2023 End of progress report period: May 06, 2023  Today's Date: 05/06/2023 SLP Individual Time: 0800-0900 SLP Individual Time Calculation (min): 60 min  Short Term Goals: Week 1: SLP Short Term Goal 1 (Week 1): Patient will consume trials of ice chips/sips of thin liquids via tsp with minimal overt s/s of aspiration with Min verbal cues over 2 sessions to assess readiness for repeat MBS. SLP Short Term Goal 1 - Progress (Week 1): Met SLP Short Term Goal 2 (Week 1): Patient will answer biographical questions with 75% accuracy and Mod verbal cues. SLP Short Term Goal 2 - Progress (Week 1): Met SLP Short Term Goal 3 (Week 1): Patient will attend to right visual field during functional tasks with Min verbal cues. SLP Short Term Goal 4 (Week 1): Patient will demonstrate selective attention to tasks in a mildly distracting enviornment for 30 minutes with Min verbal cues for redirection. SLP Short Term Goal 4 - Progress (Week 1): Met SLP Short Term Goal 5 (Week 1): Patient will verbalize basic wants/needs at the phrase level with Mod A multimodal cues. SLP Short Term Goal 5 - Progress (Week 1): Not met SLP Short Term Goal 6 (Week 1): Patient will utilize speech intelligibility strategies at the phrase level to achieve ~75% intelligibility with Mod verbal cues. SLP Short Term Goal 6 - Progress (Week 1): Not met  New Short Term Goals: Week 2: SLP Short Term Goal 1 (Week 2): STGs=LTGs d/t ELOS  Weekly Progress Updates: Patient is making excellent gains towards therapy goals, meeting 4/6 short term goals set this reporting period. Patient currently tolerating Dys1/NTL diet with no clinical s/sx of penetration/aspiration and no right pocketing. Patient exhibits significant right pocketing with difficulty  clearing with upgraded texture trials. During communication and cognitive tasks, patient requiring minA overall to attend to the right side for 30+ minutes. Patient benefits most from gestural cues to assist. Patient currently verbalizing wants/needs at the overall word level and phrase level productions are overall limited. At most recent session, patient answers biographical yes/no questions with >75% accuracy given supervision-minA. Given patient's limited verbal output and limited auditory comprehension as well as severity of language and swallowing deficits, speech intelligibility strategies were not formally targeted this reporting period. Patient's speech at the word level is 90-100% intelligible, though articulation remains semi-variable and imprecise. Patient and family education ongoing. Patient will continue to benefit from skilled therapy services during remainder of CIR stay.    Intensity: Minumum of 1-2 x/day, 30 to 90 minutes Frequency: 3 to 5 out of 7 days Duration/Length of Stay: 10/10 Treatment/Interventions: Cognitive remediation/compensation;Cueing hierarchy;Environmental controls;Dysphagia/aspiration precaution training;Functional tasks;Internal/external aids;Patient/family education;Speech/Language facilitation;Therapeutic Activities  Daily Session  Skilled Therapeutic Interventions: SLP conducted skilled therapy session targeting dysphagia management and communication goals. SLP assisted patient and assessed tolerance with breakfast tray containing Dys1/NTL textures. Patient tolerated all textures present on tray with no overt s/sx of penetration/aspiration and no difficulty orally including no pocketing throughout boluses. Patient did exhibit limited intake, though suspect displeasure with texture is contributing. SLP targeted improvements in oral deficits facilitating chewing/oral preparation task with Dys3 textures. Patient required max verbal cues to utilize lingual and/or digital  sweep to clear residue from the right buccal cavity and to utilize liquid wash to assist with oral clearance. Recommend continuation of Dys1/NTL diet at this time, crushed  medications in puree. Throughout session, patient continues to exhibit significant word finding deficits resulting in difficulty answering open ended questions. Patient required maxA and field of 2 presented options to answer 100% of open ended questions. Confrontation naming of basic environmental objects is a relative strength. During communication based object identification and following directions task, patient selectively attended to objects on the right for 30 minutes given minA. Patient was left in lowered bed with call bell in reach and bed alarm set. SLP will continue to target goals per plan of care.       Pain Pain Assessment Pain Scale: 0-10 Pain Score: 0-No pain  Therapy/Group: Individual Therapy  [name], M.A., CCC-SLP  [name] 05/06/2023, 8:27 AM

## 2023-05-06 NOTE — Progress Notes (Signed)
PROGRESS NOTE   Subjective/Complaints:  Pt says drinking about 4 small nectar thick juices/day- explained needs to drink 8+/day.  Slept OK.  Still no control of bowels   ROS:  Limited by aphasia- wife not at bedside to help  Except HPI  Objective:   No results found. No results for input(s): "WBC", "HGB", "HCT", "PLT" in the last 72 hours.   Recent Labs    05/04/23 0557 05/06/23 0718  NA 136 135  K 4.9 5.0  CL 101 99  CO2 26 27  GLUCOSE 166* 150*  BUN 34* 42*  CREATININE 1.54* 1.55*  CALCIUM 9.7 9.4    Intake/Output Summary (Last 24 hours) at 05/06/2023 1032 Last data filed at 05/05/2023 2315 Gross per 24 hour  Intake 420 ml  Output 1175 ml  Net -755 ml         Physical Exam: Vital Signs Blood pressure (!) 141/84, pulse (!) 56, temperature 98.1 F (36.7 C), resp. rate 17, height 5\' 7"  (1.702 m), weight 71.3 kg, SpO2 100%.      General: awake, alert, appropriate, sitting up some in bed; quiet today;  NAD HENT: conjugate gaze; oropharynx moist- Cortrak in place- still running this AM CV: regular rhythm, mildly bradycardic rate; no JVD Pulmonary: CTA B/L; no W/R/R- good air movement GI: soft, NT, ND, (+)BS Psychiatric: appropriate- more flat today Neurological: aphasic- more notable today since wife not there to speak for pt some  Oriented to person place but not time RUE: 4-/5 Deltoid, 4-/5 Biceps, 4-/5 Triceps, 4-/5 Grip LUE: 5/5 Deltoid, 5/5 Biceps, 5/5 Triceps,  5/5 Grip RLE: HF 4/5, KE 4+/5, ADF 4+/5, APF 4+/5 LLE: HF 5/5, KE 5/5, ADF 5/5, APF 5/5 Incision intact light touch in all 4 extremities Finger-nose and intact on the left, altered on the right in proportion to his weakness Musculoskeletal:  Normal bulk, no hypertonia noted No joint swelling noted Right upper extremity fifth digit contracture-wife reports this is chronic   Assessment/Plan: 1. Functional deficits which require  3+ hours per day of interdisciplinary therapy in a comprehensive inpatient rehab setting. Physiatrist is providing close team supervision and 24 hour management of active medical problems listed below. Physiatrist and rehab team continue to assess barriers to discharge/monitor patient progress toward functional and medical goals  Care Tool:  Bathing    Body parts bathed by patient: Right arm, Chest, Abdomen, Front perineal area, Buttocks, Right upper leg, Left upper leg, Face   Body parts bathed by helper: Left arm, Right lower leg, Left lower leg     Bathing assist Assist Level: Moderate Assistance - Patient 50 - 74%     Upper Body Dressing/Undressing Upper body dressing   What is the patient wearing?: Pull over shirt    Upper body assist Assist Level: Moderate Assistance - Patient 50 - 74%    Lower Body Dressing/Undressing Lower body dressing      What is the patient wearing?: Incontinence brief, Pants     Lower body assist Assist for lower body dressing: Moderate Assistance - Patient 50 - 74%     Toileting Toileting    Toileting assist Assist for toileting: Maximal Assistance - Patient 25 -  49%     Transfers Chair/bed transfer  Transfers assist     Chair/bed transfer assist level: Contact Guard/Touching assist     Locomotion Ambulation   Ambulation assist      Assist level: Contact Guard/Touching assist Assistive device: No Device Max distance: 150'   Walk 10 feet activity   Assist     Assist level: Contact Guard/Touching assist Assistive device: No Device   Walk 50 feet activity   Assist    Assist level: Contact Guard/Touching assist Assistive device: No Device    Walk 150 feet activity   Assist    Assist level: Contact Guard/Touching assist Assistive device: No Device    Walk 10 feet on uneven surface  activity   Assist     Assist level: Minimal Assistance - Patient > 75%     Wheelchair     Assist Is the patient  using a wheelchair?: Yes Type of Wheelchair: Manual    Wheelchair assist level: Dependent - Patient 0% Max wheelchair distance: 150'    Wheelchair 50 feet with 2 turns activity    Assist        Assist Level: Dependent - Patient 0%   Wheelchair 150 feet activity     Assist      Assist Level: Dependent - Patient 0%   Blood pressure (!) 141/84, pulse (!) 56, temperature 98.1 F (36.7 C), resp. rate 17, height 5\' 7"  (1.702 m), weight 71.3 kg, SpO2 100%.  Medical Problem List and Plan: 1. 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  Etiology suspected to be A-fib not on anticoagulation versus large vessel disease             -patient may shower             -ELOS/Goals: 10 to 14 days, supervision to min assist with PT, OT, SLP          D/c date set for 05/13/23  Con't CIR PT, OT and SLP  Diet now D1-  2.  Antithrombotics: -DVT/anticoagulation:  Pharmaceutical: Heparin initiated for anticoagulation. PLAN TO TRANSITION TO ELIQUIS 5 MG BID ONCE ABLE TO SWALLOW. 9/28 still NPO 10/1- per pharmacy change change to Eliquis- stopped ASA and Heparin and added Eliquis - but then realized wasn't clear- called pharmacy again- will reach out to Dr Pearlean Brownie and see if need ASA at all? 10/2- ASA stopped per Dr Pearlean Brownie             -antiplatelet therapy: Aspirin 325 mg daily 3. Pain Management: tylenol as needed  9/30- denies pain- con't regimen 4. Mood/Behavior/Sleep: Provide emotional support             -antipsychotic agents: N/A 5. Neuropsych/cognition: This patient is not capable of making decisions on his own behalf. 6. Skin/Wound Care: Routine skin checks 7. Fluids/Electrolytes/Nutrition: Routine and analysis with follow-up chemistries 8.  Dysphagia.  Currently NPO, Cortrak. Speech therapy follow-up.  9/27- pulled cortrak accidentally last night- out a few inches- per nursing- KUB looks OK- reviewed it- can use-  mittens? Recheck modified barium swallow on Tuesday, October 1  10/1- pending MBSS  10/2- now on D3 nectar thick diet  10/3- Diet downgraded to D1 with nectar thick due to work with therapy yesterday 9.  Hypertension.  Imdur 60 mg daily.  Monitor with increased mobility.  Long-term blood pressure goal normotensive  9/26- will change Imdur to immediate release- d/w pharmacy- changed  9/30- HR slightly  bradycardic- will monitor Vitals:   05/05/23 2050 05/06/23 0657  BP: 126/66 (!) 141/84  Pulse: 67 (!) 56  Resp: 17 17  Temp: 98.6 F (37 C) 98.1 F (36.7 C)  SpO2: 100% 100%    10.  Bradycardia into the 40s.  Coreg held.  Avoid negative chronotropic medications.   Monitor with increased mobility  9/30-10/1- doing slightly better- in 50s- not 40's  10/2-10/3 HR doing better- 50s-60's - con't regimen 11.  Hyperlipidemia.  Pravachol 12.  CKD stage IIIa.  Follow-up chemistries  9/30- Cr 1.48- up from 1.30- BUN up to 29- will increase Free water to 250 cc q4 - don't want to increase further due to [facility]-- and see how goes in labs tomorrow-   10/1- BUN up to 34 and Cr 1.54 up from 1.48- will consult nutritionist  for free water changes-   10/2- spoke with nutritonist- will change TF's to night time only- maintain fluids, but decrease protein- also adding supplements if doesn't eat enough- per pt/wife eating really well- will check BMP in AM  10/3- Pt's food downgraded to D1 diet- also BUN up to 42 up from 34 and Cr stable at 1.55- will give low dose IVFs to try and help turn this Around- will do 75cc for 12 hours 5pm to 5am- and recheck in AM with BMP    Latest Ref Rng & Units 05/06/2023    7:18 AM 05/04/2023    5:57 AM 05/03/2023    6:47 AM  BMP  Glucose 70 - 99 mg/dL 161  096  045   BUN 8 - 23 mg/dL 42  34  29   Creatinine 0.61 - 1.24 mg/dL 4.09  8.11  9.14   Sodium 135 - 145 mmol/L 135  136  136   Potassium 3.5 - 5.1 mmol/L 5.0  4.9  4.8   Chloride 98 - 111 mmol/L 99  101  103   CO2 22 -  32 mmol/L 27  26  24    Calcium 8.9 - 10.3 mg/dL 9.4  9.7  9.3     13.  Diastolic congestive heart failure.  Monitor for any signs of fluid overload. 9/28- stable   9/30- weight stable- 10/1- no weight today- will d/w nursing   10/2- weight up slightly- will monitor for trend  10/3- 71.3 kg today- up 1 lb from yesterday-if goes above 73 kg, will give Lasix Filed Weights   05/03/23 0451 05/05/23 0500 05/06/23 0657  Weight: 69.5 kg 70.8 kg 71.3 kg    14.  History of tobacco/alcohol use/Cocaine.  Provide counseling 15.  Medical noncompliance.  Counseling 16.  Atrial fibrillation.  Hold Coreg due to bradycardia.  Continue ASA and heparin until able to swallow and can switch to Eliquis  10/1- changed to Eliquis per Pharmacy- can do- can be crushed- stopped ASA and heparin   17. Constipation  9/26- will give sorbitol after therapy today  9/27- no results with sorbitol- will give another Sorbitol but 60cc and then soap suds enema if no results  10/1- cannot tell when having BM- has no control per wife- last went yesterday 18. Hyperkalemia  9/26- will recheck in AM- is 5.3- however was 4.1- so hesitant to give Lokelma without rechecking 9/27 - K+ 4.2- so likely hemolysis yesterday  19. Diabetes- on Tfs  9/26- will add SSI, since A1c 6.9 and on Tf's- do q4 hours per protocol.   9/30-10/1 CBGs running well- might need to change if gets diet with  MBSS today  10/2- change to TID with meals- and monitor  10/3- CBGs Adequate control- con't regimen- ok even with TF's overnight CBG (last 3)  Recent Labs    05/05/23 2038 05/06/23 0655 05/06/23 0808  GLUCAP 158* 157* 134*      I spent a total of 43   minutes on total care today- >50% coordination of care- due to  D/w PA- diet changed to D1- downgraded- will not remove Cortrak for now- Also medical issues- starting IVFs, keep an eye on weight- might actually end up needing Lasix due to weight- balancing kidneys and heart   LOS: 8 days A  FACE TO FACE EVALUATION WAS PERFORMED  Inella Kuwahara 05/06/2023, 10:32 AM

## 2023-05-06 NOTE — Progress Notes (Signed)
Occupational Therapy Session Note  Patient Details  Name: Timothy [name] MRN: [id number] Date of Birth: May 22, 1956  Today's Date: 05/06/2023 OT Individual Time: 1120-1200 OT Individual Time Calculation (min): 40 min    Short Term Goals: Week 2:  OT Short Term Goal 1 (Week 2): STG=LTG d/t ELOS  Skilled Therapeutic Interventions/Progress Updates:      Therapy Documentation Precautions:  Precautions Precautions: Fall Precaution Comments: R inattention; SBP <180, cortrak Restrictions Weight Bearing Restrictions: No General: "Hi hi" Pt supine in bed upon OT arrival, agreeable to OT session.  Pain: no pain reported  ADL: Toilet transfer: SBA ambulating from bed>toilet with no AD or LOB Toileting: SBA overall, able to complete pants management and hygiene after urinating Transfers/functional mobility: Pt ambulated community distances with no AD and close supervision with VC for Rt side awareness  Exercises: Pt completed the following activities in order to improve RUE coordination, FMC, grip/princh and awareness in order to complete ADLs such as bathing. Pt seated EOM for activities with no LOB, reaching at various planes and to floor to retrieve items: -Pt placing/removing squigz on vertical mirror with RUE, VC for correct grip positioning fpr increased independence -Pt catching/throwing blue sponge, instructed to catch with both hands to increase dual tasking ability, pt presenting initially with increased difficulty with times purposeful release to throw sponge, as activity progressed, pt demonstrated increased ability to purposefully release   Pt supine in bed with bed alarm activated, 2 bed rails up, call light within reach and 4Ps assessed.    Therapy/Group: Individual Therapy  Velia Meyer, OTD, OTR/L 05/06/2023, 12:36 PM

## 2023-05-07 LAB — BASIC METABOLIC PANEL
Anion gap: 8 (ref 5–15)
BUN: 33 mg/dL — ABNORMAL HIGH (ref 8–23)
CO2: 26 mmol/L (ref 22–32)
Calcium: 9.2 mg/dL (ref 8.9–10.3)
Chloride: 100 mmol/L (ref 98–111)
Creatinine, Ser: 1.31 mg/dL — ABNORMAL HIGH (ref 0.61–1.24)
GFR, Estimated: 59 mL/min — ABNORMAL LOW (ref >60)
Glucose, Bld: 161 mg/dL — ABNORMAL HIGH (ref 70–99)
Potassium: 4.5 mmol/L (ref 3.5–5.1)
Sodium: 134 mmol/L — ABNORMAL LOW (ref 135–145)

## 2023-05-07 LAB — GLUCOSE, CAPILLARY
Glucose-Capillary: 145 mg/dL — ABNORMAL HIGH (ref 70–99)
Glucose-Capillary: 147 mg/dL — ABNORMAL HIGH (ref 70–99)
Glucose-Capillary: 124 mg/dL — ABNORMAL HIGH (ref 70–99)
Glucose-Capillary: 114 mg/dL — ABNORMAL HIGH (ref 70–99)
Glucose-Capillary: 159 mg/dL — ABNORMAL HIGH (ref 70–99)

## 2023-05-07 NOTE — Progress Notes (Signed)
Physical Therapy Session Note  Patient Details  Name: Timothy [name] MRN: [id number] Date of Birth: [birth year]  Today's Date: 05/07/2023 PT Individual Time: 8295-6213 PT Individual Time Calculation (min): 53 min   Short Term Goals: Week 2:  PT Short Term Goal 1 (Week 2): STG's=LTG's due to ELOS  Skilled Therapeutic Interventions/Progress Updates:     Pt received semi reclined in bed and agrees to therapy. No complaint of pain but does verbalize fatigue. Pt performs supine to sit with bed features. Pt ambulates from room to dayroom, x125', with close supervision and cues for upright gaze to improve posture and balance. Following rest break, pt performs targeted foot tapping on colored cones to challenge dynamic standing balance, coordination, attention to task, and short term memory. Pt consistently and accurately performs 1 step commands with minA for balance and cues for posture. Pt has much more difficulty when cued for 2-step sequence unable to accurately complete despite multiple attempts. Following rest break, pt has improved performance and accuracy when completing 2-step sequences, but continues to require cueing ~50% of time for accurate recall. PT also provides minA to prevent LO B to the Rt.   Pt attempts Wii bowling activity for NMR but is unable to accurately coordinate fine motor skills to manipulate Wii controller, so activity terminated. Pt instead ambulates x500' with close supervision and consistent cueing to increase push off with RLE and manual cueing at RUE to decrease tone and promote increased arm swing and trunk rotation.   Pt completes Nustep for Rt hemibody NMR and endurance training, with pt completing x10:00 at workload of 5 with average steps per minute ~48. PT provides cues for hand and foot placement and completing full available ROM, as well as attending to RUE to facilitate consistent grip.   Following, pt ambulates back to room, x150', with same assistance and  cues. Pt left supine with alarm intact and all needs within reach.   Therapy Documentation Precautions:  Precautions Precautions: Fall Precaution Comments: R inattention; SBP <180, cortrak Restrictions Weight Bearing Restrictions: No   Therapy/Group: Individual Therapy  Beau Fanny, PT, DPT 05/07/2023, 3:45 PM

## 2023-05-07 NOTE — Progress Notes (Signed)
Speech Language Pathology Daily Session Note  Patient Details  Name: Timothy [name] MRN: [id number] Date of Birth: January 12, 1956  Today's Date: 05/07/2023 SLP Individual Time: 0801-0900 SLP Individual Time Calculation (min): 59 min  Short Term Goals: Week 2: SLP Short Term Goal 1 (Week 2): STGs=LTGs d/t ELOS  Skilled Therapeutic Interventions: SLP conducted skilled therapy session targeting dysphagia management goals. SLP assessed ongoing tolerance of current Dys1/NTL diet via breakfast tray with patient exhibiting no overt s/sx of penetration/aspiration throughout trials when cued to utilize slow rate. Several times throughout meal, patient would increase rate resulting in intermittent throat clear. Patient benefited from mod verbal cues to slow down. SLP trialed thin liquids via Provale cup to limit sip size with no overt s/sx of penetration/aspiration. During most recent swallow study, aspiration only noted with moderate cup sips of thin liquids, thus suspect patient tolerating thin liquids via Provale well. No change in vocal quality observed. Despite tolerance, patient observed to "chug" with Provale (putting cup up and down rapidly to get larger bolus), thus diet not formally upgraded at this time. During chewing trials with goldfish, patient improving with clearing right buccal cavity, though notably would only work to clear space when therapist leaned in to check pocket rather than clearing after every bite. Recommend continuation of Dys1/NTL diet at this time, medications crushed in puree. Recommend use of Provale cup for WATER with staff present between meals. Patient was left in lowered bed with call bell in reach and bed alarm set. SLP will continue to target goals per plan of care.      Pain Pain Assessment Pain Scale: 0-10 Pain Score: 0-No pain  Therapy/Group: Individual Therapy  [name], M.A., CCC-SLP  [name] 05/07/2023, 8:42 AM

## 2023-05-07 NOTE — Progress Notes (Signed)
Occupational Therapy Session Note  Patient Details  Name: TYRIQ MORAGNE MRN: [id number] Date of Birth: 05/29/1956  Today's Date: 05/07/2023 OT Individual Time: 5366-4403 OT Individual Time Calculation (min): 57 min    Short Term Goals: Week 2:  OT Short Term Goal 1 (Week 2): STG=LTG d/t ELOS  Skilled Therapeutic Interventions/Progress Updates: Patient received resting in bed, alert and willing to participate with OT treatment. Patient seen for ADL training. Assisted patient OOB with SBA for safety. Patient walked to the sink for grooming hygiene and bathing tasks. Performed at the w/c level. Patient needing assist for safety when performing BLE bathing and dressing. Set up assist for all other seated grooming and bathing tasks. See below for full list of ADL functional levels. Followed self care with walk to the therapy gym for FM neuro re education activities working around digit 5 contracture limiting in hand storage and manipulation tasks. Patient able to use tip pinch, and stereognosis to manage flattened marbles for picking each item up one at a time with the ability to hold 5 total in his hand. Followed with reaching task working on RUE targeting at shoulder height. Concluded treatment with gross grasp followed with full supination to stack cones up side down. Needed assist to stabilize cone when placed up side down as patient let go of the cone for 6 reps. Good participation and ability to follow cues. Continue with skilled OT POC to achieve all LTG's     Therapy Documentation Precautions:  Precautions Precautions: Fall Precaution Comments: R inattention; SBP <180, cortrak Restrictions Weight Bearing Restrictions: No    Pain: Pain Assessment Pain Scale: 0-10 Pain Score: 0-No pain ADL: ADL Grooming:  Min assist for thoroughness with oral care Upper Body Bathing: Set up assistance Where Assessed-Upper Body Bathing: Sitting at sink Lower Body Bathing: Moderate  assistance Where Assessed-Lower Body Bathing: Sitting at sink Upper Body Dressing: Min assistance Where Assessed-Upper Body Dressing: Sitting at sink Lower Body Dressing: Moderate assistance Where Assessed-Lower Body Dressing: Sitting at sink Toileting: Maximal assistance Where Assessed-Toileting: Actuary Transfer: Minimal assistance      Therapy/Group: Individual Therapy  Warnell Forester 05/07/2023, 12:11 PM

## 2023-05-07 NOTE — Progress Notes (Signed)
Occupational Therapy Session Note  Patient Details  Name: KEROLOS NEHME MRN: [id number] Date of Birth: [birth year]  Today's Date: 05/07/2023 OT Individual Time: 1130-1200 OT Individual Time Calculation (min): 30 min    Short Term Goals: Week 2:  OT Short Term Goal 1 (Week 2): STG=LTG d/t ELOS  Skilled Therapeutic Interventions/Progress Updates:    Patient received supine in bed, agreeable to OT.  Patient ambulated to gym facilitating upright posture, weight shift left/right and step length.  Patient needing intermittent cueing to maintain upright gaze.   Neuromuscular reeducation to address RUE. Overhead reach patterns, then interlimb coordination to stack blocks 4 tall.  Patient with improved awareness of position of forearm and hand with activity restrictions.  Patient returned to room and to bed.  Alarm engaged and call bell/ personal items in reach.   Therapy Documentation Precautions:  Precautions Precautions: Fall Precaution Comments: R inattention; SBP <180, cortrak Restrictions Weight Bearing Restrictions: No   Pain: Pain Assessment Pain Scale: 0-10 Pain Score: 0-No pain      Therapy/Group: Individual Therapy  Collier Salina 05/07/2023, 12:04 PM

## 2023-05-07 NOTE — Progress Notes (Signed)
PROGRESS NOTE   Subjective/Complaints:  Pt reports drank ~ 6 nectar thick juices yesterday-  Labs pending Pt reports still no control of bowels- educated pt about trying bowel program with suppository- he refused, but said would "think about it".  Denies any pain, including no HA.     ROS:  Limited by aphasia- denies SOB, pain or constipation  Except HPI  Objective:   No results found. No results for input(s): "WBC", "HGB", "HCT", "PLT" in the last 72 hours.   Recent Labs    05/06/23 0718  NA 135  K 5.0  CL 99  CO2 27  GLUCOSE 150*  BUN 42*  CREATININE 1.55*  CALCIUM 9.4    Intake/Output Summary (Last 24 hours) at 05/07/2023 0820 Last data filed at 05/06/2023 2205 Gross per 24 hour  Intake 360 ml  Output 575 ml  Net -215 ml         Physical Exam: Vital Signs Blood pressure (!) 133/58, pulse (!) 55, temperature 98 F (36.7 C), resp. rate 17, height 5\' 7"  (1.702 m), weight 71.8 kg, SpO2 100%.       General: awake, alert, appropriate, almost supine in bed- getting Tfs,NAD HENT: conjugate gaze; oropharynx moist- cortrak in place CV: regular rhythm, mildly bradycardic  rate; no JVD Pulmonary: CTA B/L; no W/R/R- good air movement GI: soft, NT, ND, (+)BS Psychiatric: appropriate-still flat affect Neurological: aphasic, but speaking slightly more fluently Extremities- no LE edema  Oriented to person place but not time RUE: 4-/5 Deltoid, 4-/5 Biceps, 4-/5 Triceps, 4-/5 Grip LUE: 5/5 Deltoid, 5/5 Biceps, 5/5 Triceps,  5/5 Grip RLE: HF 4/5, KE 4+/5, ADF 4+/5, APF 4+/5 LLE: HF 5/5, KE 5/5, ADF 5/5, APF 5/5 Incision intact light touch in all 4 extremities Finger-nose and intact on the left, altered on the right in proportion to his weakness Musculoskeletal:  Normal bulk, no hypertonia noted No joint swelling noted Right upper extremity fifth digit contracture-wife reports this is chronic    Assessment/Plan: 1. Functional deficits which require 3+ hours per day of interdisciplinary therapy in a comprehensive inpatient rehab setting. Physiatrist is providing close team supervision and 24 hour management of active medical problems listed below. Physiatrist and rehab team continue to assess barriers to discharge/monitor patient progress toward functional and medical goals  Care Tool:  Bathing    Body parts bathed by patient: Right arm, Chest, Abdomen, Front perineal area, Buttocks, Right upper leg, Left upper leg, Face   Body parts bathed by helper: Left arm, Right lower leg, Left lower leg     Bathing assist Assist Level: Moderate Assistance - Patient 50 - 74%     Upper Body Dressing/Undressing Upper body dressing   What is the patient wearing?: Pull over shirt    Upper body assist Assist Level: Moderate Assistance - Patient 50 - 74%    Lower Body Dressing/Undressing Lower body dressing      What is the patient wearing?: Incontinence brief, Pants     Lower body assist Assist for lower body dressing: Moderate Assistance - Patient 50 - 74%     Toileting Toileting    Toileting assist Assist for toileting: Maximal Assistance - Patient  25 - 49%     Transfers Chair/bed transfer  Transfers assist     Chair/bed transfer assist level: Contact Guard/Touching assist     Locomotion Ambulation   Ambulation assist      Assist level: Contact Guard/Touching assist Assistive device: No Device Max distance: 150'   Walk 10 feet activity   Assist     Assist level: Contact Guard/Touching assist Assistive device: No Device   Walk 50 feet activity   Assist    Assist level: Contact Guard/Touching assist Assistive device: No Device    Walk 150 feet activity   Assist    Assist level: Contact Guard/Touching assist Assistive device: No Device    Walk 10 feet on uneven surface  activity   Assist     Assist level: Minimal Assistance -  Patient > 75%     Wheelchair     Assist Is the patient using a wheelchair?: Yes Type of Wheelchair: Manual    Wheelchair assist level: Dependent - Patient 0% Max wheelchair distance: 150'    Wheelchair 50 feet with 2 turns activity    Assist        Assist Level: Dependent - Patient 0%   Wheelchair 150 feet activity     Assist      Assist Level: Dependent - Patient 0%   Blood pressure (!) 133/58, pulse (!) 55, temperature 98 F (36.7 C), resp. rate 17, height 5\' 7"  (1.702 m), weight 71.8 kg, SpO2 100%.  Medical Problem List and Plan: 1. 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  Etiology suspected to be A-fib not on anticoagulation versus large vessel disease             -patient may shower             -ELOS/Goals: 10 to 14 days, supervision to min assist with PT, OT, SLP          D/c date set for 05/13/23  Con't CIR PT and OT and SLP  Diet now D1- nectar thick  Labs pending  Labs pending for today- haven't been drawn yet.  2.  Antithrombotics: -DVT/anticoagulation:  Pharmaceutical: Heparin initiated for anticoagulation. PLAN TO TRANSITION TO ELIQUIS 5 MG BID ONCE ABLE TO SWALLOW. 9/28 still NPO 10/1- per pharmacy change change to Eliquis- stopped ASA and Heparin and added Eliquis - but then realized wasn't clear- called pharmacy again- will reach out to Dr Pearlean Brownie and see if need ASA at all? 10/2- ASA stopped per Dr Pearlean Brownie             -antiplatelet therapy: Aspirin 325 mg daily 3. Pain Management: tylenol as needed  9/30- denies pain- con't regimen 4. Mood/Behavior/Sleep: Provide emotional support             -antipsychotic agents: N/A 5. Neuropsych/cognition: This patient is not capable of making decisions on his own behalf. 6. Skin/Wound Care: Routine skin checks 7. Fluids/Electrolytes/Nutrition: Routine and analysis with follow-up chemistries 8.  Dysphagia.  Currently NPO, Cortrak.  Speech therapy follow-up.  9/27- pulled cortrak accidentally last night- out a few inches- per nursing- KUB looks OK- reviewed it- can use- mittens? Recheck modified barium swallow on Tuesday, October 1  10/1- pending MBSS  10/2- now on D3 nectar thick diet  10/3- Diet downgraded to D1 with nectar thick due to work with therapy yesterday 9.  Hypertension.  Imdur 60 mg daily.  Monitor with increased mobility.  Long-term blood pressure  goal normotensive  9/26- will change Imdur to immediate release- d/w pharmacy- changed  9/30- HR slightly bradycardic- will monitor Vitals:   05/06/23 1935 05/07/23 0501  BP: (!) 152/88 (!) 133/58  Pulse: (!) 58 (!) 55  Resp: 18 17  Temp: 98.4 F (36.9 C) 98 F (36.7 C)  SpO2: 98% 100%    10.  Bradycardia into the 40s.  Coreg held.  Avoid negative chronotropic medications.   Monitor with increased mobility  9/30-10/1- doing slightly better- in 50s- not 40's  10/2-10/3 HR doing better- 50s-60's - con't regimen 11.  Hyperlipidemia.  Pravachol 12.  CKD stage IIIa.  Follow-up chemistries  9/30- Cr 1.48- up from 1.30- BUN up to 29- will increase Free water to 250 cc q4 - don't want to increase further due to [facility]-- and see how goes in labs tomorrow-   10/1- BUN up to 34 and Cr 1.54 up from 1.48- will consult nutritionist  for free water changes-   10/2- spoke with nutritonist- will change TF's to night time only- maintain fluids, but decrease protein- also adding supplements if doesn't eat enough- per pt/wife eating really well- will check BMP in AM  10/3- Pt's food downgraded to D1 diet- also BUN up to 42 up from 34 and Cr stable at 1.55- will give low dose IVFs to try and help turn this Around- will do 75cc for 12 hours 5pm to 5am- and recheck in AM with BMP  10/4- pending labs this AM- pt doesn't have labs drawn yet- pending-     Latest Ref Rng & Units 05/06/2023    7:18 AM 05/04/2023    5:57 AM 05/03/2023    6:47 AM  BMP  Glucose 70 - 99 mg/dL 696  295   284   BUN 8 - 23 mg/dL 42  34  29   Creatinine 0.61 - 1.24 mg/dL 1.32  4.40  1.02   Sodium 135 - 145 mmol/L 135  136  136   Potassium 3.5 - 5.1 mmol/L 5.0  4.9  4.8   Chloride 98 - 111 mmol/L 99  101  103   CO2 22 - 32 mmol/L 27  26  24    Calcium 8.9 - 10.3 mg/dL 9.4  9.7  9.3     13.  Diastolic congestive heart failure.  Monitor for any signs of fluid overload. 9/28- stable   9/30- weight stable- 10/1- no weight today- will d/w nursing   10/2- weight up slightly- will monitor for trend  10/3- 71.3 kg today- up 1 lb from yesterday-if goes above 73 kg, will give Lasix  10/4- Weight 71.8 kg this AM- will monitor Filed Weights   05/05/23 0500 05/06/23 0657 05/07/23 0501  Weight: 70.8 kg 71.3 kg 71.8 kg    14.  History of tobacco/alcohol use/Cocaine.  Provide counseling 15.  Medical noncompliance.  Counseling 16.  Atrial fibrillation.  Hold Coreg due to bradycardia.  Continue ASA and heparin until able to swallow and can switch to Eliquis  10/1- changed to Eliquis per Pharmacy- can do- can be crushed- stopped ASA and heparin   17. Constipation  9/26- will give sorbitol after therapy today  9/27- no results with sorbitol- will give another Sorbitol but 60cc and then soap suds enema if no results  10/1- cannot tell when having BM- has no control per wife- last went yesterday  10/4- LBM yesterday, but still has no control- we discussed doing suppository/bowel program- pt refused for now , but said  he'd "think about it".  18. Hyperkalemia  9/26- will recheck in AM- is 5.3- however was 4.1- so hesitant to give Lokelma without rechecking 9/27 - K+ 4.2- so likely hemolysis yesterday   10/4- K+ 5.0 yesterday- pending for today 19. Diabetes- on Tfs  9/26- will add SSI, since A1c 6.9 and on Tf's- do q4 hours per protocol.   9/30-10/1 CBGs running well- might need to change if gets diet with MBSS today  10/2- change to TID with meals- and monitor  10/3-10/4 CBGs Adequate control- con't regimen-  ok even with TF's overnight CBG (last 3)  Recent Labs    05/06/23 1629 05/07/23 0649 05/07/23 0703  GLUCAP 114* 145* 147*      I spent a total of 37   minutes on total care today- >50% coordination of care- due to  Reviewing labs when they come back- checking with nursing about them- also reviewed vitals/wgt, and education of pt about bowel program for incontinence-    LOS: 9 days A FACE TO FACE EVALUATION WAS PERFORMED  Destony Prevost 05/07/2023, 8:20 AM

## 2023-05-08 DIAGNOSIS — K5901 Slow transit constipation: Secondary | ICD-10-CM

## 2023-05-08 DIAGNOSIS — I69391 Dysphagia following cerebral infarction: Secondary | ICD-10-CM

## 2023-05-08 DIAGNOSIS — N189 Chronic kidney disease, unspecified: Secondary | ICD-10-CM

## 2023-05-08 DIAGNOSIS — N179 Acute kidney failure, unspecified: Secondary | ICD-10-CM

## 2023-05-08 LAB — GLUCOSE, CAPILLARY
Glucose-Capillary: 160 mg/dL — ABNORMAL HIGH (ref 70–99)
Glucose-Capillary: 105 mg/dL — ABNORMAL HIGH (ref 70–99)
Glucose-Capillary: 109 mg/dL — ABNORMAL HIGH (ref 70–99)
Glucose-Capillary: 127 mg/dL — ABNORMAL HIGH (ref 70–99)

## 2023-05-08 MED ORDER — FREE WATER
300.0000 mL | Status: DC
  Administered 2023-05-08 – 2023-05-18 (×56): 300 mL

## 2023-05-08 NOTE — Plan of Care (Signed)
  Problem: RH SKIN INTEGRITY Goal: RH STG SKIN FREE OF INFECTION/BREAKDOWN Description: Skin will remain intact and free of breakdown with min assist  Outcome: Progressing   Problem: RH SAFETY Goal: RH STG ADHERE TO SAFETY PRECAUTIONS W/ASSISTANCE/DEVICE Description: STG Adhere to Safety Precautions With min Assistance/Device. Outcome: Progressing   Problem: RH PAIN MANAGEMENT Goal: RH STG PAIN MANAGED AT OR BELOW PT'S PAIN GOAL Description: Less than 4 with PRN medications min assist  Outcome: Progressing

## 2023-05-08 NOTE — Progress Notes (Signed)
PROGRESS NOTE   Subjective/Complaints:  Pt up in bed. No new issues overnight. Indicates he's comfortable.   ROS: Patient denies fever, rash, sore throat, blurred vision, dizziness, nausea, vomiting, diarrhea, cough, shortness of breath or chest pain, joint or back/neck pain, headache, or mood change.    Objective:   No results found. No results for input(s): "WBC", "HGB", "HCT", "PLT" in the last 72 hours.   Recent Labs    05/06/23 0718 05/07/23 0803  NA 135 134*  K 5.0 4.5  CL 99 100  CO2 27 26  GLUCOSE 150* 161*  BUN 42* 33*  CREATININE 1.55* 1.31*  CALCIUM 9.4 9.2    Intake/Output Summary (Last 24 hours) at 05/08/2023 0901 Last data filed at 05/08/2023 0844 Gross per 24 hour  Intake 594 ml  Output 700 ml  Net -106 ml         Physical Exam: Vital Signs Blood pressure 128/64, pulse (!) 55, temperature 97.8 F (36.6 C), resp. rate 17, height 5\' 7"  (1.702 m), weight 71.2 kg, SpO2 99%.       Constitutional: No distress . Vital signs reviewed. HEENT: NCAT, EOMI, oral membranes moist, cortrak Neck: supple Cardiovascular: RRR without murmur. No JVD    Respiratory/Chest: CTA Bilaterally without wheezes or rales. Normal effort    GI/Abdomen: BS +, non-tender, non-distended Ext: no clubbing, cyanosis, or edema Psych:flat but cooperative  Neuro: Oriented to person place but not time. Language more fluid and spontaneous. Speech clear. RUE: 4-/5 Deltoid, 4-/5 Biceps, 4-/5 Triceps, 4-/5 Grip--no significant change LUE: 5/5 Deltoid, 5/5 Biceps, 5/5 Triceps,  5/5 Grip RLE: HF 4/5, KE 4+/5, ADF 4+/5, APF 4+/5 LLE: HF 5/5, KE 5/5, ADF 5/5, APF 5/5 Incision intact light touch in all 4 extremities Musculoskeletal: no pain with PROM. Right upper extremity fifth digit contracture-wife reports this is chronic   Assessment/Plan: 1. Functional deficits which require 3+ hours per day of interdisciplinary therapy in a  comprehensive inpatient rehab setting. Physiatrist is providing close team supervision and 24 hour management of active medical problems listed below. Physiatrist and rehab team continue to assess barriers to discharge/monitor patient progress toward functional and medical goals  Care Tool:  Bathing    Body parts bathed by patient: Right arm, Left arm, Chest, Abdomen, Front perineal area, Right upper leg, Left upper leg, Face   Body parts bathed by helper: Left lower leg, Right lower leg, Buttocks     Bathing assist Assist Level: Moderate Assistance - Patient 50 - 74%     Upper Body Dressing/Undressing Upper body dressing   What is the patient wearing?: Pull over shirt    Upper body assist Assist Level: Moderate Assistance - Patient 50 - 74%    Lower Body Dressing/Undressing Lower body dressing      What is the patient wearing?: Pants, Incontinence brief     Lower body assist Assist for lower body dressing: Moderate Assistance - Patient 50 - 74%     Toileting Toileting    Toileting assist Assist for toileting: Maximal Assistance - Patient 25 - 49%     Transfers Chair/bed transfer  Transfers assist     Chair/bed transfer assist level: Contact  Guard/Touching assist     Locomotion Ambulation   Ambulation assist      Assist level: Contact Guard/Touching assist Assistive device: No Device Max distance: 150'   Walk 10 feet activity   Assist     Assist level: Contact Guard/Touching assist Assistive device: No Device   Walk 50 feet activity   Assist    Assist level: Contact Guard/Touching assist Assistive device: No Device    Walk 150 feet activity   Assist    Assist level: Contact Guard/Touching assist Assistive device: No Device    Walk 10 feet on uneven surface  activity   Assist     Assist level: Minimal Assistance - Patient > 75%     Wheelchair     Assist Is the patient using a wheelchair?: Yes Type of Wheelchair:  Manual    Wheelchair assist level: Dependent - Patient 0% Max wheelchair distance: 150'    Wheelchair 50 feet with 2 turns activity    Assist        Assist Level: Dependent - Patient 0%   Wheelchair 150 feet activity     Assist      Assist Level: Dependent - Patient 0%   Blood pressure 128/64, pulse (!) 55, temperature 97.8 F (36.6 C), resp. rate 17, height 5\' 7"  (1.702 m), weight 71.2 kg, SpO2 99%.  Medical Problem List and Plan: 1. 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  Etiology suspected to be A-fib not on anticoagulation versus large vessel disease             -patient may shower             -ELOS/Goals: 10 to 14 days, supervision to min assist with PT, OT, SLP          D/c date set for 05/13/23  -Continue CIR therapies including PT, OT, and SLP  2.  Antithrombotics: -DVT/anticoagulation:  Pharmaceutical: Heparin initiated for anticoagulation. PLAN TO TRANSITION TO ELIQUIS 5 MG BID ONCE ABLE TO SWALLOW. 9/28 still NPO 10/1- per pharmacy change change to Eliquis- stopped ASA and Heparin and added Eliquis - but then realized wasn't clear- called pharmacy again- will reach out to Dr Pearlean Brownie and see if need ASA at all? 10/2- ASA stopped per Dr Pearlean Brownie             -antiplatelet therapy: Aspirin 325 mg daily 3. Pain Management: tylenol as needed  9/30- denies pain- con't regimen 4. Mood/Behavior/Sleep: Provide emotional support             -antipsychotic agents: N/A 5. Neuropsych/cognition: This patient is not capable of making decisions on his own behalf. 6. Skin/Wound Care: Routine skin checks 7. Fluids/Electrolytes/Nutrition: Routine and analysis with follow-up chemistries 8.  Dysphagia.  Currently NPO, Cortrak. Speech therapy follow-up.  9/27- pulled cortrak accidentally last night- out a few inches- per nursing- KUB looks OK- reviewed it- can use- mittens? Recheck modified barium swallow on  Tuesday, October 1  10/1- pending MBSS  10/2- now on D3 nectar thick diet  10/5- Diet remains D1 with nectar thick. Continue TF also 9.  Hypertension.  Imdur 60 mg daily.  Monitor with increased mobility.  Long-term blood pressure goal normotensive  9/26- will change Imdur to immediate release- d/w pharmacy- changed  10/5 bp controlled Vitals:   05/07/23 1938 05/08/23 0510  BP: (!) 156/94 128/64  Pulse: (!) 51 (!) 55  Resp: 17 17  Temp: 98.4 F (36.9  C) 97.8 F (36.6 C)  SpO2: 100% 99%    10.  Bradycardia into the 40s.  Coreg held.  Avoid negative chronotropic medications.   Monitor with increased mobility  9/30-10/1- doing slightly better- in 50s- not 40's  10/2-10/5 HR in 50s-60's - con't regimen 11.  Hyperlipidemia.  Pravachol 12.  CKD stage IIIa.  Follow-up chemistries  9/30- Cr 1.48- up from 1.30- BUN up to 29- will increase Free water to 250 cc q4 - don't want to increase further due to [facility]-- and see how goes in labs tomorrow-   10/1- BUN up to 34 and Cr 1.54 up from 1.48- will consult nutritionist  for free water changes-   10/2- spoke with nutritonist- will change TF's to night time only- maintain fluids, but decrease protein- also adding supplements if doesn't eat enough- per pt/wife eating really well- will check BMP in AM  10/3- Pt's food downgraded to D1 diet- also BUN up to 42 up from 34 and Cr stable at 1.55- will give low dose IVFs to try and help turn this Around- will do 75cc for 12 hours 5pm to 5am- and recheck in AM with BMP  10/4- pending labs this AM- pt doesn't have labs drawn yet- pending  10/5 improved BUN/Cr yesterday--will increase free water thru tube today   -recheck labs Monday     Latest Ref Rng & Units 05/07/2023    8:03 AM 05/06/2023    7:18 AM 05/04/2023    5:57 AM  BMP  Glucose 70 - 99 mg/dL 161  096  045   BUN 8 - 23 mg/dL 33  42  34   Creatinine 0.61 - 1.24 mg/dL 4.09  8.11  9.14   Sodium 135 - 145 mmol/L 134  135  136   Potassium 3.5 - 5.1  mmol/L 4.5  5.0  4.9   Chloride 98 - 111 mmol/L 100  99  101   CO2 22 - 32 mmol/L 26  27  26    Calcium 8.9 - 10.3 mg/dL 9.2  9.4  9.7     13.  Diastolic congestive heart failure.  Monitor for any signs of fluid overload. 9/28- stable   9/30- weight stable- 10/1- no weight today- will d/w nursing   10/2- weight up slightly- will monitor for trend  10/3- 71.3 kg today- up 1 lb from yesterday-if goes above 73 kg, will give Lasix  10/5- Weight 71.2 kg this AM- stable Filed Weights   05/06/23 0657 05/07/23 0501 05/08/23 0702  Weight: 71.3 kg 71.8 kg 71.2 kg    14.  History of tobacco/alcohol use/Cocaine.  Provide counseling 15.  Medical noncompliance.  Counseling 16.  Atrial fibrillation.  Hold Coreg due to bradycardia.  Continue ASA and heparin until able to swallow and can switch to Eliquis  10/1- changed to Eliquis per Pharmacy- can do- can be crushed- stopped ASA and heparin   17. Constipation  9/26- will give sorbitol after therapy today  9/27- no results with sorbitol- will give another Sorbitol but 60cc and then soap suds enema if no results  10/1- cannot tell when having BM- has no control per wife- last went yesterday  10/4- LBM yesterday, but still has no control- we discussed doing suppository/bowel program- pt refused for now , but said he'd "think about it".   10/5 if no bm today will intervene tomorrow 18. Hyperkalemia  9/26- will recheck in AM- is 5.3- however was 4.1- so hesitant to give [facility] without rechecking  9/27 - K+ 4.2- so likely hemolysis yesterday   10/4- K+ 5.0 yesterday- pending for today 19. Diabetes- on Tfs  9/26- will add SSI, since A1c 6.9 and on Tf's- do q4 hours per protocol.   9/30-10/1 CBGs running well- might need to change if gets diet with MBSS today  10/2- change to TID with meals- and monitor  10/3-10/5 CBGs reasonable control--no changes CBG (last 3)  Recent Labs    05/07/23 1637 05/07/23 2128 05/08/23 0621  GLUCAP 114* 159* 160*        LOS: 10 days A FACE TO FACE EVALUATION WAS PERFORMED  Ranelle Oyster 05/08/2023, 9:01 AM

## 2023-05-08 NOTE — Plan of Care (Signed)
  Problem: RH BOWEL ELIMINATION Goal: RH STG MANAGE BOWEL WITH ASSISTANCE Description: STG Manage Bowel with min Assistance. Outcome: Progressing   Problem: RH BLADDER ELIMINATION Goal: RH STG MANAGE BLADDER WITH ASSISTANCE Description: STG Manage Bladder With min Assistance Outcome: Progressing   Problem: RH SKIN INTEGRITY Goal: RH STG SKIN FREE OF INFECTION/BREAKDOWN Description: Skin will remain intact and free of breakdown with min assist  Outcome: Progressing Goal: RH STG MAINTAIN SKIN INTEGRITY WITH ASSISTANCE Description: STG Maintain Skin Integrity With min Assistance. Outcome: Progressing   Problem: RH SAFETY Goal: RH STG ADHERE TO SAFETY PRECAUTIONS W/ASSISTANCE/DEVICE Description: STG Adhere to Safety Precautions With min Assistance/Device. Outcome: Progressing Goal: RH STG DECREASED RISK OF FALL WITH ASSISTANCE Description: STG Decreased Risk of Fall With min Assistance. Outcome: Progressing   Problem: RH PAIN MANAGEMENT Goal: RH STG PAIN MANAGED AT OR BELOW PT'S PAIN GOAL Description: Less than 4 with PRN medications min assist  Outcome: Progressing

## 2023-05-09 LAB — GLUCOSE, CAPILLARY
Glucose-Capillary: 191 mg/dL — ABNORMAL HIGH (ref 70–99)
Glucose-Capillary: 75 mg/dL (ref 70–99)
Glucose-Capillary: 87 mg/dL (ref 70–99)

## 2023-05-09 MED ORDER — POLYETHYLENE GLYCOL 3350 17 G PO PACK
17.0000 g | PACK | Freq: Every day | ORAL | Status: DC
  Administered 2023-05-09 – 2023-05-19 (×10): 17 g via ORAL
  Filled 2023-05-09 (×11): qty 1

## 2023-05-09 MED ORDER — SENNOSIDES-DOCUSATE SODIUM 8.6-50 MG PO TABS
2.0000 | ORAL_TABLET | Freq: Every day | ORAL | Status: DC
  Administered 2023-05-09 – 2023-05-18 (×10): 2 via ORAL
  Filled 2023-05-09 (×9): qty 2

## 2023-05-09 MED ORDER — OSMOLITE 1.5 CAL PO LIQD
490.0000 mL | ORAL | Status: DC
  Administered 2023-05-09 – 2023-05-17 (×9): 490 mL
  Filled 2023-05-09 (×7): qty 711

## 2023-05-09 NOTE — Plan of Care (Signed)
  Problem: Consults Goal: RH STROKE PATIENT EDUCATION Description: See Patient Education module for education specifics  Outcome: Progressing   Problem: RH BOWEL ELIMINATION Goal: RH STG MANAGE BOWEL WITH ASSISTANCE Description: STG Manage Bowel with min Assistance. Outcome: Progressing   Problem: RH BLADDER ELIMINATION Goal: RH STG MANAGE BLADDER WITH ASSISTANCE Description: STG Manage Bladder With min Assistance Outcome: Progressing   Problem: RH SKIN INTEGRITY Goal: RH STG SKIN FREE OF INFECTION/BREAKDOWN Description: Skin will remain intact and free of breakdown with min assist  Outcome: Progressing Goal: RH STG MAINTAIN SKIN INTEGRITY WITH ASSISTANCE Description: STG Maintain Skin Integrity With min Assistance. Outcome: Progressing Goal: RH STG ABLE TO PERFORM INCISION/WOUND CARE W/ASSISTANCE Description: STG Able To Perform Incision/Wound Care With min Assistance. Outcome: Progressing   Problem: RH SAFETY Goal: RH STG ADHERE TO SAFETY PRECAUTIONS W/ASSISTANCE/DEVICE Description: STG Adhere to Safety Precautions With min Assistance/Device. Outcome: Progressing Goal: RH STG DECREASED RISK OF FALL WITH ASSISTANCE Description: STG Decreased Risk of Fall With min Assistance. Outcome: Progressing   Problem: RH PAIN MANAGEMENT Goal: RH STG PAIN MANAGED AT OR BELOW PT'S PAIN GOAL Description: Less than 4 with PRN medications min assist  Outcome: Progressing

## 2023-05-09 NOTE — Progress Notes (Signed)
Physical Therapy Session Note  Patient Details  Name: Timothy [name] MRN: [id number] Date of Birth: October 23, 1955  Today's Date: 05/09/2023 PT Individual Time: 0900-1012 PT Individual Time Calculation (min): 72 min   Short Term Goals: Week 2:  PT Short Term Goal 1 (Week 2): STG's=LTG's due to ELOS  Skilled Therapeutic Interventions/Progress Updates:      Therapy Documentation Precautions:  Precautions Precautions: Fall Precaution Comments: R inattention; SBP <180, cortrak Restrictions Weight Bearing Restrictions: No  Pt received semi-reclined in bed, agreeable to PT and declines pain.  Pt incontinent of bowel, and supervision with bed mobility and amabulatory transfer to toielt.Pt set-up for upper and lower body bath and demonstrates Right hemi body management with ADL. Pt set-up for upper/lower body dressing.   Pt supervision with gait ~200 ft to main gym and particpated in activities to faciliate right UE activation and hand grip. Pt able to retrieve and pick up bean bags from various heights (ground >waist level) in gym with minimal cues to utliize right hand.   Pt performed corn hole and utilized right hand to grasp bean bags from various directions and demonstrates improved postural control with leaning outside base of support to retrieve bean bags and toss for target. Pt supervision for picking up bean bags from floor with appropriate squat technique. Pt repeated above activity with 1.5# weight to right wrist to address strength deficits.   Pt returned to room by ambulation and left semi-reclined in bed with all needs in reach and alarm on.    Therapy/Group: Individual Therapy  [name] [name] PT, DPT  05/09/2023, 7:40 AM

## 2023-05-09 NOTE — Progress Notes (Signed)
PROGRESS NOTE   Subjective/Complaints:  Pt without complaints today. No problems reported by nurse. Pt just waking up when I stopped in  ROS: limited due to language/communication .    Objective:   No results found. No results for input(s): "WBC", "HGB", "HCT", "PLT" in the last 72 hours.   Recent Labs    05/07/23 0803  NA 134*  K 4.5  CL 100  CO2 26  GLUCOSE 161*  BUN 33*  CREATININE 1.31*  CALCIUM 9.2    Intake/Output Summary (Last 24 hours) at 05/09/2023 0819 Last data filed at 05/09/2023 0757 Gross per 24 hour  Intake 594 ml  Output 950 ml  Net -356 ml         Physical Exam: Vital Signs Blood pressure 119/80, pulse (!) 55, temperature (!) 97.5 F (36.4 C), resp. rate 19, height 5\' 7"  (1.702 m), weight 71.2 kg, SpO2 99%.       Constitutional: No distress . Vital signs reviewed. HEENT: NCAT, EOMI, oral membranes moist, NGT Neck: supple Cardiovascular: RRR without murmur. No JVD    Respiratory/Chest: CTA Bilaterally without wheezes or rales. Normal effort    GI/Abdomen: BS +, non-tender, non-distended Ext: no clubbing, cyanosis, or edema Psych: flat but cooperative  Neuro: Oriented to person place but not time. Language more fluid and spontaneous. Speech clear. RUE: 4-/5 Deltoid, 4-/5 Biceps, 4-/5 Triceps, 4-/5 Grip--no significant change LUE: 5/5 Deltoid, 5/5 Biceps, 5/5 Triceps,  5/5 Grip RLE: HF 4/5, KE 4+/5, ADF 4+/5, APF 4+/5 LLE: HF 5/5, KE 5/5, ADF 5/5, APF 5/5 Incision intact light touch in all 4 extremities. Neurological exam stable today 05/09/2023 Musculoskeletal: no pain with PROM. Right upper extremity fifth digit contracture-wife reports this is chronic   Assessment/Plan: 1. Functional deficits which require 3+ hours per day of interdisciplinary therapy in a comprehensive inpatient rehab setting. Physiatrist is providing close team supervision and 24 hour management of active  medical problems listed below. Physiatrist and rehab team continue to assess barriers to discharge/monitor patient progress toward functional and medical goals  Care Tool:  Bathing    Body parts bathed by patient: Right arm, Left arm, Chest, Abdomen, Front perineal area, Right upper leg, Left upper leg, Face   Body parts bathed by helper: Left lower leg, Right lower leg, Buttocks     Bathing assist Assist Level: Moderate Assistance - Patient 50 - 74%     Upper Body Dressing/Undressing Upper body dressing   What is the patient wearing?: Pull over shirt    Upper body assist Assist Level: Moderate Assistance - Patient 50 - 74%    Lower Body Dressing/Undressing Lower body dressing      What is the patient wearing?: Pants, Incontinence brief     Lower body assist Assist for lower body dressing: Moderate Assistance - Patient 50 - 74%     Toileting Toileting    Toileting assist Assist for toileting: Maximal Assistance - Patient 25 - 49%     Transfers Chair/bed transfer  Transfers assist     Chair/bed transfer assist level: Contact Guard/Touching assist     Locomotion Ambulation   Ambulation assist      Assist level: Contact  Guard/Touching assist Assistive device: No Device Max distance: 150'   Walk 10 feet activity   Assist     Assist level: Contact Guard/Touching assist Assistive device: No Device   Walk 50 feet activity   Assist    Assist level: Contact Guard/Touching assist Assistive device: No Device    Walk 150 feet activity   Assist    Assist level: Contact Guard/Touching assist Assistive device: No Device    Walk 10 feet on uneven surface  activity   Assist     Assist level: Minimal Assistance - Patient > 75%     Wheelchair     Assist Is the patient using a wheelchair?: Yes Type of Wheelchair: Manual    Wheelchair assist level: Dependent - Patient 0% Max wheelchair distance: 150'    Wheelchair 50 feet with 2  turns activity    Assist        Assist Level: Dependent - Patient 0%   Wheelchair 150 feet activity     Assist      Assist Level: Dependent - Patient 0%   Blood pressure 119/80, pulse (!) 55, temperature (!) 97.5 F (36.4 C), resp. rate 19, height 5\' 7"  (1.702 m), weight 71.2 kg, SpO2 99%.  Medical Problem List and Plan: 1. 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  Etiology suspected to be A-fib not on anticoagulation versus large vessel disease             -patient may shower             -ELOS/Goals: 10 to 14 days, supervision to min assist with PT, OT, SLP          D/c date set for 05/13/23  -Continue CIR therapies including PT, OT, and SLP  2.  Antithrombotics: -DVT/anticoagulation:  Pharmaceutical: Heparin initiated for anticoagulation. PLAN TO TRANSITION TO ELIQUIS 5 MG BID ONCE ABLE TO SWALLOW. 9/28 still NPO 10/1- per pharmacy change change to Eliquis- stopped ASA and Heparin and added Eliquis - but then realized wasn't clear- called pharmacy again- will reach out to Dr Pearlean Brownie and see if need ASA at all? 10/2- ASA stopped per Dr Pearlean Brownie             -antiplatelet therapy: Aspirin 325 mg daily 3. Pain Management: tylenol as needed  9/30- denies pain- con't regimen 4. Mood/Behavior/Sleep: Provide emotional support             -antipsychotic agents: N/A 5. Neuropsych/cognition: This patient is not capable of making decisions on his own behalf. 6. Skin/Wound Care: Routine skin checks 7. Fluids/Electrolytes/Nutrition: Routine and analysis with follow-up chemistries 8.  Dysphagia.  Currently NPO, Cortrak. Speech therapy follow-up.  9/27- pulled cortrak accidentally last night- out a few inches- per nursing- KUB looks OK- reviewed it- can use- mittens? Recheck modified barium swallow on Tuesday, October 1  10/1- pending MBSS  10/2- now on D3 nectar thick diet  10/5- Diet remains D1 with nectar thick.  Continue TF also  10/6 pt ate 25-50-65% yesterday, will reduce TF to 35cc/hr 9.  Hypertension.  Imdur 60 mg daily.  Monitor with increased mobility.  Long-term blood pressure goal normotensive  9/26- will change Imdur to immediate release- d/w pharmacy- changed  10/6 bp controlled Vitals:   05/08/23 1936 05/09/23 0503  BP: 134/85 119/80  Pulse: 61 (!) 55  Resp: 17 19  Temp: 97.8 F (36.6 C) (!) 97.5 F (36.4 C)  SpO2: 99% 99%  10.  Bradycardia into the 40s.  Coreg held.  Avoid negative chronotropic medications.   Monitor with increased mobility  9/30-10/1- doing slightly better- in 50s- not 40's  10/2-10/5 HR in 50s-60's - con't regimen 11.  Hyperlipidemia.  Pravachol 12.  CKD stage IIIa.  Follow-up chemistries  9/30- Cr 1.48- up from 1.30- BUN up to 29- will increase Free water to 250 cc q4 - don't want to increase further due to [facility]-- and see how goes in labs tomorrow-   10/1- BUN up to 34 and Cr 1.54 up from 1.48- will consult nutritionist  for free water changes-   10/2- spoke with nutritonist- will change TF's to night time only- maintain fluids, but decrease protein- also adding supplements if doesn't eat enough- per pt/wife eating really well- will check BMP in AM  10/3- Pt's food downgraded to D1 diet- also BUN up to 42 up from 34 and Cr stable at 1.55- will give low dose IVFs to try and help turn this Around- will do 75cc for 12 hours 5pm to 5am- and recheck in AM with BMP  10/4- pending labs this AM- pt doesn't have labs drawn yet- pending  10/5 improved BUN/Cr yesterday--will increase free water thru tube today   10/6 pt ate better yesterday   -cut nocturnal TF in half   -continue increased H20 flushes   -labs tomorrow    Latest Ref Rng & Units 05/07/2023    8:03 AM 05/06/2023    7:18 AM 05/04/2023    5:57 AM  BMP  Glucose 70 - 99 mg/dL 161  096  045   BUN 8 - 23 mg/dL 33  42  34   Creatinine 0.61 - 1.24 mg/dL 4.09  8.11  9.14   Sodium 135 - 145 mmol/L 134  135  136    Potassium 3.5 - 5.1 mmol/L 4.5  5.0  4.9   Chloride 98 - 111 mmol/L 100  99  101   CO2 22 - 32 mmol/L 26  27  26    Calcium 8.9 - 10.3 mg/dL 9.2  9.4  9.7     13.  Diastolic congestive heart failure.  Monitor for any signs of fluid overload. 9/28- stable   9/30- weight stable- 10/1- no weight today- will d/w nursing   10/2- weight up slightly- will monitor for trend  10/3- 71.3 kg today- up 1 lb from yesterday-if goes above 73 kg, will give Lasix  10/5- Weight 71.2 kg this AM- stable Filed Weights   05/06/23 0657 05/07/23 0501 05/08/23 0702  Weight: 71.3 kg 71.8 kg 71.2 kg    14.  History of tobacco/alcohol use/Cocaine.  Provide counseling 15.  Medical noncompliance.  Counseling 16.  Atrial fibrillation.  Hold Coreg due to bradycardia.  Continue ASA and heparin until able to swallow and can switch to Eliquis  10/1- changed to Eliquis per Pharmacy- can do- can be crushed- stopped ASA and heparin   17. Constipation  9/26- will give sorbitol after therapy today  9/27- no results with sorbitol- will give another Sorbitol but 60cc and then soap suds enema if no results  10/1- cannot tell when having BM- has no control per wife- last went yesterday  10/4- LBM yesterday, but still has no control- we discussed doing suppository/bowel program- pt refused for now , but said he'd "think about it".   10/6- no bm since 10/3--add miralax and senokot-s at HS.  18. Hyperkalemia  9/26- will recheck in AM- is 5.3- however  was 4.1- so hesitant to give Lokelma without rechecking 9/27 - K+ 4.2- so likely hemolysis yesterday  10/6 k+ 4.5 10/4 19. Diabetes- on Tfs  9/26- will add SSI, since A1c 6.9 and on Tf's- do q4 hours per protocol.   9/30-10/1 CBGs running well- might need to change if gets diet with MBSS today  10/2- change to TID with meals- and monitor  10/3-10/5 CBGs reasonable control--no changes CBG (last 3)  Recent Labs    05/08/23 1647 05/08/23 2117 05/09/23 0632  GLUCAP 109* 127*  191*       LOS: 11 days A FACE TO FACE EVALUATION WAS PERFORMED  Ranelle Oyster 05/09/2023, 8:19 AM

## 2023-05-09 NOTE — Progress Notes (Signed)
Speech Language Pathology Daily Session Note  Patient Details  Name: Timothy [name] MRN: [id number] Date of Birth: 02/25/1956  Today's Date: 05/09/2023 SLP Individual Time: 1105-1200 SLP Individual Time Calculation (min): 55 min  Short Term Goals: Week 2: SLP Short Term Goal 1 (Week 2): STGs=LTGs d/t ELOS  Skilled Therapeutic Interventions:  Pt was seen for skilled ST targeting goals for dysphagia and communication.  Pt was resting upon arrival but awakened easily to voice and was agreeable to participating in treatment.  SLP facilitated the session with trials of ice chips and 5 cc Provale cup sips of water following oral care to continue working towards diet progression.  Pt had delayed coughing following ~10 ice chips but no overt s/s of aspiration with sips of water.  Suspect rate and portion control were more consistent with use of Provale rather than self administered teaspoons of ice chips.  SLP also facilitated the session with structured naming tasks to address word finding/verbal expression goals.  Pt required total assist to name x1 specific member of a targeted category but was able to complete opposite pairs and familiar idioms with targeted words with mod-max assist.  Pt was able to name familiar objects from around his room with min-mod assist.  Verbal expression during informal conversational exchanges with SLP was vague and automatic in nature.  Pt was left in bed with bed alarm set.  Continue per current plan of care.    Pain Pain Assessment Pain Scale: 0-10 Pain Score: 0-No pain  Therapy/Group: Individual Therapy  Moriah Loughry, Melanee Spry 05/09/2023, 11:47 AM

## 2023-05-09 NOTE — Progress Notes (Addendum)
Occupational Therapy Session Note  Patient Details  Name: Timothy [name] MRN: [id number] Date of Birth: [birth year]  Today's Date: 05/09/2023 OT Individual Time: 1301-1413 OT Individual Time Calculation (min): 72 min    Short Term Goals: Week 2:  OT Short Term Goal 1 (Week 2): STG=LTG d/t ELOS  Skilled Therapeutic Interventions/Progress Updates:      Therapy Documentation Precautions:  Precautions Precautions: Fall Precaution Comments: R inattention; SBP <180, cortrak Restrictions Weight Bearing Restrictions: No General: "Hello!" Pt supine in bed upon OT arrival, agreeable to OT session. Pt offered shower for increased hygiene, declined shower this date.  Pain: no pain reported  ADL: Grooming: SBA with VC for initiation for washing hands and wiping food off of face Toilet transfer: SBA ambulating from bed> toilet with no AD, decreased Rt side awareness, VC to correct Toileting: SBA for standing to urinate, able to manipulate pants Transfers: SBA ambulating community distances throughout therapy unit without AD no LOB/SOB  Exercises: Pt completed the following exercises  for increased dexterity and coordination in order to complete ADLs such as buttoning shirts. Pt completed tasks with PRN rest breaks d/t increased fatigue in RUE: -Pt participated in [facility] activity with yellow theraputty with RUE. Pt initially rolling putty into log in order to increase proprioceptive input to Rt hand. Pt then completing putty pinched with index and thumb in order to increase fine pincer grasp.  -fastening nuts/bolts, utilizing dual tasking using Rt hand to stabilize screw and Lt hand screwing bolts and vice versa, VC for positioning of pincer grasp, demo increased difficulty maintaining grasp with Rt hand and difficulty with ideomotor apraxia. Pt with increased time to complete task, OT providing occasional hand over hand assistance -placing large pegs into peg board, pt using Lt hand to give Rt  hand large peg to place into peg board, pt demo'd increased independence with task, not requiring hand over hand assistance for task. Pt still displaying difficulty with pincer grasp, using modified grasp with primarily middle finger d/t decreased proprioceptive input with grasping too hard   Pt supine in bed with bed alarm activated, 2 bed rails up, call light within reach and 4Ps assessed.    Therapy/Group: Individual Therapy  Velia Meyer, OTD, OTR/L 05/09/2023, 2:15 PM

## 2023-05-10 LAB — GLUCOSE, CAPILLARY
Glucose-Capillary: 141 mg/dL — ABNORMAL HIGH (ref 70–99)
Glucose-Capillary: 98 mg/dL (ref 70–99)
Glucose-Capillary: 95 mg/dL (ref 70–99)
Glucose-Capillary: 115 mg/dL — ABNORMAL HIGH (ref 70–99)

## 2023-05-10 LAB — CBC WITH DIFFERENTIAL/PLATELET
Abs Immature Granulocytes: 0.01 10*3/uL (ref 0.00–0.07)
Basophils Absolute: 0.1 10*3/uL (ref 0.0–0.1)
Basophils Relative: 2 %
Eosinophils Absolute: 0.3 10*3/uL (ref 0.0–0.5)
Eosinophils Relative: 5 %
HCT: 42.2 % (ref 39.0–52.0)
Hemoglobin: 14 g/dL (ref 13.0–17.0)
Immature Granulocytes: 0 %
Lymphocytes Relative: 35 %
Lymphs Abs: 1.8 10*3/uL (ref 0.7–4.0)
MCH: 28.2 pg (ref 26.0–34.0)
MCHC: 33.2 g/dL (ref 30.0–36.0)
MCV: 84.9 fL (ref 80.0–100.0)
Monocytes Absolute: 0.5 10*3/uL (ref 0.1–1.0)
Monocytes Relative: 10 %
Neutro Abs: 2.4 10*3/uL (ref 1.7–7.7)
Neutrophils Relative %: 48 %
Platelets: 142 10*3/uL — ABNORMAL LOW (ref 150–400)
RBC: 4.97 MIL/uL (ref 4.22–5.81)
RDW: 13.8 % (ref 11.5–15.5)
WBC: 5.2 10*3/uL (ref 4.0–10.5)
nRBC: 0 % (ref 0.0–0.2)

## 2023-05-10 LAB — COMPREHENSIVE METABOLIC PANEL WITH GFR
ALT: 21 U/L (ref 0–44)
AST: 21 U/L (ref 15–41)
Albumin: 3.3 g/dL — ABNORMAL LOW (ref 3.5–5.0)
Alkaline Phosphatase: 54 U/L (ref 38–126)
Anion gap: 11 (ref 5–15)
BUN: 34 mg/dL — ABNORMAL HIGH (ref 8–23)
CO2: 25 mmol/L (ref 22–32)
Calcium: 9 mg/dL (ref 8.9–10.3)
Chloride: 98 mmol/L (ref 98–111)
Creatinine, Ser: 1.42 mg/dL — ABNORMAL HIGH (ref 0.61–1.24)
GFR, Estimated: 53 mL/min — ABNORMAL LOW
Glucose, Bld: 144 mg/dL — ABNORMAL HIGH (ref 70–99)
Potassium: 4.4 mmol/L (ref 3.5–5.1)
Sodium: 134 mmol/L — ABNORMAL LOW (ref 135–145)
Total Bilirubin: 0.6 mg/dL (ref 0.3–1.2)
Total Protein: 6.2 g/dL — ABNORMAL LOW (ref 6.5–8.1)

## 2023-05-10 LAB — PHOSPHORUS: Phosphorus: 4.5 mg/dL (ref 2.5–4.6)

## 2023-05-10 LAB — MAGNESIUM: Magnesium: 2.1 mg/dL (ref 1.7–2.4)

## 2023-05-10 MED ORDER — SORBITOL 70 % SOLN: 15.0000 mL | Freq: Every day | Status: DC | PRN

## 2023-05-10 MED ORDER — ACETAMINOPHEN 325 MG PO TABS: 650.0000 mg | ORAL_TABLET | ORAL | Status: DC | PRN

## 2023-05-10 NOTE — Progress Notes (Signed)
Occupational Therapy Session Note  Patient Details  Name: Timothy [name] MRN: [id number] Date of Birth: January 08, 1956  Today's Date: 05/10/2023 OT Individual Time: 8295-6213 OT Individual Time Calculation (min): 59 min    Short Term Goals: Week 2:  OT Short Term Goal 1 (Week 2): STG=LTG d/t ELOS  Skilled Therapeutic Interventions/Progress Updates:      Therapy Documentation Precautions:  Precautions Precautions: Fall Precaution Comments: R inattention; SBP <180, cortrak Restrictions Weight Bearing Restrictions: No General: "Hi Hi" Pt supine in bed upon OT arrival, agreeable to OT session. Wife present  Pain: no pain reported  ADL: Bed mobility: SBA supine><sit Toilet transfer: SBA with no AD ambulating from bed>toilet no LOB Toileting: SBA, able to manipulate pants with LUE, attempts with RUE although lacking coordination UB dressing: SBA, assistance with NG tube manipulation LB dressing: SBA seated for threading over legs with reinforcement of figure method with increased ability to complete LB dressing, standing to manipulate over waist, no AD required Footwear: SBA doffing/donning socks Shower transfer: SBA with no AD from toilet>shower Bathing: SBA, seated on TTB with long handled sponge for LE and removable shower head, standing to complete peri cleaning   Pt supine in bed with bed alarm activated, 2 bed rails up, call light within reach and 4Ps assessed.   Therapy/Group: Individual Therapy  Velia Meyer, OTD, OTR/L 05/10/2023, 4:26 PM

## 2023-05-10 NOTE — Progress Notes (Addendum)
PROGRESS NOTE   Subjective/Complaints:  Pt reports eating all food given- per pt AND wife- nursing also reports eating pretty much everything.   BUN/Cr stable- same as Plts overall.   LBM yesterday Peeing OK.   ROS: limited due to aphasia.    Objective:   No results found. Recent Labs    05/10/23 0612  WBC 5.2  HGB 14.0  HCT 42.2  PLT 142*     Recent Labs    05/10/23 0612  NA 134*  K 4.4  CL 98  CO2 25  GLUCOSE 144*  BUN 34*  CREATININE 1.42*  CALCIUM 9.0    Intake/Output Summary (Last 24 hours) at 05/10/2023 0856 Last data filed at 05/10/2023 0847 Gross per 24 hour  Intake 348 ml  Output 1025 ml  Net -677 ml         Physical Exam: Vital Signs Blood pressure 120/78, pulse (!) 54, temperature 98 F (36.7 C), resp. rate 17, height 5\' 7"  (1.702 m), weight 71.8 kg, SpO2 100%.      General: awake, alert, appropriate, slightly sitting ; wife at bedside; NAD HENT: conjugate gaze; oropharynx moist- cortrak in place CV: rate controlled, bradycardic rate; no JVD Pulmonary: CTA B/L; no W/R/R- good air movement GI: soft, NT, ND, (+)BS Psychiatric: appropriate; flat Neurological: aphasic- speaks in word level Ext: no clubbing, cyanosis, or edema Psych: flat but cooperative  Neuro: Oriented to person place but not time. Language more fluid and spontaneous. Speech clear. RUE: 4-/5 Deltoid, 4-/5 Biceps, 4-/5 Triceps, 4-/5 Grip--no significant change LUE: 5/5 Deltoid, 5/5 Biceps, 5/5 Triceps,  5/5 Grip RLE: HF 4/5, KE 4+/5, ADF 4+/5, APF 4+/5 LLE: HF 5/5, KE 5/5, ADF 5/5, APF 5/5 Incision intact light touch in all 4 extremities. Neurological exam stable today 05/10/2023 Musculoskeletal: no pain with PROM. Right upper extremity fifth digit contracture-wife reports this is chronic   Assessment/Plan: 1. Functional deficits which require 3+ hours per day of interdisciplinary therapy in a comprehensive  inpatient rehab setting. Physiatrist is providing close team supervision and 24 hour management of active medical problems listed below. Physiatrist and rehab team continue to assess barriers to discharge/monitor patient progress toward functional and medical goals  Care Tool:  Bathing    Body parts bathed by patient: Right arm, Left arm, Chest, Abdomen, Front perineal area, Right upper leg, Left upper leg, Face   Body parts bathed by helper: Left lower leg, Right lower leg, Buttocks     Bathing assist Assist Level: Moderate Assistance - Patient 50 - 74%     Upper Body Dressing/Undressing Upper body dressing   What is the patient wearing?: Pull over shirt    Upper body assist Assist Level: Moderate Assistance - Patient 50 - 74%    Lower Body Dressing/Undressing Lower body dressing      What is the patient wearing?: Pants, Incontinence brief     Lower body assist Assist for lower body dressing: Moderate Assistance - Patient 50 - 74%     Toileting Toileting    Toileting assist Assist for toileting: Maximal Assistance - Patient 25 - 49%     Transfers Chair/bed transfer  Transfers assist  Chair/bed transfer assist level: Contact Guard/Touching assist     Locomotion Ambulation   Ambulation assist      Assist level: Contact Guard/Touching assist Assistive device: No Device Max distance: 150'   Walk 10 feet activity   Assist     Assist level: Contact Guard/Touching assist Assistive device: No Device   Walk 50 feet activity   Assist    Assist level: Contact Guard/Touching assist Assistive device: No Device    Walk 150 feet activity   Assist    Assist level: Contact Guard/Touching assist Assistive device: No Device    Walk 10 feet on uneven surface  activity   Assist     Assist level: Minimal Assistance - Patient > 75%     Wheelchair     Assist Is the patient using a wheelchair?: Yes Type of Wheelchair: Manual     Wheelchair assist level: Dependent - Patient 0% Max wheelchair distance: 150'    Wheelchair 50 feet with 2 turns activity    Assist        Assist Level: Dependent - Patient 0%   Wheelchair 150 feet activity     Assist      Assist Level: Dependent - Patient 0%   Blood pressure 120/78, pulse (!) 54, temperature 98 F (36.7 C), resp. rate 17, height 5\' 7"  (1.702 m), weight 71.8 kg, SpO2 100%.  Medical Problem List and Plan: 1. 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  Etiology suspected to be A-fib not on anticoagulation versus large vessel disease             -patient may shower             -ELOS/Goals: 10 to 14 days, supervision to min assist with PT, OT, SLP          D/c date set for 05/13/23  Con't CIR PT, OT and SLP Contacted dietitian about Cortrak- and diet- will see if can get cortrak out  2.  Antithrombotics: -DVT/anticoagulation:  Pharmaceutical: Heparin initiated for anticoagulation. PLAN TO TRANSITION TO ELIQUIS 5 MG BID ONCE ABLE TO SWALLOW. 9/28 still NPO 10/1- per pharmacy change change to Eliquis- stopped ASA and Heparin and added Eliquis - but then realized wasn't clear- called pharmacy again- will reach out to Dr Pearlean Brownie and see if need ASA at all? 10/2- ASA stopped per Dr Pearlean Brownie             -antiplatelet therapy: Aspirin 325 mg daily 3. Pain Management: tylenol as needed  9/30- denies pain- con't regimen 4. Mood/Behavior/Sleep: Provide emotional support             -antipsychotic agents: N/A 5. Neuropsych/cognition: This patient is not capable of making decisions on his own behalf. 6. Skin/Wound Care: Routine skin checks 7. Fluids/Electrolytes/Nutrition: Routine and analysis with follow-up chemistries 8.  Dysphagia.  Currently NPO, Cortrak. Speech therapy follow-up.  9/27- pulled cortrak accidentally last night- out a few inches- per nursing- KUB looks OK- reviewed it- can use-  mittens? Recheck modified barium swallow on Tuesday, October 1  10/1- pending MBSS  10/2- now on D3 nectar thick diet  10/5- Diet remains D1 with nectar thick. Continue TF also  10/6 pt ate 25-50-65% yesterday, will reduce TF to 35cc/hr  10/7- pt supposed ot leave 10/10- contacting Dietitian about Cortrak- if need be, can extend- don't think will need PEG? 9.  Hypertension.  Imdur 60 mg daily.  Monitor with increased mobility.  Long-term blood pressure goal normotensive  9/26- will change Imdur to immediate release- d/w pharmacy- changed  10/6 -10/7bp controlled Vitals:   05/09/23 1911 05/10/23 0539  BP: (!) 123/97 120/78  Pulse: 60 (!) 54  Resp: 18 17  Temp: 98.6 F (37 C) 98 F (36.7 C)  SpO2: 100% 100%    10.  Bradycardia into the 40s.  Coreg held.  Avoid negative chronotropic medications.   Monitor with increased mobility  9/30-10/1- doing slightly better- in 50s- not 40's  10/2-10/5 HR in 50s-60's - con't regimen 11.  Hyperlipidemia.  Pravachol 12.  CKD stage IIIa.  Follow-up chemistries  9/30- Cr 1.48- up from 1.30- BUN up to 29- will increase Free water to 250 cc q4 - don't want to increase further due to [facility]-- and see how goes in labs tomorrow-   10/1- BUN up to 34 and Cr 1.54 up from 1.48- will consult nutritionist  for free water changes-   10/2- spoke with nutritonist- will change TF's to night time only- maintain fluids, but decrease protein- also adding supplements if doesn't eat enough- per pt/wife eating really well- will check BMP in AM  10/3- Pt's food downgraded to D1 diet- also BUN up to 42 up from 34 and Cr stable at 1.55- will give low dose IVFs to try and help turn this Around- will do 75cc for 12 hours 5pm to 5am- and recheck in AM with BMP  10/4- pending labs this AM- pt doesn't have labs drawn yet- pending  10/5 improved BUN/Cr yesterday--will increase free water thru tube today   10/6 pt ate better yesterday   -cut nocturnal TF in half   -continue  increased H20 flushes   -labs tomorrow  10/7- BUN/Cr stable- however on water flushes as well- explained to pt will need to take double the amount of Nectar thick fluids, if remove Cortrak. Will d/w SLP and dietitian    Latest Ref Rng & Units 05/10/2023    6:12 AM 05/07/2023    8:03 AM 05/06/2023    7:18 AM  BMP  Glucose 70 - 99 mg/dL 161  096  045   BUN 8 - 23 mg/dL 34  33  42   Creatinine 0.61 - 1.24 mg/dL 4.09  8.11  9.14   Sodium 135 - 145 mmol/L 134  134  135   Potassium 3.5 - 5.1 mmol/L 4.4  4.5  5.0   Chloride 98 - 111 mmol/L 98  100  99   CO2 22 - 32 mmol/L 25  26  27    Calcium 8.9 - 10.3 mg/dL 9.0  9.2  9.4     13.  Diastolic congestive heart failure.  Monitor for any signs of fluid overload. 9/28- stable   9/30- weight stable- 10/1- no weight today- will d/w nursing   10/2- weight up slightly- will monitor for trend  10/3- 71.3 kg today- up 1 lb from yesterday-if goes above 73 kg, will give Lasix  10/5- Weight 71.2 kg this AM- stable  10/7- Weight stable 71.8kg Filed Weights   05/08/23 0702 05/09/23 0708 05/10/23 0539  Weight: 71.2 kg 71.5 kg 71.8 kg    14.  History of tobacco/alcohol use/Cocaine.  Provide counseling 15.  Medical noncompliance.  Counseling 16.  Atrial fibrillation.  Hold Coreg due to bradycardia.  Continue ASA and heparin until able to swallow and can switch to Eliquis  10/1- changed to Eliquis per Pharmacy- can do- can be crushed- stopped ASA and heparin  17. Constipation  9/26- will give sorbitol after therapy today  9/27- no results with sorbitol- will give another Sorbitol but 60cc and then soap suds enema if no results  10/1- cannot tell when having BM- has no control per wife- last went yesterday  10/4- LBM yesterday, but still has no control- we discussed doing suppository/bowel program- pt refused for now , but said he'd "think about it".   10/6- no bm since 10/3--add miralax and senokot-s at HS.   10/7- LBM yesterday 18. Hyperkalemia  9/26-  will recheck in AM- is 5.3- however was 4.1- so hesitant to give Lokelma without rechecking 9/27 - K+ 4.2- so likely hemolysis yesterday  10/6 k+ 4.5 10/4 19. Diabetes- on Tfs  9/26- will add SSI, since A1c 6.9 and on Tf's- do q4 hours per protocol.   9/30-10/1 CBGs running well- might need to change if gets diet with MBSS today  10/2- change to TID with meals- and monitor  10/3-10/5 CBGs reasonable control--no changes  10/7- great control-   CBG (last 3)  Recent Labs    05/09/23 1152 05/09/23 1638 05/10/23 0638  GLUCAP 75 87 141*    I spent a total of 51   minutes on total care today- >50% coordination of care- due to  Review of labs, calling dietitian and SLP about Cortrak and seeing if can remove- will d/w team if can keep d/c date, or have to push out  Addendum- upgraded to D2- also, only eating ~50% of meals- refusing supplements per dietitian, so either needs to take supplements OR get PEG.     LOS: 12 days A FACE TO FACE EVALUATION WAS PERFORMED  Sharnell Knight 05/10/2023, 8:56 AM

## 2023-05-10 NOTE — Progress Notes (Signed)
Speech Language Pathology Daily Session Note  Patient Details  Name: Timothy [name] MRN: [id number] Date of Birth: [birth year]  Today's Date: 05/10/2023 SLP Individual Time: 0802-0845 SLP Individual Time Calculation (min): 43 min  Short Term Goals: Week 2: SLP Short Term Goal 1 (Week 2): STGs=LTGs d/t ELOS  Skilled Therapeutic Interventions: SLP conducted skilled therapy session targeting dysphagia management and communication goals. Upon SLP entry, patient had just finished breakfast with no reports of difficulty. SLP provided patient with regular solids to practice chewing/clearance of right pocketing with patient exhibiting improving clearance of the oral cavity. Total oral residue has reduced after each bite, and patient is clearing with lingual sweep when cued more efficiently. Patient continues to tolerate thin liquid (water) via Provale cup, however given silent aspiration on previous swallow study, SLP will keep patient on NTL overall until repeated swallow study can be rendered. Recommend diet upgrade to Dys2/NTL with thin WATER ONLY via Provale cup when staff is present. SLP and patient discussed repetition of swallow study prior to discharge with tentative plans for Wednesday, 05/12/23. During communication tasks, patient disoriented to date but oriented to location and broad situation. Patient answered basic biographical yes/no questions with 80% accuracy with supervisionA. Patient followed single step commands with 100% accuracy given min visual cues. Patient read 67% of visually presented word and sentence level writing given modA and wrote his name and the month with totalA overall. Patient was left in lowered bed with call bell in reach and bed alarm set. SLP will continue to target goals per plan of care.       Pain Pain Assessment Pain Scale: 0-10 Pain Score: 0-No pain  Therapy/Group: Individual Therapy  [name], M.A., CCC-SLP  [name] 05/10/2023, 8:39 AM

## 2023-05-10 NOTE — Progress Notes (Signed)
Physical Therapy Session Note  Patient Details  Name: Timothy [name] MRN: [id number] Date of Birth: [birth year]  Today's Date: 05/10/2023 PT Individual Time: 1019-1130, [phone]  PT Individual Time Calculation (min): 71 min , 27 min   Short Term Goals: Week 2:  PT Short Term Goal 1 (Week 2): STG's=LTG's due to ELOS  Skilled Therapeutic Interventions/Progress Updates:      Therapy Documentation Precautions:  Precautions Precautions: Fall Precaution Comments: R inattention; SBP <180, cortrak Restrictions Weight Bearing Restrictions: No   Treatment Session 1:   Pt agreeable to PT with emphasis on fine motor and global strength training to improve right hemi-body awareness. Pt supervision with supine>sit>ambulation no AD to main gym with improved safety awareness.   Pt supervision for dynamic standing balancing while performing contralateral reaching to retrieve can do pinch pins (red and green resistance) and place on basket ball goal netting. Pt able to remove pinch pins with same assist level. Pt performed above activity on compliant surface airex foam pad with supervision.   Pt transitioned to q-ped and and performed hip and knee extension 2 x 8 bilaterally with tactile cueing at hips for truncal stabilization.    Pt requires verbal and visual cues for hook lying bridges 2 x 8 with ball to facilitate increased adductor activation.   Pt ambulated to room and left semi-reclined in bed with all needs in reach and alarm on.    Treatment Session 2:   Pt agreeable to PT and declines pain. Pt supervision with all mobility in session including bed mobiliity, transfers, gait >200 ft without AD.   Patient demonstrates increased fall risk as noted by score of   49/56 on Berg Balance Scale.  (<36= high risk for falls, close to 100%; 37-45 significant >80%; 46-51 moderate >50%; 52-55 lower >25%). Pt demonstrates clinically significant difference in score from original assessment as pt  previous score 41/56.     05/10/23 1423  Balance  Balance Assessed Yes  Standardized Balance Assessment  Standardized Balance Assessment Berg Balance Test  Berg Balance Test  Sit to Stand 4  Standing Unsupported 4  Sitting with Back Unsupported but Feet Supported on Floor or Stool 4  Stand to Sit 4  Transfers 4  Standing Unsupported with Eyes Closed 4  Standing Ubsupported with Feet Together 4  From Standing, Reach Forward with Outstretched Arm 3  From Standing Position, Pick up Object from Floor 4  From Standing Position, Turn to Look Behind Over each Shoulder 3  Turn 360 Degrees 3  Standing Unsupported, Alternately Place Feet on Step/Stool 4  Standing Unsupported, One Foot in Front 1  Standing on One Leg 3  Total Score 49   Pt ambulated to room from gym and left semi-reclined in bed with all needs in reach and alarm on.   Therapy/Group: Individual Therapy  [name] [name] PT, DPT  05/10/2023, 7:50 AM

## 2023-05-11 ENCOUNTER — Other Ambulatory Visit (HOSPITAL_COMMUNITY): Payer: Self-pay

## 2023-05-11 LAB — BASIC METABOLIC PANEL
Anion gap: 8 (ref 5–15)
BUN: 34 mg/dL — ABNORMAL HIGH (ref 8–23)
CO2: 28 mmol/L (ref 22–32)
Calcium: 9.1 mg/dL (ref 8.9–10.3)
Chloride: 100 mmol/L (ref 98–111)
Creatinine, Ser: 1.52 mg/dL — ABNORMAL HIGH (ref 0.61–1.24)
GFR, Estimated: 49 mL/min — ABNORMAL LOW (ref >60)
Glucose, Bld: 142 mg/dL — ABNORMAL HIGH (ref 70–99)
Potassium: 4.8 mmol/L (ref 3.5–5.1)
Sodium: 136 mmol/L (ref 135–145)

## 2023-05-11 LAB — GLUCOSE, CAPILLARY
Glucose-Capillary: 171 mg/dL — ABNORMAL HIGH (ref 70–99)
Glucose-Capillary: 104 mg/dL — ABNORMAL HIGH (ref 70–99)
Glucose-Capillary: 98 mg/dL (ref 70–99)
Glucose-Capillary: 119 mg/dL — ABNORMAL HIGH (ref 70–99)

## 2023-05-11 NOTE — Progress Notes (Signed)
PROGRESS NOTE   Subjective/Complaints:   Per dietitian, pt eating 40-65%  D2 diet upgrade yesterday-but back to D1 this AM Pt hasn't been taking supplements- thought they were milk, or milk products and milk doesn't work with him.   LBM yesterday.  Still having inconitnence    ROS: limited by aphasia- wife at bedside  Objective:   No results found. Recent Labs    05/10/23 0612  WBC 5.2  HGB 14.0  HCT 42.2  PLT 142*     Recent Labs    05/10/23 0612 05/11/23 0520  NA 134* 136  K 4.4 4.8  CL 98 100  CO2 25 28  GLUCOSE 144* 142*  BUN 34* 34*  CREATININE 1.42* 1.52*  CALCIUM 9.0 9.1    Intake/Output Summary (Last 24 hours) at 05/11/2023 0917 Last data filed at 05/11/2023 0700 Gross per 24 hour  Intake 490 ml  Output 750 ml  Net -260 ml         Physical Exam: Vital Signs Blood pressure (!) 143/78, pulse 62, temperature 97.8 F (36.6 C), resp. rate 17, height 5\' 7"  (1.702 m), weight 71.8 kg, SpO2 99%.      General: awake, alert, appropriate, slightly sitting ; wife at bedside; NAD HENT: conjugate gaze; oropharynx moist- cortrak in place CV: rate controlled, bradycardic rate; no JVD Pulmonary: CTA B/L; no W/R/R- good air movement GI: soft, NT, ND, (+)BS Psychiatric: appropriate; flat Neurological: aphasic- speaks in word level Ext: no clubbing, cyanosis, or edema Psych: flat but cooperative  Neuro: Oriented to person place but not time. Language more fluid and spontaneous. Speech clear. RUE: 4-/5 Deltoid, 4-/5 Biceps, 4-/5 Triceps, 4-/5 Grip--no significant change LUE: 5/5 Deltoid, 5/5 Biceps, 5/5 Triceps,  5/5 Grip RLE: HF 4/5, KE 4+/5, ADF 4+/5, APF 4+/5 LLE: HF 5/5, KE 5/5, ADF 5/5, APF 5/5 Incision intact light touch in all 4 extremities. Neurological exam stable today 05/11/2023 Musculoskeletal: no pain with PROM. 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 Functional deficits which require 3+ hours per day of interdisciplinary therapy in a comprehensive inpatient rehab setting. Physiatrist is providing close team supervision and 24 hour management of active medical problems listed below. Physiatrist and rehab team continue to assess barriers to discharge/monitor patient progress toward functional and medical goals  Care Tool:  Bathing    Body parts bathed by patient: Right arm, Left arm, Chest, Abdomen, Front perineal area, Right upper leg, Left upper leg, Face   Body parts bathed by helper: Left lower leg, Right lower leg, Buttocks     Bathing assist Assist Level: Moderate Assistance - Patient 50 - 74%     Upper Body Dressing/Undressing Upper body dressing   What is the patient wearing?: Pull over shirt    Upper body assist Assist Level: Moderate Assistance - Patient 50 - 74%    Lower Body Dressing/Undressing Lower body dressing      What is the patient wearing?: Pants, Incontinence brief     Lower body assist Assist for lower body dressing: Moderate Assistance - Patient 50 - 74%     Toileting Toileting    Toileting assist Assist  for toileting: Maximal Assistance - Patient 25 - 49%     Transfers Chair/bed transfer  Transfers assist     Chair/bed transfer assist level: Contact Guard/Touching assist     Locomotion Ambulation   Ambulation assist      Assist level: Contact Guard/Touching assist Assistive device: No Device Max distance: 150'   Walk 10 feet activity   Assist     Assist level: Contact Guard/Touching assist Assistive device: No Device   Walk 50 feet activity   Assist    Assist level: Contact Guard/Touching assist Assistive device: No Device    Walk 150 feet activity   Assist    Assist level: Contact Guard/Touching assist Assistive device: No Device    Walk 10 feet on uneven surface  activity   Assist     Assist level: Minimal  Assistance - Patient > 75%     Wheelchair     Assist Is the patient using a wheelchair?: Yes Type of Wheelchair: Manual    Wheelchair assist level: Dependent - Patient 0% Max wheelchair distance: 150'    Wheelchair 50 feet with 2 turns activity    Assist        Assist Level: Dependent - Patient 0%   Wheelchair 150 feet activity     Assist      Assist Level: Dependent - Patient 0%   Blood pressure (!) 143/78, pulse 62, temperature 97.8 F (36.6 C), resp. rate 17, height 5\' 7"  (1.702 m), weight 71.8 kg, SpO2 99%.  Medical Problem List and Plan: 1. 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  Etiology suspected to be A-fib not on anticoagulation versus large vessel disease             -patient may shower             -ELOS/Goals: 10 to 14 days, supervision to min assist with PT, OT, SLP          D/c date set for 05/13/23  Con't CIR PT, OT and SLP Contacted dietitian about Cortrak- and diet- will see if can get cortrak out  2.  Antithrombotics: -DVT/anticoagulation:  Pharmaceutical: Heparin initiated for anticoagulation. PLAN TO TRANSITION TO ELIQUIS 5 MG BID ONCE ABLE TO SWALLOW. 9/28 still NPO 10/1- per pharmacy change change to Eliquis- stopped ASA and Heparin and added Eliquis - but then realized wasn't clear- called pharmacy again- will reach out to Dr Pearlean Brownie and see if need ASA at all? 10/2- ASA stopped per Dr Pearlean Brownie             -antiplatelet therapy: Aspirin 325 mg daily 3. Pain Management: tylenol as needed  9/30- denies pain- con't regimen 4. Mood/Behavior/Sleep: Provide emotional support             -antipsychotic agents: N/A 5. Neuropsych/cognition: This patient is not capable of making decisions on his own behalf. 6. Skin/Wound Care: Routine skin checks 7. Fluids/Electrolytes/Nutrition: Routine and analysis with follow-up chemistries 8.  Dysphagia.  Currently NPO, Cortrak. Speech  therapy follow-up.  9/27- pulled cortrak accidentally last night- out a few inches- per nursing- KUB looks OK- reviewed it- can use- mittens? Recheck modified barium swallow on Tuesday, October 1  10/1- pending MBSS  10/2- now on D3 nectar thick diet  10/5- Diet remains D1 with nectar thick. Continue TF also  10/6 pt ate 25-50-65% yesterday, will reduce TF to 35cc/hr  10/8- will look into PEG 9.  Hypertension.  Imdur  60 mg daily.  Monitor with increased mobility.  Long-term blood pressure goal normotensive  9/26- will change Imdur to immediate release- d/w pharmacy- changed  10/6 -10/7bp controlled Vitals:   05/10/23 1931 05/11/23 0632  BP: 115/75 (!) 143/78  Pulse: 62   Resp: 17 17  Temp: 98.6 F (37 C) 97.8 F (36.6 C)  SpO2: 98% 99%    10.  Bradycardia into the 40s.  Coreg held.  Avoid negative chronotropic medications.   Monitor with increased mobility  9/30-10/1- doing slightly better- in 50s- not 40's  10/2-10/5 HR in 50s-60's - con't regimen 11.  Hyperlipidemia.  Pravachol 12.  CKD stage IIIa.  Follow-up chemistries  9/30- Cr 1.48- up from 1.30- BUN up to 29- will increase Free water to 250 cc q4 - don't want to increase further due to [facility]-- and see how goes in labs tomorrow-   10/1- BUN up to 34 and Cr 1.54 up from 1.48- will consult nutritionist  for free water changes-   10/2- spoke with nutritonist- will change TF's to night time only- maintain fluids, but decrease protein- also adding supplements if doesn't eat enough- per pt/wife eating really well- will check BMP in AM  10/3- Pt's food downgraded to D1 diet- also BUN up to 42 up from 34 and Cr stable at 1.55- will give low dose IVFs to try and help turn this Around- will do 75cc for 12 hours 5pm to 5am- and recheck in AM with BMP  10/4- pending labs this AM- pt doesn't have labs drawn yet- pending  10/5 improved BUN/Cr yesterday--will increase free water thru tube today   10/6 pt ate better yesterday   -cut nocturnal  TF in half   -continue increased H20 flushes   -labs tomorrow  10/7- BUN/Cr stable- however on water flushes as well- explained to pt will need to take double the amount of Nectar thick fluids, if remove Cortrak. Will d/w SLP and dietitian  10/8- still getting 300cc free water and I think pt needs PEG_ esp since they downgraded diet to D1 nectar thick again this AM- will d/w pt    Latest Ref Rng & Units 05/11/2023    5:20 AM 05/10/2023    6:12 AM 05/07/2023    8:03 AM  BMP  Glucose 70 - 99 mg/dL 161  096  045   BUN 8 - 23 mg/dL 34  34  33   Creatinine 0.61 - 1.24 mg/dL 4.09  8.11  9.14   Sodium 135 - 145 mmol/L 136  134  134   Potassium 3.5 - 5.1 mmol/L 4.8  4.4  4.5   Chloride 98 - 111 mmol/L 100  98  100   CO2 22 - 32 mmol/L 28  25  26    Calcium 8.9 - 10.3 mg/dL 9.1  9.0  9.2     13.  Diastolic congestive heart failure.  Monitor for any signs of fluid overload. 9/28- stable   9/30- weight stable- 10/1- no weight today- will d/w nursing   10/2- weight up slightly- will monitor for trend  10/3- 71.3 kg today- up 1 lb from yesterday-if goes above 73 kg, will give Lasix  10/5- Weight 71.2 kg this AM- stable  10/7- Weight stable 71.8kg Filed Weights   05/08/23 0702 05/09/23 0708 05/10/23 0539  Weight: 71.2 kg 71.5 kg 71.8 kg    14.  History of tobacco/alcohol use/Cocaine.  Provide counseling 15.  Medical noncompliance.  Counseling 16.  Atrial fibrillation.  Hold Coreg due to bradycardia.  Continue ASA and heparin until able to swallow and can switch to Eliquis  10/1- changed to Eliquis per Pharmacy- can do- can be crushed- stopped ASA and heparin   17. Constipation  9/26- will give sorbitol after therapy today  9/27- no results with sorbitol- will give another Sorbitol but 60cc and then soap suds enema if no results  10/1- cannot tell when having BM- has no control per wife- last went yesterday  10/4- LBM yesterday, but still has no control- we discussed doing suppository/bowel  program- pt refused for now , but said he'd "think about it".   10/6- no bm since 10/3--add miralax and senokot-s at HS.   10/8- LBM yesterday- still having incontinence-  18. Hyperkalemia  9/26- will recheck in AM- is 5.3- however was 4.1- so hesitant to give Lokelma without rechecking 9/27 - K+ 4.2- so likely hemolysis yesterday  10/6 k+ 4.5 10/4 19. Diabetes- on Tfs  9/26- will add SSI, since A1c 6.9 and on Tf's- do q4 hours per protocol.   9/30-10/1 CBGs running well- might need to change if gets diet with MBSS today  10/2- change to TID with meals- and monitor  10/3-10/5 CBGs reasonable control--no changes  10/7-10/8 great control-   CBG (last 3)  Recent Labs    05/10/23 1653 05/10/23 2316 05/11/23 0630  GLUCAP 95 115* 171*     I spent a total of 58   minutes on total care today- >50% coordination of care- due to  D/w dietitians 3 different times- review of SLP notes; and downgrading of diet to D1- also discussion with pt x2 about needing PEG- went back and discussed PEG and prolonged stay to pt and wife.   LOS: 13 days A FACE TO FACE EVALUATION WAS PERFORMED  Donnie Gedeon 05/11/2023, 9:17 AM

## 2023-05-11 NOTE — Progress Notes (Signed)
Physical Therapy Session Note  Patient Details  Name: Timothy [name] MRN: [id number] Date of Birth: [birth year]  Today's Date: 05/11/2023 PT Individual Time: 0900-1000 PT Individual Time Calculation (min): 60 min   Short Term Goals: Week 2:  PT Short Term Goal 1 (Week 2): STG's=LTG's due to ELOS  Skilled Therapeutic Interventions/Progress Updates:    pt received in bed and agreeable to therapy. No complaint of pain. Bed mobility with supervision.   Sit to stand with CGA throughout session. ambulatory transfer to bathroom for bladder void with min for balance with 3/3 toileting tasks. Found brief to be soiled, replaced with clean with tot A for time.   Pt ambulated throughout session with CGA with no true LOB. Session focused on dynamic gait with RUE component. Pt first retrieved squigz hidden around room, but reported this task was fairly easy. Upgrade to obstacle course including cobble foam (min-mod with intermittent HHA), stepping over bolster (CGA-min), airex beam curb walking (CGA), and walking around bowling pins (CGA), and retrieving squig off window before walking back to box. Pt repeated task and was walking/active >10 min. Pt also picked up all items from floor (min A)  and put away with instructional cueing at times, but no LOB. Pt returned to room and to bed, was left with all needs in reach and alarm active.   Therapy Documentation Precautions:  Precautions Precautions: Fall Precaution Comments: R inattention; SBP <180, cortrak Restrictions Weight Bearing Restrictions: No General:       Therapy/Group: Individual Therapy  Juluis Rainier 05/11/2023, 4:39 PM

## 2023-05-11 NOTE — Progress Notes (Signed)
Speech Language Pathology Daily Session Note  Patient Details  Name: Timothy [name] MRN: [id number] Date of Birth: 04-Nov-1955  Today's Date: 05/11/2023 SLP Individual Time: 0800-0858 SLP Individual Time Calculation (min): 58 min  Short Term Goals: Week 2: SLP Short Term Goal 1 (Week 2): STGs=LTGs d/t ELOS  Skilled Therapeutic Interventions: SLP conducted skilled therapy session targeting dysphagia management, cognitive, and communication goals. Per NT report, patient exhibited fast rate, pocketing, and hesitance to clear pocketed residue until asked. NT further reports coughing throughout meal and overall need for max cuing throughout. SLP will return patient's diet to Dys1/NTL with water from 5cc Provale cup. Educated patient and wife on diet recommendations for discharge and means to prepare pureed items. SLP will provide further education re: thickened liquids at next session. During communication tasks, patient 85% accurate with basic environmental and knowledge-based yes/no questions, benefiting from min-mod verbal and visual cues. During following commands task, patient required modA to follow single-step directions using objects (cues for object identification) and supervision-minA for basic body movement commands. During task, patient sustained attention for 30+ minutes given supervisionA and attended to the right field with supervisionA. SLP presented patient with basic communication board with patient identifying basic wants/needs via board pictures given min-modA. Patient reports that this board would be helpful for day to day interactions with nursing staff. SLP provided board to patient for use. Patient was left in lowered bed with call bell in reach and bed alarm set. SLP will continue to target goals per plan of care.      Pain Pain Assessment Pain Scale: 0-10 Pain Score: 0-No pain  Therapy/Group: Individual Therapy  [name], M.A., CCC-SLP  [name] 05/11/2023, 9:06  AM

## 2023-05-11 NOTE — Progress Notes (Signed)
Occupational Therapy Session Note  Patient Details  Name: Timothy [name] MRN: [id number] Date of Birth: 1956-06-22  Today's Date: 05/11/2023 OT Individual Time: 1345-1445 OT Individual Time Calculation (min): 60 min    Short Term Goals: Week 2:  OT Short Term Goal 1 (Week 2): STG=LTG d/t ELOS  Skilled Therapeutic Interventions/Progress Updates:      Therapy Documentation Precautions:  Precautions Precautions: Fall Precaution Comments: R inattention; SBP <180, cortrak Restrictions Weight Bearing Restrictions: No General: "Just hanging here" Pt supine in bed upon OT arrival, agreeable to OT session.  Pain: no pain reported   ADL:  Functional mobility: SBA without AD ambulating from room><therapy gym  Exercises: Pt completed 10 minutes of nu step bike in order to increase BUE/BLEstrength and endurance in preparation for increased independence in ADLs such as bathing. No Rest break on level 6 resistance. Emphasis on maintaining grip with RUE on handle, lost grip 4 times with Min A to place hand back on handle.   Other Treatments:  Pt participated in [facility] activity with pieces of paper, pt retrieving pieces of paper  with RUE and LUE and placing them into letter order to form simple words suxh as his name and therapist name for increased dexterity and coordination in order to complete ADLs such as buttoning shirts. Pt completed tasks with PRN rest breaks d/t increased fatigue. Pt then completed snanning/FMC activity retrieving sticky notes from 1-10 and placing them in order with RUE, standing at wall retrieving with RUE from various heights, required occasional Lt hand support to retrieve and place sticky note on wall. Pt still presenting with difficulty with motor planning for purposeful grasp/release.   Pt supine in bed with bed alarm activated, 2 bed rails up, call light within reach and 4Ps assessed.   Therapy/Group: Individual Therapy  Velia Meyer, OTD, OTR/L 05/11/2023,  2:35 PM

## 2023-05-11 NOTE — Patient Care Conference (Signed)
Inpatient RehabilitationTeam Conference and Plan of Care Update Date: 05/11/2023   Time: 1141am    Patient Name: Timothy [name]      Medical Record Number: [id number]  Date of Birth: [birth year] Sex: Male         Room/Bed: 4W03C/4W03C-01 Payor Info: Payor: [employer] MEDICARE / Plan: [employer] MEDICARE CHOICE PPO / Product Type: *No Product type* /    Admit Date/Time:  04/28/2023  3:46 PM  Primary Diagnosis:  Left middle cerebral artery stroke [facility])  Hospital Problems: Principal Problem:   Left middle cerebral artery stroke (HCC) Active Problems:   Hx of atrial flutter   Chronic systolic heart failure (HCC)   Chronic kidney disease, stage 3a (HCC)   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 Genice Rouge Social Worker Present: Dossie Der, LCSW Nurse Present: Other (comment) Konrad Dolores, RN) PT Present: [name], PT OT Present: Velia Meyer, OT SLP Present: Feliberto Gottron, SLP PPS Coordinator present : Edson Snowball, PT     Current Status/Progress Goal Weekly Team Focus  Bowel/Bladder      Incontinent with urgency    Continent of bowel and bladder    Assess need for bowel medication   Swallow/Nutrition/ Hydration   Currentl consuming Dys1/NTL diet with water via Provale cup between meals with staff present   continued use of safe swallowing strategies, diet upgrade  complete repeated MBS prior to discharge to assess for appropriate diet upgrade options, implement safe swallowing strategies    ADL's   Bed mobility: SBA supine><sit  Toilet transfer: SBA  Toileting: SBA  UB dressing: SBA  LB dressing: SBA  LB dressingFootwear: SBA doffing/donning socks  Shower transfer: SBA with no AD  Bathing: SBA   SBA  overall   continue RUE FMC/coordination and awareness    Mobility   supervision all mobility, ambulation >300 ft no AD and steps x 12, 6" without HR's   supervision  fine motor skills and attention to right side with functional mobility    Communication   supervision-minA for basic yes/no questions depending on topic, minA for word finding with basic environmental objects, modA for auditory comprehension, supervision-minA for basic one-step functional environmental commands   minA   increase yes/no accuracy/auditory comprehension of mildly complex language, command following, naming    Safety/Cognition/ Behavioral Observations  disoriented to date, attention improving   Supervision   sustained attention to structured and informal tasks    Pain      N/a          Skin      N/a           Discharge Planning:  Wife stays here and see's husband in therapies his main barrier is his swallowing and speech issues. At a supervision-SBA level fot mobility. Home health in place awaiting equipment recommendations   Team Discussion: Patient post left MCA CVA with dysphagia. Currently cannot follow strategies and limited by balance deficits and fine motor issues; right hand coordination.Still requires hydration via cortrak; on dysphagia 1 / nectar thick diet. Needs mod assist for comprehension and min assist for word finding difficulties.   Patient on target to meet rehab goals: yes, currently needs supervision overall; able to ambulate 300 ft without an assistive device.  *See Care Plan and progress notes for  long and short-term goals.   Revisions to Treatment Plan:  MBS 05/13/23 Peg placement pending   Teaching Needs: Safety, medications, dietary modifications, peg care, toileting, etc.   Current Barriers to Discharge: Nutritional means  Possible Resolutions to Barriers: Family education DME: TTB     Medical Summary Current Status: bladder urgency- having bowel  inconintence-having no improvement so far;  Barriers to Discharge: Medical stability;Inadequate Nutritional Intake;Self-care education;Behavior/Mood;Incontinence;Other (comments);Neurogenic Bowel & Bladder;Weight bearing restrictions  Barriers to Discharge Comments: swallowing ismain limiter- needs PEG- will hold Eliquis- for PEG- limtied by DM- BG's pretty well controlled- cognition and aphasia/lagnuage/swallowing-is main issue Possible Resolutions to [employer] Focus: try MBSS on Thursday to see if can avoid PEG;  more balance and fine motor issues; needs tubtransferbench- Down to D1 nectar thick- Proval cup- significant aphasia- SLP biggestl imiter-d/c- 10/16   Continued Need for Acute Rehabilitation Level of Care: The patient requires daily medical management by a physician with specialized training in physical medicine and rehabilitation for the following reasons: Direction of a multidisciplinary physical rehabilitation program to maximize functional independence : Yes Medical management of patient stability for increased activity during participation in an intensive rehabilitation regime.: Yes Analysis of laboratory values and/or radiology reports with any subsequent need for medication adjustment and/or medical intervention. : Yes   I attest that I was present, lead the team conference, and concur with the assessment and plan of the team.   Gwenyth Allegra 05/11/2023, 4:43 PM

## 2023-05-11 NOTE — Plan of Care (Signed)
  Problem: RH BOWEL ELIMINATION Goal: RH STG MANAGE BOWEL WITH ASSISTANCE Description: STG Manage Bowel with min Assistance. Outcome: Progressing   Problem: RH BLADDER ELIMINATION Goal: RH STG MANAGE BLADDER WITH ASSISTANCE Description: STG Manage Bladder With min Assistance Outcome: Progressing   Problem: RH SKIN INTEGRITY Goal: RH STG SKIN FREE OF INFECTION/BREAKDOWN Description: Skin will remain intact and free of breakdown with min assist  Outcome: Progressing Goal: RH STG MAINTAIN SKIN INTEGRITY WITH ASSISTANCE Description: STG Maintain Skin Integrity With min Assistance. Outcome: Progressing   Problem: RH SAFETY Goal: RH STG ADHERE TO SAFETY PRECAUTIONS W/ASSISTANCE/DEVICE Description: STG Adhere to Safety Precautions With min Assistance/Device. Outcome: Progressing Goal: RH STG DECREASED RISK OF FALL WITH ASSISTANCE Description: STG Decreased Risk of Fall With min Assistance. Outcome: Progressing   Problem: RH COGNITION-NURSING Goal: RH STG USES MEMORY AIDS/STRATEGIES W/ASSIST TO PROBLEM SOLVE Description: STG Uses Memory Aids/Strategies With min Assistance to Problem Solve. Outcome: Progressing   Problem: RH PAIN MANAGEMENT Goal: RH STG PAIN MANAGED AT OR BELOW PT'S PAIN GOAL Description: Less than 4 with PRN medications min assist  Outcome: Progressing

## 2023-05-11 NOTE — TOC Benefit Eligibility Note (Signed)
Patient Product/process development scientist completed.    The patient is insured through [employer]. Patient has Medicare and is not eligible for a copay card, but may be able to apply for patient assistance, if available.    Ran test claim for Eliquis 5 mg and the current 30 day co-pay is $11.20.   This test claim was processed through [facility]- copay amounts may vary at other pharmacies due to pharmacy/plan contracts, or as the patient moves through the different stages of their insurance plan.     Roland Earl, CPHT Pharmacy Technician III Certified Patient Advocate [facility] Patient Advocate Team Direct Number: [phone]  Fax: [phone]

## 2023-05-11 NOTE — Progress Notes (Signed)
Patient ID: Timothy [name], male   DOB: Nov 05, 1955, 67 y.o.   MRN: [id number]  Met with pt and wife who is present to discuss team conference progress this week. Pt really does not want a PEG aware will try another MBS on Thursday to see if sallow has improved prior to consulting for a PEG due to not taking enough in for fluid-hydration. Pt feels he is eating enough made aware he is not drinking enough nectar thick liquids. Both aware discharge is delayed due to this issue and pt is not happy about this. Have let home health agency know of delay and will keep updated. Pt needs a tub bench will order since his medicaid covers. Will await swallow evaluation and work on discharge needs.

## 2023-05-11 NOTE — Progress Notes (Signed)
Nutrition Follow-up  DOCUMENTATION CODES:   Severe malnutrition in context of acute illness/injury  INTERVENTION:  Osmolite 1.5@70ml /hr x 14 hrs (from 1800-0800)   ProSource TF 20- Give 60ml daily via tube, each supplement provides 80kcal and 20g of protein.    Free water flushes q4 hours per MD   Regimen provides 1550kcal/day (meets 70% of estimated needs), 82g/day protein (meets 75% of estimated needs) and 2284ml/day of free water.    Nepro Shake po BID, each supplement provides 425 kcal and 19 grams protein   Magic cup TID with meals, each supplement provides 290 kcal and 9 grams of protein   MVI po daily    NUTRITION DIAGNOSIS:   Severe Malnutrition related to acute illness as evidenced by moderate fat depletion, moderate muscle depletion, severe muscle depletion, percent weight loss (hernia repair in august with poor intake post op followed by admission for stroke).     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 MD reached out this a.m. about pt and supplements. Stated she had concern about his oral intake. Had hoped to discontinue cortrak with plans to go home this week.  She has spoken to pt about PEG placement. Pending MBS which is scheduled for Thursday. Pt and wife in room at time visit. Pt stated he was not drinking supplement duet to he thought it was milk and he did not like milk. But now is drinking. Patient's diet texture changing from up grade to down grade. Pt  and wife both confirmed because he is trying to eat to fast due to excitement about eating. RD discussed PEG and answered any questions they had.  MBS-10/01 10/1; Diet DYS3 NTL 10/2- Diet DYS1 NTL 10/07 DYS2 NTL 10/08- DYS1 NTL Weight History: 05/11/23 71 kg  04/28/23 76.2 kg  03/19/23 68.9 kg  03/17/23 68.9 kg  03/16/23 68.9 kg  03/14/23 77.1 kg  10/28/22 76 kg    Meds and Labs reviewed   NUTRITION - FOCUSED PHYSICAL EXAM:  Flowsheet Row Most Recent Value  Orbital Region Moderate depletion  Upper Arm Region Moderate depletion  Thoracic and Lumbar Region Moderate depletion  Buccal Region Moderate depletion  Temple Region Severe depletion  Clavicle Bone Region Moderate depletion  Clavicle and Acromion Bone Region Moderate depletion  Scapular Bone Region Moderate depletion  Dorsal Hand Mild depletion  Patellar Region Severe depletion  Anterior Thigh Region Severe depletion  Posterior Calf Region Moderate depletion  Edema (RD Assessment) None       Diet Order:   Diet Order             DIET - DYS 1 Fluid consistency: Nectar Thick  Diet effective now                   EDUCATION NEEDS:   Education needs have been addressed  Skin:  Skin Assessment: Reviewed RN Assessment  Last BM:  10/6  Height:   Ht Readings from Last 1 Encounters:  04/28/23 5\' 7"  (  1.702 m)    Weight:   Wt Readings from Last 1 Encounters:  05/11/23 71 kg    Ideal Body Weight:     BMI:  Body mass index is 24.52 kg/m.  Estimated Nutritional Needs:   Kcal:  1900-2200 kcal  Protein:  95-110 g  Fluid:  1700-24ml    Jamelle Haring RDN, LDN Clinical Dietitian  RDN pager # available on Amion

## 2023-05-11 NOTE — Progress Notes (Signed)
Occupational Therapy Session Note  Patient Details  Name: Timothy [name] MRN: [id number] Date of Birth: 1956/06/17  Today's Date: 05/11/2023 OT Individual Time: 1030-1055 OT Individual Time Calculation (min): 25 min    Short Term Goals: Week 2:  OT Short Term Goal 1 (Week 2): STG=LTG d/t ELOS  Skilled Therapeutic Interventions/Progress Updates:    Pt resting in bed upon arrival. Wife present. OT intervention with focus on RUE grasp/release and shoulder AROM. Pt issued small ball for grasp/release activities. Pt remained in bed with all needs wihtin reach. Bed alarm activated.  Therapy Documentation Precautions:  Precautions Precautions: Fall Precaution Comments: R inattention; SBP <180, cortrak Restrictions Weight Bearing Restrictions: No   Pain: Pt denies pain this morning   Therapy/Group: Individual Therapy  Rich Brave 05/11/2023, 12:04 PM

## 2023-05-12 LAB — GLUCOSE, CAPILLARY
Glucose-Capillary: 154 mg/dL — ABNORMAL HIGH (ref 70–99)
Glucose-Capillary: 73 mg/dL (ref 70–99)
Glucose-Capillary: 108 mg/dL — ABNORMAL HIGH (ref 70–99)

## 2023-05-12 NOTE — Progress Notes (Signed)
Speech Language Pathology Daily Session Note  Patient Details  Name: Timothy [name] MRN: [id number] Date of Birth: Jun 21, 1956  Today's Date: 05/12/2023 SLP Individual Time: 0803-0900 SLP Individual Time Calculation (min): 57 min  Short Term Goals: Week 2: SLP Short Term Goal 1 (Week 2): STGs=LTGs d/t ELOS  Skilled Therapeutic Interventions: SLP conducted skilled therapy session targeting dysphagia management and communication goals. SLP assessed tolerance of current Dys1/NTL diet with patient requiring mod to max verbal cues for slow rate. Patient lightly argumentative with SLP re: rate, stating he is going slow already despite shoveling behavior. With fast rate, patient exhibits intermittent cough. When cued to slow rate, cough ceases. No right pocketing observed. Recommend continuation of current diet with full supervision from staff to cue for slow rate. SLP instructed patient to swallow twice after each bite to assist with rate self-regulation. Per recent team conference with MD, medical team is concerned re: overall fluid intake and would like a repeated MBS to assess for other safe liquid options (I.e. thin liquids from 10 cc provale cup). SLP will plan for MBS 05/13/23 to assess for safe swallowing strategies to promote increased fluid intake. During communication tasks, patient identified line drawn items via pointing with 80% accuracy with supervision and with100% accuracy given minA. During naming task, patient named line drawn items with 80% accuracy given modA, intermittently needing field of 2 presented options. Patient was left in lowered bed with call bell in reach and bed alarm set. SLP will continue to target goals per plan of care.      Pain Pain Assessment Pain Scale: 0-10 Pain Score: 0-No pain  Therapy/Group: Individual Therapy  [name], M.A., CCC-SLP  [name] 05/12/2023, 8:23 AM

## 2023-05-12 NOTE — Progress Notes (Signed)
PROGRESS NOTE   Subjective/Complaints:   Pt reports he's "Fine" but upset has to stay here past 10/10  Wife said he hasn't received any supplements.   He and wife still recognizes  needs PEG>   LBM yesterday.  Still having inconitnence    ROS: limited by aphasia- wife at bedside  Objective:   No results found. Recent Labs    05/10/23 0612  WBC 5.2  HGB 14.0  HCT 42.2  PLT 142*     Recent Labs    05/10/23 0612 05/11/23 0520  NA 134* 136  K 4.4 4.8  CL 98 100  CO2 25 28  GLUCOSE 144* 142*  BUN 34* 34*  CREATININE 1.42* 1.52*  CALCIUM 9.0 9.1    Intake/Output Summary (Last 24 hours) at 05/12/2023 1045 Last data filed at 05/12/2023 0752 Gross per 24 hour  Intake 570 ml  Output 1575 ml  Net -1005 ml         Physical Exam: Vital Signs Blood pressure (!) 159/84, pulse (!) 55, temperature 97.9 F (36.6 C), resp. rate 18, height 5\' 7"  (1.702 m), weight 71.7 kg, SpO2 100%.       General: awake, alert, appropriate, supine in bed- getting TF'sNAD HENT: conjugate gaze; oropharynx moist- cortrak CV: regular rate; no JVD Pulmonary: CTA B/L; no W/R/R- good air movement GI: soft, NT, ND, (+)BS Psychiatric: upset about new d/c date Neurological: moderate cognitive issues- since here-  Neurological: aphasic- speaks in word level Ext: no clubbing, cyanosis, or edema Psych: flat but cooperative  Neuro: Oriented to person place but not time. Language more fluid and spontaneous. Speech clear. RUE: 4-/5 Deltoid, 4-/5 Biceps, 4-/5 Triceps, 4-/5 Grip--no significant change LUE: 5/5 Deltoid, 5/5 Biceps, 5/5 Triceps,  5/5 Grip RLE: HF 4/5, KE 4+/5, ADF 4+/5, APF 4+/5 LLE: HF 5/5, KE 5/5, ADF 5/5, APF 5/5 Incision intact light touch in all 4 extremities. Neurological exam stable today 05/12/2023 Musculoskeletal: no pain with PROM. Right upper extremity fifth digit contracture-wife reports this is chronic    Assessment/Plan: 1. Functional deficits which require 3+ hours per day of interdisciplinary therapy in a comprehensive inpatient rehab setting. Physiatrist is providing close team supervision and 24 hour management of active medical problems listed below. Physiatrist and rehab team continue to assess barriers to discharge/monitor patient progress toward functional and medical goals  Care Tool:  Bathing    Body parts bathed by patient: Right arm, Left arm, Chest, Abdomen, Front perineal area, Right upper leg, Left upper leg, Face   Body parts bathed by helper: Left lower leg, Right lower leg, Buttocks     Bathing assist Assist Level: Moderate Assistance - Patient 50 - 74%     Upper Body Dressing/Undressing Upper body dressing   What is the patient wearing?: Pull over shirt    Upper body assist Assist Level: Moderate Assistance - Patient 50 - 74%    Lower Body Dressing/Undressing Lower body dressing      What is the patient wearing?: Pants, Incontinence brief     Lower body assist Assist for lower body dressing: Moderate Assistance - Patient 50 - 74%     Toileting Toileting  Toileting assist Assist for toileting: Maximal Assistance - Patient 25 - 49%     Transfers Chair/bed transfer  Transfers assist     Chair/bed transfer assist level: Contact Guard/Touching assist     Locomotion Ambulation   Ambulation assist      Assist level: Contact Guard/Touching assist Assistive device: No Device Max distance: 150'   Walk 10 feet activity   Assist     Assist level: Contact Guard/Touching assist Assistive device: No Device   Walk 50 feet activity   Assist    Assist level: Contact Guard/Touching assist Assistive device: No Device    Walk 150 feet activity   Assist    Assist level: Contact Guard/Touching assist Assistive device: No Device    Walk 10 feet on uneven surface  activity   Assist     Assist level: Minimal Assistance -  Patient > 75%     Wheelchair     Assist Is the patient using a wheelchair?: Yes Type of Wheelchair: Manual    Wheelchair assist level: Dependent - Patient 0% Max wheelchair distance: 150'    Wheelchair 50 feet with 2 turns activity    Assist        Assist Level: Dependent - Patient 0%   Wheelchair 150 feet activity     Assist      Assist Level: Dependent - Patient 0%   Blood pressure (!) 159/84, pulse (!) 55, temperature 97.9 F (36.6 C), resp. rate 18, height 5\' 7"  (1.702 m), weight 71.7 kg, SpO2 100%.  Medical Problem List and Plan: 1. 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  Etiology suspected to be A-fib not on anticoagulation versus large vessel disease             -patient may shower             -ELOS/Goals: 10 to 14 days, supervision to min assist with PT, OT, SLP          D/c date set for next Wednesday, hopefully- pt unhappy about it.   Con't CIR PT, OT and SLP  Trying to get pt PEG due to fluid intake that cannot do on his own- can eat, but not take fluid to keep up with current needs 2.  Antithrombotics: -DVT/anticoagulation:  Pharmaceutical: Heparin initiated for anticoagulation. PLAN TO TRANSITION TO ELIQUIS 5 MG BID ONCE ABLE TO SWALLOW. 9/28 still NPO 10/1- per pharmacy change change to Eliquis- stopped ASA and Heparin and added Eliquis - but then realized wasn't clear- called pharmacy again- will reach out to Dr Pearlean Brownie and see if need ASA at all? 10/2- ASA stopped per Dr Pearlean Brownie             -antiplatelet therapy: Aspirin 325 mg daily 3. Pain Management: tylenol as needed  9/30- denies pain- con't regimen 4. Mood/Behavior/Sleep: Provide emotional support             -antipsychotic agents: N/A 5. Neuropsych/cognition: This patient is not capable of making decisions on his own behalf. 6. Skin/Wound Care: Routine skin checks 7. Fluids/Electrolytes/Nutrition: Routine and analysis  with follow-up chemistries 8.  Dysphagia.  Currently NPO, Cortrak. Speech therapy follow-up.  9/27- pulled cortrak accidentally last night- out a few inches- per nursing- KUB looks OK- reviewed it- can use- mittens? Recheck modified barium swallow on Tuesday, October 1  10/1- pending MBSS  10/2- now on D3 nectar thick diet  10/5- Diet remains D1 with nectar thick.  Continue TF also  10/6 pt ate 25-50-65% yesterday, will reduce TF to 35cc/hr  10/8- will look into PEG 9.  Hypertension.  Imdur 60 mg daily.  Monitor with increased mobility.  Long-term blood pressure goal normotensive  9/26- will change Imdur to immediate release- d/w pharmacy- changed  10/6 -10/7bp controlled Vitals:   05/11/23 1955 05/12/23 0458  BP: 126/63 (!) 159/84  Pulse: (!) 55 (!) 55  Resp: 18 18  Temp: 98.3 F (36.8 C) 97.9 F (36.6 C)  SpO2: 100% 100%    10.  Bradycardia into the 40s.  Coreg held.  Avoid negative chronotropic medications.   Monitor with increased mobility  9/30-10/1- doing slightly better- in 50s- not 40's  10/2-10/5 HR in 50s-60's - con't regimen 11.  Hyperlipidemia.  Pravachol 12.  CKD stage IIIa.  Follow-up chemistries  9/30- Cr 1.48- up from 1.30- BUN up to 29- will increase Free water to 250 cc q4 - don't want to increase further due to [facility]-- and see how goes in labs tomorrow-   10/1- BUN up to 34 and Cr 1.54 up from 1.48- will consult nutritionist  for free water changes-   10/2- spoke with nutritonist- will change TF's to night time only- maintain fluids, but decrease protein- also adding supplements if doesn't eat enough- per pt/wife eating really well- will check BMP in AM  10/3- Pt's food downgraded to D1 diet- also BUN up to 42 up from 34 and Cr stable at 1.55- will give low dose IVFs to try and help turn this Around- will do 75cc for 12 hours 5pm to 5am- and recheck in AM with BMP  10/4- pending labs this AM- pt doesn't have labs drawn yet- pending  10/5 improved BUN/Cr  yesterday--will increase free water thru tube today   10/6 pt ate better yesterday   -cut nocturnal TF in half   -continue increased H20 flushes   -labs tomorrow  10/7- BUN/Cr stable- however on water flushes as well- explained to pt will need to take double the amount of Nectar thick fluids, if remove Cortrak. Will d/w SLP and dietitian  10/8- still getting 300cc free water and I think pt needs PEG_ esp since they downgraded diet to D1 nectar thick again this AM- will d/w pt  10/9- placed order for PEG placement by IR- will see when they think they can do.     Latest Ref Rng & Units 05/11/2023    5:20 AM 05/10/2023    6:12 AM 05/07/2023    8:03 AM  BMP  Glucose 70 - 99 mg/dL 161  096  045   BUN 8 - 23 mg/dL 34  34  33   Creatinine 0.61 - 1.24 mg/dL 4.09  8.11  9.14   Sodium 135 - 145 mmol/L 136  134  134   Potassium 3.5 - 5.1 mmol/L 4.8  4.4  4.5   Chloride 98 - 111 mmol/L 100  98  100   CO2 22 - 32 mmol/L 28  25  26    Calcium 8.9 - 10.3 mg/dL 9.1  9.0  9.2     13.  Diastolic congestive heart failure.  Monitor for any signs of fluid overload. 9/28- stable   9/30- weight stable- 10/1- no weight today- will d/w nursing   10/2- weight up slightly- will monitor for trend  10/3- 71.3 kg today- up 1 lb from yesterday-if goes above 73 kg, will give Lasix  10/5- Weight 71.2 kg this AM- stable  10/9- Weight stable- con't to monitor Filed Weights   05/10/23 0539 05/11/23 0745 05/12/23 0500  Weight: 71.8 kg 71 kg 71.7 kg    14.  History of tobacco/alcohol use/Cocaine.  Provide counseling 15.  Medical noncompliance.  Counseling 16.  Atrial fibrillation.  Hold Coreg due to bradycardia.  Continue ASA and heparin until able to swallow and can switch to Eliquis  10/1- changed to Eliquis per Pharmacy- can do- can be crushed- stopped ASA and heparin   17. Constipation  9/26- will give sorbitol after therapy today  9/27- no results with sorbitol- will give another Sorbitol but 60cc and then soap  suds enema if no results  10/1- cannot tell when having BM- has no control per wife- last went yesterday  10/4- LBM yesterday, but still has no control- we discussed doing suppository/bowel program- pt refused for now , but said he'd "think about it".   10/6- no bm since 10/3--add miralax and senokot-s at HS.   10/8-10/9 LBM yesterday- still having incontinence- pt doesn't want suppository-  18. Hyperkalemia  9/26- will recheck in AM- is 5.3- however was 4.1- so hesitant to give Lokelma without rechecking 9/27 - K+ 4.2- so likely hemolysis yesterday  10/6 k+ 4.5 10/4 19. Diabetes- on Tfs  9/26- will add SSI, since A1c 6.9 and on Tf's- do q4 hours per protocol.   9/30-10/1 CBGs running well- might need to change if gets diet with MBSS today  10/2- change to TID with meals- and monitor  10/3-10/5 CBGs reasonable control--no changes  10/7-10/9-great control-   CBG (last 3)  Recent Labs    05/11/23 1636 05/11/23 2049 05/12/23 0602  GLUCAP 98 119* 154*     I spent a total of 36   minutes on total care today- >50% coordination of care- due to  D/w PA and nursing about orders for PEG-  LOS: 14 days A FACE TO FACE EVALUATION WAS PERFORMED  Ry Moody 05/12/2023, 10:45 AM

## 2023-05-12 NOTE — Progress Notes (Signed)
Occupational Therapy Weekly Progress Note  Patient Details  Name: Timothy [name] MRN: [id number] Date of Birth: 1955-10-24  Beginning of progress report period: May 05, 2023 End of progress report period: May 12, 2023  Today's Date: 05/12/2023 OT Individual Time: 0900-1000 OT Individual Time Calculation (min): 60 min    Patient is progressing short term goals due to STG=LTG.  LOS extended d/t repeat MBS test for potential PEG tube placement d/t decreased ability to intake fluids. Pt still requiring assistance for Rt side awareness during functional mobility and FMC of RUE during ADLs such as LB dressing.   Patient continues to demonstrate the following deficits: muscle weakness, impaired timing and sequencing, unbalanced muscle activation, motor apraxia, decreased coordination, and decreased motor planning, and decreased standing balance, hemiplegia, and decreased balance strategies and therefore will continue to benefit from skilled OT intervention to enhance overall performance with BADL and Reduce care partner burden.  Patient progressing toward long term goals..  Continue plan of care.  OT Short Term Goals Week 2:  OT Short Term Goal 1 (Week 2): STG=LTG d/t ELOS  Skilled Therapeutic Interventions/Progress Updates:      Therapy Documentation Precautions:  Precautions Precautions: Fall Precaution Comments: R inattention; SBP <180, cortrak Restrictions Weight Bearing Restrictions: No General: "You're funny!" Pt supine in bed upon OT arrival, agreeable to OT session.  Pain: no pain reported  ADL: Bed mobility: SBA supine>EOB without use of bed rail Grooming: SBA standing at EOB to wipe face Toileting: SBA standing at EOB to use urinal, able to manage pants with increased time d/t limited coordination and FMC in  UB dressing: SBA with VC for hemi technique  LB dressing: SBA with increased time d/t decreased coordination of RUE, VC for hand/eye coordination to increased  functional use Bathing: SBA seated EOB and standing for sponge bathing, pt able to bathe all body areas, able to incorporate RUE for washing without VC Transfers: SBA with VC for Rt side awareness during ambulating in order to maneuver around obstacles  Exercises: Pt participated in [facility] activity with various resistance clothes pins, pt retrieving pins  with RUE and placing them onto horizontal/vertical lines  for increased dexterity and coordination in order to complete ADLs such as buttoning shirts. Pt completed tasks with PRN rest breaks d/t increased fatigue in RUE. Pt required VC for hand/eye coordination  Other Treatments: OT educated pt wife on level of assist required at home with cognitive tasks. OT educated wife on purpose of potential PEG tube placement for fluids.    Pt supine in bed with bed alarm activated, 2 bed rails up, call light within reach and 4Ps assessed.   Therapy/Group: Individual Therapy  Velia Meyer, OTD, OTR/L 05/12/2023, 3:43 PM

## 2023-05-12 NOTE — Progress Notes (Signed)
Physical Therapy Weekly Progress Note  Patient Details  Name: Timothy [name] MRN: [id number] Date of Birth: May 11, 1956  Beginning of progress report period: May 05, 2023 End of progress report period: May 12, 2023  Today's Date: 05/12/2023 PT Individual Time: 1115-1200, 1345-1430  PT Individual Time Calculation (min): 45 min , 45 min   Patient making excellent progress towards long term goals and largely limited due to decreased attention, awareness, fine motor control and dynamic gait.   Patient continues to demonstrate the following deficits decreased motor planning, decreased attention to right, and decreased postural control and therefore will continue to benefit from skilled PT intervention to increase functional independence with mobility.  Patient progressing toward long term goals..  Continue plan of care.  PT Short Term Goals Week 1:  PT Short Term Goal 1 (Week 1): Pt will complete sit to stand with CGA consistently PT Short Term Goal 1 - Progress (Week 1): Met PT Short Term Goal 2 (Week 1): Pt wil l complete bed to chair with CGA consistently. PT Short Term Goal 2 - Progress (Week 1): Met PT Short Term Goal 3 (Week 1): Pt willl ambulate x150' with CGA PT Short Term Goal 3 - Progress (Week 1): Met PT Short Term Goal 4 (Week 1): Pt will complete x13 steps with BHRs and CGA. PT Short Term Goal 4 - Progress (Week 1): Progressing toward goal Week 2:  PT Short Term Goal 1 (Week 2): STG's=LTG's due to ELOS PT Short Term Goal 1 - Progress (Week 2): Progressing toward goal Week 3:  PT Short Term Goal 1 (Week 3): STG's=LTG's due to ELOS  Skilled Therapeutic Interventions/Progress Updates:      Therapy Documentation Precautions:  Precautions Precautions: Fall Precaution Comments: R inattention; SBP <180, cortrak Restrictions Weight Bearing Restrictions: No   Treatment Session 1:   Pt agreeable to PT session with emphasis on functional mobility while navigating  environment. Pt participated in blocked practice with right hand opening and closing door knob to improve independence with accessibility at household and community level. Pt supervision with dynamic standing with supination and pronation with dowel to practice gripping to facilitate door accessibility. Pt requires supervision for dynamic gait with dowel and progressed to use of 3# dumb bell with right UE while performing supination/pronation, OH press, isometric OH press at 160 degrees of shoulder flexion. In sitting pt also demonstrates improved grip with 1.1 weighted ball with right forearm supination/pronation and OH press as pt unable to perform activity previously. Pt ambulatory without AD with supervision throughout session and left semi-reclined in bed with all needs in reach and alarm on.    Treatment Session 2:   Pt agreeable to PT session with emphasis on R hemibody awareness and integration of R UE with functional activities. Pt performed squats to retrieve linen and folded towels with bilateral UE's with mod cues for attention to right side. Pt positioned linen in basket and requires supervision with 8 inch step x 8 while gripping laundry basket to simulate household mobility. Pt ambulated to room and left semi-reclined in bed with all needs in reach and alarm on.   Therapy/Group: Individual Therapy  [name] [name] PT, DPT  05/12/2023, 7:59 AM

## 2023-05-13 ENCOUNTER — Inpatient Hospital Stay (HOSPITAL_COMMUNITY): Payer: Medicare PPO

## 2023-05-13 LAB — GLUCOSE, CAPILLARY
Glucose-Capillary: 155 mg/dL — ABNORMAL HIGH (ref 70–99)
Glucose-Capillary: 126 mg/dL — ABNORMAL HIGH (ref 70–99)
Glucose-Capillary: 107 mg/dL — ABNORMAL HIGH (ref 70–99)

## 2023-05-13 NOTE — Progress Notes (Signed)
PROGRESS NOTE   Subjective/Complaints:   LBM yesterday- doesn't want bowel program to stay continent.   Pt and wife asking about when to get PEG_ explained we are waiting to hear from IR.    No other changes so far. Per pt's wife or pt.   ROS: limited by aphasia Objective:   No results found. No results for input(s): "WBC", "HGB", "HCT", "PLT" in the last 72 hours.    Recent Labs    05/11/23 0520  NA 136  K 4.8  CL 100  CO2 28  GLUCOSE 142*  BUN 34*  CREATININE 1.52*  CALCIUM 9.1    Intake/Output Summary (Last 24 hours) at 05/13/2023 0856 Last data filed at 05/13/2023 0828 Gross per 24 hour  Intake 226 ml  Output 975 ml  Net -749 ml         Physical Exam: Vital Signs Blood pressure 123/89, pulse (!) 50, temperature 98.4 F (36.9 C), resp. rate 16, height 5\' 7"  (1.702 m), weight 72.1 kg, SpO2 100%.        General: awake, alert, appropriate, sitting up in bed; wife at bedside; NAD HENT: conjugate gaze; oropharynx dry- cortrak in place CV: regular rhythm, bradycardic rate; no JVD Pulmonary: CTA B/L; no W/R/R- good air movement GI: soft, NT, ND, (+)BS Psychiatric: appropriate- frustrated about d/c date still Neurological: aphasic- speaking at word level Ext: no clubbing, cyanosis, or edema Psych: flat but cooperative  Neuro: Oriented to person place but not time. Language more fluid and spontaneous. Speech clear. RUE: 4-/5 Deltoid, 4-/5 Biceps, 4-/5 Triceps, 4-/5 Grip--no significant change LUE: 5/5 Deltoid, 5/5 Biceps, 5/5 Triceps,  5/5 Grip RLE: HF 4/5, KE 4+/5, ADF 4+/5, APF 4+/5 LLE: HF 5/5, KE 5/5, ADF 5/5, APF 5/5 Incision intact light touch in all 4 extremities. Neurological exam stable today 05/13/2023 Musculoskeletal: no pain with PROM. Right upper extremity fifth digit contracture-wife reports this is chronic   Assessment/Plan: 1. Functional deficits which require 3+ hours per  day of interdisciplinary therapy in a comprehensive inpatient rehab setting. Physiatrist is providing close team supervision and 24 hour management of active medical problems listed below. Physiatrist and rehab team continue to assess barriers to discharge/monitor patient progress toward functional and medical goals  Care Tool:  Bathing    Body parts bathed by patient: Right arm, Left arm, Chest, Abdomen, Front perineal area, Right upper leg, Left upper leg, Face   Body parts bathed by helper: Left lower leg, Right lower leg, Buttocks     Bathing assist Assist Level: Moderate Assistance - Patient 50 - 74%     Upper Body Dressing/Undressing Upper body dressing   What is the patient wearing?: Pull over shirt    Upper body assist Assist Level: Moderate Assistance - Patient 50 - 74%    Lower Body Dressing/Undressing Lower body dressing      What is the patient wearing?: Pants, Incontinence brief     Lower body assist Assist for lower body dressing: Moderate Assistance - Patient 50 - 74%     Toileting Toileting    Toileting assist Assist for toileting: Maximal Assistance - Patient 25 - 49%     Transfers Chair/bed  transfer  Transfers assist     Chair/bed transfer assist level: Contact Guard/Touching assist     Locomotion Ambulation   Ambulation assist      Assist level: Contact Guard/Touching assist Assistive device: No Device Max distance: 150'   Walk 10 feet activity   Assist     Assist level: Contact Guard/Touching assist Assistive device: No Device   Walk 50 feet activity   Assist    Assist level: Contact Guard/Touching assist Assistive device: No Device    Walk 150 feet activity   Assist    Assist level: Contact Guard/Touching assist Assistive device: No Device    Walk 10 feet on uneven surface  activity   Assist     Assist level: Minimal Assistance - Patient > 75%     Wheelchair     Assist Is the patient using a  wheelchair?: Yes Type of Wheelchair: Manual    Wheelchair assist level: Dependent - Patient 0% Max wheelchair distance: 150'    Wheelchair 50 feet with 2 turns activity    Assist        Assist Level: Dependent - Patient 0%   Wheelchair 150 feet activity     Assist      Assist Level: Dependent - Patient 0%   Blood pressure 123/89, pulse (!) 50, temperature 98.4 F (36.9 C), resp. rate 16, height 5\' 7"  (1.702 m), weight 72.1 kg, SpO2 100%.  Medical Problem List and Plan: 1. 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  Etiology suspected to be A-fib not on anticoagulation versus large vessel disease             -patient may shower             -ELOS/Goals: 10 to 14 days, supervision to min assist with PT, OT, SLP          D/c date set for next Wednesday, hopefully- pt unhappy about it.   Con't CIR PT , OT and SLP  Trying to get pt PEG due to fluid intake that cannot do on his own- can eat, but not take fluid to keep up with current needs 2.  Antithrombotics: -DVT/anticoagulation:  Pharmaceutical: Heparin initiated for anticoagulation. PLAN TO TRANSITION TO ELIQUIS 5 MG BID ONCE ABLE TO SWALLOW. 9/28 still NPO 10/1- per pharmacy change change to Eliquis- stopped ASA and Heparin and added Eliquis - but then realized wasn't clear- called pharmacy again- will reach out to Dr Pearlean Brownie and see if need ASA at all? 10/2- ASA stopped per Dr Pearlean Brownie             -antiplatelet therapy: Aspirin 325 mg daily 3. Pain Management: tylenol as needed  9/30- denies pain- con't regimen 4. Mood/Behavior/Sleep: Provide emotional support             -antipsychotic agents: N/A 5. Neuropsych/cognition: This patient is not capable of making decisions on his own behalf. 6. Skin/Wound Care: Routine skin checks 7. Fluids/Electrolytes/Nutrition: Routine and analysis with follow-up chemistries 8.  Dysphagia.  Currently NPO, Cortrak.  Speech therapy follow-up.  9/27- pulled cortrak accidentally last night- out a few inches- per nursing- KUB looks OK- reviewed it- can use- mittens? Recheck modified barium swallow on Tuesday, October 1  10/1- pending MBSS  10/2- now on D3 nectar thick diet  10/5- Diet remains D1 with nectar thick. Continue TF also  10/6 pt ate 25-50-65% yesterday, will reduce TF to 35cc/hr  10/8- will look  into PEG  10/10- waiting to hear from IR- will call them if no note by noon 9.  Hypertension.  Imdur 60 mg daily.  Monitor with increased mobility.  Long-term blood pressure goal normotensive  9/26- will change Imdur to immediate release- d/w pharmacy- changed  10/6 -10/7bp controlled Vitals:   05/12/23 2025 05/13/23 0513  BP: 129/81 123/89  Pulse: 64 (!) 50  Resp: 17 16  Temp: 98 F (36.7 C) 98.4 F (36.9 C)  SpO2: 99% 100%    10.  Bradycardia into the 40s.  Coreg held.  Avoid negative chronotropic medications.   Monitor with increased mobility  9/30-10/1- doing slightly better- in 50s- not 40's  10/2-10/5 HR in 50s-60's - con't regimen 11.  Hyperlipidemia.  Pravachol 12.  CKD stage IIIa.  Follow-up chemistries  9/30- Cr 1.48- up from 1.30- BUN up to 29- will increase Free water to 250 cc q4 - don't want to increase further due to [facility]-- and see how goes in labs tomorrow-   10/1- BUN up to 34 and Cr 1.54 up from 1.48- will consult nutritionist  for free water changes-   10/2- spoke with nutritonist- will change TF's to night time only- maintain fluids, but decrease protein- also adding supplements if doesn't eat enough- per pt/wife eating really well- will check BMP in AM  10/3- Pt's food downgraded to D1 diet- also BUN up to 42 up from 34 and Cr stable at 1.55- will give low dose IVFs to try and help turn this Around- will do 75cc for 12 hours 5pm to 5am- and recheck in AM with BMP  10/4- pending labs this AM- pt doesn't have labs drawn yet- pending  10/5 improved BUN/Cr yesterday--will increase  free water thru tube today   10/6 pt ate better yesterday   -cut nocturnal TF in half   -continue increased H20 flushes   -labs tomorrow  10/7- BUN/Cr stable- however on water flushes as well- explained to pt will need to take double the amount of Nectar thick fluids, if remove Cortrak. Will d/w SLP and dietitian  10/8- still getting 300cc free water and I think pt needs PEG_ esp since they downgraded diet to D1 nectar thick again this AM- will d/w pt  10/9- placed order for PEG placement by IR- will see when they think they can do.   10/10- checking with IR about plan- what do we need to do with Eliquis/ do we need Heparin gtt or tx dose lovenox or what so can get PEG placed    Latest Ref Rng & Units 05/11/2023    5:20 AM 05/10/2023    6:12 AM 05/07/2023    8:03 AM  BMP  Glucose 70 - 99 mg/dL 161  096  045   BUN 8 - 23 mg/dL 34  34  33   Creatinine 0.61 - 1.24 mg/dL 4.09  8.11  9.14   Sodium 135 - 145 mmol/L 136  134  134   Potassium 3.5 - 5.1 mmol/L 4.8  4.4  4.5   Chloride 98 - 111 mmol/L 100  98  100   CO2 22 - 32 mmol/L 28  25  26    Calcium 8.9 - 10.3 mg/dL 9.1  9.0  9.2     13.  Diastolic congestive heart failure.  Monitor for any signs of fluid overload. 9/28- stable   9/30- weight stable- 10/1- no weight today- will d/w nursing   10/2- weight up slightly- will monitor for trend  10/3- 71.3 kg today- up 1 lb from yesterday-if goes above 73 kg, will give Lasix  10/5- Weight 71.2 kg this AM- stable  10/9-10/10 Weight stable- con't to monitor Filed Weights   05/11/23 0745 05/12/23 0500 05/13/23 0513  Weight: 71 kg 71.7 kg 72.1 kg    14.  History of tobacco/alcohol use/Cocaine.  Provide counseling 15.  Medical noncompliance.  Counseling 16.  Atrial fibrillation.  Hold Coreg due to bradycardia.  Continue ASA and heparin until able to swallow and can switch to Eliquis  10/1- changed to Eliquis per Pharmacy- can do- can be crushed- stopped ASA and heparin   17.  Constipation  9/26- will give sorbitol after therapy today  9/27- no results with sorbitol- will give another Sorbitol but 60cc and then soap suds enema if no results  10/1- cannot tell when having BM- has no control per wife- last went yesterday  10/4- LBM yesterday, but still has no control- we discussed doing suppository/bowel program- pt refused for now , but said he'd "think about it".   10/6- no bm since 10/3--add miralax and senokot-s at HS.   10/8-10/9 LBM yesterday- still having incontinence- pt doesn't want suppository-   10/10- LBM yesterday- still having incontinence- doesn't want bowel program 18. Hyperkalemia  9/26- will recheck in AM- is 5.3- however was 4.1- so hesitant to give Lokelma without rechecking 9/27 - K+ 4.2- so likely hemolysis yesterday  10/6 k+ 4.5 10/4 19. Diabetes- on Tfs  9/26- will add SSI, since A1c 6.9 and on Tf's- do q4 hours per protocol.   9/30-10/1 CBGs running well- might need to change if gets diet with MBSS today  10/2- change to TID with meals- and monitor  10/3-10/5 CBGs reasonable control--no changes  10/7-10/9-great control-   CBG (last 3)  Recent Labs    05/12/23 1204 05/12/23 1647 05/13/23 0623  GLUCAP 73 108* 155*     I spent a total of 37    minutes on total care today- >50% coordination of care- due to  D/w PA and IR about PEG   LOS: 15 days A FACE TO FACE EVALUATION WAS PERFORMED  Shonte Soderlund 05/13/2023, 8:56 AM

## 2023-05-13 NOTE — Progress Notes (Signed)
Physical Therapy Session Note  Patient Details  Name: Timothy [name] MRN: [id number] Date of Birth: [birth year]  Today's Date: 05/13/2023 PT Individual Time: 1305-1418 PT Individual Time Calculation (min): 73 min   Short Term Goals: Week 3:  PT Short Term Goal 1 (Week 3): STG's=LTG's due to ELOS  Skilled Therapeutic Interventions/Progress Updates:      Therapy Documentation Precautions:  Precautions Precautions: Fall Precaution Comments: R inattention; SBP <180, cortrak Restrictions Weight Bearing Restrictions: No  Pt agreeable to PT with emphasis on right hemibody NMR and awareness with strength and balance/coordination exercises. Pt without reports of pain and supervision with bed mobility, transfers and gait >250 ft without AD.   Pt min/mod A for modified standing plank with bilateral hand grip on flat surface of half bosu x 10 seconds and transitioned to bosu ball push up's 8 + 5.   Pt requires min/mod A for dynamic balance while standing on compliant surface on bosu ball during dynamic reaching with right hand for horse shoes and for placement on basket ball hoop.   Pt performed Nu Step at level 5 x 10 min and able to maintain right hand grip with handle bar for entirety of 10 minutes. Pt unable to maintain grip previously with bike and demonstrates improved wrist and hand strength.   Pt demonstrated improved right UE integration with bouncing, throwing, and catching medium size physio ball in seated/standing positions (supervision). Pt able to pick ball from floor level and demonstrates good reactive balance skills.   Pt returned to room and left semi-reclined in bed with all needs in reach and alarm on.    Therapy/Group: Individual Therapy  [name] [name] PT, DPT  05/13/2023, 7:51 AM

## 2023-05-13 NOTE — Procedures (Signed)
Modified Barium Swallow Study  Patient Details  Name: Timothy [name] MRN: [id number] Date of Birth: 1956/04/05  Today's Date: 05/13/2023  Modified Barium Swallow completed.  Full report located under Chart Review in the Imaging Section.  History of Present Illness Patient is 67 y.o. 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 Pt s/p revascularization of the of the superior branch of the right MCA with mechanical thrombectomy achieving TICI 2C revascularization. PMH: ETOH abuse, CHF, CKD, HTN. MBS completed 04/26/23 revealed: "oropharyngeal dysphagia includ(ing) timing, motor, and sensory components. He has reduced lingual control with disorganized posterior transit although minimal oral residue. There is anterior loss out of the R side of his mouth though. He has reduced hyolaryngeal movement and laryngeal vestibule closure, and when swallow is mistimed, he penetrates during the swallow across consistencies. Penetration also occurred occasionally from intermittent vallecular residue. Pt's volitional cough is weak and ineffective at clearing penetrates, which continue to fall over time until aspiration occurs. Aspiration for the most part is silent, although spontaneous coughing is also not effective." Repeated MBS 05/04/23 with subsequent upgrade to Dys1/NTL diet.  Clinical Impression Pt presents with oropharyngeal dysphagia primarily characterized by reduced oral bolus hold resulting in posterior spillage into the pharynx and delay in swallow initiation lasting between 1-3 seconds at the level of the pyriform sinuses. Swallow delay/mistiming and pooling of bolus into the pyriforms prior to swallow initiation resulted in trace silent aspiration of 4/5 administered thin liquids boluses from teaspoon despite trials of bolus hold and chin tuck strategies. Residue in the pharynx improved from previous swallowing assessment. Penetration above the level  of the vocal folds which cleared with completion of the swallow was observed with NTLs, however no other penetration/aspiration observed across consistencies. Recommend continuation of Dys1/NTL diet with pills administered whole in puree. Dys1 solids recommended given patient's poor tolerance of more solid POs during bedside assessments of diet tolerance and trials of upgraded textures. SLP will continue to endorse use of water protocol via Provale for hydration with rules posted in room for nursing staff. Ensure patient is only utilizing Provale cup between meals and not alongside them.  Factors that may increase risk of adverse event in presence of aspiration [name] [employer] [name] 2021): Reduced cognitive function;Weak cough;Presence of tubes (ETT, trach, NG, etc.)  Swallow Evaluation Recommendations Recommendations: PO diet PO Diet Recommendation: Dysphagia 1 (Pureed);Mildly thick liquids (Level 2, nectar thick) Liquid Administration via: Cup;Straw Medication Administration: Crushed with puree Supervision: Full supervision/cueing for swallowing strategies;Patient able to self-feed Swallowing strategies  : Minimize environmental distractions;Slow rate;Check for pocketing or oral holding;Small bites/sips Postural changes: Position pt fully upright for meals Oral care recommendations: Oral care QID (4x/day) Caregiver Recommendations: Have oral suction available  [name], M.A., CCC-SLP   Morrie Sheldon A Ivionna Verley 05/13/2023,10:06 AM

## 2023-05-13 NOTE — Progress Notes (Signed)
Speech Language Pathology Weekly Progress Note  Patient Details  Name: Timothy [name] MRN: [id number] Date of Birth: Dec 09, 1955  Beginning of progress report period: May 06, 2023 End of progress report period: May 13, 2023  Short Term Goals: Week 2: SLP Short Term Goal 1 (Week 2): STGs=LTGs d/t ELOS SLP Short Term Goal 1 - Progress (Week 2): Not met  *Anticipated patient discharge was 05/13/23, thus SLP set STGs to equal LTGs for patient's final reporting period. Patient ELOS recently extended to 05/19/23, thus SLP will continue to work towards long term goals.    New Short Term Goals: Week 3: SLP Short Term Goal 1 (Week 3): STGs=LTGs d/t ELOS  Weekly Progress Updates: Patient is making gains towards therapy goals this admission, making steady progress towards all long term goals set. Patient originally intended to discharge 05/13/23, however given concerns for ability to adequately hydrate, discharge date moved to 05/19/23. SLP will continue to work towards long term goals this reporting period. SLP repeated MBS to assess any potential improvements to swallowing function with patient continuing to tolerate Dys1/NTL diet while aspirating all thin liquid trials. Given concerns for ability to adequately hydrate upon discharge, medical team to likely proceed with PEG tube plan given stable swallowing deficits. Patient is currently at an overall modA level for auditory comprehension (minA for simple language and single step commands), and maxA for naming, expression, and item identification. Patient sustains attention to task with supervision-minA for 30 minutes. Patient consistently disoriented to month, though difficult to ascertain exact cognitive function at this time given significant language deficits. Patient and family education ongoing. Patient will continue to benefit from skilled therapy services during remainder of CIR stay.    Intensity: Minumum of 1-2 x/day, 30 to 90  minutes Frequency: 3 to 5 out of 7 days Duration/Length of Stay: 10/10 Treatment/Interventions: Cognitive remediation/compensation;Cueing hierarchy;Environmental controls;Dysphagia/aspiration precaution training;Functional tasks;Internal/external aids;Patient/family education;Speech/Language facilitation;Therapeutic Activities  [name], M.A., CCC-SLP  [name] 05/13/2023, 10:13 AM

## 2023-05-13 NOTE — Progress Notes (Signed)
Occupational Therapy Session Note  Patient Details  Name: Timothy [name] MRN: [id number] Date of Birth: Apr 03, 1956  Today's Date: 05/13/2023 OT Individual Time: 0800-0830 OT Individual Time Calculation (min): 30 min    Short Term Goals: Week 2:  OT Short Term Goal 1 (Week 2): STG=LTG d/t ELOS  Skilled Therapeutic Interventions/Progress Updates:      Therapy Documentation Precautions:  Precautions Precautions: Fall Precaution Comments: R inattention; SBP <180, cortrak Restrictions Weight Bearing Restrictions: No General: "Oh man!" Pt supine in bed upon OT arrival, agreeable to OT session.  Pain: no pain reported  ADL: Toilet transfer: SBA no AD and managing with IV pole bed ><toilet Toileting: SBA with VC for initiation to complete posterior hygiene after BM Transfers: SBA ambualting around therapy unit while pushing IV pole with RUE, able to maintain Rt side awareness with no VC during ambulation  Exercises:  Other Treatments: Pt demo'd pronation/supination activity with RUE with flipping cards side to side. Pt then asked to name pictures of animals on cards with moderate difficulty requiring VC for hints. Pt completing activity in order to improve [facility] and coordination as well as word retrieval in order to complete ADLs such as grooming. Pt seated in chair in distracting environment in order to increase challenge. Pt with assistance receiving cards by OT modigying activity by giving pt card one at a time.   Pt supine in bed with bed alarm activated, 2 bed rails up, call light within reach and 4Ps assessed.    Therapy/Group: Individual Therapy  Velia Meyer, OTD, OTR/L 05/13/2023, 8:35 AM

## 2023-05-13 NOTE — Progress Notes (Signed)
Occupational Therapy Session Note  Patient Details  Name: Timothy [name] MRN: [id number] Date of Birth: August 23, 1955  Today's Date: 05/13/2023 OT Individual Time: 4332-9518 OT Individual Time Calculation (min): 70 min    Short Term Goals: Week 2:  OT Short Term Goal 1 (Week 2): STG=LTG d/t ELOS  Skilled Therapeutic Interventions/Progress Updates:    Pt resting in bed upon arrival. OT intervention with focus on functional amb wihtout AD and RUE functional use. Amb with CGA and assistance for managing feeding equipment. Grasp/release activities with colored peg board and Squigz. NuStep 7 mins at level 6. Pt returned to room and returned to bed. All needs wihtin reach. Wife present. Bed alarm activated.   Therapy Documentation Precautions:  Precautions Precautions: Fall Precaution Comments: R inattention; SBP <180, cortrak Restrictions Weight Bearing Restrictions: No   Pain: Pain Assessment Pain Scale: 0-10 Pain Score: 0-No pain   Therapy/Group: Individual Therapy  Rich Brave 05/13/2023, 11:59 AM

## 2023-05-14 ENCOUNTER — Encounter (HOSPITAL_COMMUNITY): Payer: Self-pay | Admitting: Physical Medicine and Rehabilitation

## 2023-05-14 LAB — GLUCOSE, CAPILLARY
Glucose-Capillary: 152 mg/dL — ABNORMAL HIGH (ref 70–99)
Glucose-Capillary: 122 mg/dL — ABNORMAL HIGH (ref 70–99)
Glucose-Capillary: 96 mg/dL (ref 70–99)
Glucose-Capillary: 151 mg/dL — ABNORMAL HIGH (ref 70–99)

## 2023-05-14 MED ORDER — APIXABAN 5 MG PO TABS
5.0000 mg | ORAL_TABLET | Freq: Two times a day (BID) | ORAL | Status: AC
  Administered 2023-05-14: 5 mg via ORAL
  Filled 2023-05-14: qty 1

## 2023-05-14 MED ORDER — HEPARIN (PORCINE) 25000 UT/250ML-% IV SOLN
1050.0000 [IU]/h | INTRAVENOUS | Status: AC
  Administered 2023-05-15 – 2023-05-16 (×2): 1100 [IU]/h via INTRAVENOUS
  Filled 2023-05-14 (×2): qty 250

## 2023-05-14 NOTE — Consult Note (Signed)
gallbladder wall thickening, or biliary dilatation. Pancreas: Unremarkable. No pancreatic ductal dilatation or surrounding inflammatory changes. Spleen: No splenic injury or perisplenic hematoma. Adrenals/Urinary Tract: No adrenal masses. Stable bilateral simple Bosniak 1 renal cysts requiring no follow-up. No hydronephrosis or renal calculi. Stomach/Bowel: Feeding tube enters the stomach and terminates in the region of the antrum of the stomach. No hiatal hernia. The stomach is normally positioned and there is no interposition of colon or liver between the body of the stomach and the abdominal wall. No evidence of bowel obstruction or significant ileus. There is some barium in the colon, presumably related to recent modified barium swallow studies. No free intraperitoneal air. Vascular/Lymphatic: Atherosclerosis of the abdominal aorta without aneurysm. No lymphadenopathy identified. Other: No hernias, ascites or focal fluid collections.  No anasarca. Musculoskeletal: No acute or significant osseous findings. IMPRESSION: 1. Feeding tube enters the stomach and terminates in the region of the antrum of the stomach. The stomach is normally positioned and there is no interposition of colon or liver between the body of the stomach and the abdominal wall. There is no anatomic contraindication to attempted percutaneous gastrostomy tube placement. 2. Coronary  atherosclerosis. 3. Aortic atherosclerosis. Aortic Atherosclerosis (ICD10-I70.0). Electronically Signed   By: Irish Lack M.D.   On: 05/13/2023 13:43   DG Swallowing Func-Speech Pathology  Result Date: 05/13/2023 Table formatting from the original result was not included. Modified Barium Swallow Study Patient Details Name: Timothy [name] MRN: [id number] Date of Birth: 1956-05-16 Today's Date: 05/13/2023 HPI/PMH: HPI: Patient is 67 y.o. 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 Pt s/p revascularization of the of the superior branch of the right MCA with mechanical thrombectomy achieving TICI 2C revascularization. PMH: ETOH abuse, CHF, CKD, HTN. MBS completed 04/26/23 revealed: "oropharyngeal dysphagia includ(ing) timing, motor, and sensory components. He has reduced lingual control with disorganized posterior transit although minimal oral residue. There is anterior loss out of the R side of his mouth though. He has reduced hyolaryngeal movement and laryngeal vestibule closure, and when swallow is mistimed, he penetrates during the swallow across consistencies. Penetration also occurred occasionally from intermittent vallecular residue. Pt's volitional cough is weak and ineffective at clearing penetrates, which continue to fall over time until aspiration occurs. Aspiration for the most part is silent, although spontaneous coughing is also not effective." Repeated MBS 05/04/23 with subsequent upgrade to Dys1/NTL diet. Clinical Impression: Clinical Impression: Pt presents with oropharyngeal dysphagia primarily characterized by reduced oral bolus hold resulting in posterior spillage into the pharynx and delay in swallow initiation lasting between 1-3 seconds at the level of the pyriform sinuses. Swallow delay/mistiming and pooling of bolus into the pyriforms prior to swallow initiation resulted in trace silent aspiration of 4/5 administered thin liquids  boluses from teaspoon despite trials of bolus hold and chin tuck strategies. Residue in the pharynx improved from previous swallowing assessment. Penetration above the level of the vocal folds which cleared with completion of the swallow was observed with NTLs, however no other penetration/aspiration observed across consistencies. Recommend continuation of Dys1/NTL diet with pills administered whole in puree. Dys1 solids recommended given patient's poor tolerance of more solid POs during bedside assessments of diet tolerance and trials of upgraded textures. SLP will continue to endorse use of water protocol via Provale for hydration with rules posted in room for nursing staff. Ensure patient is only utilizing Provale cup between meals and not alongside them. Factors that may increase risk  Prior to this Study: Dysphagia 1 (pureed); Mildly thick liquids (Level 2, nectar thick)   Temperature : Normal   Respiratory Status: WFL   Supplemental O2: None (Room air)   History of Recent Intubation: Yes  Behavior/Cognition: Alert; Cooperative; Requires cueing; Pleasant mood Self-Feeding Abilities: Able to self-feed Baseline vocal quality/speech: Normal Volitional Cough: Able to elicit Volitional Swallow: Able to elicit Exam Limitations: No limitations Goal Planning: Prognosis for improved oropharyngeal function: Guarded Barriers to  Reach Goals: Language deficits; Cognitive deficits Barriers/Prognosis Comment: severity of deficits Patient/Family Stated Goal: none stated Consulted and agree with results and recommendations: Patient Pain: Pain Assessment Pain Assessment: No/denies pain Faces Pain Scale: 0 End of Session: Start Time:No data recorded Stop Time: No data recorded Time Calculation:No data recorded Charges: No data recorded SLP visit diagnosis: SLP Visit Diagnosis: Dysphagia, oropharyngeal phase (R13.12) Past Medical History: Past Medical History: Diagnosis Date  Alcohol abuse   Atrial fibrillation (HCC)   CHF (congestive heart failure) (HCC)   CKD (chronic kidney disease) stage 3, GFR 30-59 ml/min (HCC)   Hyperlipidemia   Hypertension   Noncompliance  Past Surgical History: Past Surgical History: Procedure Laterality Date  INGUINAL HERNIA REPAIR Right 07/17/2022  Procedure: HERNIA REPAIR INGUINAL INCARCERATED;  Surgeon: Lucretia Roers, MD;  Location: [facility];  Service: General;  Laterality: Right;  IR CT HEAD LTD  04/21/2023  IR PERCUTANEOUS ART THROMBECTOMY/INFUSION INTRACRANIAL INC DIAG ANGIO  04/21/2023  RADIOLOGY WITH ANESTHESIA N/A 04/21/2023  Procedure: IR WITH ANESTHESIA;  Surgeon: Radiologist, Medication, MD;  Location: [facility];  Service: Radiology;  Laterality: N/A;  XI ROBOTIC ASSISTED INGUINAL HERNIA REPAIR WITH MESH Left 03/19/2023  Procedure: XI ROBOTIC ASSISTED INGUINAL HERNIA REPAIR WITH MESH;  Surgeon: Franky Macho, MD;  Location: [facility];  Service: General;  Laterality: Left; [name], M.A., CCC-SLP [name] 05/13/2023, 10:10 AM  DG Swallowing Func-Speech Pathology  Result Date: 05/04/2023 Table formatting from the original result was not included. Modified Barium Swallow Study Patient Details Name: Timothy [name] MRN: [id number] Date of Birth: 07/01/1956 Today's Date: 05/04/2023 HPI/PMH: HPI: Patient is 67 y.o. male who presented with acute aphasia and R facial droop on 9/18 and found to have L MCA  occlusion. Pt s/p revascularization of the of the superior branch of the right MCA with mechanical thrombectomy achieving TICI 2C revascularization. PMH: ETOH abuse, CHF, CKD, HTN. MBS completed 04/26/23 revealed: "oropharyngeal dysphagia includ(ing) timing, motor, and sensory components. He has reduced lingual control with disorganized posterior transit although minimal oral residue. There is anterior loss out of the R side of his mouth though. He has reduced hyolaryngeal movement and laryngeal vestibule closure, and when swallow is mistimed, he penetrates during the swallow across consistencies. Penetration also occurred occasionally from intermittent vallecular residue. Pt's volitional cough is weak and ineffective at clearing penetrates, which continue to fall over time until aspiration occurs. Aspiration for the most part is silent, although spontaneous coughing is also not effective." Clinical Impression: Clinical Impression: Pt presents with oropharyngeal dysphagia primarily characterized by cognitively reduced oral bolus control resulting in posterior spillage prior to swallow initiation. Delay in swallow initiation lasts between 1-3 seconds with liquids boluses and 8-10 seconds with regular solids. With cup sips of thin liquids, swallow initiation is at the level of the pyriform sinuses with bolus material entering the laryngeal vestibule and dipping below the vocal folds (PAS 6) x2, then spontaneously ejected from the subglottal space with completion of the swallow (no cough observed). Once ejected from subglottal space,  Prior to this Study: Dysphagia 1 (pureed); Mildly thick liquids (Level 2, nectar thick)   Temperature : Normal   Respiratory Status: WFL   Supplemental O2: None (Room air)   History of Recent Intubation: Yes  Behavior/Cognition: Alert; Cooperative; Requires cueing; Pleasant mood Self-Feeding Abilities: Able to self-feed Baseline vocal quality/speech: Normal Volitional Cough: Able to elicit Volitional Swallow: Able to elicit Exam Limitations: No limitations Goal Planning: Prognosis for improved oropharyngeal function: Guarded Barriers to  Reach Goals: Language deficits; Cognitive deficits Barriers/Prognosis Comment: severity of deficits Patient/Family Stated Goal: none stated Consulted and agree with results and recommendations: Patient Pain: Pain Assessment Pain Assessment: No/denies pain Faces Pain Scale: 0 End of Session: Start Time:No data recorded Stop Time: No data recorded Time Calculation:No data recorded Charges: No data recorded SLP visit diagnosis: SLP Visit Diagnosis: Dysphagia, oropharyngeal phase (R13.12) Past Medical History: Past Medical History: Diagnosis Date  Alcohol abuse   Atrial fibrillation (HCC)   CHF (congestive heart failure) (HCC)   CKD (chronic kidney disease) stage 3, GFR 30-59 ml/min (HCC)   Hyperlipidemia   Hypertension   Noncompliance  Past Surgical History: Past Surgical History: Procedure Laterality Date  INGUINAL HERNIA REPAIR Right 07/17/2022  Procedure: HERNIA REPAIR INGUINAL INCARCERATED;  Surgeon: Lucretia Roers, MD;  Location: [facility];  Service: General;  Laterality: Right;  IR CT HEAD LTD  04/21/2023  IR PERCUTANEOUS ART THROMBECTOMY/INFUSION INTRACRANIAL INC DIAG ANGIO  04/21/2023  RADIOLOGY WITH ANESTHESIA N/A 04/21/2023  Procedure: IR WITH ANESTHESIA;  Surgeon: Radiologist, Medication, MD;  Location: [facility];  Service: Radiology;  Laterality: N/A;  XI ROBOTIC ASSISTED INGUINAL HERNIA REPAIR WITH MESH Left 03/19/2023  Procedure: XI ROBOTIC ASSISTED INGUINAL HERNIA REPAIR WITH MESH;  Surgeon: Franky Macho, MD;  Location: [facility];  Service: General;  Laterality: Left; [name], M.A., CCC-SLP [name] 05/13/2023, 10:10 AM  DG Swallowing Func-Speech Pathology  Result Date: 05/04/2023 Table formatting from the original result was not included. Modified Barium Swallow Study Patient Details Name: Timothy [name] MRN: [id number] Date of Birth: 07/01/1956 Today's Date: 05/04/2023 HPI/PMH: HPI: Patient is 67 y.o. male who presented with acute aphasia and R facial droop on 9/18 and found to have L MCA  occlusion. Pt s/p revascularization of the of the superior branch of the right MCA with mechanical thrombectomy achieving TICI 2C revascularization. PMH: ETOH abuse, CHF, CKD, HTN. MBS completed 04/26/23 revealed: "oropharyngeal dysphagia includ(ing) timing, motor, and sensory components. He has reduced lingual control with disorganized posterior transit although minimal oral residue. There is anterior loss out of the R side of his mouth though. He has reduced hyolaryngeal movement and laryngeal vestibule closure, and when swallow is mistimed, he penetrates during the swallow across consistencies. Penetration also occurred occasionally from intermittent vallecular residue. Pt's volitional cough is weak and ineffective at clearing penetrates, which continue to fall over time until aspiration occurs. Aspiration for the most part is silent, although spontaneous coughing is also not effective." Clinical Impression: Clinical Impression: Pt presents with oropharyngeal dysphagia primarily characterized by cognitively reduced oral bolus control resulting in posterior spillage prior to swallow initiation. Delay in swallow initiation lasts between 1-3 seconds with liquids boluses and 8-10 seconds with regular solids. With cup sips of thin liquids, swallow initiation is at the level of the pyriform sinuses with bolus material entering the laryngeal vestibule and dipping below the vocal folds (PAS 6) x2, then spontaneously ejected from the subglottal space with completion of the swallow (no cough observed). Once ejected from subglottal space,  administered: Puree Puree: WFL Penetration/Aspiration Scale Score: Penetration/Aspiration Scale Score 1.  Material does not enter airway: Moderately thick liquids (Level 3, honey thick); Puree; Solid; Pill 2.  Material enters airway, remains ABOVE vocal cords then ejected out: Mildly thick liquids (Level 2, nectar thick) 8.  Material enters airway, passes BELOW cords without attempt by patient to eject out (silent aspiration) : Thin liquids (Level 0) Compensatory Strategies: Compensatory Strategies Compensatory strategies: No   General Information: Caregiver present: No  Diet Prior to this Study: NPO   Temperature : Normal   Respiratory Status: WFL   Supplemental O2: None (Room air)   History of Recent Intubation: Yes  Behavior/Cognition: Alert; Cooperative; Requires cueing; Pleasant mood Self-Feeding Abilities: Able to self-feed Baseline vocal quality/speech: Normal No data recorded Volitional Swallow: Able to elicit (weak) Exam Limitations: No limitations Goal Planning: Prognosis for improved oropharyngeal function: Good Barriers to Reach Goals: Language deficits; Cognitive deficits No data recorded Patient/Family Stated Goal: none stated Consulted and agree with results and recommendations: Patient Pain: Pain Assessment Pain Assessment: No/denies pain End of Session: Start Time:No data recorded Stop Time: No data recorded Time Calculation:No data recorded Charges: No data recorded SLP visit diagnosis: SLP Visit Diagnosis: Dysphagia, oropharyngeal phase (R13.12) Past Medical History: Past Medical History: Diagnosis Date  Alcohol abuse   Atrial fibrillation (HCC)   CHF (congestive heart failure) (HCC)   CKD (chronic kidney disease) stage 3, GFR 30-59 ml/min (HCC)   Hyperlipidemia    Hypertension   Noncompliance  Past Surgical History: Past Surgical History: Procedure Laterality Date  INGUINAL HERNIA REPAIR Right 07/17/2022  Procedure: HERNIA REPAIR INGUINAL INCARCERATED;  Surgeon: Lucretia Roers, MD;  Location: [facility];  Service: General;  Laterality: Right;  IR CT HEAD LTD  04/21/2023  IR PERCUTANEOUS ART THROMBECTOMY/INFUSION INTRACRANIAL INC DIAG ANGIO  04/21/2023  RADIOLOGY WITH ANESTHESIA N/A 04/21/2023  Procedure: IR WITH ANESTHESIA;  Surgeon: Radiologist, Medication, MD;  Location: [facility];  Service: Radiology;  Laterality: N/A;  XI ROBOTIC ASSISTED INGUINAL HERNIA REPAIR WITH MESH Left 03/19/2023  Procedure: XI ROBOTIC ASSISTED INGUINAL HERNIA REPAIR WITH MESH;  Surgeon: Franky Macho, MD;  Location: [facility];  Service: General;  Laterality: Left; [name], M.A., CCC-SLP [name] 05/04/2023, 12:07 PM  DG Abd 1 View  Result Date: 04/29/2023 CLINICAL DATA:  Encounter for feeding tube EXAM: ABDOMEN - 1 VIEW COMPARISON:  Abdominal x-ray 04/23/2023 FINDINGS: Feeding tube tip is in the fundus of the stomach. Bowel-gas pattern is nonobstructive. Contrast is seen throughout the colon. Radiopaque foreign body is noted soft tissues of the right hip, unchanged. IMPRESSION: Feeding tube tip is in the fundus of the stomach. Electronically Signed   By: Darliss Cheney M.D.   On: 04/29/2023 23:39   DG Swallowing Func-Speech Pathology  Result Date: 04/26/2023 Table formatting from the original result was not included. Modified Barium Swallow Study Patient Details Name: Timothy [name] MRN: [id number] Date of Birth: Mar 10, 1956 Today's Date: 04/26/2023 HPI/PMH: HPI: Patient is 67 y.o. 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 Pt s/p revascularization of the of the superior branch of the right MCA with mechanical thrombectomy achieving TICI 2C revascularization. PMH: ETOH abuse, CHF, CKD, HTN. Clinical Impression: Clinical Impression: Pt's  oropharyngeal dysphagia includes timing, motor, and sensory components. He has reduced lingual control with disorganized posterior transit although minimal oral residue. There is anterior loss out of the R side of his mouth though. He has  gallbladder wall thickening, or biliary dilatation. Pancreas: Unremarkable. No pancreatic ductal dilatation or surrounding inflammatory changes. Spleen: No splenic injury or perisplenic hematoma. Adrenals/Urinary Tract: No adrenal masses. Stable bilateral simple Bosniak 1 renal cysts requiring no follow-up. No hydronephrosis or renal calculi. Stomach/Bowel: Feeding tube enters the stomach and terminates in the region of the antrum of the stomach. No hiatal hernia. The stomach is normally positioned and there is no interposition of colon or liver between the body of the stomach and the abdominal wall. No evidence of bowel obstruction or significant ileus. There is some barium in the colon, presumably related to recent modified barium swallow studies. No free intraperitoneal air. Vascular/Lymphatic: Atherosclerosis of the abdominal aorta without aneurysm. No lymphadenopathy identified. Other: No hernias, ascites or focal fluid collections.  No anasarca. Musculoskeletal: No acute or significant osseous findings. IMPRESSION: 1. Feeding tube enters the stomach and terminates in the region of the antrum of the stomach. The stomach is normally positioned and there is no interposition of colon or liver between the body of the stomach and the abdominal wall. There is no anatomic contraindication to attempted percutaneous gastrostomy tube placement. 2. Coronary  atherosclerosis. 3. Aortic atherosclerosis. Aortic Atherosclerosis (ICD10-I70.0). Electronically Signed   By: Irish Lack M.D.   On: 05/13/2023 13:43   DG Swallowing Func-Speech Pathology  Result Date: 05/13/2023 Table formatting from the original result was not included. Modified Barium Swallow Study Patient Details Name: Timothy [name] MRN: [id number] Date of Birth: 1956-05-16 Today's Date: 05/13/2023 HPI/PMH: HPI: Patient is 67 y.o. 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 Pt s/p revascularization of the of the superior branch of the right MCA with mechanical thrombectomy achieving TICI 2C revascularization. PMH: ETOH abuse, CHF, CKD, HTN. MBS completed 04/26/23 revealed: "oropharyngeal dysphagia includ(ing) timing, motor, and sensory components. He has reduced lingual control with disorganized posterior transit although minimal oral residue. There is anterior loss out of the R side of his mouth though. He has reduced hyolaryngeal movement and laryngeal vestibule closure, and when swallow is mistimed, he penetrates during the swallow across consistencies. Penetration also occurred occasionally from intermittent vallecular residue. Pt's volitional cough is weak and ineffective at clearing penetrates, which continue to fall over time until aspiration occurs. Aspiration for the most part is silent, although spontaneous coughing is also not effective." Repeated MBS 05/04/23 with subsequent upgrade to Dys1/NTL diet. Clinical Impression: Clinical Impression: Pt presents with oropharyngeal dysphagia primarily characterized by reduced oral bolus hold resulting in posterior spillage into the pharynx and delay in swallow initiation lasting between 1-3 seconds at the level of the pyriform sinuses. Swallow delay/mistiming and pooling of bolus into the pyriforms prior to swallow initiation resulted in trace silent aspiration of 4/5 administered thin liquids  boluses from teaspoon despite trials of bolus hold and chin tuck strategies. Residue in the pharynx improved from previous swallowing assessment. Penetration above the level of the vocal folds which cleared with completion of the swallow was observed with NTLs, however no other penetration/aspiration observed across consistencies. Recommend continuation of Dys1/NTL diet with pills administered whole in puree. Dys1 solids recommended given patient's poor tolerance of more solid POs during bedside assessments of diet tolerance and trials of upgraded textures. SLP will continue to endorse use of water protocol via Provale for hydration with rules posted in room for nursing staff. Ensure patient is only utilizing Provale cup between meals and not alongside them. Factors that may increase risk  Prior to this Study: Dysphagia 1 (pureed); Mildly thick liquids (Level 2, nectar thick)   Temperature : Normal   Respiratory Status: WFL   Supplemental O2: None (Room air)   History of Recent Intubation: Yes  Behavior/Cognition: Alert; Cooperative; Requires cueing; Pleasant mood Self-Feeding Abilities: Able to self-feed Baseline vocal quality/speech: Normal Volitional Cough: Able to elicit Volitional Swallow: Able to elicit Exam Limitations: No limitations Goal Planning: Prognosis for improved oropharyngeal function: Guarded Barriers to  Reach Goals: Language deficits; Cognitive deficits Barriers/Prognosis Comment: severity of deficits Patient/Family Stated Goal: none stated Consulted and agree with results and recommendations: Patient Pain: Pain Assessment Pain Assessment: No/denies pain Faces Pain Scale: 0 End of Session: Start Time:No data recorded Stop Time: No data recorded Time Calculation:No data recorded Charges: No data recorded SLP visit diagnosis: SLP Visit Diagnosis: Dysphagia, oropharyngeal phase (R13.12) Past Medical History: Past Medical History: Diagnosis Date  Alcohol abuse   Atrial fibrillation (HCC)   CHF (congestive heart failure) (HCC)   CKD (chronic kidney disease) stage 3, GFR 30-59 ml/min (HCC)   Hyperlipidemia   Hypertension   Noncompliance  Past Surgical History: Past Surgical History: Procedure Laterality Date  INGUINAL HERNIA REPAIR Right 07/17/2022  Procedure: HERNIA REPAIR INGUINAL INCARCERATED;  Surgeon: Lucretia Roers, MD;  Location: [facility];  Service: General;  Laterality: Right;  IR CT HEAD LTD  04/21/2023  IR PERCUTANEOUS ART THROMBECTOMY/INFUSION INTRACRANIAL INC DIAG ANGIO  04/21/2023  RADIOLOGY WITH ANESTHESIA N/A 04/21/2023  Procedure: IR WITH ANESTHESIA;  Surgeon: Radiologist, Medication, MD;  Location: [facility];  Service: Radiology;  Laterality: N/A;  XI ROBOTIC ASSISTED INGUINAL HERNIA REPAIR WITH MESH Left 03/19/2023  Procedure: XI ROBOTIC ASSISTED INGUINAL HERNIA REPAIR WITH MESH;  Surgeon: Franky Macho, MD;  Location: [facility];  Service: General;  Laterality: Left; [name], M.A., CCC-SLP [name] 05/13/2023, 10:10 AM  DG Swallowing Func-Speech Pathology  Result Date: 05/04/2023 Table formatting from the original result was not included. Modified Barium Swallow Study Patient Details Name: Timothy [name] MRN: [id number] Date of Birth: 07/01/1956 Today's Date: 05/04/2023 HPI/PMH: HPI: Patient is 67 y.o. male who presented with acute aphasia and R facial droop on 9/18 and found to have L MCA  occlusion. Pt s/p revascularization of the of the superior branch of the right MCA with mechanical thrombectomy achieving TICI 2C revascularization. PMH: ETOH abuse, CHF, CKD, HTN. MBS completed 04/26/23 revealed: "oropharyngeal dysphagia includ(ing) timing, motor, and sensory components. He has reduced lingual control with disorganized posterior transit although minimal oral residue. There is anterior loss out of the R side of his mouth though. He has reduced hyolaryngeal movement and laryngeal vestibule closure, and when swallow is mistimed, he penetrates during the swallow across consistencies. Penetration also occurred occasionally from intermittent vallecular residue. Pt's volitional cough is weak and ineffective at clearing penetrates, which continue to fall over time until aspiration occurs. Aspiration for the most part is silent, although spontaneous coughing is also not effective." Clinical Impression: Clinical Impression: Pt presents with oropharyngeal dysphagia primarily characterized by cognitively reduced oral bolus control resulting in posterior spillage prior to swallow initiation. Delay in swallow initiation lasts between 1-3 seconds with liquids boluses and 8-10 seconds with regular solids. With cup sips of thin liquids, swallow initiation is at the level of the pyriform sinuses with bolus material entering the laryngeal vestibule and dipping below the vocal folds (PAS 6) x2, then spontaneously ejected from the subglottal space with completion of the swallow (no cough observed). Once ejected from subglottal space,  gallbladder wall thickening, or biliary dilatation. Pancreas: Unremarkable. No pancreatic ductal dilatation or surrounding inflammatory changes. Spleen: No splenic injury or perisplenic hematoma. Adrenals/Urinary Tract: No adrenal masses. Stable bilateral simple Bosniak 1 renal cysts requiring no follow-up. No hydronephrosis or renal calculi. Stomach/Bowel: Feeding tube enters the stomach and terminates in the region of the antrum of the stomach. No hiatal hernia. The stomach is normally positioned and there is no interposition of colon or liver between the body of the stomach and the abdominal wall. No evidence of bowel obstruction or significant ileus. There is some barium in the colon, presumably related to recent modified barium swallow studies. No free intraperitoneal air. Vascular/Lymphatic: Atherosclerosis of the abdominal aorta without aneurysm. No lymphadenopathy identified. Other: No hernias, ascites or focal fluid collections.  No anasarca. Musculoskeletal: No acute or significant osseous findings. IMPRESSION: 1. Feeding tube enters the stomach and terminates in the region of the antrum of the stomach. The stomach is normally positioned and there is no interposition of colon or liver between the body of the stomach and the abdominal wall. There is no anatomic contraindication to attempted percutaneous gastrostomy tube placement. 2. Coronary  atherosclerosis. 3. Aortic atherosclerosis. Aortic Atherosclerosis (ICD10-I70.0). Electronically Signed   By: Irish Lack M.D.   On: 05/13/2023 13:43   DG Swallowing Func-Speech Pathology  Result Date: 05/13/2023 Table formatting from the original result was not included. Modified Barium Swallow Study Patient Details Name: Timothy [name] MRN: [id number] Date of Birth: 1956-05-16 Today's Date: 05/13/2023 HPI/PMH: HPI: Patient is 67 y.o. 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 Pt s/p revascularization of the of the superior branch of the right MCA with mechanical thrombectomy achieving TICI 2C revascularization. PMH: ETOH abuse, CHF, CKD, HTN. MBS completed 04/26/23 revealed: "oropharyngeal dysphagia includ(ing) timing, motor, and sensory components. He has reduced lingual control with disorganized posterior transit although minimal oral residue. There is anterior loss out of the R side of his mouth though. He has reduced hyolaryngeal movement and laryngeal vestibule closure, and when swallow is mistimed, he penetrates during the swallow across consistencies. Penetration also occurred occasionally from intermittent vallecular residue. Pt's volitional cough is weak and ineffective at clearing penetrates, which continue to fall over time until aspiration occurs. Aspiration for the most part is silent, although spontaneous coughing is also not effective." Repeated MBS 05/04/23 with subsequent upgrade to Dys1/NTL diet. Clinical Impression: Clinical Impression: Pt presents with oropharyngeal dysphagia primarily characterized by reduced oral bolus hold resulting in posterior spillage into the pharynx and delay in swallow initiation lasting between 1-3 seconds at the level of the pyriform sinuses. Swallow delay/mistiming and pooling of bolus into the pyriforms prior to swallow initiation resulted in trace silent aspiration of 4/5 administered thin liquids  boluses from teaspoon despite trials of bolus hold and chin tuck strategies. Residue in the pharynx improved from previous swallowing assessment. Penetration above the level of the vocal folds which cleared with completion of the swallow was observed with NTLs, however no other penetration/aspiration observed across consistencies. Recommend continuation of Dys1/NTL diet with pills administered whole in puree. Dys1 solids recommended given patient's poor tolerance of more solid POs during bedside assessments of diet tolerance and trials of upgraded textures. SLP will continue to endorse use of water protocol via Provale for hydration with rules posted in room for nursing staff. Ensure patient is only utilizing Provale cup between meals and not alongside them. Factors that may increase risk  carotid siphons. Skull: Normal. Negative for fracture or focal lesion. Sinuses/Orbits: The paranasal sinuses are clear. The globes and orbits are unremarkable. Other: The mastoid air cells and middle ear cavities are clear. 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 Electronically Signed   By: Lesia Hausen M.D.   On: 04/22/2023 20:13   ECHOCARDIOGRAM COMPLETE  Result Date: 04/22/2023    ECHOCARDIOGRAM REPORT   Patient Name:   Timothy [name] Date of Exam: 04/22/2023 Medical Rec #:  [id number]         Height:       67.0 in Accession #:    [id number]        Weight:       151.0 lb Date of Birth:  12-01-1955          BSA:          1.795 m Patient Age:    67 years          BP:           151/79 mmHg Patient Gender: M                 HR:           64  bpm. Exam Location:  Inpatient Procedure: 2D Echo, Cardiac Doppler and Color Doppler Indications:    Stroke 434.91 / I163.9  History:        Patient has prior history of Echocardiogram examinations, most                 recent 11/24/2021. CHF, Arrythmias:Atrial Fibrillation; Risk                 Factors:Dyslipidemia and Hypertension.  Sonographer:    Harriette Bouillon RDCS Referring Phys: MCNEILL P KIRKPATRICK IMPRESSIONS  1. No significant LVOT gradient. Left ventricular ejection fraction, by estimation, is 60 to 65%. The left ventricle has normal function. The left ventricle has no regional wall motion abnormalities. There is severe asymmetric left ventricular hypertrophy of the basal-septal segment. Left ventricular diastolic parameters are indeterminate.  2. Right ventricular systolic function is normal. The right ventricular size is normal. Mildly increased right ventricular wall thickness. Tricuspid regurgitation signal is inadequate for assessing PA pressure.  3. Left atrial size was severely dilated.  4. The mitral valve is grossly normal. Mild mitral valve regurgitation. No evidence of mitral stenosis.  5. The aortic valve was not well visualized. There is mild calcification of the aortic valve. There is moderate thickening of the aortic valve. Aortic valve regurgitation is not visualized. No aortic stenosis is present.  6. The inferior vena cava is normal in size with greater than 50% respiratory variability, suggesting right atrial pressure of 3 mmHg. Comparison(s): Prior images reviewed side by side. Normal function with significant biventricular hypertrophy. Conclusion(s)/Recommendation(s): Consider outpatient evaluation for infiltratitve disease if history of long standing high blood pressure and kidney disease. FINDINGS  Left Ventricle: No significant LVOT gradient. Left ventricular ejection fraction, by estimation, is 60 to 65%. The left ventricle has normal function. The left ventricle has no regional  wall motion abnormalities. The left ventricular internal cavity size was normal in size. There is severe asymmetric left ventricular hypertrophy of the basal-septal segment. Left ventricular diastolic parameters are indeterminate. Right Ventricle: The right ventricular size is normal. Mildly increased right ventricular  administered: Puree Puree: WFL Penetration/Aspiration Scale Score: Penetration/Aspiration Scale Score 1.  Material does not enter airway: Moderately thick liquids (Level 3, honey thick); Puree; Solid; Pill 2.  Material enters airway, remains ABOVE vocal cords then ejected out: Mildly thick liquids (Level 2, nectar thick) 8.  Material enters airway, passes BELOW cords without attempt by patient to eject out (silent aspiration) : Thin liquids (Level 0) Compensatory Strategies: Compensatory Strategies Compensatory strategies: No   General Information: Caregiver present: No  Diet Prior to this Study: NPO   Temperature : Normal   Respiratory Status: WFL   Supplemental O2: None (Room air)   History of Recent Intubation: Yes  Behavior/Cognition: Alert; Cooperative; Requires cueing; Pleasant mood Self-Feeding Abilities: Able to self-feed Baseline vocal quality/speech: Normal No data recorded Volitional Swallow: Able to elicit (weak) Exam Limitations: No limitations Goal Planning: Prognosis for improved oropharyngeal function: Good Barriers to Reach Goals: Language deficits; Cognitive deficits No data recorded Patient/Family Stated Goal: none stated Consulted and agree with results and recommendations: Patient Pain: Pain Assessment Pain Assessment: No/denies pain End of Session: Start Time:No data recorded Stop Time: No data recorded Time Calculation:No data recorded Charges: No data recorded SLP visit diagnosis: SLP Visit Diagnosis: Dysphagia, oropharyngeal phase (R13.12) Past Medical History: Past Medical History: Diagnosis Date  Alcohol abuse   Atrial fibrillation (HCC)   CHF (congestive heart failure) (HCC)   CKD (chronic kidney disease) stage 3, GFR 30-59 ml/min (HCC)   Hyperlipidemia    Hypertension   Noncompliance  Past Surgical History: Past Surgical History: Procedure Laterality Date  INGUINAL HERNIA REPAIR Right 07/17/2022  Procedure: HERNIA REPAIR INGUINAL INCARCERATED;  Surgeon: Lucretia Roers, MD;  Location: [facility];  Service: General;  Laterality: Right;  IR CT HEAD LTD  04/21/2023  IR PERCUTANEOUS ART THROMBECTOMY/INFUSION INTRACRANIAL INC DIAG ANGIO  04/21/2023  RADIOLOGY WITH ANESTHESIA N/A 04/21/2023  Procedure: IR WITH ANESTHESIA;  Surgeon: Radiologist, Medication, MD;  Location: [facility];  Service: Radiology;  Laterality: N/A;  XI ROBOTIC ASSISTED INGUINAL HERNIA REPAIR WITH MESH Left 03/19/2023  Procedure: XI ROBOTIC ASSISTED INGUINAL HERNIA REPAIR WITH MESH;  Surgeon: Franky Macho, MD;  Location: [facility];  Service: General;  Laterality: Left; [name], M.A., CCC-SLP [name] 05/04/2023, 12:07 PM  DG Abd 1 View  Result Date: 04/29/2023 CLINICAL DATA:  Encounter for feeding tube EXAM: ABDOMEN - 1 VIEW COMPARISON:  Abdominal x-ray 04/23/2023 FINDINGS: Feeding tube tip is in the fundus of the stomach. Bowel-gas pattern is nonobstructive. Contrast is seen throughout the colon. Radiopaque foreign body is noted soft tissues of the right hip, unchanged. IMPRESSION: Feeding tube tip is in the fundus of the stomach. Electronically Signed   By: Darliss Cheney M.D.   On: 04/29/2023 23:39   DG Swallowing Func-Speech Pathology  Result Date: 04/26/2023 Table formatting from the original result was not included. Modified Barium Swallow Study Patient Details Name: Timothy [name] MRN: [id number] Date of Birth: Mar 10, 1956 Today's Date: 04/26/2023 HPI/PMH: HPI: Patient is 67 y.o. 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 Pt s/p revascularization of the of the superior branch of the right MCA with mechanical thrombectomy achieving TICI 2C revascularization. PMH: ETOH abuse, CHF, CKD, HTN. Clinical Impression: Clinical Impression: Pt's  oropharyngeal dysphagia includes timing, motor, and sensory components. He has reduced lingual control with disorganized posterior transit although minimal oral residue. There is anterior loss out of the R side of his mouth though. He has  Prior to this Study: Dysphagia 1 (pureed); Mildly thick liquids (Level 2, nectar thick)   Temperature : Normal   Respiratory Status: WFL   Supplemental O2: None (Room air)   History of Recent Intubation: Yes  Behavior/Cognition: Alert; Cooperative; Requires cueing; Pleasant mood Self-Feeding Abilities: Able to self-feed Baseline vocal quality/speech: Normal Volitional Cough: Able to elicit Volitional Swallow: Able to elicit Exam Limitations: No limitations Goal Planning: Prognosis for improved oropharyngeal function: Guarded Barriers to  Reach Goals: Language deficits; Cognitive deficits Barriers/Prognosis Comment: severity of deficits Patient/Family Stated Goal: none stated Consulted and agree with results and recommendations: Patient Pain: Pain Assessment Pain Assessment: No/denies pain Faces Pain Scale: 0 End of Session: Start Time:No data recorded Stop Time: No data recorded Time Calculation:No data recorded Charges: No data recorded SLP visit diagnosis: SLP Visit Diagnosis: Dysphagia, oropharyngeal phase (R13.12) Past Medical History: Past Medical History: Diagnosis Date  Alcohol abuse   Atrial fibrillation (HCC)   CHF (congestive heart failure) (HCC)   CKD (chronic kidney disease) stage 3, GFR 30-59 ml/min (HCC)   Hyperlipidemia   Hypertension   Noncompliance  Past Surgical History: Past Surgical History: Procedure Laterality Date  INGUINAL HERNIA REPAIR Right 07/17/2022  Procedure: HERNIA REPAIR INGUINAL INCARCERATED;  Surgeon: Lucretia Roers, MD;  Location: [facility];  Service: General;  Laterality: Right;  IR CT HEAD LTD  04/21/2023  IR PERCUTANEOUS ART THROMBECTOMY/INFUSION INTRACRANIAL INC DIAG ANGIO  04/21/2023  RADIOLOGY WITH ANESTHESIA N/A 04/21/2023  Procedure: IR WITH ANESTHESIA;  Surgeon: Radiologist, Medication, MD;  Location: [facility];  Service: Radiology;  Laterality: N/A;  XI ROBOTIC ASSISTED INGUINAL HERNIA REPAIR WITH MESH Left 03/19/2023  Procedure: XI ROBOTIC ASSISTED INGUINAL HERNIA REPAIR WITH MESH;  Surgeon: Franky Macho, MD;  Location: [facility];  Service: General;  Laterality: Left; [name], M.A., CCC-SLP [name] 05/13/2023, 10:10 AM  DG Swallowing Func-Speech Pathology  Result Date: 05/04/2023 Table formatting from the original result was not included. Modified Barium Swallow Study Patient Details Name: Timothy [name] MRN: [id number] Date of Birth: 07/01/1956 Today's Date: 05/04/2023 HPI/PMH: HPI: Patient is 67 y.o. male who presented with acute aphasia and R facial droop on 9/18 and found to have L MCA  occlusion. Pt s/p revascularization of the of the superior branch of the right MCA with mechanical thrombectomy achieving TICI 2C revascularization. PMH: ETOH abuse, CHF, CKD, HTN. MBS completed 04/26/23 revealed: "oropharyngeal dysphagia includ(ing) timing, motor, and sensory components. He has reduced lingual control with disorganized posterior transit although minimal oral residue. There is anterior loss out of the R side of his mouth though. He has reduced hyolaryngeal movement and laryngeal vestibule closure, and when swallow is mistimed, he penetrates during the swallow across consistencies. Penetration also occurred occasionally from intermittent vallecular residue. Pt's volitional cough is weak and ineffective at clearing penetrates, which continue to fall over time until aspiration occurs. Aspiration for the most part is silent, although spontaneous coughing is also not effective." Clinical Impression: Clinical Impression: Pt presents with oropharyngeal dysphagia primarily characterized by cognitively reduced oral bolus control resulting in posterior spillage prior to swallow initiation. Delay in swallow initiation lasts between 1-3 seconds with liquids boluses and 8-10 seconds with regular solids. With cup sips of thin liquids, swallow initiation is at the level of the pyriform sinuses with bolus material entering the laryngeal vestibule and dipping below the vocal folds (PAS 6) x2, then spontaneously ejected from the subglottal space with completion of the swallow (no cough observed). Once ejected from subglottal space,  Prior to this Study: Dysphagia 1 (pureed); Mildly thick liquids (Level 2, nectar thick)   Temperature : Normal   Respiratory Status: WFL   Supplemental O2: None (Room air)   History of Recent Intubation: Yes  Behavior/Cognition: Alert; Cooperative; Requires cueing; Pleasant mood Self-Feeding Abilities: Able to self-feed Baseline vocal quality/speech: Normal Volitional Cough: Able to elicit Volitional Swallow: Able to elicit Exam Limitations: No limitations Goal Planning: Prognosis for improved oropharyngeal function: Guarded Barriers to  Reach Goals: Language deficits; Cognitive deficits Barriers/Prognosis Comment: severity of deficits Patient/Family Stated Goal: none stated Consulted and agree with results and recommendations: Patient Pain: Pain Assessment Pain Assessment: No/denies pain Faces Pain Scale: 0 End of Session: Start Time:No data recorded Stop Time: No data recorded Time Calculation:No data recorded Charges: No data recorded SLP visit diagnosis: SLP Visit Diagnosis: Dysphagia, oropharyngeal phase (R13.12) Past Medical History: Past Medical History: Diagnosis Date  Alcohol abuse   Atrial fibrillation (HCC)   CHF (congestive heart failure) (HCC)   CKD (chronic kidney disease) stage 3, GFR 30-59 ml/min (HCC)   Hyperlipidemia   Hypertension   Noncompliance  Past Surgical History: Past Surgical History: Procedure Laterality Date  INGUINAL HERNIA REPAIR Right 07/17/2022  Procedure: HERNIA REPAIR INGUINAL INCARCERATED;  Surgeon: Lucretia Roers, MD;  Location: [facility];  Service: General;  Laterality: Right;  IR CT HEAD LTD  04/21/2023  IR PERCUTANEOUS ART THROMBECTOMY/INFUSION INTRACRANIAL INC DIAG ANGIO  04/21/2023  RADIOLOGY WITH ANESTHESIA N/A 04/21/2023  Procedure: IR WITH ANESTHESIA;  Surgeon: Radiologist, Medication, MD;  Location: [facility];  Service: Radiology;  Laterality: N/A;  XI ROBOTIC ASSISTED INGUINAL HERNIA REPAIR WITH MESH Left 03/19/2023  Procedure: XI ROBOTIC ASSISTED INGUINAL HERNIA REPAIR WITH MESH;  Surgeon: Franky Macho, MD;  Location: [facility];  Service: General;  Laterality: Left; [name], M.A., CCC-SLP [name] 05/13/2023, 10:10 AM  DG Swallowing Func-Speech Pathology  Result Date: 05/04/2023 Table formatting from the original result was not included. Modified Barium Swallow Study Patient Details Name: Timothy [name] MRN: [id number] Date of Birth: 07/01/1956 Today's Date: 05/04/2023 HPI/PMH: HPI: Patient is 67 y.o. male who presented with acute aphasia and R facial droop on 9/18 and found to have L MCA  occlusion. Pt s/p revascularization of the of the superior branch of the right MCA with mechanical thrombectomy achieving TICI 2C revascularization. PMH: ETOH abuse, CHF, CKD, HTN. MBS completed 04/26/23 revealed: "oropharyngeal dysphagia includ(ing) timing, motor, and sensory components. He has reduced lingual control with disorganized posterior transit although minimal oral residue. There is anterior loss out of the R side of his mouth though. He has reduced hyolaryngeal movement and laryngeal vestibule closure, and when swallow is mistimed, he penetrates during the swallow across consistencies. Penetration also occurred occasionally from intermittent vallecular residue. Pt's volitional cough is weak and ineffective at clearing penetrates, which continue to fall over time until aspiration occurs. Aspiration for the most part is silent, although spontaneous coughing is also not effective." Clinical Impression: Clinical Impression: Pt presents with oropharyngeal dysphagia primarily characterized by cognitively reduced oral bolus control resulting in posterior spillage prior to swallow initiation. Delay in swallow initiation lasts between 1-3 seconds with liquids boluses and 8-10 seconds with regular solids. With cup sips of thin liquids, swallow initiation is at the level of the pyriform sinuses with bolus material entering the laryngeal vestibule and dipping below the vocal folds (PAS 6) x2, then spontaneously ejected from the subglottal space with completion of the swallow (no cough observed). Once ejected from subglottal space,  gallbladder wall thickening, or biliary dilatation. Pancreas: Unremarkable. No pancreatic ductal dilatation or surrounding inflammatory changes. Spleen: No splenic injury or perisplenic hematoma. Adrenals/Urinary Tract: No adrenal masses. Stable bilateral simple Bosniak 1 renal cysts requiring no follow-up. No hydronephrosis or renal calculi. Stomach/Bowel: Feeding tube enters the stomach and terminates in the region of the antrum of the stomach. No hiatal hernia. The stomach is normally positioned and there is no interposition of colon or liver between the body of the stomach and the abdominal wall. No evidence of bowel obstruction or significant ileus. There is some barium in the colon, presumably related to recent modified barium swallow studies. No free intraperitoneal air. Vascular/Lymphatic: Atherosclerosis of the abdominal aorta without aneurysm. No lymphadenopathy identified. Other: No hernias, ascites or focal fluid collections.  No anasarca. Musculoskeletal: No acute or significant osseous findings. IMPRESSION: 1. Feeding tube enters the stomach and terminates in the region of the antrum of the stomach. The stomach is normally positioned and there is no interposition of colon or liver between the body of the stomach and the abdominal wall. There is no anatomic contraindication to attempted percutaneous gastrostomy tube placement. 2. Coronary  atherosclerosis. 3. Aortic atherosclerosis. Aortic Atherosclerosis (ICD10-I70.0). Electronically Signed   By: Irish Lack M.D.   On: 05/13/2023 13:43   DG Swallowing Func-Speech Pathology  Result Date: 05/13/2023 Table formatting from the original result was not included. Modified Barium Swallow Study Patient Details Name: Timothy [name] MRN: [id number] Date of Birth: 1956-05-16 Today's Date: 05/13/2023 HPI/PMH: HPI: Patient is 67 y.o. 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 Pt s/p revascularization of the of the superior branch of the right MCA with mechanical thrombectomy achieving TICI 2C revascularization. PMH: ETOH abuse, CHF, CKD, HTN. MBS completed 04/26/23 revealed: "oropharyngeal dysphagia includ(ing) timing, motor, and sensory components. He has reduced lingual control with disorganized posterior transit although minimal oral residue. There is anterior loss out of the R side of his mouth though. He has reduced hyolaryngeal movement and laryngeal vestibule closure, and when swallow is mistimed, he penetrates during the swallow across consistencies. Penetration also occurred occasionally from intermittent vallecular residue. Pt's volitional cough is weak and ineffective at clearing penetrates, which continue to fall over time until aspiration occurs. Aspiration for the most part is silent, although spontaneous coughing is also not effective." Repeated MBS 05/04/23 with subsequent upgrade to Dys1/NTL diet. Clinical Impression: Clinical Impression: Pt presents with oropharyngeal dysphagia primarily characterized by reduced oral bolus hold resulting in posterior spillage into the pharynx and delay in swallow initiation lasting between 1-3 seconds at the level of the pyriform sinuses. Swallow delay/mistiming and pooling of bolus into the pyriforms prior to swallow initiation resulted in trace silent aspiration of 4/5 administered thin liquids  boluses from teaspoon despite trials of bolus hold and chin tuck strategies. Residue in the pharynx improved from previous swallowing assessment. Penetration above the level of the vocal folds which cleared with completion of the swallow was observed with NTLs, however no other penetration/aspiration observed across consistencies. Recommend continuation of Dys1/NTL diet with pills administered whole in puree. Dys1 solids recommended given patient's poor tolerance of more solid POs during bedside assessments of diet tolerance and trials of upgraded textures. SLP will continue to endorse use of water protocol via Provale for hydration with rules posted in room for nursing staff. Ensure patient is only utilizing Provale cup between meals and not alongside them. Factors that may increase risk  administered: Puree Puree: WFL Penetration/Aspiration Scale Score: Penetration/Aspiration Scale Score 1.  Material does not enter airway: Moderately thick liquids (Level 3, honey thick); Puree; Solid; Pill 2.  Material enters airway, remains ABOVE vocal cords then ejected out: Mildly thick liquids (Level 2, nectar thick) 8.  Material enters airway, passes BELOW cords without attempt by patient to eject out (silent aspiration) : Thin liquids (Level 0) Compensatory Strategies: Compensatory Strategies Compensatory strategies: No   General Information: Caregiver present: No  Diet Prior to this Study: NPO   Temperature : Normal   Respiratory Status: WFL   Supplemental O2: None (Room air)   History of Recent Intubation: Yes  Behavior/Cognition: Alert; Cooperative; Requires cueing; Pleasant mood Self-Feeding Abilities: Able to self-feed Baseline vocal quality/speech: Normal No data recorded Volitional Swallow: Able to elicit (weak) Exam Limitations: No limitations Goal Planning: Prognosis for improved oropharyngeal function: Good Barriers to Reach Goals: Language deficits; Cognitive deficits No data recorded Patient/Family Stated Goal: none stated Consulted and agree with results and recommendations: Patient Pain: Pain Assessment Pain Assessment: No/denies pain End of Session: Start Time:No data recorded Stop Time: No data recorded Time Calculation:No data recorded Charges: No data recorded SLP visit diagnosis: SLP Visit Diagnosis: Dysphagia, oropharyngeal phase (R13.12) Past Medical History: Past Medical History: Diagnosis Date  Alcohol abuse   Atrial fibrillation (HCC)   CHF (congestive heart failure) (HCC)   CKD (chronic kidney disease) stage 3, GFR 30-59 ml/min (HCC)   Hyperlipidemia    Hypertension   Noncompliance  Past Surgical History: Past Surgical History: Procedure Laterality Date  INGUINAL HERNIA REPAIR Right 07/17/2022  Procedure: HERNIA REPAIR INGUINAL INCARCERATED;  Surgeon: Lucretia Roers, MD;  Location: [facility];  Service: General;  Laterality: Right;  IR CT HEAD LTD  04/21/2023  IR PERCUTANEOUS ART THROMBECTOMY/INFUSION INTRACRANIAL INC DIAG ANGIO  04/21/2023  RADIOLOGY WITH ANESTHESIA N/A 04/21/2023  Procedure: IR WITH ANESTHESIA;  Surgeon: Radiologist, Medication, MD;  Location: [facility];  Service: Radiology;  Laterality: N/A;  XI ROBOTIC ASSISTED INGUINAL HERNIA REPAIR WITH MESH Left 03/19/2023  Procedure: XI ROBOTIC ASSISTED INGUINAL HERNIA REPAIR WITH MESH;  Surgeon: Franky Macho, MD;  Location: [facility];  Service: General;  Laterality: Left; [name], M.A., CCC-SLP [name] 05/04/2023, 12:07 PM  DG Abd 1 View  Result Date: 04/29/2023 CLINICAL DATA:  Encounter for feeding tube EXAM: ABDOMEN - 1 VIEW COMPARISON:  Abdominal x-ray 04/23/2023 FINDINGS: Feeding tube tip is in the fundus of the stomach. Bowel-gas pattern is nonobstructive. Contrast is seen throughout the colon. Radiopaque foreign body is noted soft tissues of the right hip, unchanged. IMPRESSION: Feeding tube tip is in the fundus of the stomach. Electronically Signed   By: Darliss Cheney M.D.   On: 04/29/2023 23:39   DG Swallowing Func-Speech Pathology  Result Date: 04/26/2023 Table formatting from the original result was not included. Modified Barium Swallow Study Patient Details Name: Timothy [name] MRN: [id number] Date of Birth: Mar 10, 1956 Today's Date: 04/26/2023 HPI/PMH: HPI: Patient is 67 y.o. 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 Pt s/p revascularization of the of the superior branch of the right MCA with mechanical thrombectomy achieving TICI 2C revascularization. PMH: ETOH abuse, CHF, CKD, HTN. Clinical Impression: Clinical Impression: Pt's  oropharyngeal dysphagia includes timing, motor, and sensory components. He has reduced lingual control with disorganized posterior transit although minimal oral residue. There is anterior loss out of the R side of his mouth though. He has  gallbladder wall thickening, or biliary dilatation. Pancreas: Unremarkable. No pancreatic ductal dilatation or surrounding inflammatory changes. Spleen: No splenic injury or perisplenic hematoma. Adrenals/Urinary Tract: No adrenal masses. Stable bilateral simple Bosniak 1 renal cysts requiring no follow-up. No hydronephrosis or renal calculi. Stomach/Bowel: Feeding tube enters the stomach and terminates in the region of the antrum of the stomach. No hiatal hernia. The stomach is normally positioned and there is no interposition of colon or liver between the body of the stomach and the abdominal wall. No evidence of bowel obstruction or significant ileus. There is some barium in the colon, presumably related to recent modified barium swallow studies. No free intraperitoneal air. Vascular/Lymphatic: Atherosclerosis of the abdominal aorta without aneurysm. No lymphadenopathy identified. Other: No hernias, ascites or focal fluid collections.  No anasarca. Musculoskeletal: No acute or significant osseous findings. IMPRESSION: 1. Feeding tube enters the stomach and terminates in the region of the antrum of the stomach. The stomach is normally positioned and there is no interposition of colon or liver between the body of the stomach and the abdominal wall. There is no anatomic contraindication to attempted percutaneous gastrostomy tube placement. 2. Coronary  atherosclerosis. 3. Aortic atherosclerosis. Aortic Atherosclerosis (ICD10-I70.0). Electronically Signed   By: Irish Lack M.D.   On: 05/13/2023 13:43   DG Swallowing Func-Speech Pathology  Result Date: 05/13/2023 Table formatting from the original result was not included. Modified Barium Swallow Study Patient Details Name: Timothy [name] MRN: [id number] Date of Birth: 1956-05-16 Today's Date: 05/13/2023 HPI/PMH: HPI: Patient is 67 y.o. 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 Pt s/p revascularization of the of the superior branch of the right MCA with mechanical thrombectomy achieving TICI 2C revascularization. PMH: ETOH abuse, CHF, CKD, HTN. MBS completed 04/26/23 revealed: "oropharyngeal dysphagia includ(ing) timing, motor, and sensory components. He has reduced lingual control with disorganized posterior transit although minimal oral residue. There is anterior loss out of the R side of his mouth though. He has reduced hyolaryngeal movement and laryngeal vestibule closure, and when swallow is mistimed, he penetrates during the swallow across consistencies. Penetration also occurred occasionally from intermittent vallecular residue. Pt's volitional cough is weak and ineffective at clearing penetrates, which continue to fall over time until aspiration occurs. Aspiration for the most part is silent, although spontaneous coughing is also not effective." Repeated MBS 05/04/23 with subsequent upgrade to Dys1/NTL diet. Clinical Impression: Clinical Impression: Pt presents with oropharyngeal dysphagia primarily characterized by reduced oral bolus hold resulting in posterior spillage into the pharynx and delay in swallow initiation lasting between 1-3 seconds at the level of the pyriform sinuses. Swallow delay/mistiming and pooling of bolus into the pyriforms prior to swallow initiation resulted in trace silent aspiration of 4/5 administered thin liquids  boluses from teaspoon despite trials of bolus hold and chin tuck strategies. Residue in the pharynx improved from previous swallowing assessment. Penetration above the level of the vocal folds which cleared with completion of the swallow was observed with NTLs, however no other penetration/aspiration observed across consistencies. Recommend continuation of Dys1/NTL diet with pills administered whole in puree. Dys1 solids recommended given patient's poor tolerance of more solid POs during bedside assessments of diet tolerance and trials of upgraded textures. SLP will continue to endorse use of water protocol via Provale for hydration with rules posted in room for nursing staff. Ensure patient is only utilizing Provale cup between meals and not alongside them. Factors that may increase risk  carotid siphons. Skull: Normal. Negative for fracture or focal lesion. Sinuses/Orbits: The paranasal sinuses are clear. The globes and orbits are unremarkable. Other: The mastoid air cells and middle ear cavities are clear. 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 Electronically Signed   By: Lesia Hausen M.D.   On: 04/22/2023 20:13   ECHOCARDIOGRAM COMPLETE  Result Date: 04/22/2023    ECHOCARDIOGRAM REPORT   Patient Name:   Timothy [name] Date of Exam: 04/22/2023 Medical Rec #:  [id number]         Height:       67.0 in Accession #:    [id number]        Weight:       151.0 lb Date of Birth:  12-01-1955          BSA:          1.795 m Patient Age:    67 years          BP:           151/79 mmHg Patient Gender: M                 HR:           64  bpm. Exam Location:  Inpatient Procedure: 2D Echo, Cardiac Doppler and Color Doppler Indications:    Stroke 434.91 / I163.9  History:        Patient has prior history of Echocardiogram examinations, most                 recent 11/24/2021. CHF, Arrythmias:Atrial Fibrillation; Risk                 Factors:Dyslipidemia and Hypertension.  Sonographer:    Harriette Bouillon RDCS Referring Phys: MCNEILL P KIRKPATRICK IMPRESSIONS  1. No significant LVOT gradient. Left ventricular ejection fraction, by estimation, is 60 to 65%. The left ventricle has normal function. The left ventricle has no regional wall motion abnormalities. There is severe asymmetric left ventricular hypertrophy of the basal-septal segment. Left ventricular diastolic parameters are indeterminate.  2. Right ventricular systolic function is normal. The right ventricular size is normal. Mildly increased right ventricular wall thickness. Tricuspid regurgitation signal is inadequate for assessing PA pressure.  3. Left atrial size was severely dilated.  4. The mitral valve is grossly normal. Mild mitral valve regurgitation. No evidence of mitral stenosis.  5. The aortic valve was not well visualized. There is mild calcification of the aortic valve. There is moderate thickening of the aortic valve. Aortic valve regurgitation is not visualized. No aortic stenosis is present.  6. The inferior vena cava is normal in size with greater than 50% respiratory variability, suggesting right atrial pressure of 3 mmHg. Comparison(s): Prior images reviewed side by side. Normal function with significant biventricular hypertrophy. Conclusion(s)/Recommendation(s): Consider outpatient evaluation for infiltratitve disease if history of long standing high blood pressure and kidney disease. FINDINGS  Left Ventricle: No significant LVOT gradient. Left ventricular ejection fraction, by estimation, is 60 to 65%. The left ventricle has normal function. The left ventricle has no regional  wall motion abnormalities. The left ventricular internal cavity size was normal in size. There is severe asymmetric left ventricular hypertrophy of the basal-septal segment. Left ventricular diastolic parameters are indeterminate. Right Ventricle: The right ventricular size is normal. Mildly increased right ventricular  Prior to this Study: Dysphagia 1 (pureed); Mildly thick liquids (Level 2, nectar thick)   Temperature : Normal   Respiratory Status: WFL   Supplemental O2: None (Room air)   History of Recent Intubation: Yes  Behavior/Cognition: Alert; Cooperative; Requires cueing; Pleasant mood Self-Feeding Abilities: Able to self-feed Baseline vocal quality/speech: Normal Volitional Cough: Able to elicit Volitional Swallow: Able to elicit Exam Limitations: No limitations Goal Planning: Prognosis for improved oropharyngeal function: Guarded Barriers to  Reach Goals: Language deficits; Cognitive deficits Barriers/Prognosis Comment: severity of deficits Patient/Family Stated Goal: none stated Consulted and agree with results and recommendations: Patient Pain: Pain Assessment Pain Assessment: No/denies pain Faces Pain Scale: 0 End of Session: Start Time:No data recorded Stop Time: No data recorded Time Calculation:No data recorded Charges: No data recorded SLP visit diagnosis: SLP Visit Diagnosis: Dysphagia, oropharyngeal phase (R13.12) Past Medical History: Past Medical History: Diagnosis Date  Alcohol abuse   Atrial fibrillation (HCC)   CHF (congestive heart failure) (HCC)   CKD (chronic kidney disease) stage 3, GFR 30-59 ml/min (HCC)   Hyperlipidemia   Hypertension   Noncompliance  Past Surgical History: Past Surgical History: Procedure Laterality Date  INGUINAL HERNIA REPAIR Right 07/17/2022  Procedure: HERNIA REPAIR INGUINAL INCARCERATED;  Surgeon: Lucretia Roers, MD;  Location: [facility];  Service: General;  Laterality: Right;  IR CT HEAD LTD  04/21/2023  IR PERCUTANEOUS ART THROMBECTOMY/INFUSION INTRACRANIAL INC DIAG ANGIO  04/21/2023  RADIOLOGY WITH ANESTHESIA N/A 04/21/2023  Procedure: IR WITH ANESTHESIA;  Surgeon: Radiologist, Medication, MD;  Location: [facility];  Service: Radiology;  Laterality: N/A;  XI ROBOTIC ASSISTED INGUINAL HERNIA REPAIR WITH MESH Left 03/19/2023  Procedure: XI ROBOTIC ASSISTED INGUINAL HERNIA REPAIR WITH MESH;  Surgeon: Franky Macho, MD;  Location: [facility];  Service: General;  Laterality: Left; [name], M.A., CCC-SLP [name] 05/13/2023, 10:10 AM  DG Swallowing Func-Speech Pathology  Result Date: 05/04/2023 Table formatting from the original result was not included. Modified Barium Swallow Study Patient Details Name: Timothy [name] MRN: [id number] Date of Birth: 07/01/1956 Today's Date: 05/04/2023 HPI/PMH: HPI: Patient is 67 y.o. male who presented with acute aphasia and R facial droop on 9/18 and found to have L MCA  occlusion. Pt s/p revascularization of the of the superior branch of the right MCA with mechanical thrombectomy achieving TICI 2C revascularization. PMH: ETOH abuse, CHF, CKD, HTN. MBS completed 04/26/23 revealed: "oropharyngeal dysphagia includ(ing) timing, motor, and sensory components. He has reduced lingual control with disorganized posterior transit although minimal oral residue. There is anterior loss out of the R side of his mouth though. He has reduced hyolaryngeal movement and laryngeal vestibule closure, and when swallow is mistimed, he penetrates during the swallow across consistencies. Penetration also occurred occasionally from intermittent vallecular residue. Pt's volitional cough is weak and ineffective at clearing penetrates, which continue to fall over time until aspiration occurs. Aspiration for the most part is silent, although spontaneous coughing is also not effective." Clinical Impression: Clinical Impression: Pt presents with oropharyngeal dysphagia primarily characterized by cognitively reduced oral bolus control resulting in posterior spillage prior to swallow initiation. Delay in swallow initiation lasts between 1-3 seconds with liquids boluses and 8-10 seconds with regular solids. With cup sips of thin liquids, swallow initiation is at the level of the pyriform sinuses with bolus material entering the laryngeal vestibule and dipping below the vocal folds (PAS 6) x2, then spontaneously ejected from the subglottal space with completion of the swallow (no cough observed). Once ejected from subglottal space,  carotid siphons. Skull: Normal. Negative for fracture or focal lesion. Sinuses/Orbits: The paranasal sinuses are clear. The globes and orbits are unremarkable. Other: The mastoid air cells and middle ear cavities are clear. 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 Electronically Signed   By: Lesia Hausen M.D.   On: 04/22/2023 20:13   ECHOCARDIOGRAM COMPLETE  Result Date: 04/22/2023    ECHOCARDIOGRAM REPORT   Patient Name:   Timothy [name] Date of Exam: 04/22/2023 Medical Rec #:  [id number]         Height:       67.0 in Accession #:    [id number]        Weight:       151.0 lb Date of Birth:  12-01-1955          BSA:          1.795 m Patient Age:    67 years          BP:           151/79 mmHg Patient Gender: M                 HR:           64  bpm. Exam Location:  Inpatient Procedure: 2D Echo, Cardiac Doppler and Color Doppler Indications:    Stroke 434.91 / I163.9  History:        Patient has prior history of Echocardiogram examinations, most                 recent 11/24/2021. CHF, Arrythmias:Atrial Fibrillation; Risk                 Factors:Dyslipidemia and Hypertension.  Sonographer:    Harriette Bouillon RDCS Referring Phys: MCNEILL P KIRKPATRICK IMPRESSIONS  1. No significant LVOT gradient. Left ventricular ejection fraction, by estimation, is 60 to 65%. The left ventricle has normal function. The left ventricle has no regional wall motion abnormalities. There is severe asymmetric left ventricular hypertrophy of the basal-septal segment. Left ventricular diastolic parameters are indeterminate.  2. Right ventricular systolic function is normal. The right ventricular size is normal. Mildly increased right ventricular wall thickness. Tricuspid regurgitation signal is inadequate for assessing PA pressure.  3. Left atrial size was severely dilated.  4. The mitral valve is grossly normal. Mild mitral valve regurgitation. No evidence of mitral stenosis.  5. The aortic valve was not well visualized. There is mild calcification of the aortic valve. There is moderate thickening of the aortic valve. Aortic valve regurgitation is not visualized. No aortic stenosis is present.  6. The inferior vena cava is normal in size with greater than 50% respiratory variability, suggesting right atrial pressure of 3 mmHg. Comparison(s): Prior images reviewed side by side. Normal function with significant biventricular hypertrophy. Conclusion(s)/Recommendation(s): Consider outpatient evaluation for infiltratitve disease if history of long standing high blood pressure and kidney disease. FINDINGS  Left Ventricle: No significant LVOT gradient. Left ventricular ejection fraction, by estimation, is 60 to 65%. The left ventricle has normal function. The left ventricle has no regional  wall motion abnormalities. The left ventricular internal cavity size was normal in size. There is severe asymmetric left ventricular hypertrophy of the basal-septal segment. Left ventricular diastolic parameters are indeterminate. Right Ventricle: The right ventricular size is normal. Mildly increased right ventricular

## 2023-05-14 NOTE — Progress Notes (Addendum)
PROGRESS NOTE   Subjective/Complaints:   Pt reports wondering what plan is- when surgery is for PEG- wife said pt messed up with R hand and ended up spilling urinal in bed last night- ~ 3am.  R hand just having difficulties with fine motor skills.  Hasn't been able to extend R 5th digit "chronically".   LBM yesterday- still incontinent.    ROS: limited by aphasia Objective:   CT ABDOMEN WO CONTRAST  Result Date: 05/13/2023 CLINICAL DATA:  Cerebral infarction, dysphagia, current nasal feeding tube and assessment for possible percutaneous gastrostomy tube placement for chronic nutritional needs. EXAM: CT ABDOMEN WITHOUT CONTRAST TECHNIQUE: Multidetector CT imaging of the abdomen was performed following the standard protocol without IV contrast. RADIATION DOSE REDUCTION: This exam was performed according to the departmental dose-optimization program which includes automated exposure control, adjustment of the mA and/or kV according to patient size and/or use of iterative reconstruction technique. COMPARISON:  CT of the abdomen and pelvis on 07/16/2022 FINDINGS: Lower chest: No acute abnormality. Visible heavily calcified coronary arteries. Hepatobiliary: No focal liver abnormality is seen. No gallstones, gallbladder wall thickening, or biliary dilatation. Pancreas: Unremarkable. No pancreatic ductal dilatation or surrounding inflammatory changes. Spleen: No splenic injury or perisplenic hematoma. Adrenals/Urinary Tract: No adrenal masses. Stable bilateral simple Bosniak 1 renal cysts requiring no follow-up. No hydronephrosis or renal calculi. Stomach/Bowel: Feeding tube enters the stomach and terminates in the region of the antrum of the stomach. No hiatal hernia. The stomach is normally positioned and there is no interposition of colon or liver between the body of the stomach and the abdominal wall. No evidence of bowel obstruction or  significant ileus. There is some barium in the colon, presumably related to recent modified barium swallow studies. No free intraperitoneal air. Vascular/Lymphatic: Atherosclerosis of the abdominal aorta without aneurysm. No lymphadenopathy identified. Other: No hernias, ascites or focal fluid collections.  No anasarca. Musculoskeletal: No acute or significant osseous findings. IMPRESSION: 1. Feeding tube enters the stomach and terminates in the region of the antrum of the stomach. The stomach is normally positioned and there is no interposition of colon or liver between the body of the stomach and the abdominal wall. There is no anatomic contraindication to attempted percutaneous gastrostomy tube placement. 2. Coronary atherosclerosis. 3. Aortic atherosclerosis. Aortic Atherosclerosis (ICD10-I70.0). Electronically Signed   By: Irish Lack M.D.   On: 05/13/2023 13:43   DG Swallowing Func-Speech Pathology  Result Date: 05/13/2023 Table formatting from the original result was not included. Modified Barium Swallow Study Patient Details Name: Timothy [name] MRN: [id number] Date of Birth: 17-Oct-1955 Today's Date: 05/13/2023 HPI/PMH: HPI: Patient is 67 y.o. 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 Pt s/p revascularization of the of the superior branch of the right MCA with mechanical thrombectomy achieving TICI 2C revascularization. PMH: ETOH abuse, CHF, CKD, HTN. MBS completed 04/26/23 revealed: "oropharyngeal dysphagia includ(ing) timing, motor, and sensory components. He has reduced lingual control with disorganized posterior transit although minimal oral residue. There is anterior loss out of the R side of his mouth though. He has reduced hyolaryngeal movement and laryngeal vestibule closure, and  when swallow is mistimed, he penetrates during the swallow across consistencies. Penetration also occurred occasionally from intermittent vallecular residue.  Pt's volitional cough is weak and ineffective at clearing penetrates, which continue to fall over time until aspiration occurs. Aspiration for the most part is silent, although spontaneous coughing is also not effective." Repeated MBS 05/04/23 with subsequent upgrade to Dys1/NTL diet. Clinical Impression: Clinical Impression: Pt presents with oropharyngeal dysphagia primarily characterized by reduced oral bolus hold resulting in posterior spillage into the pharynx and delay in swallow initiation lasting between 1-3 seconds at the level of the pyriform sinuses. Swallow delay/mistiming and pooling of bolus into the pyriforms prior to swallow initiation resulted in trace silent aspiration of 4/5 administered thin liquids boluses from teaspoon despite trials of bolus hold and chin tuck strategies. Residue in the pharynx improved from previous swallowing assessment. Penetration above the level of the vocal folds which cleared with completion of the swallow was observed with NTLs, however no other penetration/aspiration observed across consistencies. Recommend continuation of Dys1/NTL diet with pills administered whole in puree. Dys1 solids recommended given patient's poor tolerance of more solid POs during bedside assessments of diet tolerance and trials of upgraded textures. SLP will continue to endorse use of water protocol via Provale for hydration with rules posted in room for nursing staff. Ensure patient is only utilizing Provale cup between meals and not alongside them. Factors that may increase risk of adverse event in presence of aspiration [name] & [name] 2021): Factors that may increase risk of adverse event in presence of aspiration [name] & [name] 2021): Reduced cognitive function; Weak cough; Presence of tubes (ETT, trach, NG, etc.) Recommendations/Plan: Swallowing Evaluation Recommendations Swallowing Evaluation Recommendations Recommendations: PO diet PO Diet Recommendation: Dysphagia 1 (Pureed);  Mildly thick liquids (Level 2, nectar thick) Liquid Administration via: Cup; Straw Medication Administration: Crushed with puree Supervision: Full supervision/cueing for swallowing strategies; Patient able to self-feed Swallowing strategies  : Minimize environmental distractions; Slow rate; Check for pocketing or oral holding; Small bites/sips Postural changes: Position pt fully upright for meals Oral care recommendations: Oral care QID (4x/day) Caregiver Recommendations: Have oral suction available Treatment Plan Treatment Plan Treatment recommendations: Therapy as outlined in treatment plan below Follow-up recommendations: Acute inpatient rehab (3 hours/day) Functional status assessment: Patient has had a recent decline in their functional status and demonstrates the ability to make significant improvements in function in a reasonable and predictable amount of time. Treatment frequency: Min 2x/week Treatment duration: 2 weeks Interventions: Aspiration precaution training; Compensatory techniques; Patient/family education; Trials of upgraded texture/liquids; Diet toleration management by SLP Recommendations Recommendations for follow up therapy are one component of a multi-disciplinary discharge planning process, led by the attending physician.  Recommendations may be updated based on patient status, additional functional criteria and insurance authorization. Assessment: Orofacial Exam: Orofacial Exam Oral Cavity: Oral Hygiene: WFL Oral Cavity - Dentition: Poor condition; Missing dentition Orofacial Anatomy: WFL Oral Motor/Sensory Function: Suspected cranial nerve impairment CN V - Trigeminal: Right motor impairment CN VII - Facial: Right motor impairment CN IX - Glossopharyngeal, CN X - Vagus: Right motor impairment CN XII - Hypoglossal: Right motor impairment Anatomy: Anatomy: WFL Boluses Administered: Boluses Administered Boluses Administered: Thin liquids (Level 0); Mildly thick liquids (Level 2, nectar thick);  Moderately thick liquids (Level 3, honey thick); Puree  Oral Impairment Domain: Oral Impairment Domain Lip Closure: No labial escape Tongue control during bolus hold: Posterior escape of less than half of bolus Bolus preparation/mastication: -- (not observed) Bolus transport/lingual motion: Repetitive/disorganized tongue motion  Oral residue: Trace residue lining oral structures Location of oral residue : Tongue; Palate; Floor of mouth Initiation of pharyngeal swallow : Pyriform sinuses  Pharyngeal Impairment Domain: Pharyngeal Impairment Domain Soft palate elevation: No bolus between soft palate (SP)/pharyngeal wall (PW) Laryngeal elevation: Partial superior movement of thyroid cartilage/partial approximation of arytenoids to epiglottic petiole Anterior hyoid excursion: Complete anterior movement Epiglottic movement: Complete inversion Laryngeal vestibule closure: Incomplete, narrow column air/contrast in laryngeal vestibule Pharyngeal stripping wave : Present - diminished Pharyngeal contraction (A/P view only): N/A Pharyngoesophageal segment opening: Partial distention/partial duration, partial obstruction of flow Tongue base retraction: Trace column of contrast or air between tongue base and PPW Pharyngeal residue: Collection of residue within or on pharyngeal structures Location of pharyngeal residue: Pyriform sinuses; Aryepiglottic folds  Esophageal Impairment Domain: No data recorded Pill: No data recorded Penetration/Aspiration Scale Score: Penetration/Aspiration Scale Score 1.  Material does not enter airway: Moderately thick liquids (Level 3, honey thick); Puree 2.  Material enters airway, remains ABOVE vocal cords then ejected out: Mildly thick liquids (Level 2, nectar thick) 8.  Material enters airway, passes BELOW cords without attempt by patient to eject out (silent aspiration) : Thin liquids (Level 0) Compensatory Strategies: Compensatory Strategies Compensatory strategies: Yes Chin tuck: Ineffective  Ineffective Chin Tuck: Thin liquid (Level 0) Oral bolus hold: Ineffective Ineffective Oral Bolus Hold : Thin liquid (Level 0)   General Information: Caregiver present: No  Diet Prior to this Study: Dysphagia 1 (pureed); Mildly thick liquids (Level 2, nectar thick)   Temperature : Normal   Respiratory Status: WFL   Supplemental O2: None (Room air)   History of Recent Intubation: Yes  Behavior/Cognition: Alert; Cooperative; Requires cueing; Pleasant mood Self-Feeding Abilities: Able to self-feed Baseline vocal quality/speech: Normal Volitional Cough: Able to elicit Volitional Swallow: Able to elicit Exam Limitations: No limitations Goal Planning: Prognosis for improved oropharyngeal function: Guarded Barriers to Reach Goals: Language deficits; Cognitive deficits Barriers/Prognosis Comment: severity of deficits Patient/Family Stated Goal: none stated Consulted and agree with results and recommendations: Patient Pain: Pain Assessment Pain Assessment: No/denies pain Faces Pain Scale: 0 End of Session: Start Time:No data recorded Stop Time: No data recorded Time Calculation:No data recorded Charges: No data recorded SLP visit diagnosis: SLP Visit Diagnosis: Dysphagia, oropharyngeal phase (R13.12) Past Medical History: Past Medical History: Diagnosis Date  Alcohol abuse   Atrial fibrillation (HCC)   CHF (congestive heart failure) (HCC)   CKD (chronic kidney disease) stage 3, GFR 30-59 ml/min (HCC)   Hyperlipidemia   Hypertension   Noncompliance  Past Surgical History: Past Surgical History: Procedure Laterality Date  INGUINAL HERNIA REPAIR Right 07/17/2022  Procedure: HERNIA REPAIR INGUINAL INCARCERATED;  Surgeon: Lucretia Roers, MD;  Location: [facility];  Service: General;  Laterality: Right;  IR CT HEAD LTD  04/21/2023  IR PERCUTANEOUS ART THROMBECTOMY/INFUSION INTRACRANIAL INC DIAG ANGIO  04/21/2023  RADIOLOGY WITH ANESTHESIA N/A 04/21/2023  Procedure: IR WITH ANESTHESIA;  Surgeon: Radiologist, Medication, MD;  Location:  [facility];  Service: Radiology;  Laterality: N/A;  XI ROBOTIC ASSISTED INGUINAL HERNIA REPAIR WITH MESH Left 03/19/2023  Procedure: XI ROBOTIC ASSISTED INGUINAL HERNIA REPAIR WITH MESH;  Surgeon: Franky Macho, MD;  Location: [facility];  Service: General;  Laterality: Left; [name], M.A., CCC-SLP Garey Ham Ellin 05/13/2023, 10:10 AM  No results for input(s): "WBC", "HGB", "HCT", "PLT" in the last 72 hours.    No results for input(s): "NA", "K", "CL", "CO2", "GLUCOSE", "BUN", "CREATININE", "CALCIUM" in the last 72 hours.   Intake/Output Summary (Last  24 hours) at 05/14/2023 0818 Last data filed at 05/14/2023 0724 Gross per 24 hour  Intake 813 ml  Output 1550 ml  Net -737 ml         Physical Exam: Vital Signs Blood pressure 122/75, pulse (!) 54, temperature 98.4 F (36.9 C), resp. rate 17, height 5\' 7"  (1.702 m), weight 72.1 kg, SpO2 100%.        General: awake, alert, appropriate, sitting up watching TV; wife at bedside; NAD HENT: conjugate gaze; oropharynx moist- cortrak in place CV: regular rhythm, bradycardic rate; no JVD Pulmonary: CTA B/L; no W/R/R- good air movement GI: soft, NT, ND, (+)BS Psychiatric: flat, but slightly more interactive Neurological:aphasic- stable- but slightly more interactive MS_ Cannot extend R 5th digit-  Ext: no clubbing, cyanosis, or edema Psych: flat but cooperative  Neuro: Oriented to person place but not time. Language more fluid and spontaneous. Speech clear. RUE: 4-/5 Deltoid, 4-/5 Biceps, 4-/5 Triceps, 4-/5 Grip--4/5 LUE: 5/5 Deltoid, 5/5 Biceps, 5/5 Triceps,  5/5 Grip RLE: HF 4/5, KE 4+/5, ADF 4+/5, APF 4+/5 LLE: HF 5/5, KE 5/5, ADF 5/5, APF 5/5 Incision intact light touch in all 4 extremities. Neurological exam stable today 05/14/2023 Musculoskeletal: no pain with PROM. Right upper extremity fifth digit contracture-wife reports this is chronic   Assessment/Plan: 1. Functional deficits which require 3+ hours per day of  interdisciplinary therapy in a comprehensive inpatient rehab setting. Physiatrist is providing close team supervision and 24 hour management of active medical problems listed below. Physiatrist and rehab team continue to assess barriers to discharge/monitor patient progress toward functional and medical goals  Care Tool:  Bathing    Body parts bathed by patient: Right arm, Left arm, Chest, Abdomen, Front perineal area, Right upper leg, Left upper leg, Face   Body parts bathed by helper: Left lower leg, Right lower leg, Buttocks     Bathing assist Assist Level: Moderate Assistance - Patient 50 - 74%     Upper Body Dressing/Undressing Upper body dressing   What is the patient wearing?: Pull over shirt    Upper body assist Assist Level: Moderate Assistance - Patient 50 - 74%    Lower Body Dressing/Undressing Lower body dressing      What is the patient wearing?: Pants, Incontinence brief     Lower body assist Assist for lower body dressing: Moderate Assistance - Patient 50 - 74%     Toileting Toileting    Toileting assist Assist for toileting: Maximal Assistance - Patient 25 - 49%     Transfers Chair/bed transfer  Transfers assist     Chair/bed transfer assist level: Contact Guard/Touching assist     Locomotion Ambulation   Ambulation assist      Assist level: Contact Guard/Touching assist Assistive device: No Device Max distance: 150'   Walk 10 feet activity   Assist     Assist level: Contact Guard/Touching assist Assistive device: No Device   Walk 50 feet activity   Assist    Assist level: Contact Guard/Touching assist Assistive device: No Device    Walk 150 feet activity   Assist    Assist level: Contact Guard/Touching assist Assistive device: No Device    Walk 10 feet on uneven surface  activity   Assist     Assist level: Minimal Assistance - Patient > 75%     Wheelchair     Assist Is the patient using a  wheelchair?: Yes Type of Wheelchair: Manual    Wheelchair assist level: Dependent - Patient  0% Max wheelchair distance: 150'    Wheelchair 50 feet with 2 turns activity    Assist        Assist Level: Dependent - Patient 0%   Wheelchair 150 feet activity     Assist      Assist Level: Dependent - Patient 0%   Blood pressure 122/75, pulse (!) 54, temperature 98.4 F (36.9 C), resp. rate 17, height 5\' 7"  (1.702 m), weight 72.1 kg, SpO2 100%.  Medical Problem List and Plan: 1. 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  Etiology suspected to be A-fib not on anticoagulation versus large vessel disease             -patient may shower             -ELOS/Goals: 10 to 14 days, supervision to min assist with PT, OT, SLP          D/c date set for next Wednesday, hopefully- pt unhappy about it.   Con't CIR PT, OT and SLP  Trying to get pt PEG due to fluid intake that cannot do on his own- can eat, but not take fluid to keep up with current needs 2.  Antithrombotics: -DVT/anticoagulation:  Pharmaceutical: Heparin initiated for anticoagulation. PLAN TO TRANSITION TO ELIQUIS 5 MG BID ONCE ABLE TO SWALLOW. 9/28 still NPO 10/1- per pharmacy change change to Eliquis- stopped ASA and Heparin and added Eliquis - but then realized wasn't clear- called pharmacy again- will reach out to Dr Pearlean Brownie and see if need ASA at all? 10/2- ASA stopped per Dr Pearlean Brownie             -antiplatelet therapy: Aspirin 325 mg daily 3. Pain Management: tylenol as needed  9/30- denies pain- con't regimen 4. Mood/Behavior/Sleep: Provide emotional support             -antipsychotic agents: N/A 5. Neuropsych/cognition: This patient is not capable of making decisions on his own behalf. 6. Skin/Wound Care: Routine skin checks 7. Fluids/Electrolytes/Nutrition: Routine and analysis with follow-up chemistries 8.  Dysphagia.  Currently NPO, Cortrak. Speech  therapy follow-up.  9/27- pulled cortrak accidentally last night- out a few inches- per nursing- KUB looks OK- reviewed it- can use- mittens? Recheck modified barium swallow on Tuesday, October 1  10/1- pending MBSS  10/2- now on D3 nectar thick diet  10/5- Diet remains D1 with nectar thick. Continue TF also  10/6 pt ate 25-50-65% yesterday, will reduce TF to 35cc/hr  10/8- will look into PEG  10/11- spoke to IR_ they will place consult note to let us know plan  -MBSS done- still at D1 nectar thick liquids 9.  Hypertension.  Imdur 60 mg daily.  Monitor with increased mobility.  Long-term blood pressure goal normotensive  9/26- will change Imdur to immediate release- d/w pharmacy- changed  10/11- BP usually controlled- is 150s this AM- however is unusual- will monitor trend Vitals:   05/13/23 1916 05/14/23 0549  BP: (!) 152/74 122/75  Pulse: 62 (!) 54  Resp: 17 17  Temp: 97.9 F (36.6 C) 98.4 F (36.9 C)  SpO2: 99% 100%    10.  Bradycardia into the 40s.  Coreg held.  Avoid negative chronotropic medications.   Monitor with increased mobility  9/30-10/1- doing slightly better- in 50s- not 40's  10/2-10/5 HR in 50s-60's - con't regimen 11.  Hyperlipidemia.  Pravachol 12.  CKD stage IIIa.  Follow-up chemistries  9/30- Cr 1.48- up from  1.30- BUN up to 29- will increase Free water to 250 cc q4 - don't want to increase further due to [facility]-- and see how goes in labs tomorrow-   10/1- BUN up to 34 and Cr 1.54 up from 1.48- will consult nutritionist  for free water changes-   10/2- spoke with nutritonist- will change TF's to night time only- maintain fluids, but decrease protein- also adding supplements if doesn't eat enough- per pt/wife eating really well- will check BMP in AM  10/3- Pt's food downgraded to D1 diet- also BUN up to 42 up from 34 and Cr stable at 1.55- will give low dose IVFs to try and help turn this Around- will do 75cc for 12 hours 5pm to 5am- and recheck in AM with  BMP  10/4- pending labs this AM- pt doesn't have labs drawn yet- pending  10/5 improved BUN/Cr yesterday--will increase free water thru tube today   10/6 pt ate better yesterday   -cut nocturnal TF in half   -continue increased H20 flushes   -labs tomorrow  10/7- BUN/Cr stable- however on water flushes as well- explained to pt will need to take double the amount of Nectar thick fluids, if remove Cortrak. Will d/w SLP and dietitian  10/8- still getting 300cc free water and I think pt needs PEG_ esp since they downgraded diet to D1 nectar thick again this AM- will d/w pt  10/9- placed order for PEG placement by IR- will see when they think they can do.   10/10- checking with IR about plan- what do we need to do with Eliquis/ do we need Heparin gtt or tx dose lovenox or what so can get PEG placed  10/11- Nursing held Eliquis this AM- trying to determine plan for Eliquis- was told IR wanted it held 2 days, but not sure what to put on until PEG, since was on heparin gtt prior.     Latest Ref Rng & Units 05/11/2023    5:20 AM 05/10/2023    6:12 AM 05/07/2023    8:03 AM  BMP  Glucose 70 - 99 mg/dL 811  914  782   BUN 8 - 23 mg/dL 34  34  33   Creatinine 0.61 - 1.24 mg/dL 9.56  2.13  0.86   Sodium 135 - 145 mmol/L 136  134  134   Potassium 3.5 - 5.1 mmol/L 4.8  4.4  4.5   Chloride 98 - 111 mmol/L 100  98  100   CO2 22 - 32 mmol/L 28  25  26    Calcium 8.9 - 10.3 mg/dL 9.1  9.0  9.2     13.  Diastolic congestive heart failure.  Monitor for any signs of fluid overload. 9/28- stable   9/30- weight stable- 10/1- no weight today- will d/w nursing   10/2- weight up slightly- will monitor for trend  10/3- 71.3 kg today- up 1 lb from yesterday-if goes above 73 kg, will give Lasix  10/5- Weight 71.2 kg this AM- stable  10/11- weight stable- con't to monitor Filed Weights   05/12/23 0500 05/13/23 0513 05/14/23 0549  Weight: 71.7 kg 72.1 kg 72.1 kg    14.  History of tobacco/alcohol use/Cocaine.   Provide counseling 15.  Medical noncompliance.  Counseling 16.  Atrial fibrillation.  Hold Coreg due to bradycardia.  Continue ASA and heparin until able to swallow and can switch to Eliquis  10/1- changed to Eliquis per Pharmacy- can do- can be crushed- stopped ASA  and heparin 10/11- trying to figure out plan for Eliquis/PEG.    17. Constipation  9/26- will give sorbitol after therapy today  9/27- no results with sorbitol- will give another Sorbitol but 60cc and then soap suds enema if no results  10/1- cannot tell when having BM- has no control per wife- last went yesterday  10/4- LBM yesterday, but still has no control- we discussed doing suppository/bowel program- pt refused for now , but said he'd "think about it".   10/6- no bm since 10/3--add miralax and senokot-s at HS.   10/8-10/9 LBM yesterday- still having incontinence- pt doesn't want suppository-   10/10- LBM yesterday- still having incontinence- doesn't want bowel program  10/11- going daily per pt/wife- but cannot control it.  18. Hyperkalemia  9/26- will recheck in AM- is 5.3- however was 4.1- so hesitant to give Lokelma without rechecking 9/27 - K+ 4.2- so likely hemolysis yesterday  10/6 k+ 4.5 10/4 19. Diabetes- on Tfs  9/26- will add SSI, since A1c 6.9 and on Tf's- do q4 hours per protocol.   9/30-10/1 CBGs running well- might need to change if gets diet with MBSS today  10/2- change to TID with meals- and monitor  10/3-10/5 CBGs reasonable control--no changes  10/11   CBG (last 3)  Recent Labs    05/13/23 1155 05/13/23 1629 05/14/23 0551  GLUCAP 126* 107* 152*     I spent a total of  39  minutes on total care today- >50% coordination of care- due to  D/w nursing about holding Eliquis-for PEG- CT reviewed independently,  it appears to me appropriate for IR PEG, however waiting for IT consult note. Also spoke with pharmacy and Interventional radiology about Eliquis, timing- -   Spoke to IR- will hold Eliquis  starting tomorrow- and start Heparin gtt until morning of procedure  D/w pahrmacy again.  LOS: 16 days A FACE TO FACE EVALUATION WAS PERFORMED  Tyquarius Paglia 05/14/2023, 8:18 AM

## 2023-05-14 NOTE — Progress Notes (Signed)
Physical Therapy Session Note  Patient Details  Name: Timothy [name] MRN: [id number] Date of Birth: 1956-03-16  Today's Date: 05/14/2023 PT Individual Time: 0804-0900 PT Individual Time Calculation (min): 56 min   Short Term Goals: Week 3:  PT Short Term Goal 1 (Week 3): STG's=LTG's due to ELOS  Skilled Therapeutic Interventions/Progress Updates: Patient long sitting in bed with Chiropodist of Nursing and wife present on entrance to room. Patient alert and agreeable to PT session.   Patient reported no pain at beginning of PT session.  Therapeutic Activity: Bed Mobility: Pt performed self care (posterior/anterior pericare) with wash cloth (no incontinence, just freshing up with supervision). PTA donned new brief for time management with pt donning personal pants and shirt with supervision in bed (finished donning shirt EOB and pants standing).  Transfers: Pt performed sit<>stand transfers throughout session with supervision for safety and no AD.   Gait Training:  Pt ambulated ambulated room<main gym using no AD with CGA for safety. Pt demonstrated the following gait deviations with therapist providing the described cuing and facilitation for improvement:  - decreased step clearance bilaterally - pt with decreased attention to R side with presentation of occasionally bumping into loose hanging paper on R side of hallway on wall, or lightly grazing objects on wall with pt acknowledging deficit.  At end of session, pt ambulated main gym<room with no AD and close supervision in middle of hallway with no LOB issue noted.  Neuromuscular Re-ed: NMR facilitated during session with focus on proprioceptive feedback in B LE's, balance and coordination. Obstacle Course  - Pt laterally stepping to the R on airex beam with demonstrative cues to knock over bowling pins with L LE  - Pt then stepping over 6" hurdles with VC to step up close and center to hurdle vs slightly away and to the R (pt  provided with questioning cues to correct orientation to hurdle with pt figuring it out). Pt also required VC to step over via hip flexion vs abducting R LE to the side of hurdle  - Pt then goes back down course for end of 1st round  - Progressed to bowling pins further away to increase balance challenge, and height of hurdles to 9".  - Pt with minA to maintain balance while on airex beam, and CGA while stepping over hurdles on 1st round and VC to correct balance when LOB starts to occur via hip, ankle, stepping strategies (demonstration provided). Pt required max cues to perform correction.   - Pt initiated PTA cleaning up airex beam and Dycem off floor with CGA using R UE (PTA had to roll edge of Dycem to assist pt with gripping). Pt then ambulated carrying Dycem and airex beam and placed them in their respective places with CGA for safety.   NMR performed for improvements in motor control and coordination, balance, sequencing, judgement, and self confidence/ efficacy in performing all aspects of mobility at highest level of independence.   Patient long sitting in bed with wife and friend present at end of session. Pt with brakes locked, bed alarm set, and all needs within reach.      Therapy Documentation Precautions:  Precautions Precautions: Fall Precaution Comments: R inattention; SBP <180, cortrak Restrictions Weight Bearing Restrictions: No  Therapy/Group: Individual Therapy  Davontae Prusinski PTA 05/14/2023, 10:37 AM

## 2023-05-14 NOTE — Progress Notes (Signed)
PHARMACY - ANTICOAGULATION CONSULT NOTE  Pharmacy Consult for Heparin to begin on 10/12 am (Eliquis to be held after pm dose) Indication: atrial fibrillation and stroke  No Known Allergies  Patient Measurements: Height: 5\' 7"  (170.2 cm) Weight: 72.1 kg (158 lb 15.2 oz) IBW/kg (Calculated) : 66.1 Heparin Dosing Weight: 72 kg  Vital Signs: Temp: 98.8 F (37.1 C) (10/11 1257) BP: 114/70 (10/11 1257) Pulse Rate: 60 (10/11 1257)  Last labs 10/7: - Hemoglobin 14.0, platelet count 142, stable - creatinine 1.52; 1.3-1.55 since admitted  Estimated Creatinine Clearance: 42.9 mL/min (A) (by C-G formula based on SCr of 1.52 mg/dL (H)).  Assessment: 67 yr old male admitted 04/21/23 CVA.s/p thrombectomy on 9/18. Hx of AF but not on anticoagulation prior to admit. Pharmacy dosed IV heparin 9/23>>10/1 when transitioned to Eliquis 5 mg PO BID.  Now needs PEG, and Eliquis to be held after tonight's dose.  IV heparin to begin 10/12 am, about 12 hours after tonight's Eliquis dose.  Will need to use aPTTs for heparin monitoring, while heparin levels are expected to be falsely elevated due to recent Eliquis doses.  Will target low therapeutic levels.  Goal of Therapy:  Heparin level 0.3-0.5 units/ml aPTT 66-85 seconds Monitor platelets by anticoagulation protocol: Yes   Plan:  Hold Eliquis after 10/11 8pm dose. Heparin to begin ~8am on 10/12 at 1100 units/hr aPTT, heparin level and CBC ~8 hours after heparin begins. Daily aPTT, heparin level and CBC while on heparin. Heparin to be held prior to PEG on 10/14.  Dennie Fetters, RPh 05/14/2023,3:47 PM

## 2023-05-14 NOTE — Progress Notes (Signed)
Occupational Therapy Session Note  Patient Details  Name: Timothy [name] MRN: [id number] Date of Birth: [birth year]  Today's Date: 05/14/2023 OT Individual Time: 8413-2440 OT Individual Time Calculation (min): 72 min    Short Term Goals: Week 2:  OT Short Term Goal 1 (Week 2): STG=LTG d/t ELOS  Skilled Therapeutic Interventions/Progress Updates:    Pt greeted seated in wc and agreeable to OT treatment session. Pt declined to participate in BADL tasks. Pt ambulated to therapy gym with supervision. Addressed R attention and functional use of R UE with bead finding activity in soft yellow theraputty. Pt needed cues to only use hie R hand initially, but then able to keep using R hand without cues. Increased time and effort to locate 10 beads. Facilitation to decrease compensatory shoulder movement and cues for pincer grasp. Diffficulty with pincer grasp, with more of a lateral pinch to try to collect beads. OT had pt make a "snake" with putty. OT facilitated pinch and worked on pinch to finger tips and to decrease the lateral pinch he has been using. Utilized larger pegs to work on Materials engineer with OT handing pt the pegs. Pt was able to open hand to accept the peg, but still had a hard time opposing thumb to first finger. UB there ex and standing balance standing on foam block and completing 3 sets of 10 chest press, bicep curl, and straight arm raise with 3 lb weighted bar. Pt ambulated back to room and left seated in wc with alarm on, call bell in reach, and needs met.   Therapy Documentation Precautions:  Precautions Precautions: Fall Precaution Comments: R inattention; SBP <180, cortrak Restrictions Weight Bearing Restrictions: No Pain: Pain Assessment Pain Scale: 0-10 Pain Score: 0-No pain   Therapy/Group: Individual Therapy  Mal Amabile 05/14/2023, 1:28 PM

## 2023-05-14 NOTE — Progress Notes (Signed)
Speech Language Pathology Daily Session Note  Patient Details  Name: Timothy [name] MRN: [id number] Date of Birth: 1955-12-15  Today's Date: 05/14/2023 SLP Individual Time: 0900-1000 SLP Individual Time Calculation (min): 60 min  Short Term Goals: Week 3: SLP Short Term Goal 1 (Week 3): STGs=LTGs d/t ELOS  Skilled Therapeutic Interventions: SLP conducted skilled therapy session targeting dysphagia management and communication goals. Patient upright in bed upon SLP arrival, agreeable to participate in speech therapy session. Patient receiving visitor at beginning of session, though visitor agreeable to provide SLP with time for therapy. SLP guided patient through various communication tasks. During writing task, patient required max-total verbal cues to write down name and month. Patient oriented to date when given visual aid and field of 2 spoken choices. During naming task, patient benefited from modA overall to name linedrawn items and finished common phrases with appropriate responses given modA. During session, RN entered to provide patient with medications crushed in puree and thickened ensure. Across all boluses, no overt difficulty nor clinical s/sx of penetration/aspiration observed. Recommend continuation of current diet. Patient was left in lowered bed with call bell in reach and bed alarm set. SLP will continue to target goals per plan of care.      Pain Pain Assessment Pain Scale: 0-10 Pain Score: 0-No pain  Therapy/Group: Individual Therapy  [name], M.A., CCC-SLP  [name] 05/14/2023, 10:00 AM

## 2023-05-15 DIAGNOSIS — I5030 Unspecified diastolic (congestive) heart failure: Secondary | ICD-10-CM

## 2023-05-15 DIAGNOSIS — R159 Full incontinence of feces: Secondary | ICD-10-CM

## 2023-05-15 LAB — BASIC METABOLIC PANEL
Anion gap: 9 (ref 5–15)
BUN: 42 mg/dL — ABNORMAL HIGH (ref 8–23)
CO2: 26 mmol/L (ref 22–32)
Calcium: 9.1 mg/dL (ref 8.9–10.3)
Chloride: 104 mmol/L (ref 98–111)
Creatinine, Ser: 1.67 mg/dL — ABNORMAL HIGH (ref 0.61–1.24)
GFR, Estimated: 44 mL/min — ABNORMAL LOW (ref >60)
Glucose, Bld: 98 mg/dL (ref 70–99)
Potassium: 4.6 mmol/L (ref 3.5–5.1)
Sodium: 139 mmol/L (ref 135–145)

## 2023-05-15 LAB — CBC
HCT: 38.1 % — ABNORMAL LOW (ref 39.0–52.0)
Hemoglobin: 12.4 g/dL — ABNORMAL LOW (ref 13.0–17.0)
MCH: 27.2 pg (ref 26.0–34.0)
MCHC: 32.5 g/dL (ref 30.0–36.0)
MCV: 83.6 fL (ref 80.0–100.0)
Platelets: 117 10*3/uL — ABNORMAL LOW (ref 150–400)
RBC: 4.56 MIL/uL (ref 4.22–5.81)
RDW: 13.8 % (ref 11.5–15.5)
WBC: 5.1 10*3/uL (ref 4.0–10.5)
nRBC: 0 % (ref 0.0–0.2)

## 2023-05-15 LAB — APTT: aPTT: 70 s — ABNORMAL HIGH (ref 24–36)

## 2023-05-15 LAB — GLUCOSE, CAPILLARY
Glucose-Capillary: 125 mg/dL — ABNORMAL HIGH (ref 70–99)
Glucose-Capillary: 129 mg/dL — ABNORMAL HIGH (ref 70–99)
Glucose-Capillary: 112 mg/dL — ABNORMAL HIGH (ref 70–99)
Glucose-Capillary: 144 mg/dL — ABNORMAL HIGH (ref 70–99)

## 2023-05-15 LAB — HEPARIN LEVEL (UNFRACTIONATED): Heparin Unfractionated: >1.1 [IU]/mL — ABNORMAL HIGH (ref 0.30–0.70)

## 2023-05-15 NOTE — Progress Notes (Signed)
PROGRESS NOTE   Subjective/Complaints:   No events overnight. No acute complaints. Vitals stable. BG 120-150s.  Last BM 10/11, incontinent, large.  Per IR yesterday: "Patient on Eliquis for a fib. Discussed with team.  Plan to bridge with heparin drip, hold Eliquis through the weekend. Anticipate placement early next week.  Will place NPO p MN order for Monday."   ROS: limited by aphasia Objective:   CT ABDOMEN WO CONTRAST  Result Date: 05/13/2023 CLINICAL DATA:  Cerebral infarction, dysphagia, current nasal feeding tube and assessment for possible percutaneous gastrostomy tube placement for chronic nutritional needs. EXAM: CT ABDOMEN WITHOUT CONTRAST TECHNIQUE: Multidetector CT imaging of the abdomen was performed following the standard protocol without IV contrast. RADIATION DOSE REDUCTION: This exam was performed according to the departmental dose-optimization program which includes automated exposure control, adjustment of the mA and/or kV according to patient size and/or use of iterative reconstruction technique. COMPARISON:  CT of the abdomen and pelvis on 07/16/2022 FINDINGS: Lower chest: No acute abnormality. Visible heavily calcified coronary arteries. Hepatobiliary: No focal liver abnormality is seen. No gallstones, gallbladder wall thickening, or biliary dilatation. Pancreas: Unremarkable. No pancreatic ductal dilatation or surrounding inflammatory changes. Spleen: No splenic injury or perisplenic hematoma. Adrenals/Urinary Tract: No adrenal masses. Stable bilateral simple Bosniak 1 renal cysts requiring no follow-up. No hydronephrosis or renal calculi. Stomach/Bowel: Feeding tube enters the stomach and terminates in the region of the antrum of the stomach. No hiatal hernia. The stomach is normally positioned and there is no interposition of colon or liver between the body of the stomach and the abdominal wall. No evidence of  bowel obstruction or significant ileus. There is some barium in the colon, presumably related to recent modified barium swallow studies. No free intraperitoneal air. Vascular/Lymphatic: Atherosclerosis of the abdominal aorta without aneurysm. No lymphadenopathy identified. Other: No hernias, ascites or focal fluid collections.  No anasarca. Musculoskeletal: No acute or significant osseous findings. IMPRESSION: 1. Feeding tube enters the stomach and terminates in the region of the antrum of the stomach. The stomach is normally positioned and there is no interposition of colon or liver between the body of the stomach and the abdominal wall. There is no anatomic contraindication to attempted percutaneous gastrostomy tube placement. 2. Coronary atherosclerosis. 3. Aortic atherosclerosis. Aortic Atherosclerosis (ICD10-I70.0). Electronically Signed   By: Irish Lack M.D.   On: 05/13/2023 13:43   No results for input(s): "WBC", "HGB", "HCT", "PLT" in the last 72 hours.    No results for input(s): "NA", "K", "CL", "CO2", "GLUCOSE", "BUN", "CREATININE", "CALCIUM" in the last 72 hours.   Intake/Output Summary (Last 24 hours) at 05/15/2023 0959 Last data filed at 05/15/2023 0837 Gross per 24 hour  Intake 658 ml  Output 900 ml  Net -242 ml         Physical Exam: Vital Signs Blood pressure 130/76, pulse (!) 54, temperature 98.3 F (36.8 C), resp. rate 18, height 5\' 7"  (1.702 m), weight 72 kg, SpO2 100%.  General: awake, alert, appropriate, sitting up eating lunch HENT: conjugate gaze; oropharynx moist +cortrak in place CV: regular rhythm, regular rate; no m/r/g. no JVD Pulmonary: CTA B/L; no W/R/R- good air movement GI: soft,  NT, ND, (+)BS Psychiatric: flat, apporpriate mood Neurological:  + Expressive aphasic- moderate intelligibility MSK: Cannot fully extend R 5th digit- otherwise full ROM Ext: no clubbing, cyanosis, or edema  Neuro: Oriented to person place, month but not year Moving  all 4 extremities antigravity and against resistance Prior exam:  RUE: 4-/5 Deltoid, 4-/5 Biceps, 4-/5 Triceps, 4-/5 Grip--4/5 LUE: 5/5 Deltoid, 5/5 Biceps, 5/5 Triceps,  5/5 Grip RLE: HF 4/5, KE 4+/5, ADF 4+/5, APF 4+/5 LLE: HF 5/5, KE 5/5, ADF 5/5, APF 5/5  Assessment/Plan: 1. Functional deficits which require 3+ hours per day of interdisciplinary therapy in a comprehensive inpatient rehab setting. Physiatrist is providing close team supervision and 24 hour management of active medical problems listed below. Physiatrist and rehab team continue to assess barriers to discharge/monitor patient progress toward functional and medical goals  Care Tool:  Bathing    Body parts bathed by patient: Right arm, Left arm, Chest, Abdomen, Front perineal area, Buttocks, Right upper leg, Left upper leg, Right lower leg, Left lower leg, Face   Body parts bathed by helper: Left lower leg, Right lower leg, Buttocks     Bathing assist Assist Level: Minimal Assistance - Patient > 75%     Upper Body Dressing/Undressing Upper body dressing   What is the patient wearing?: Pull over shirt    Upper body assist Assist Level: Minimal Assistance - Patient > 75%    Lower Body Dressing/Undressing Lower body dressing      What is the patient wearing?: Pants, Incontinence brief     Lower body assist Assist for lower body dressing: Moderate Assistance - Patient 50 - 74%     Toileting Toileting    Toileting assist Assist for toileting: Total Assistance - Patient < 25%     Transfers Chair/bed transfer  Transfers assist     Chair/bed transfer assist level: Contact Guard/Touching assist     Locomotion Ambulation   Ambulation assist      Assist level: Contact Guard/Touching assist Assistive device: No Device Max distance: 150'   Walk 10 feet activity   Assist     Assist level: Contact Guard/Touching assist Assistive device: No Device   Walk 50 feet activity   Assist     Assist level: Contact Guard/Touching assist Assistive device: No Device    Walk 150 feet activity   Assist    Assist level: Contact Guard/Touching assist Assistive device: No Device    Walk 10 feet on uneven surface  activity   Assist     Assist level: Minimal Assistance - Patient > 75%     Wheelchair     Assist Is the patient using a wheelchair?: Yes Type of Wheelchair: Manual    Wheelchair assist level: Dependent - Patient 0% Max wheelchair distance: 150'    Wheelchair 50 feet with 2 turns activity    Assist        Assist Level: Dependent - Patient 0%   Wheelchair 150 feet activity     Assist      Assist Level: Dependent - Patient 0%   Blood pressure 130/76, pulse (!) 54, temperature 98.3 F (36.8 C), resp. rate 18, height 5\' 7"  (1.702 m), weight 72 kg, SpO2 100%.  Medical Problem List and Plan: 1. 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  Etiology suspected to be A-fib not on anticoagulation versus large vessel disease             -patient  may shower             -ELOS/Goals: 10 to 14 days, supervision to min assist with PT, OT, SLP             D/c date set for next Wednesday, hopefully- pt unhappy about it.   Con't CIR PT, OT and SLP  - PEG planned 10/14  2.  Antithrombotics: -DVT/anticoagulation:  Pharmaceutical: Heparin initiated for anticoagulation. PLAN TO TRANSITION TO ELIQUIS 5 MG BID ONCE ABLE TO SWALLOW. 9/28 still NPO 10/1- per pharmacy change change to Eliquis- stopped ASA and Heparin and added Eliquis - but then realized wasn't clear- called pharmacy again- will reach out to Dr Pearlean Brownie and see if need ASA at all? 10/2- ASA stopped per Dr Pearlean Brownie 10/11: Eliquis Dced, heparin bridge pending PEG placement Monday-- Heparin supratherapeutic, HGB down from 14->12 and BUN increasing; h/h in AM, FOBT ordered, monitor for apparent s/s blood loss               -antiplatelet therapy: Aspirin 325 mg daily 3. Pain Management: tylenol as needed  9/30- denies pain- con't regimen 4. Mood/Behavior/Sleep: Provide emotional support             -antipsychotic agents: N/A 5. Neuropsych/cognition: This patient is not capable of making decisions on his own behalf. 6. Skin/Wound Care: Routine skin checks 7. Fluids/Electrolytes/Nutrition: Routine and analysis with follow-up chemistries 8.  Dysphagia.  Currently NPO, Cortrak. Speech therapy follow-up.  9/27- pulled cortrak accidentally last night- out a few inches- per nursing- KUB looks OK- reviewed it- can use- mittens? Recheck modified barium swallow on Tuesday, October 1  10/1- pending MBSS  10/2- now on D3 nectar thick diet  10/5- Diet remains D1 with nectar thick. Continue TF also  10/6 pt ate 25-50-65% yesterday, will reduce TF to 35cc/hr  10/8- will look into PEG  10/11- spoke to Ir_plan for PEG Monday  -MBSS done- still at D1 nectar thick liquids  9.  Hypertension.  Imdur 60 mg daily.  Monitor with increased mobility.  Long-term blood pressure goal normotensive  9/26- will change Imdur to immediate release- d/w pharmacy- changed  10/11- BP usually controlled- is 150s this AM- however is unusual- will monitor trend  10/12: Well controlled  Vitals:   05/14/23 2227 05/15/23 0347  BP: 133/86 130/76  Pulse: 60 (!) 54  Resp: 16 18  Temp:  98.3 F (36.8 C)  SpO2: 100% 100%    10.  Bradycardia into the 40s.  Coreg held.  Avoid negative chronotropic medications.   Monitor with increased mobility  9/30-10/1- doing slightly better- in 50s- not 40's  10/2-10/5 HR in 50s-60's - con't regimen - stable 10/12 11.  Hyperlipidemia.  Pravachol 12.  CKD stage IIIa.  Follow-up chemistries  9/30- Cr 1.48- up from 1.30- BUN up to 29- will increase Free water to 250 cc q4 - don't want to increase further due to [facility]-- and see how goes in labs tomorrow-   10/1- BUN up to 34 and Cr 1.54 up from 1.48- will consult  nutritionist  for free water changes-   10/2- spoke with nutritonist- will change TF's to night time only- maintain fluids, but decrease protein- also adding supplements if doesn't eat enough- per pt/wife eating really well- will check BMP in AM  10/3- Pt's food downgraded to D1 diet- also BUN up to 42 up from 34 and Cr stable at 1.55- will give low dose IVFs to try and help turn this Around- will  do 75cc for 12 hours 5pm to 5am- and recheck in AM with BMP  10/4- pending labs this AM- pt doesn't have labs drawn yet- pending  10/5 improved BUN/Cr yesterday--will increase free water thru tube today   10/6 pt ate better yesterday   -cut nocturnal TF in half   -continue increased H20 flushes   -labs tomorrow  10/7- BUN/Cr stable- however on water flushes as well- explained to pt will need to take double the amount of Nectar thick fluids, if remove Cortrak. Will d/w SLP and dietitian  10/8- still getting 300cc free water and I think pt needs PEG_ esp since they downgraded diet to D1 nectar thick again this AM- will d/w pt  10/11: FWF 300 ml Q4H; continue. Will get BMP with labs for heparin this evening --> BUN > Cr uptrending with HgB downtrending; repeat BMP in AM, concerning for GIB     Latest Ref Rng & Units 05/11/2023    5:20 AM 05/10/2023    6:12 AM 05/07/2023    8:03 AM  BMP  Glucose 70 - 99 mg/dL 409  811  914   BUN 8 - 23 mg/dL 34  34  33   Creatinine 0.61 - 1.24 mg/dL 7.82  9.56  2.13   Sodium 135 - 145 mmol/L 136  134  134   Potassium 3.5 - 5.1 mmol/L 4.8  4.4  4.5   Chloride 98 - 111 mmol/L 100  98  100   CO2 22 - 32 mmol/L 28  25  26    Calcium 8.9 - 10.3 mg/dL 9.1  9.0  9.2     13.  Diastolic congestive heart failure.  Monitor for any signs of fluid overload. 9/28- stable   9/30- weight stable- 10/1- no weight today- will d/w nursing   10/2- weight up slightly- will monitor for trend  10/3- 71.3 kg today- up 1 lb from yesterday-if goes above 73 kg, will give Lasix  10/5- Weight  71.2 kg this AM- stable  10/11-12- weight stable- con't to monitor Filed Weights   05/13/23 0513 05/14/23 0549 05/15/23 0500  Weight: 72.1 kg 72.1 kg 72 kg    14.  History of tobacco/alcohol use/Cocaine.  Provide counseling 15.  Medical noncompliance.  Counseling 16.  Atrial fibrillation.  Hold Coreg due to bradycardia.  Continue ASA and heparin --> Eliquis --> 10/11 heparin bridge for PEG   17. Constipation/bowel incontinence  9/26- will give sorbitol after therapy today  9/27- no results with sorbitol- will give another Sorbitol but 60cc and then soap suds enema if no results  10/1- cannot tell when having BM- has no control per wife- last went yesterday  10/4- LBM yesterday, but still has no control- we discussed doing suppository/bowel program- pt refused for now , but said he'd "think about it".   10/6- no bm since 10/3--add miralax and senokot-s at HS.   10/8-10/9 LBM yesterday- still having incontinence- pt doesn't want suppository-   10/10- LBM yesterday- still having incontinence- doesn't want bowel program  10/11- going daily per pt/wife- but cannot control it.   18. Hyperkalemia  9/26- will recheck in AM- is 5.3- however was 4.1- so hesitant to give Lokelma without rechecking 9/27 - K+ 4.2- so likely hemolysis yesterday  10/6 k+ 4.5 10/4 Repeeat 10/12 - 4.6  19. Diabetes- on Tfs  9/26- will add SSI, since A1c 6.9 and on Tf's- do q4 hours per protocol.   9/30-10/1 CBGs running well- might need to change  if gets diet with MBSS today  10/2- change to TID with meals- and monitor  10/3-10/5 CBGs reasonable control--no changes  10/11 - mildly elevated, like d/t tube feeds, continue SSI  CBG (last 3)  Recent Labs    05/14/23 1640 05/14/23 2047 05/15/23 0614  GLUCAP 96 151* 125*    D/w pahrmacy again.  LOS: 17 days A FACE TO FACE EVALUATION WAS PERFORMED  Angelina Sheriff 05/15/2023, 9:59 AM

## 2023-05-15 NOTE — Progress Notes (Signed)
PHARMACY - ANTICOAGULATION CONSULT NOTE  Pharmacy Consult for Heparin (Eliquis held for PEG placement) Indication: atrial fibrillation and stroke  No Known Allergies  Patient Measurements: Height: 5\' 7"  (170.2 cm) Weight: 72 kg (158 lb 11.7 oz) IBW/kg (Calculated) : 66.1 Heparin Dosing Weight: 72 kg  Vital Signs: Temp: 97.7 F (36.5 C) (10/12 1234) BP: 108/70 (10/12 1234) Pulse Rate: 64 (10/12 1234)  Last labs 10/7: - Hemoglobin 14.0, platelet count 142, stable - creatinine 1.52; 1.3-1.55 since admitted  Estimated Creatinine Clearance: 39 mL/min (A) (by C-G formula based on SCr of 1.67 mg/dL (H)).  Assessment: 67 yr old male admitted 04/21/23 with CVA.s/p thrombectomy on 9/18. Hx of AF but not on anticoagulation prior to admit. Pharmacy dosed IV heparin 9/23>>10/1 when transitioned to Eliquis 5 mg PO BID.  Now needs PEG, and Eliquis to be held for procedure (LD 10/11 PM).  IV heparin started 10/12 am, about 12 hours after Eliquis dose.  Will utilize aPTTs for heparin monitoring, while heparin levels are expected to be falsely elevated due to recent Eliquis doses.  Will target low therapeutic levels.  aPTT within low goal on heparin at 1100 units/hr.  Heparin level elevated as expected with recent Eliquis use.  Goal of Therapy:  Heparin level 0.3-0.5 units/ml aPTT 66-85 seconds Monitor platelets by anticoagulation protocol: Yes   Plan:  Continue heparin at 1100 units/hr Daily aPTT, heparin level and CBC while on heparin. Heparin to be held prior to PEG on 10/14.   [employer], Pharm.D., BCPS Clinical Pharmacist  **Pharmacist phone directory can be found on [email or web address] listed under [facility].  05/15/2023 7:53 PM

## 2023-05-15 NOTE — Progress Notes (Signed)
Occupational Therapy Session Note  Patient Details  Name: Timothy [name] MRN: [id number] Date of Birth: [birth year]  Today's Date: 05/15/2023 OT Individual Time: 9562-1308 OT Individual Time Calculation (min): 57 min      Skilled Therapeutic Interventions/Progress Updates: Patient received awake and alert in the bed. Patient with incontinent episode. Able to transfer OOB with close SBA to stand at the sink for bathing. Patient with cuing for thoroughness and to bathe all areas. Patient sat in w/c to wash face, chest and lower legs. Continued with grooming at the sink with cuing and minimal assist for thoroughness. Patient with good standing balance during ADL's. Patient performed LE dressing with Mod assist getting jeans up RLE and to fasten the button. Assist to start slipper sock on the R foot and to get the R hand to grip the top of the sock. Continued exercises with FM reeducation working on coordinated R finger extension for lateral pinch. Increase vc's to achieve finger extenstion prior to each repetition. Continues to report numbness in R hand.  Continue with skilled OT POC to improve independence with self care.      Therapy Documentation Precautions:  Precautions Precautions: Fall Precaution Comments: R inattention; SBP <180, cortrak Restrictions Weight Bearing Restrictions: No General:   Vital Signs:   Pain: Pain Assessment Pain Scale: 0-10 Pain Score: 0-No pain ADL: ADL Eating: NPO Grooming:  (wife has been shaving pt- he is right handed and he has hemiplegia) Upper Body Bathing: Minimal assistance Where Assessed-Upper Body Bathing: Sitting at sink Lower Body Bathing: Moderate assistance Where Assessed-Lower Body Bathing: Sitting at sink Upper Body Dressing: Maximal assistance Where Assessed-Upper Body Dressing: Sitting at sink Lower Body Dressing: Maximal assistance Where Assessed-Lower Body Dressing: Sitting at sink Toileting: Maximal assistance Where  Assessed-Toileting: Actuary Transfer: Minimal assistance    Therapy/Group: Individual Therapy  Warnell Forester 05/15/2023, 12:51 PM

## 2023-05-16 DIAGNOSIS — E119 Type 2 diabetes mellitus without complications: Secondary | ICD-10-CM

## 2023-05-16 DIAGNOSIS — I69320 Aphasia following cerebral infarction: Principal | ICD-10-CM

## 2023-05-16 LAB — BASIC METABOLIC PANEL
Anion gap: 5 (ref 5–15)
BUN: 33 mg/dL — ABNORMAL HIGH (ref 8–23)
CO2: 27 mmol/L (ref 22–32)
Calcium: 8.9 mg/dL (ref 8.9–10.3)
Chloride: 101 mmol/L (ref 98–111)
Creatinine, Ser: 1.21 mg/dL (ref 0.61–1.24)
GFR, Estimated: >60 mL/min (ref >60)
Glucose, Bld: 123 mg/dL — ABNORMAL HIGH (ref 70–99)
Potassium: 4 mmol/L (ref 3.5–5.1)
Sodium: 133 mmol/L — ABNORMAL LOW (ref 135–145)

## 2023-05-16 LAB — CBC
HCT: 36.7 % — ABNORMAL LOW (ref 39.0–52.0)
Hemoglobin: 11.9 g/dL — ABNORMAL LOW (ref 13.0–17.0)
MCH: 27.4 pg (ref 26.0–34.0)
MCHC: 32.4 g/dL (ref 30.0–36.0)
MCV: 84.4 fL (ref 80.0–100.0)
Platelets: 113 10*3/uL — ABNORMAL LOW (ref 150–400)
RBC: 4.35 MIL/uL (ref 4.22–5.81)
RDW: 13.8 % (ref 11.5–15.5)
WBC: 4.5 10*3/uL (ref 4.0–10.5)
nRBC: 0 % (ref 0.0–0.2)

## 2023-05-16 LAB — HEPARIN LEVEL (UNFRACTIONATED): Heparin Unfractionated: 0.92 [IU]/mL — ABNORMAL HIGH (ref 0.30–0.70)

## 2023-05-16 LAB — APTT
aPTT: 41 s — ABNORMAL HIGH (ref 24–36)
aPTT: 86 s — ABNORMAL HIGH (ref 24–36)

## 2023-05-16 LAB — GLUCOSE, CAPILLARY: Glucose-Capillary: 132 mg/dL — ABNORMAL HIGH (ref 70–99)

## 2023-05-16 NOTE — Progress Notes (Signed)
Physical Therapy Session Note  Patient Details  Name: Timothy [name] MRN: [id number] Date of Birth: 12/28/1955  Today's Date: 05/16/2023 PT Individual Time: 1018-1100 PT Individual Time Calculation (min): 42 min   Short Term Goals: Week 3:  PT Short Term Goal 1 (Week 3): STG's=LTG's due to ELOS  Skilled Therapeutic Interventions/Progress Updates:      Therapy Documentation Precautions:  Precautions Precautions: Fall Precaution Comments: R inattention; SBP <180, cortrak Restrictions Weight Bearing Restrictions: No  Pt agreeable to PT session with emphasis on dynamic balance and strength training. Pt declines pain and supervision with ambulation from hospital room <>dayroom without cues for navigation throughout unit.   Pt requires min/mod A for squats 2 x 12 with feet on dyna disc. Pt with increased weight shift to left and decreased R LE activation therefore progressed to strength training in split stance with L LE positioned on 4 inch step. In this set-up pt performed modified right single leg squats x 12, sit<>stands x 12, sit<>stands 2 x 6 with R OH press with 2# weight to challenge coordination.   Pt returned to room and left semi-reclined in bed with all needs in reach and alarm on.    Therapy/Group: Individual Therapy  [name] [name] PT, DPT  05/16/2023, 7:51 AM

## 2023-05-16 NOTE — Progress Notes (Signed)
Speech Language Pathology Daily Session Note  Patient Details  Name: BRAIAN TIJERINA MRN: [id number] Date of Birth: 1955-11-05  Today's Date: 05/16/2023 SLP Individual Time: 1435-1530 SLP Individual Time Calculation (min): 55 min  Short Term Goals: Week 3: SLP Short Term Goal 1 (Week 3): STGs=LTGs d/t ELOS  Skilled Therapeutic Interventions:  Pt was seen for skilled ST targeting goals for dysphagia and communication.  Pt was awake, alert, and agreeable to participating in treatment today.  SLP facilitated the session with trials of water from [place] cup following oral care to continue working towards diet advancement.  Pt demonstrated no overt s/s of aspiration with thin liquids.  SLP also facilitated the session with a picture sequencing task targeting verbal expression.  Pt was able to place 4 step picture cards in order with min cues to recognize and correct sequencing errors but needed mod cues to describe actions in photographs at the short phrase level.  Pt was left in bed with bed alarm set and call bell within reach.  Continue per current plan of care.    Pain Pain Assessment Pain Scale: 0-10 Pain Score: 0-No pain  Therapy/Group: Individual Therapy  Nannette Zill, Melanee Spry 05/16/2023, 3:20 PM

## 2023-05-16 NOTE — Progress Notes (Addendum)
PHARMACY - ANTICOAGULATION CONSULT NOTE  Pharmacy Consult for Heparin to begin on 10/12 am (Eliquis to be held after pm dose) Indication: atrial fibrillation and stroke  No Known Allergies  Patient Measurements: Height: 5\' 7"  (170.2 cm) Weight: 72.3 kg (159 lb 6.6 oz) IBW/kg (Calculated) : 66.1 Heparin Dosing Weight: 72 kg  Vital Signs: Temp: 98.3 F (36.8 C) (10/13 1353) Temp Source: Oral (10/13 1343) BP: 148/81 (10/13 1353) Pulse Rate: 68 (10/13 1353)  Last labs 10/7: - Hemoglobin 14.0, platelet count 142, stable - creatinine 1.52; 1.3-1.55 since admitted  Estimated Creatinine Clearance: 53.9 mL/min (by C-G formula based on SCr of 1.21 mg/dL).  Assessment: 67 yr old male admitted 04/21/23 CVA.s/p thrombectomy on 9/18. Hx of AF but not on anticoagulation prior to admit. Pharmacy dosed IV heparin 9/23>>10/1 when transitioned to Eliquis 5 mg PO BID.  Now needs PEG, and Eliquis to be held after tonight's dose.  IV heparin to begin 10/12 am, about 12 hours after tonight's Eliquis dose.  Will need to use aPTTs for heparin monitoring, while heparin levels are expected to be falsely elevated due to recent Eliquis doses.  Will target low therapeutic levels.  Heparin level falsely elevated. aPTT down to 41 seconds this morning.  RN reports IV found leaking/disconnected this morning > labs were drawn during that time period. New IV site established and infusion resumed ~8am.  aPTT this evening came back slightly supratherapeutic at 86, on heparin infusion at 1100 units/hr. No s/sx of bleeding or infusion issues.   Plan for PEG 10/14: Heparin to be held 4 hours prior to procedure.  Procedure time is unknown, but heparin drip to stop at 4am on 10/14, in case of 8am scheduling. If late afternoon scheduling, drip will be resumed for appropriate duration. Heparin may be resumed 1 hour post-procedure, and Eliquis may resume 24 hrs post-procedure.  Goal of Therapy:  Heparin level 0.3-0.5  units/ml aPTT 66-85 seconds Monitor platelets by anticoagulation protocol: Yes   Plan:  Eliquis on hold for PEG on 10/14. Reduce heparin drip to 1050 units/hr QMonday CBC due 10/14. Heparin to be held at 4am on 10/14 for PEG, but will follow up for timing of procedure since currently unknown. Needs to be held for 4 hours prior to procedure per IR. Then expect heparin to resume 1 hour post-procedure, and Eliquis may resume 24hrs post-procedure.  Thank you for allowing pharmacy to participate in this patient's care,  Sherron Monday, PharmD, BCCCP Clinical Pharmacist  Phone: [phone] 05/16/2023 4:35 PM  Please check AMION for all [facility] Pharmacy phone numbers After 10:00 PM, call Main Pharmacy [phone]

## 2023-05-16 NOTE — Progress Notes (Signed)
PHARMACY - ANTICOAGULATION CONSULT NOTE  Pharmacy Consult for Heparin to begin on 10/12 am (Eliquis to be held after pm dose) Indication: atrial fibrillation and stroke  No Known Allergies  Patient Measurements: Height: 5\' 7"  (170.2 cm) Weight: 72.3 kg (159 lb 6.6 oz) IBW/kg (Calculated) : 66.1 Heparin Dosing Weight: 72 kg  Vital Signs: Temp: 98.8 F (37.1 C) (10/13 0318) BP: 137/76 (10/13 0318) Pulse Rate: 68 (10/13 0318)  Last labs 10/7: - Hemoglobin 14.0, platelet count 142, stable - creatinine 1.52; 1.3-1.55 since admitted  Estimated Creatinine Clearance: 53.9 mL/min (by C-G formula based on SCr of 1.21 mg/dL).  Assessment: 67 yr old male admitted 04/21/23 CVA.s/p thrombectomy on 9/18. Hx of AF but not on anticoagulation prior to admit. Pharmacy dosed IV heparin 9/23>>10/1 when transitioned to Eliquis 5 mg PO BID.  Now needs PEG, and Eliquis to be held after tonight's dose.  IV heparin to begin 10/12 am, about 12 hours after tonight's Eliquis dose.  Will need to use aPTTs for heparin monitoring, while heparin levels are expected to be falsely elevated due to recent Eliquis doses.  Will target low therapeutic levels.  aPTT at goal (70 seconds) last evening on 1100 units/hr. Heparin level falsely elevated. aPTT down to 41 seconds this morning.  RN reports IV found leaking/disconnected this morning > labs were drawn during that time period. New IV site established and infusion resumed ~8am.  Hemoglobin and platelet count trended down. No known bleeding. Plan FOB checks.  AJanee Morn, PA-C with IR contacted Pharmacy on 10/12 for heparin stop time pre-PEG on 10/14. Heparin to be held 4 hours prior to procedure.  Procedure time is unknown, but heparin drip to stop at 4am on 10/14, in case of 8am scheduling. If late afternoon scheduling, drip will be resumed for appropriate duration. Heparin may be resumed 1 hour post-procedure, and Eliquis may resume 24 hrs post-procedure.  Goal of  Therapy:  Heparin level 0.3-0.5 units/ml aPTT 66-85 seconds Monitor platelets by anticoagulation protocol: Yes   Plan:  Eliquis on hold for PEG on 10/14. Continue Heparin drip at 1100 units/hr aPTT ~8 hours after IV site re-established this morning. Daily aPTT and heparin levels cancelled for now QMonday CBC due 10/14. Heparin to be held at 4am on 10/14 for PEG, but will follow up for timing of procedure since currently unknown. Needs to be held for 4 hours prior to procedure per IR. Then expect heparin to resume 1 hour post-procedure, and Eliquis may resume 24hrs post-procedure.  Dennie Fetters, RPh 05/16/2023,10:03 AM

## 2023-05-16 NOTE — Progress Notes (Signed)
PROGRESS NOTE   Subjective/Complaints:   No events overnight. No acute complaints.  Patient denies any questions regarding procedure tomorrow. Vitals stable. Labs much improved, renal fxn better, BG <200s, heparin level decreased, HGB only mildly decreased this AM.  Per pharmacy, heparin level elevation expected as eliquis metabolizes; no adjustments to rate of heparin gtt.  He endorses having a bowel movement yesterday, denies any melena or gross blood.  ROS: limited by aphasia; negative for fever, chills, shortness of breath, chest pain. Objective:   No results found. Recent Labs    05/15/23 1853 05/16/23 0720  WBC 5.1 4.5  HGB 12.4* 11.9*  HCT 38.1* 36.7*  PLT 117* 113*      Recent Labs    05/15/23 1645 05/16/23 0720  NA 139 133*  K 4.6 4.0  CL 104 101  CO2 26 27  GLUCOSE 98 123*  BUN 42* 33*  CREATININE 1.67* 1.21  CALCIUM 9.1 8.9     Intake/Output Summary (Last 24 hours) at 05/16/2023 0959 Last data filed at 05/16/2023 0811 Gross per 24 hour  Intake 1886.56 ml  Output 500 ml  Net 1386.56 ml         Physical Exam: Vital Signs Blood pressure 137/76, pulse 68, temperature 98.8 F (37.1 C), resp. rate 17, height 5\' 7"  (1.702 m), weight 72.3 kg, SpO2 100%.  General: awake, alert, appropriate, lying in bed. HENT: conjugate gaze; oropharynx moist +cortrak in place CV: regular rhythm, regular rate; no m/r/g. no JVD Pulmonary: CTA B/L; no W/R/R- good air movement GI: soft, NT, ND, (+)BS Psychiatric: flat, apporpriate mood MSK: Cannot fully extend R 5th digit- otherwise full ROM  Neuro: Oriented to person, not oriented to place with options, oriented to month but not year Moving all 4 extremities antigravity and against resistance in bed Prior exam:  RUE: 4-/5 Deltoid, 4-/5 Biceps, 4-/5 Triceps, 4-/5 Grip--4/5 LUE: 5/5 Deltoid, 5/5 Biceps, 5/5 Triceps,  5/5 Grip RLE: HF 4/5, KE 4+/5, ADF  4+/5, APF 4+/5 LLE: HF 5/5, KE 5/5, ADF 5/5, APF 5/5  Assessment/Plan: 1. Functional deficits which require 3+ hours per day of interdisciplinary therapy in a comprehensive inpatient rehab setting. Physiatrist is providing close team supervision and 24 hour management of active medical problems listed below. Physiatrist and rehab team continue to assess barriers to discharge/monitor patient progress toward functional and medical goals  Care Tool:  Bathing    Body parts bathed by patient: Right arm, Left arm, Chest, Abdomen, Front perineal area, Buttocks, Right upper leg, Left upper leg, Right lower leg, Left lower leg, Face   Body parts bathed by helper: Left lower leg, Right lower leg, Buttocks     Bathing assist Assist Level: Minimal Assistance - Patient > 75%     Upper Body Dressing/Undressing Upper body dressing   What is the patient wearing?: Pull over shirt    Upper body assist Assist Level: Minimal Assistance - Patient > 75%    Lower Body Dressing/Undressing Lower body dressing      What is the patient wearing?: Pants, Incontinence brief     Lower body assist Assist for lower body dressing: Moderate Assistance - Patient 50 - 74%  Toileting Toileting    Toileting assist Assist for toileting: Total Assistance - Patient < 25%     Transfers Chair/bed transfer  Transfers assist     Chair/bed transfer assist level: Contact Guard/Touching assist     Locomotion Ambulation   Ambulation assist      Assist level: Contact Guard/Touching assist Assistive device: No Device Max distance: 150'   Walk 10 feet activity   Assist     Assist level: Contact Guard/Touching assist Assistive device: No Device   Walk 50 feet activity   Assist    Assist level: Contact Guard/Touching assist Assistive device: No Device    Walk 150 feet activity   Assist    Assist level: Contact Guard/Touching assist Assistive device: No Device    Walk 10 feet on  uneven surface  activity   Assist     Assist level: Minimal Assistance - Patient > 75%     Wheelchair     Assist Is the patient using a wheelchair?: Yes Type of Wheelchair: Manual    Wheelchair assist level: Dependent - Patient 0% Max wheelchair distance: 150'    Wheelchair 50 feet with 2 turns activity    Assist        Assist Level: Dependent - Patient 0%   Wheelchair 150 feet activity     Assist      Assist Level: Dependent - Patient 0%   Blood pressure 137/76, pulse 68, temperature 98.8 F (37.1 C), resp. rate 17, height 5\' 7"  (1.702 m), weight 72.3 kg, SpO2 100%.  Medical Problem List and Plan: 1. 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  Etiology suspected to be A-fib not on anticoagulation versus large vessel disease             -patient may shower             -ELOS/Goals: 10 to 14 days, supervision to min assist with PT, OT, SLP             D/c date set for next Wednesday, hopefully- pt unhappy about it.   Con't CIR PT, OT and SLP  - PEG planned 10/14  2.  Antithrombotics: -DVT/anticoagulation:  Pharmaceutical: Heparin initiated for anticoagulation. PLAN TO TRANSITION TO ELIQUIS 5 MG BID ONCE ABLE TO SWALLOW. 9/28 still NPO 10/1- per pharmacy change change to Eliquis- stopped ASA and Heparin and added Eliquis - but then realized wasn't clear- called pharmacy again- will reach out to Dr Pearlean Brownie and see if need ASA at all? 10/2- ASA stopped per Dr Pearlean Brownie 10/11: Eliquis Dced, heparin bridge pending PEG placement Monday-- Heparin supratherapeutic, HGB down from 14->12 and BUN increasing; h/h in AM, FOBT ordered, monitor for apparent s/s blood loss 10/12: HgB stable 11.9; heparin rate unchanged per pharmacy              -antiplatelet therapy: Aspirin 325 mg daily 3. Pain Management: tylenol as needed  9/30- denies pain- con't regimen 4. Mood/Behavior/Sleep: Provide emotional  support             -antipsychotic agents: N/A 5. Neuropsych/cognition: This patient is not capable of making decisions on his own behalf. 6. Skin/Wound Care: Routine skin checks 7. Fluids/Electrolytes/Nutrition: Routine and analysis with follow-up chemistries 8.  Dysphagia.  Currently NPO, Cortrak. Speech therapy follow-up.  9/27- pulled cortrak accidentally last night- out a few inches- per nursing- KUB looks OK- reviewed it- can use- mittens? Recheck modified barium swallow on Tuesday,  October 1  10/1- pending MBSS  10/2- now on D3 nectar thick diet  10/5- Diet remains D1 with nectar thick. Continue TF also  10/6 pt ate 25-50-65% yesterday, will reduce TF to 35cc/hr  10/8- will look into PEG  10/11- spoke to Ir_plan for PEG Monday  -MBSS done- still at D1 nectar thick liquids  9.  Hypertension.  Imdur 60 mg daily.  Monitor with increased mobility.  Long-term blood pressure goal normotensive  9/26- will change Imdur to immediate release- d/w pharmacy- changed  10/11- BP usually controlled- is 150s this AM- however is unusual- will monitor trend  10/12 - 10/13: Well controlled  Vitals:   05/15/23 2018 05/16/23 0318  BP: 127/85 137/76  Pulse: 82 68  Resp:  17  Temp: 98.4 F (36.9 C) 98.8 F (37.1 C)  SpO2: 100% 100%    10.  Bradycardia into the 40s.  Coreg held.  Avoid negative chronotropic medications.   Monitor with increased mobility  9/30-10/1- doing slightly better- in 50s- not 40's  10/2-10/5 HR in 50s-60's - con't regimen - stable 10/12, 10/13 11.  Hyperlipidemia.  Pravachol 12.  CKD stage IIIa.  Follow-up chemistries  9/30- Cr 1.48- up from 1.30- BUN up to 29- will increase Free water to 250 cc q4 - don't want to increase further due to [facility]-- and see how goes in labs tomorrow-   10/1- BUN up to 34 and Cr 1.54 up from 1.48- will consult nutritionist  for free water changes-   10/2- spoke with nutritonist- will change TF's to night time only- maintain fluids, but  decrease protein- also adding supplements if doesn't eat enough- per pt/wife eating really well- will check BMP in AM  10/3- Pt's food downgraded to D1 diet- also BUN up to 42 up from 34 and Cr stable at 1.55- will give low dose IVFs to try and help turn this Around- will do 75cc for 12 hours 5pm to 5am- and recheck in AM with BMP  10/4- pending labs this AM- pt doesn't have labs drawn yet- pending  10/5 improved BUN/Cr yesterday--will increase free water thru tube today   10/6 pt ate better yesterday   -cut nocturnal TF in half   -continue increased H20 flushes   -labs tomorrow  10/7- BUN/Cr stable- however on water flushes as well- explained to pt will need to take double the amount of Nectar thick fluids, if remove Cortrak. Will d/w SLP and dietitian  10/8- still getting 300cc free water and I think pt needs PEG_ esp since they downgraded diet to D1 nectar thick again this AM- will d/w pt  10/12: FWF 300 ml Q4H; continue. Will get BMP with labs for heparin this evening --> BUN > Cr uptrending with HgB downtrending; repeat BMP in AM, concerning for GIB  10/13: HGB stable, vitals stable, FOBT pending but with improved BUN/Cr will continue current Tx      Latest Ref Rng & Units 05/16/2023    7:20 AM 05/15/2023    4:45 PM 05/11/2023    5:20 AM  BMP  Glucose 70 - 99 mg/dL 272  98  536   BUN 8 - 23 mg/dL 33  42  34   Creatinine 0.61 - 1.24 mg/dL 6.44  0.34  7.42   Sodium 135 - 145 mmol/L 133  139  136   Potassium 3.5 - 5.1 mmol/L 4.0  4.6  4.8   Chloride 98 - 111 mmol/L 101  104  100  CO2 22 - 32 mmol/L 27  26  28    Calcium 8.9 - 10.3 mg/dL 8.9  9.1  9.1     13.  Diastolic congestive heart failure.  Monitor for any signs of fluid overload. 9/28- stable   9/30- weight stable- 10/1- no weight today- will d/w nursing   10/2- weight up slightly- will monitor for trend  10/3- 71.3 kg today- up 1 lb from yesterday-if goes above 73 kg, will give Lasix  10/5- Weight 71.2 kg this AM-  stable  10/11-13- weight stable- con't to monitor Filed Weights   05/14/23 0549 05/15/23 0500 05/16/23 0422  Weight: 72.1 kg 72 kg 72.3 kg    14.  History of tobacco/alcohol use/Cocaine.  Provide counseling 15.  Medical noncompliance.  Counseling 16.  Atrial fibrillation.  Hold Coreg due to bradycardia.  Continue ASA and heparin --> Eliquis --> 10/11 heparin bridge for PEG   17. Constipation/bowel incontinence  9/26- will give sorbitol after therapy today  9/27- no results with sorbitol- will give another Sorbitol but 60cc and then soap suds enema if no results  10/1- cannot tell when having BM- has no control per wife- last went yesterday  10/4- LBM yesterday, but still has no control- we discussed doing suppository/bowel program- pt refused for now , but said he'd "think about it".   10/6- no bm since 10/3--add miralax and senokot-s at HS.   10/8-10/9 LBM yesterday- still having incontinence- pt doesn't want suppository-   10/10- LBM yesterday- still having incontinence- doesn't want bowel program  10/11- going daily per pt/wife- but cannot control it.   18. Hyperkalemia  9/26- will recheck in AM- is 5.3- however was 4.1- so hesitant to give Lokelma without rechecking 9/27 - K+ 4.2- so likely hemolysis yesterday  10/6 k+ 4.5 10/4 Repeeat 10/12 - 4.6 -> 10/13 4.0  19. Diabetes- on Tfs  9/26- will add SSI, since A1c 6.9 and on Tf's- do q4 hours per protocol.   9/30-10/1 CBGs running well- might need to change if gets diet with MBSS today  10/2- change to TID with meals- and monitor  10/3-10/5 CBGs reasonable control--no changes  10/12-13 - mildly elevated, like d/t tube feeds, continue SSI  CBG (last 3)  Recent Labs    05/15/23 1630 05/15/23 2059 05/16/23 0631  GLUCAP 112* 144* 132*   .  LOS: 18 days A FACE TO FACE EVALUATION WAS PERFORMED  Angelina Sheriff 05/16/2023, 9:59 AM

## 2023-05-17 ENCOUNTER — Inpatient Hospital Stay (HOSPITAL_COMMUNITY): Payer: Medicare PPO

## 2023-05-17 HISTORY — PX: IR GASTROSTOMY TUBE MOD SED: IMG625

## 2023-05-17 LAB — GLUCOSE, CAPILLARY
Glucose-Capillary: 82 mg/dL (ref 70–99)
Glucose-Capillary: 188 mg/dL — ABNORMAL HIGH (ref 70–99)

## 2023-05-17 LAB — CBC WITH DIFFERENTIAL/PLATELET
Abs Immature Granulocytes: 0.02 10*3/uL (ref 0.00–0.07)
Basophils Absolute: 0.1 10*3/uL (ref 0.0–0.1)
Basophils Relative: 1 %
Eosinophils Absolute: 0.3 10*3/uL (ref 0.0–0.5)
Eosinophils Relative: 6 %
HCT: 36.7 % — ABNORMAL LOW (ref 39.0–52.0)
Hemoglobin: 12.1 g/dL — ABNORMAL LOW (ref 13.0–17.0)
Immature Granulocytes: 0 %
Lymphocytes Relative: 25 %
Lymphs Abs: 1.2 10*3/uL (ref 0.7–4.0)
MCH: 27.5 pg (ref 26.0–34.0)
MCHC: 33 g/dL (ref 30.0–36.0)
MCV: 83.4 fL (ref 80.0–100.0)
Monocytes Absolute: 0.5 10*3/uL (ref 0.1–1.0)
Monocytes Relative: 11 %
Neutro Abs: 2.7 10*3/uL (ref 1.7–7.7)
Neutrophils Relative %: 57 %
Platelets: 104 10*3/uL — ABNORMAL LOW (ref 150–400)
RBC: 4.4 MIL/uL (ref 4.22–5.81)
RDW: 13.8 % (ref 11.5–15.5)
WBC: 4.8 10*3/uL (ref 4.0–10.5)
nRBC: 0 % (ref 0.0–0.2)

## 2023-05-17 LAB — COMPREHENSIVE METABOLIC PANEL
ALT: 16 U/L (ref 0–44)
AST: 17 U/L (ref 15–41)
Albumin: 3.3 g/dL — ABNORMAL LOW (ref 3.5–5.0)
Alkaline Phosphatase: 52 U/L (ref 38–126)
Anion gap: 7 (ref 5–15)
BUN: 30 mg/dL — ABNORMAL HIGH (ref 8–23)
CO2: 25 mmol/L (ref 22–32)
Calcium: 9.2 mg/dL (ref 8.9–10.3)
Chloride: 105 mmol/L (ref 98–111)
Creatinine, Ser: 1.31 mg/dL — ABNORMAL HIGH (ref 0.61–1.24)
GFR, Estimated: 59 mL/min — ABNORMAL LOW (ref >60)
Glucose, Bld: 109 mg/dL — ABNORMAL HIGH (ref 70–99)
Potassium: 4.5 mmol/L (ref 3.5–5.1)
Sodium: 137 mmol/L (ref 135–145)
Total Bilirubin: 0.9 mg/dL (ref 0.3–1.2)
Total Protein: 6.2 g/dL — ABNORMAL LOW (ref 6.5–8.1)

## 2023-05-17 LAB — PROTIME-INR
INR: 1.1 (ref 0.8–1.2)
Prothrombin Time: 14.8 s (ref 11.4–15.2)

## 2023-05-17 LAB — APTT: aPTT: 72 s — ABNORMAL HIGH (ref 24–36)

## 2023-05-17 LAB — HEPARIN LEVEL (UNFRACTIONATED): Heparin Unfractionated: 0.4 [IU]/mL (ref 0.30–0.70)

## 2023-05-17 MED ORDER — CEFAZOLIN SODIUM-DEXTROSE 2-4 GM/100ML-% IV SOLN
INTRAVENOUS | Status: AC | PRN
Start: 2023-05-17 — End: 2023-05-17
  Administered 2023-05-17: 2 g via INTRAVENOUS

## 2023-05-17 MED ORDER — GLUCAGON HCL RDNA (DIAGNOSTIC) 1 MG IJ SOLR
INTRAMUSCULAR | Status: AC | PRN
  Administered 2023-05-17: .5 mg via INTRAVENOUS

## 2023-05-17 MED ORDER — FENTANYL CITRATE (PF) 100 MCG/2ML IJ SOLN
INTRAMUSCULAR | Status: AC | PRN
Start: 2023-05-17 — End: 2023-05-17
  Administered 2023-05-17 (×2): 25 ug via INTRAVENOUS

## 2023-05-17 MED ORDER — HEPARIN (PORCINE) 25000 UT/250ML-% IV SOLN
1050.0000 [IU]/h | INTRAVENOUS | Status: AC
  Administered 2023-05-17: 1050 [IU]/h via INTRAVENOUS
  Filled 2023-05-17: qty 250

## 2023-05-17 MED ORDER — IOHEXOL 300 MG/ML  SOLN
50.0000 mL | Freq: Once | INTRAMUSCULAR | Status: AC | PRN
  Administered 2023-05-17: 15 mL

## 2023-05-17 MED ORDER — FENTANYL CITRATE (PF) 100 MCG/2ML IJ SOLN
INTRAMUSCULAR | Status: AC
  Filled 2023-05-17: qty 2

## 2023-05-17 MED ORDER — MIDAZOLAM HCL 2 MG/2ML IJ SOLN
INTRAMUSCULAR | Status: AC | PRN
Start: 2023-05-17 — End: 2023-05-17
  Administered 2023-05-17: .5 mg via INTRAVENOUS
  Administered 2023-05-17: 1 mg via INTRAVENOUS

## 2023-05-17 MED ORDER — LIDOCAINE VISCOUS HCL 2 % MT SOLN
OROMUCOSAL | Status: AC
  Filled 2023-05-17: qty 15

## 2023-05-17 MED ORDER — FENTANYL CITRATE (PF) 100 MCG/2ML IJ SOLN
INTRAMUSCULAR | Status: AC | PRN
Start: 2023-05-17 — End: 2023-05-17
  Administered 2023-05-17: 50 ug via INTRAVENOUS

## 2023-05-17 MED ORDER — GLUCAGON HCL RDNA (DIAGNOSTIC) 1 MG IJ SOLR
INTRAMUSCULAR | Status: AC
  Filled 2023-05-17: qty 1

## 2023-05-17 MED ORDER — MIDAZOLAM HCL 2 MG/2ML IJ SOLN
INTRAMUSCULAR | Status: AC
  Filled 2023-05-17: qty 2

## 2023-05-17 MED ORDER — LIDOCAINE-EPINEPHRINE 1 %-1:100000 IJ SOLN
INTRAMUSCULAR | Status: AC
  Filled 2023-05-17: qty 1

## 2023-05-17 MED ORDER — MIDAZOLAM HCL 2 MG/2ML IJ SOLN
INTRAMUSCULAR | Status: AC | PRN
Start: 2023-05-17 — End: 2023-05-17
  Administered 2023-05-17: .5 mg via INTRAVENOUS

## 2023-05-17 MED ORDER — CEFAZOLIN SODIUM-DEXTROSE 2-4 GM/100ML-% IV SOLN
INTRAVENOUS | Status: AC
  Filled 2023-05-17: qty 100

## 2023-05-17 MED ORDER — LIDOCAINE-EPINEPHRINE 1 %-1:100000 IJ SOLN
20.0000 mL | Freq: Once | INTRAMUSCULAR | Status: AC
  Administered 2023-05-17: 10 mL via INTRADERMAL

## 2023-05-17 NOTE — Procedures (Signed)
Vascular and Interventional Radiology Procedure Note  Patient: Timothy [name] DOB: Oct 14, 1955 Medical Record Number: [id number] Note Date/Time: 05/17/23 12:26 PM   Performing Physician: Roanna Banning, MD Assistant(s): None  Diagnosis: CVA. Dysphagia  Procedure: PERCUTANEOUS GASTROSTOMY TUBE PLACEMENT  Anesthesia: Conscious Sedation Complications: None Estimated Blood Loss: Minimal  Findings:  Successful placement of a  39 F gastrostomy tube under fluoroscopy.   See detailed procedure note with images in PACS. The patient tolerated the procedure well without incident or complication and was returned to Recovery in stable condition.    Roanna Banning, MD [facility]   Pager. [phone] Clinic. [phone]

## 2023-05-17 NOTE — Progress Notes (Signed)
PROGRESS NOTE   Subjective/Complaints:   Pt reports getting PEG sometime today- spoke with pharmacy about this- Heparin gtt was stopped at 6am, per IR.  Is NPO this AM.  Has no complaints.    ROS: limited by aphasia.  Objective:   No results found. Recent Labs    05/16/23 0720 05/17/23 0605  WBC 4.5 4.8  HGB 11.9* 12.1*  HCT 36.7* 36.7*  PLT 113* 104*      Recent Labs    05/16/23 0720 05/17/23 0605  NA 133* 137  K 4.0 4.5  CL 101 105  CO2 27 25  GLUCOSE 123* 109*  BUN 33* 30*  CREATININE 1.21 1.31*  CALCIUM 8.9 9.2     Intake/Output Summary (Last 24 hours) at 05/17/2023 1029 Last data filed at 05/17/2023 0847 Gross per 24 hour  Intake 534 ml  Output 1125 ml  Net -591 ml         Physical Exam: Vital Signs Blood pressure 138/80, pulse 60, temperature (!) 97.3 F (36.3 C), resp. rate 17, height 5\' 7"  (1.702 m), weight 72.5 kg, SpO2 100%.    General: awake, alert, appropriate, watching TV intently- wife at bedside; NAD HENT: conjugate gaze; oropharynx dry- cortrak in place CV: regular rhythm, bradycardic rate; no JVD Pulmonary: CTA B/L; no W/R/R- good air movement GI: soft, NT, ND, (+)BS Psychiatric: appropriate Neurological: aphasic- but answers in word level speech MSK: Cannot fully extend R 5th digit- otherwise full ROM  Neuro: Oriented to person, not oriented to place with options, oriented to month but not year Moving all 4 extremities antigravity and against resistance in bed Prior exam:  RUE: 4-/5 Deltoid, 4-/5 Biceps, 4-/5 Triceps, 4-/5 Grip--4/5 LUE: 5/5 Deltoid, 5/5 Biceps, 5/5 Triceps,  5/5 Grip RLE: HF 4/5, KE 4+/5, ADF 4+/5, APF 4+/5 LLE: HF 5/5, KE 5/5, ADF 5/5, APF 5/5  Assessment/Plan: 1. Functional deficits which require 3+ hours per day of interdisciplinary therapy in a comprehensive inpatient rehab setting. Physiatrist is providing close team supervision and 24 hour  management of active medical problems listed below. Physiatrist and rehab team continue to assess barriers to discharge/monitor patient progress toward functional and medical goals  Care Tool:  Bathing    Body parts bathed by patient: Right arm, Left arm, Chest, Abdomen, Front perineal area, Buttocks, Right upper leg, Left upper leg, Right lower leg, Left lower leg, Face   Body parts bathed by helper: Left lower leg, Right lower leg, Buttocks     Bathing assist Assist Level: Minimal Assistance - Patient > 75%     Upper Body Dressing/Undressing Upper body dressing   What is the patient wearing?: Pull over shirt    Upper body assist Assist Level: Minimal Assistance - Patient > 75%    Lower Body Dressing/Undressing Lower body dressing      What is the patient wearing?: Pants, Incontinence brief     Lower body assist Assist for lower body dressing: Moderate Assistance - Patient 50 - 74%     Toileting Toileting    Toileting assist Assist for toileting: Total Assistance - Patient < 25%     Transfers Chair/bed transfer  Transfers assist  Chair/bed transfer assist level: Contact Guard/Touching assist     Locomotion Ambulation   Ambulation assist      Assist level: Contact Guard/Touching assist Assistive device: No Device Max distance: 150'   Walk 10 feet activity   Assist     Assist level: Contact Guard/Touching assist Assistive device: No Device   Walk 50 feet activity   Assist    Assist level: Contact Guard/Touching assist Assistive device: No Device    Walk 150 feet activity   Assist    Assist level: Contact Guard/Touching assist Assistive device: No Device    Walk 10 feet on uneven surface  activity   Assist     Assist level: Minimal Assistance - Patient > 75%     Wheelchair     Assist Is the patient using a wheelchair?: Yes Type of Wheelchair: Manual    Wheelchair assist level: Dependent - Patient 0% Max  wheelchair distance: 150'    Wheelchair 50 feet with 2 turns activity    Assist        Assist Level: Dependent - Patient 0%   Wheelchair 150 feet activity     Assist      Assist Level: Dependent - Patient 0%   Blood pressure 138/80, pulse 60, temperature (!) 97.3 F (36.3 C), resp. rate 17, height 5\' 7"  (1.702 m), weight 72.5 kg, SpO2 100%.  Medical Problem List and Plan: 1. 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  Etiology suspected to be A-fib not on anticoagulation versus large vessel disease             -patient may shower             -ELOS/Goals: 10 to 14 days, supervision to min assist with PT, OT, SLP             D/c date set for next Wednesday, hopefully- pt unhappy about it.   Con't CIR PT and OT and SLP  - PEG planned 10/14- to be done some time today.   2.  Antithrombotics: -DVT/anticoagulation:  Pharmaceutical: Heparin initiated for anticoagulation. PLAN TO TRANSITION TO ELIQUIS 5 MG BID ONCE ABLE TO SWALLOW. 9/28 still NPO 10/1- per pharmacy change change to Eliquis- stopped ASA and Heparin and added Eliquis - but then realized wasn't clear- called pharmacy again- will reach out to Dr Pearlean Brownie and see if need ASA at all? 10/2- ASA stopped per Dr Pearlean Brownie 10/11: Eliquis Dced, heparin bridge pending PEG placement Monday-- Heparin supratherapeutic, HGB down from 14->12 and BUN increasing; h/h in AM, FOBT ordered, monitor for apparent s/s blood loss 10/12: HgB stable 11.9; heparin rate unchanged per pharmacy 10/14- Hb stable at 12.1- Plts down to 104k- if keeps dropping, will do more intervention             -antiplatelet therapy: Aspirin 325 mg daily 3. Pain Management: tylenol as needed  9/30- denies pain- con't regimen 4. Mood/Behavior/Sleep: Provide emotional support             -antipsychotic agents: N/A 5. Neuropsych/cognition: This patient is not capable of making decisions on his own  behalf. 6. Skin/Wound Care: Routine skin checks 7. Fluids/Electrolytes/Nutrition: Routine and analysis with follow-up chemistries 8.  Dysphagia.  Currently NPO, Cortrak. Speech therapy follow-up.  9/27- pulled cortrak accidentally last night- out a few inches- per nursing- KUB looks OK- reviewed it- can use- mittens? Recheck modified barium swallow on Tuesday, October 1  10/1- pending MBSS  10/2- now on D3 nectar thick diet  10/5- Diet remains D1 with nectar thick. Continue TF also  10/6 pt ate 25-50-65% yesterday, will reduce TF to 35cc/hr  10/8- will look into PEG  10/11- spoke to Ir_plan for PEG Monday  -MBSS done- still at D1 nectar thick liquids  10/14- PEG scheduled for today 9.  Hypertension.  Imdur 60 mg daily.  Monitor with increased mobility.  Long-term blood pressure goal normotensive  9/26- will change Imdur to immediate release- d/w pharmacy- changed  10/11- BP usually controlled- is 150s this AM- however is unusual- will monitor trend  10/14- BP overall controlled has a few very slightly elevated- con't to monitor Vitals:   05/16/23 1947 05/17/23 0540  BP: (!) 142/70 138/80  Pulse: 78 60  Resp: 17 17  Temp: 98.4 F (36.9 C) (!) 97.3 F (36.3 C)  SpO2: 99% 100%    10.  Bradycardia into the 40s.  Coreg held.  Avoid negative chronotropic medications.   Monitor with increased mobility  9/30-10/1- doing slightly better- in 50s- not 40's  10/2-10/5 HR in 50s-60's - con't regimen - stable 10/12, 10/13 11.  Hyperlipidemia.  Pravachol 12.  CKD stage IIIa.  Follow-up chemistries  9/30- Cr 1.48- up from 1.30- BUN up to 29- will increase Free water to 250 cc q4 - don't want to increase further due to [facility]-- and see how goes in labs tomorrow-   10/1- BUN up to 34 and Cr 1.54 up from 1.48- will consult nutritionist  for free water changes-   10/2- spoke with nutritonist- will change TF's to night time only- maintain fluids, but decrease protein- also adding supplements if doesn't  eat enough- per pt/wife eating really well- will check BMP in AM  10/3- Pt's food downgraded to D1 diet- also BUN up to 42 up from 34 and Cr stable at 1.55- will give low dose IVFs to try and help turn this Around- will do 75cc for 12 hours 5pm to 5am- and recheck in AM with BMP  10/4- pending labs this AM- pt doesn't have labs drawn yet- pending  10/5 improved BUN/Cr yesterday--will increase free water thru tube today   10/6 pt ate better yesterday   -cut nocturnal TF in half   -continue increased H20 flushes   -labs tomorrow  10/7- BUN/Cr stable- however on water flushes as well- explained to pt will need to take double the amount of Nectar thick fluids, if remove Cortrak. Will d/w SLP and dietitian  10/8- still getting 300cc free water and I think pt needs PEG_ esp since they downgraded diet to D1 nectar thick again this AM- will d/w pt  10/12: FWF 300 ml Q4H; continue. Will get BMP with labs for heparin this evening --> BUN > Cr uptrending with HgB downtrending; repeat BMP in AM, concerning for GIB  10/13: HGB stable, vitals stable, FOBT pending but with improved BUN/Cr will continue current Tx   10/14- BUN 1.31 and BUN 30- Hb back up to 12.1- is less dry- so not sure why- pending FOBT    Latest Ref Rng & Units 05/17/2023    6:05 AM 05/16/2023    7:20 AM 05/15/2023    4:45 PM  BMP  Glucose 70 - 99 mg/dL 161  096  98   BUN 8 - 23 mg/dL 30  33  42   Creatinine 0.61 - 1.24 mg/dL 0.45  4.09  8.11   Sodium 135 - 145 mmol/L 137  133  139   Potassium 3.5 - 5.1 mmol/L 4.5  4.0  4.6   Chloride 98 - 111 mmol/L 105  101  104   CO2 22 - 32 mmol/L 25  27  26    Calcium 8.9 - 10.3 mg/dL 9.2  8.9  9.1     13.  Diastolic congestive heart failure.  Monitor for any signs of fluid overload. 9/28- stable   9/30- weight stable- 10/1- no weight today- will d/w nursing   10/2- weight up slightly- will monitor for trend  10/3- 71.3 kg today- up 1 lb from yesterday-if goes above 73 kg, will give  Lasix  10/5- Weight 71.2 kg this AM- stable  10/14- weight stable Filed Weights   05/15/23 0500 05/16/23 0422 05/17/23 0500  Weight: 72 kg 72.3 kg 72.5 kg    14.  History of tobacco/alcohol use/Cocaine.  Provide counseling 15.  Medical noncompliance.  Counseling 16.  Atrial fibrillation.  Hold Coreg due to bradycardia.  Continue ASA and heparin --> Eliquis --> 10/11 heparin bridge for PEG   17. Constipation/bowel incontinence  9/26- will give sorbitol after therapy today  9/27- no results with sorbitol- will give another Sorbitol but 60cc and then soap suds enema if no results  10/1- cannot tell when having BM- has no control per wife- last went yesterday  10/4- LBM yesterday, but still has no control- we discussed doing suppository/bowel program- pt refused for now , but said he'd "think about it".   10/6- no bm since 10/3--add miralax and senokot-s at HS.   10/8-10/9 LBM yesterday- still having incontinence- pt doesn't want suppository-   10/10- LBM yesterday- still having incontinence- doesn't want bowel program  10/11- going daily per pt/wife- but cannot control it.   10/14- LBM 2 days ago- if doesn't go by tomorrow, will intervene 18. Hyperkalemia  9/26- will recheck in AM- is 5.3- however was 4.1- so hesitant to give Lokelma without rechecking 9/27 - K+ 4.2- so likely hemolysis yesterday  10/6 k+ 4.5 10/4 Repeeat 10/12 - 4.6 -> 10/13 4.0 10/14- K_ 4.5 19. Diabetes- on Tfs  9/26- will add SSI, since A1c 6.9 and on Tf's- do q4 hours per protocol.   9/30-10/1 CBGs running well- might need to change if gets diet with MBSS today  10/2- change to TID with meals- and monitor  10/3-10/5 CBGs reasonable control--no changes  10/12-13 - mildly elevated, like d/t tube feeds, continue SSI  10/14- Last checked yesterday AM-  CBG (last 3)  Recent Labs    05/15/23 1630 05/15/23 2059 05/16/23 0631  GLUCAP 112* 144* 132*     I spent a total of 38   minutes on total care today- >50%  coordination of care- due to  D/w pt and wife about PEG; plans; d/w pharmacy about PEG and timing; and review of labs, vitals including plts low- BUN/Cr looking better- Hb has dropped some-    LOS: 19 days A FACE TO FACE EVALUATION WAS PERFORMED  Caeley Dohrmann 05/17/2023, 10:29 AM

## 2023-05-17 NOTE — Progress Notes (Signed)
Physical Therapy Session Note  Patient Details  Name: RHILEY SOLEM MRN: [id number] Date of Birth: Dec 26, 1955  Today's Date: 05/17/2023 PT Missed Time: 60 Minutes Missed Time Reason: Patient unwilling to participate  Short Term Goals: Week 3:  PT Short Term Goal 1 (Week 3): STG's=LTG's due to ELOS  Skilled Therapeutic Interventions/Progress Updates:      Therapy Documentation Precautions:  Precautions Precautions: Fall Precaution Comments: R inattention; SBP <180, cortrak Restrictions Weight Bearing Restrictions: No  Pt refused PT as awaiting surgical procedure, will make up missed minutes as able.    Therapy/Group: Individual Therapy  [name] [name] PT, DPT  05/17/2023, 7:49 AM

## 2023-05-17 NOTE — Progress Notes (Signed)
Occupational Therapy Note  Patient Details  Name: Timothy [name] MRN: [id number] Date of Birth: 04/28/1956  Today's Date: 05/17/2023 OT Missed Time: 75 Minutes Missed Time Reason: Patient fatigue;Other (comment) (Pt just got back from PEG tube placement, pt reported fatigue and wanting to rest after procedure)  Will attempt to make up missed minutes as pt schedule allows.  Velia Meyer, OTD, OTR/L 05/17/2023, 3:18 PM

## 2023-05-17 NOTE — Progress Notes (Signed)
Speech Language Pathology Daily Session Note  Patient Details  Name: Timothy [name] MRN: [id number] Date of Birth: 1955/11/07  Today's Date: 05/17/2023 SLP Individual Time: 9562-1308 SLP Individual Time Calculation (min): 22 min  Short Term Goals: Week 3: SLP Short Term Goal 1 (Week 3): STGs=LTGs d/t ELOS  Skilled Therapeutic Interventions: SLP conducted skilled therapy session targeting communication and cognitive retraining goals. Patient partially reclined in bed but agreeable to repositioning to facilitate improved attention/participation in therapy tasks. SLP guided patient through various communication/cognitive tasks with patient benefiting from min-modA for basic auditory comprehension, maxA for minimally complex object naming, modA for environmental object naming, and totalA for reading at the word level. Patient required min-modA to sustain attention to task and supervision to scan full presented materials. At 1122, transport entered to bring patient to G-tube procedure, thus, SLP session truncated. SLP will continue with plan of care as appropriate.      Pain Pain Assessment Pain Scale: 0-10 Pain Score: 0-No pain  Therapy/Group: Individual Therapy  Timothy [name], M.A., CCC-SLP  Timothy [name] 05/17/2023, 12:14 PM

## 2023-05-17 NOTE — Progress Notes (Signed)
PHARMACY - ANTICOAGULATION CONSULT NOTE  Pharmacy Consult for heparin Indication: atrial fibrillation and stroke  Labs: Recent Labs    05/15/23 1645 05/15/23 1853 05/15/23 1853 05/16/23 0720 05/16/23 1555 05/17/23 0605 05/17/23 1140 05/17/23 2217  HGB  --  12.4*   < > 11.9*  --  12.1*  --   --   HCT  --  38.1*  --  36.7*  --  36.7*  --   --   PLT  --  117*  --  113*  --  104*  --   --   APTT  --  70*   < > 41* 86*  --   --  72*  LABPROT  --   --   --   --   --   --  14.8  --   INR  --   --   --   --   --   --  1.1  --   HEPARINUNFRC  --  >1.10*  --  0.92*  --   --   --  0.40  CREATININE 1.67*  --   --  1.21  --  1.31*  --   --    < > = values in this interval not displayed.    Assessment/Plan:  67yo male therapeutic on heparin after resuming s/p PEG placement. Will continue infusion at current rate of 1050 units/hr and confirm stable with am labs.  Vernard Gambles, PharmD, BCPS 05/17/2023 11:21 PM

## 2023-05-18 ENCOUNTER — Other Ambulatory Visit (HOSPITAL_COMMUNITY): Payer: Self-pay

## 2023-05-18 LAB — GLUCOSE, CAPILLARY
Glucose-Capillary: 158 mg/dL — ABNORMAL HIGH (ref 70–99)
Glucose-Capillary: 90 mg/dL (ref 70–99)
Glucose-Capillary: 91 mg/dL (ref 70–99)
Glucose-Capillary: 106 mg/dL — ABNORMAL HIGH (ref 70–99)

## 2023-05-18 LAB — HEPARIN LEVEL (UNFRACTIONATED): Heparin Unfractionated: 0.31 [IU]/mL (ref 0.30–0.70)

## 2023-05-18 LAB — APTT: aPTT: 71 s — ABNORMAL HIGH (ref 24–36)

## 2023-05-18 MED ORDER — FREE WATER: 400.0000 mL | Freq: Three times a day (TID) | Status: AC

## 2023-05-18 MED ORDER — FOLIC ACID 1 MG PO TABS
1.0000 mg | ORAL_TABLET | Freq: Every day | ORAL | 0 refills | Status: AC
  Filled 2023-05-18: qty 30, 30d supply, fill #0

## 2023-05-18 MED ORDER — HYDROCERIN EX CREA: 1.0000 | TOPICAL_CREAM | Freq: Two times a day (BID) | CUTANEOUS | Status: AC

## 2023-05-18 MED ORDER — FREE WATER
400.0000 mL | Freq: Three times a day (TID) | Status: DC
  Administered 2023-05-18 – 2023-05-19 (×4): 400 mL

## 2023-05-18 MED ORDER — POLYETHYLENE GLYCOL 3350 17 GM/SCOOP PO POWD
17.0000 g | Freq: Every day | ORAL | 0 refills | Status: DC
  Filled 2023-05-18: qty 476, 28d supply, fill #0

## 2023-05-18 MED ORDER — OSMOLITE 1.5 CAL PO LIQD: 1000.0000 mL | ORAL | Status: DC

## 2023-05-18 MED ORDER — ISOSORBIDE DINITRATE 20 MG PO TABS
20.0000 mg | ORAL_TABLET | Freq: Three times a day (TID) | ORAL | 0 refills | Status: AC
  Filled 2023-05-18: qty 90, 30d supply, fill #0

## 2023-05-18 MED ORDER — ADULT MULTIVITAMIN W/MINERALS CH
1.0000 | ORAL_TABLET | Freq: Every day | ORAL | 0 refills | Status: AC
  Filled 2023-05-18: qty 30, 30d supply, fill #0

## 2023-05-18 MED ORDER — SENNOSIDES-DOCUSATE SODIUM 8.6-50 MG PO TABS
2.0000 | ORAL_TABLET | Freq: Every day | ORAL | 0 refills | Status: DC
  Filled 2023-05-18: qty 60, 30d supply, fill #0

## 2023-05-18 MED ORDER — APIXABAN 5 MG PO TABS
5.0000 mg | ORAL_TABLET | Freq: Two times a day (BID) | ORAL | Status: DC
  Administered 2023-05-18 – 2023-05-19 (×3): 5 mg via ORAL
  Filled 2023-05-18 (×3): qty 1

## 2023-05-18 MED ORDER — THIAMINE HCL 100 MG PO TABS
100.0000 mg | ORAL_TABLET | Freq: Every day | ORAL | 0 refills | Status: AC
  Filled 2023-05-18: qty 30, 30d supply, fill #0

## 2023-05-18 MED ORDER — APIXABAN 5 MG PO TABS
5.0000 mg | ORAL_TABLET | Freq: Two times a day (BID) | ORAL | 0 refills | Status: AC
Start: 2023-05-18 — End: ?
  Filled 2023-05-18: qty 60, 30d supply, fill #0

## 2023-05-18 MED ORDER — PRAVASTATIN SODIUM 40 MG PO TABS
40.0000 mg | ORAL_TABLET | Freq: Every day | ORAL | 0 refills | Status: AC
  Filled 2023-05-18: qty 30, 30d supply, fill #0

## 2023-05-18 MED ORDER — APIXABAN 5 MG PO TABS: 5.0000 mg | ORAL_TABLET | Freq: Two times a day (BID) | ORAL | Status: DC

## 2023-05-18 MED ORDER — FREE WATER
200.0000 mL | Status: DC
  Administered 2023-05-18: 200 mL

## 2023-05-18 NOTE — Progress Notes (Signed)
Physical Therapy Discharge Summary  Patient Details  Name: Timothy [name] MRN: [id number] Date of Birth: 1955/11/07  Date of Discharge from PT service:May 18, 2023  Today's Date: 05/18/2023 PT Individual Time: 0850-0934  PT Individual Time Calculation (min): 44 min    Patient has met 9 of 9 long term goals due to improved balance, increased strength, ability to compensate for deficits, and functional use of  right upper extremity and right lower extremity.  Patient to discharge at an ambulatory level Supervision.    Reasons goals not met: N/A  Recommendation:  Patient will benefit from ongoing skilled PT services in outpatient setting to continue to advance safe functional mobility, address ongoing impairments in balance, and minimize fall risk.  Equipment: No equipment provided  Reasons for discharge: treatment goals met and discharge from hospital  Patient/family agrees with progress made and goals achieved: Yes  PT Discharge Pain Pain Assessment Pain Scale: 0-10 Pain Score: 0-No pain Pain Interference Pain Interference Pain Effect on Sleep: 1. Rarely or not at all Pain Interference with Therapy Activities: 1. Rarely or not at all Pain Interference with Day-to-Day Activities: 1. Rarely or not at all Cognition Overall Cognitive Status: Impaired/Different from baseline Arousal/Alertness: Awake/alert Orientation Level: Oriented to person;Oriented to place;Disoriented to time;Disoriented to situation Memory: Impaired Awareness: Impaired Problem Solving: Impaired Safety/Judgment: Impaired Sensation Sensation Light Touch: Appears Intact Proprioception: Impaired by gross assessment Proprioception Impaired Details: Impaired RUE;Impaired RLE Coordination Gross Motor Movements are Fluid and Coordinated: Yes Fine Motor Movements are Fluid and Coordinated: No Coordination and Movement Description: R ataxia Finger Nose Finger Test: R UE dysmetria Heel Shin Test: R LE  ataxia Motor  Motor Motor: Ataxia Motor - Skilled Clinical Observations: R ataxia  Mobility Bed Mobility Bed Mobility: Rolling Right;Rolling Left;Supine to Sit;Sit to Supine Rolling Right: Supervision/verbal cueing Rolling Left: Supervision/Verbal cueing Supine to Sit: Supervision/Verbal cueing Sit to Supine: Supervision/Verbal cueing Transfers Transfers: Sit to Stand;Stand to Sit;Stand Pivot Transfers Sit to Stand: Supervision/Verbal cueing Stand to Sit: Supervision/Verbal cueing Stand Pivot Transfers: Supervision/Verbal cueing Transfer (Assistive device): None Locomotion  Gait Ambulation: Yes Gait Assistance: Supervision/Verbal cueing Gait Distance (Feet): 500 Feet Assistive device: None Gait Gait: Yes Gait Pattern: Within Functional Limits Gait velocity: decreased Stairs / Additional Locomotion Stairs: Yes Stairs Assistance: Supervision/Verbal cueing Stair Management Technique: No rails Number of Stairs: 13 Height of Stairs: 6 (inches) Ramp: Supervision/Verbal cueing Curb: Supervision/Verbal cueing Wheelchair Mobility Wheelchair Mobility: No  Trunk/Postural Assessment  Cervical Assessment Cervical Assessment: Within Functional Limits Thoracic Assessment Thoracic Assessment: Within Functional Limits Lumbar Assessment Lumbar Assessment: Within Functional Limits Postural Control Postural Control: Within Functional Limits  Balance Balance Balance Assessed: Yes Dynamic Sitting Balance Dynamic Sitting - Balance Support: Feet supported Dynamic Sitting - Level of Assistance: 5: Stand by assistance (supervision) Static Standing Balance Static Standing - Balance Support: During functional activity Static Standing - Level of Assistance: 5: Stand by assistance (supervision) Dynamic Standing Balance Dynamic Standing - Balance Support: During functional activity Dynamic Standing - Level of Assistance: 5: Stand by assistance (supervision) Extremity Assessment  RLE  Assessment RLE Assessment: Within Functional Limits LLE Assessment LLE Assessment: Within Functional Limits   PT Treatment Session:   Pt supervision for all mobility including bed mobility, transfers, gait 300 ft x 2 and with dynamic standing balance without AD. Pt declines pain and PT assessed pain interference, sensation, coordination, strength and balance. Pt demonstrates improved right UE integration with complex functional task and engaged in playing tennis with racquet and badminton ball. Pt  requires min cues to maintain right hand grip with racquet and would benefit from continued reactive balance training as pt accurate ~20 % of time with hitting tossed ball with racquet. Pt demonstrates safe strategies with picking objects of the floor and good squat technique. Pt left semi-reclined in bed with all needs in reach and alarm on.   Timothy [name] Timothy [name] PT, DPT  05/18/2023, 9:11 AM

## 2023-05-18 NOTE — Progress Notes (Signed)
Patient ID: Timothy [name], male   DOB: 09/20/1955, 67 y.o.   MRN: [id number]  Met with pt and wife to give team conference update and being ready for discharge tomorrow. Pt is glad to have the NG tube out of his nose and feels ready to go home. Wife is learning how ot use the PEG, flush etc and feels will be ok at home. Will have home health to reinforce teaching along with therapies. Center well aware discharge date set for tomorrow. Tube being used for hydration and not feedings.

## 2023-05-18 NOTE — Progress Notes (Signed)
PROGRESS NOTE   Subjective/Complaints:   Pt reports had a BM last night Feeling OK- not hurting after PEG D/w wife about learning to handle PEG_ that it's for water and Ensure/Boost after d/c  If he doesn't eat enough, use PEG to feed him.  Also d/w nursing to get pt/wife taught how to handle PEG and TF's.   Will double check pt has PCP   ROS: limited by aphasia   Objective:   IR GASTROSTOMY TUBE MOD SED  Result Date: 05/17/2023 INDICATION: Dysphagia.  CVA. EXAM: PERCUTANEOUS GASTROSTOMY TUBE PLACEMENT COMPARISON:  CT abdomen, 05/13/2023. FL swallow evaluation, 05/13/2023. MEDICATIONS: Ancef 2 gm IV; Antibiotics were administered within 1 hour of the procedure. CONTRAST:  15 mL of Isovue 300 administered into the gastric lumen. ANESTHESIA/SEDATION: Moderate (conscious) sedation was employed during this procedure. A total of Versed 2 mg and Fentanyl 100 mcg was administered intravenously. Moderate Sedation Time: 13 minutes. The patient's level of consciousness and vital signs were monitored continuously by radiology nursing throughout the procedure under my direct supervision. FLUOROSCOPY TIME:  Fluoroscopic dose; 2 mGy COMPLICATIONS: None immediate. PROCEDURE: Informed written consent was obtained from the patient and/or patient's representative following explanation of the procedure, risks, benefits and alternatives. A time out was performed prior to the initiation of the procedure. Maximal barrier sterile technique utilized including caps, mask, sterile gowns, sterile gloves, large sterile drape, hand hygiene and Betadine prep. The left upper quadrant was sterilely prepped and draped. A oral gastric catheter was inserted into the stomach under fluoroscopy. The existing nasogastric feeding tube was removed. The left costal margin and barium opacified transverse colon were identified and avoided. Air was injected into the stomach for  insufflation and visualization under fluoroscopy. Under sterile conditions and local anesthesia, 2T tacks were utilized to pexy the anterior aspect of the stomach against the ventral abdominal wall. Contrast injection confirmed appropriate positioning of each of the T tacks. An incision was made between the T tacks and a 17 gauge trocar needle was utilized to access the stomach. Needle position was confirmed within the stomach with aspiration of air and injection of a small amount of contrast. A stiff Glidewire was advanced into the gastric lumen and under intermittent fluoroscopic guidance, the access needle was exchanged for a telescoping peel-away sheath, ultimately allowing placement of an 18 Fr balloon retention gastrostomy tube. The retention balloon was insufflated with a mixture of dilute saline and contrast and pulled taut against the anterior wall of the stomach. The external disc was cinched. Contrast injection confirms positioning within the stomach. Several spot radiographic images were obtained in various obliquities for documentation. The patient tolerated procedure well without immediate post procedural complication. FINDINGS: After successful fluoroscopic guided placement, the gastrostomy tube is appropriately positioned with internal retention balloon against the ventral aspect of the gastric lumen. IMPRESSION: Successful fluoroscopic-guided percutaneous insertion of an 18 Fr balloon-retention gastrostomy tube. The gastrostomy may be used immediately for medication administration and in 4 hrs for the initiation of feeds. RECOMMENDATIONS: The patient will return to Vascular [facility]iology (VIR) for routine feeding tube evaluation and exchange in 6 months. Roanna Banning, MD [facility] [place] Radiology Electronically  Signed   By: Roanna Banning M.D.   On: 05/17/2023 16:50   Recent Labs    05/16/23 0720 05/17/23 0605  WBC 4.5 4.8  HGB 11.9* 12.1*   HCT 36.7* 36.7*  PLT 113* 104*      Recent Labs    05/16/23 0720 05/17/23 0605  NA 133* 137  K 4.0 4.5  CL 101 105  CO2 27 25  GLUCOSE 123* 109*  BUN 33* 30*  CREATININE 1.21 1.31*  CALCIUM 8.9 9.2     Intake/Output Summary (Last 24 hours) at 05/18/2023 0855 Last data filed at 05/18/2023 0500 Gross per 24 hour  Intake 986.28 ml  Output 1950 ml  Net -963.72 ml         Physical Exam: Vital Signs Blood pressure 139/84, pulse 62, temperature 98.7 F (37.1 C), resp. rate 15, height 5\' 7"  (1.702 m), weight 72.1 kg, SpO2 100%.     General: awake, alert, appropriate, sitting up watching TV; NAD HENT: conjugate gaze; oropharynx dry- cortrak in place CV: regular rate and rhythm; this AM no JVD Pulmonary: CTA B/L; no W/R/R- good air movement GI: soft, NT, ND, (+)BS- PEG seen- covered C/D/I Psychiatric: appropriate Neurological: aphasic- at word level-  MSK: Cannot fully extend R 5th digit- otherwise full ROM  Neuro: Oriented to person, not oriented to place with options, oriented to month but not year Moving all 4 extremities antigravity and against resistance in bed Prior exam:  RUE: 4-/5 Deltoid, 4-/5 Biceps, 4-/5 Triceps, 4-/5 Grip--4/5 LUE: 5/5 Deltoid, 5/5 Biceps, 5/5 Triceps,  5/5 Grip RLE: HF 4/5, KE 4+/5, ADF 4+/5, APF 4+/5 LLE: HF 5/5, KE 5/5, ADF 5/5, APF 5/5  Assessment/Plan: 1. Functional deficits which require 3+ hours per day of interdisciplinary therapy in a comprehensive inpatient rehab setting. Physiatrist is providing close team supervision and 24 hour management of active medical problems listed below. Physiatrist and rehab team continue to assess barriers to discharge/monitor patient progress toward functional and medical goals  Care Tool:  Bathing    Body parts bathed by patient: Right arm, Left arm, Chest, Abdomen, Front perineal area, Buttocks, Right upper leg, Left upper leg, Right lower leg, Left lower leg, Face   Body parts bathed  by helper: Left lower leg, Right lower leg, Buttocks     Bathing assist Assist Level: Supervision/Verbal cueing     Upper Body Dressing/Undressing Upper body dressing   What is the patient wearing?: Pull over shirt    Upper body assist Assist Level: Supervision/Verbal cueing    Lower Body Dressing/Undressing Lower body dressing      What is the patient wearing?: Underwear/pull up, Pants     Lower body assist Assist for lower body dressing: Supervision/Verbal cueing     Toileting Toileting    Toileting assist Assist for toileting: Supervision/Verbal cueing     Transfers Chair/bed transfer  Transfers assist     Chair/bed transfer assist level: Supervision/Verbal cueing     Locomotion Ambulation   Ambulation assist      Assist level: Contact Guard/Touching assist Assistive device: No Device Max distance: 150'   Walk 10 feet activity   Assist     Assist level: Contact Guard/Touching assist Assistive device: No Device   Walk 50 feet activity   Assist    Assist level: Contact Guard/Touching assist Assistive device: No Device    Walk 150 feet activity   Assist    Assist level: Contact Guard/Touching assist Assistive device: No Device    Walk  10 feet on uneven surface  activity   Assist     Assist level: Minimal Assistance - Patient > 75%     Wheelchair     Assist Is the patient using a wheelchair?: Yes Type of Wheelchair: Manual    Wheelchair assist level: Dependent - Patient 0% Max wheelchair distance: 150'    Wheelchair 50 feet with 2 turns activity    Assist        Assist Level: Dependent - Patient 0%   Wheelchair 150 feet activity     Assist      Assist Level: Dependent - Patient 0%   Blood pressure 139/84, pulse 62, temperature 98.7 F (37.1 C), resp. rate 15, height 5\' 7"  (1.702 m), weight 72.1 kg, SpO2 100%.  Medical Problem List and Plan: 1. 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  Etiology suspected to be A-fib not on anticoagulation versus large vessel disease             -patient may shower             -ELOS/Goals: 10 to 14 days, supervision to min assist with PT, OT, SLP           D/C day Wednesday 10/16  Con't CIR PT, OT and SLP  Team conference today to finalize d/c plans   2.  Antithrombotics: -DVT/anticoagulation:  Pharmaceutical: Heparin initiated for anticoagulation. PLAN TO TRANSITION TO ELIQUIS 5 MG BID ONCE ABLE TO SWALLOW. 9/28 still NPO 10/1- per pharmacy change change to Eliquis- stopped ASA and Heparin and added Eliquis - but then realized wasn't clear- called pharmacy again- will reach out to Dr Pearlean Brownie and see if need ASA at all? 10/2- ASA stopped per Dr Pearlean Brownie 10/11: Eliquis Dced, heparin bridge pending PEG placement Monday-- Heparin supratherapeutic, HGB down from 14->12 and BUN increasing; h/h in AM, FOBT ordered, monitor for apparent s/s blood loss 10/12: HgB stable 11.9; heparin rate unchanged per pharmacy 10/14- Hb stable at 12.1- Plts down to 104k- if keeps dropping, will do more intervention 10/15- will need f/u with PCP- to check labs-              -antiplatelet therapy: Aspirin 325 mg daily 3. Pain Management: tylenol as needed  9/30- denies pain- con't regimen 4. Mood/Behavior/Sleep: Provide emotional support             -antipsychotic agents: N/A 5. Neuropsych/cognition: This patient is not capable of making decisions on his own behalf. 6. Skin/Wound Care: Routine skin checks 7. Fluids/Electrolytes/Nutrition: Routine and analysis with follow-up chemistries 8.  Dysphagia.  Currently NPO, Cortrak. Speech therapy follow-up.  9/27- pulled cortrak accidentally last night- out a few inches- per nursing- KUB looks OK- reviewed it- can use- mittens? Recheck modified barium swallow on Tuesday, October 1  10/1- pending MBSS  10/2- now on D3 nectar thick diet  10/5- Diet remains D1 with  nectar thick. Continue TF also  10/6 pt ate 25-50-65% yesterday, will reduce TF to 35cc/hr  10/8- will look into PEG  10/11- spoke to Ir_plan for PEG Monday  -MBSS done- still at D1 nectar thick liquids  10/14- PEG scheduled for today  10/15- got PEG_ d/w IR- can use PEG_ d/c'd Cortrak 9.  Hypertension.  Imdur 60 mg daily.  Monitor with increased mobility.  Long-term blood pressure goal normotensive  9/26- will change Imdur to immediate release- d/w pharmacy- changed  10/11- BP usually controlled- is 150s this  AM- however is unusual- will monitor trend  10/14- BP overall controlled has a few very slightly elevated- con't to monitor  10/15- BP controlled- con't regimen Vitals:   05/17/23 1956 05/18/23 0405  BP: 130/85 139/84  Pulse: 70 62  Resp: 15 15  Temp: 98.9 F (37.2 C) 98.7 F (37.1 C)  SpO2: 98% 100%    10.  Bradycardia into the 40s.  Coreg held.  Avoid negative chronotropic medications.   Monitor with increased mobility  9/30-10/1- doing slightly better- in 50s- not 40's  10/2-10/5 HR in 50s-60's - con't regimen - stable 10/12, 10/13 11.  Hyperlipidemia.  Pravachol 12.  CKD stage IIIa.  Follow-up chemistries  9/30- Cr 1.48- up from 1.30- BUN up to 29- will increase Free water to 250 cc q4 - don't want to increase further due to [facility]-- and see how goes in labs tomorrow-   10/1- BUN up to 34 and Cr 1.54 up from 1.48- will consult nutritionist  for free water changes-   10/2- spoke with nutritonist- will change TF's to night time only- maintain fluids, but decrease protein- also adding supplements if doesn't eat enough- per pt/wife eating really well- will check BMP in AM  10/3- Pt's food downgraded to D1 diet- also BUN up to 42 up from 34 and Cr stable at 1.55- will give low dose IVFs to try and help turn this Around- will do 75cc for 12 hours 5pm to 5am- and recheck in AM with BMP  10/4- pending labs this AM- pt doesn't have labs drawn yet- pending  10/5 improved BUN/Cr  yesterday--will increase free water thru tube today   10/6 pt ate better yesterday   -cut nocturnal TF in half   -continue increased H20 flushes   -labs tomorrow  10/7- BUN/Cr stable- however on water flushes as well- explained to pt will need to take double the amount of Nectar thick fluids, if remove Cortrak. Will d/w SLP and dietitian  10/8- still getting 300cc free water and I think pt needs PEG_ esp since they downgraded diet to D1 nectar thick again this AM- will d/w pt  10/12: FWF 300 ml Q4H; continue. Will get BMP with labs for heparin this evening --> BUN > Cr uptrending with HgB downtrending; repeat BMP in AM, concerning for GIB  10/13: HGB stable, vitals stable, FOBT pending but with improved BUN/Cr will continue current Tx   10/14- BUN 1.31 and BUN 30- Hb back up to 12.1- is less dry- so not sure why- pending FOBT  10/15- will go home on water flushes and can use Ensure, told wife- if he doesn't eat his meal- exlained to pt cannot eat another diet than what SLP wants him on- increased risk of pneumonia/aspiration.     Latest Ref Rng & Units 05/17/2023    6:05 AM 05/16/2023    7:20 AM 05/15/2023    4:45 PM  BMP  Glucose 70 - 99 mg/dL 981  191  98   BUN 8 - 23 mg/dL 30  33  42   Creatinine 0.61 - 1.24 mg/dL 4.78  2.95  6.21   Sodium 135 - 145 mmol/L 137  133  139   Potassium 3.5 - 5.1 mmol/L 4.5  4.0  4.6   Chloride 98 - 111 mmol/L 105  101  104   CO2 22 - 32 mmol/L 25  27  26    Calcium 8.9 - 10.3 mg/dL 9.2  8.9  9.1     13.  Diastolic congestive heart failure.  Monitor for any signs of fluid overload. 9/28- stable   9/30- weight stable- 10/1- no weight today- will d/w nursing   10/2- weight up slightly- will monitor for trend  10/3- 71.3 kg today- up 1 lb from yesterday-if goes above 73 kg, will give Lasix  10/5- Weight 71.2 kg this AM- stable  10/14-10/15 weight stable Filed Weights   05/16/23 0422 05/17/23 0500 05/18/23 0405  Weight: 72.3 kg 72.5 kg 72.1 kg    14.   History of tobacco/alcohol use/Cocaine.  Provide counseling 15.  Medical noncompliance.  Counseling 16.  Atrial fibrillation.  Hold Coreg due to bradycardia.  Continue ASA and heparin --> Eliquis --> 10/11 heparin bridge for PEG 10/15- Switching from Heparin gtt to Eliquis at noon today- d/w pharmacy- they will make the change  17. Constipation/bowel incontinence  9/26- will give sorbitol after therapy today  9/27- no results with sorbitol- will give another Sorbitol but 60cc and then soap suds enema if no results  10/1- cannot tell when having BM- has no control per wife- last went yesterday  10/4- LBM yesterday, but still has no control- we discussed doing suppository/bowel program- pt refused for now , but said he'd "think about it".   10/6- no bm since 10/3--add miralax and senokot-s at HS.   10/8-10/9 LBM yesterday- still having incontinence- pt doesn't want suppository-   10/10- LBM yesterday- still having incontinence- doesn't want bowel program  10/11- going daily per pt/wife- but cannot control it.   10/14- LBM 2 days ago- if doesn't go by tomorrow, will intervene  10/15- LBM las tnight- was continent.  18. Hyperkalemia  9/26- will recheck in AM- is 5.3- however was 4.1- so hesitant to give Lokelma without rechecking 9/27 - K+ 4.2- so likely hemolysis yesterday  10/6 k+ 4.5 10/4 Repeeat 10/12 - 4.6 -> 10/13 4.0 10/14- K_ 4.5 19. Diabetes- on Tfs  9/26- will add SSI, since A1c 6.9 and on Tf's- do q4 hours per protocol.   9/30-10/1 CBGs running well- might need to change if gets diet with MBSS today  10/2- change to TID with meals- and monitor  10/3-10/5 CBGs reasonable control--no changes  10/12-13 - mildly elevated, like d/t tube feeds, continue SSI  10/15- CBGs look good CBG (last 3)  Recent Labs    05/17/23 1716 05/17/23 2049 05/18/23 0628  GLUCAP 82 188* 158*      I spent a total of 52   minutes on total care today- >50% coordination of care- due to  D/w IR so can  use PEG now and d/c cortrak- also d/w pharmacy about Eliquis/Heparin gtt- also d/w team- team conference about progress as well as f/u on PEG- also spoke to 2 different nurses about education of PEG for pt/wife so can go home tomorrow.   LOS: 20 days A FACE TO FACE EVALUATION WAS PERFORMED  Nikkie Liming 05/18/2023, 8:55 AM

## 2023-05-18 NOTE — Progress Notes (Signed)
Physical Therapy Session Note  Patient Details  Name: Timothy [name] MRN: [id number] Date of Birth: Jun 18, 1956  Today's Date: 05/18/2023 PT Individual Time: 1005-1050 PT Individual Time Calculation (min): 45 min   Short Term Goals: Week 2:  PT Short Term Goal 1 (Week 2): STG's=LTG's due to ELOS PT Short Term Goal 1 - Progress (Week 2): Progressing toward goal  Skilled Therapeutic Interventions/Progress Updates: Patient supine in bed with wife present on entrance to room. Patient alert and agreeable to PT session.   Patient reported no pain at beginning of PT session.  Therapeutic Activity: Bed Mobility: Pt performed supine<>sit on EOB with modI (HOB elevated).  Transfers: Pt performed sit<>stand transfers throughout session with no AD and supervision for safety.   Gait Training:  Pt ambulated from room<>day room gym using no AD with supervision and PTA managing IV pole. Pt continues to ambulate hugging the R side of the hallway with one instance of lightly bumping into chair leg on R with VC to increase safety awareness to the R side.  Neuromuscular Re-ed: NMR facilitated during session with focus on proprioceptive feedback R LE/UE, balance and hand/eye coordination. - NuStep set at level 6 to increase proprioceptive feedback on R LE, and to maintain grip on R UE on handle. Pt performed 15 minutes with VC to maintain between 45 and 50 steps per minute. Pt did not require rest break until end. - Pt standing in front of hi/low mat with cues to toss bean bags to corn hole board (supervision for provided throughout). Pt use of R UE and required increased tactile cues to understand sequence (increase flexion at elbow, and bring UE past hips and to release bag where PTA hand is so that bag does not arc into air). Pt required increased time to grab bags with R UE due to decreased dexterity and grip. Pt performed 3 rounds with 3rd round being cued to use B UE's at same time with pt noted to have  increased coordination and distance thrown.  NMR performed for improvements in motor control and coordination, balance, sequencing, judgement, and self confidence/ efficacy in performing all aspects of mobility at highest level of independence.   Patient supine in bed at end of session with brakes locked, wife present, bed alarm set, and all needs within reach.      Therapy Documentation Precautions:  Precautions Precautions: Fall Precaution Comments: R inattention; SBP <180, cortrak, PEG tube Restrictions Weight Bearing Restrictions: No  Therapy/Group: Individual Therapy  Kalise Fickett PTA 05/18/2023, 12:43 PM

## 2023-05-18 NOTE — Progress Notes (Signed)
Return demonstration done with wife re: flushing of tube; how many times per day tube needs to be flushed;  amount of water ; type of water to be used emphasized not to use distilled water , no tap water but use spring water or drinking water to be bought from the store. Some supplies given to wife to bring home.no further questions noted.

## 2023-05-18 NOTE — Progress Notes (Signed)
Nutrition Follow-up  DOCUMENTATION CODES:  Severe malnutrition in context of acute illness/injury  INTERVENTION:  Continue full liquid diet as ordered per SLP Encourage good PO intake Provided education on PEG maintenance and care. Discussed signs of dehydration and how to monitor fluid status  NUTRITION DIAGNOSIS:   Severe Malnutrition related to acute illness as evidenced by moderate fat depletion, moderate muscle depletion, severe muscle depletion, percent weight loss (hernia repair in august with poor intake post op followed by admission for stroke). - remains applicable   GOAL:  Patient will meet greater than or equal to 90% of their needs - progressing, puree diet and PEG in place  MONITOR:  PO intake, Supplement acceptance, Labs, Weight trends, TF tolerance, I & O's, Skin  REASON FOR ASSESSMENT:  Consult Enteral/tube feeding initiation and management  ASSESSMENT:  67 y/o male with h/o CKD III, CHF, HLD, etoh abuse, GERD, inguinal hernia s/p repair with mesh 12/23 and s/p robotic assisted laparoscopic left inguinal herniorrhaphy with mesh 8/24 who is admitted with CIR after left MCA infarct with L M2 occlusion s/p thrombectomy on 9/18.  Pt resting in bed at the time of assessment. Wife at bedside. Pt to be discharged tomorrow and had PEG placed for hydration yesterday. Pt had diet advanced today and wife and pt report he ate well. Wife was given a list of foods to offer as suitable for puree which she plans to offer at. Pt will not be discharged with TF, was instructed to bolus an ensure if he does not eat well. Discussed different options of ensure which could be utilized and also discussed how to monitor hydration status with pt and wife. Handouts left at bedside.    Admit weight: 72 kg Current weight: 72.1 kg   Average Meal Intake: 10/12-10/14: 0-100% intake x 8 recorded meals of full liquid diet  Nutritionally Relevant Medications: Scheduled Meds:  NEPRO CARB STEADY   237 mL Oral BID BM   OSMOLITE 1.5 CAL  490 mL Per Tube Q24H   PROSource TF20  60 mL Per Tube Daily   folic acid  1 mg Oral Daily   free water  300 mL Per Tube Q4H   insulin aspart  0-9 Units Subcutaneous TID WC   multivitamin with minerals  1 tablet Oral Daily   polyethylene glycol  17 g Oral Daily   pravastatin  40 mg Oral Daily   senna-docusate  2 tablet Oral QHS   thiamine  100 mg Oral Daily   PRN Meds: sorbitol  Labs Reviewed: BUN 30, creatinine 1.31 CBG ranges from 82-188 mg/dL over the last 24 hours HgbA1c 6.3% (9/18)  NUTRITION - FOCUSED PHYSICAL EXAM: Flowsheet Row Most Recent Value  Orbital Region Moderate depletion  Upper Arm Region Moderate depletion  Thoracic and Lumbar Region Moderate depletion  Buccal Region Moderate depletion  Temple Region Severe depletion  Clavicle Bone Region Moderate depletion  Clavicle and Acromion Bone Region Moderate depletion  Scapular Bone Region Moderate depletion  Dorsal Hand Mild depletion  Patellar Region Severe depletion  Anterior Thigh Region Severe depletion  Posterior Calf Region Moderate depletion  Edema (RD Assessment) None    Diet Order:   Diet Order             DIET - DYS 1 Room service appropriate? Yes; Fluid consistency: Nectar Thick  Diet effective now                   EDUCATION NEEDS:   Education needs have  been addressed  Skin:  Skin Assessment: Reviewed RN Assessment  Last BM:  10/11  Height:  Ht Readings from Last 1 Encounters:  04/28/23 5\' 7"  (1.702 m)    Weight:  Wt Readings from Last 1 Encounters:  05/18/23 72.1 kg    Ideal Body Weight:  67.3 kg  BMI:  Body mass index is 24.9 kg/m.  Estimated Nutritional Needs:  Kcal:  1900-2200 kcal Protein:  95-110 g Fluid:  1.8-2L/d    Greig Castilla, RD, LDN Clinical Dietitian RD pager # available in [facility]  After hours/weekend pager # available in [facility]

## 2023-05-18 NOTE — Progress Notes (Signed)
Referring Physician(s): Dr Theodora Blow  Supervising Physician: Marliss Coots  Patient Status:  [facility] - In-pt  Chief Complaint:  Dysphagia  Subjective:  G tube placed in IR 10/14 Intact Clean and dry T tacs in place May use now  Allergies: Patient has no known allergies.  Medications: Prior to Admission medications   Medication Sig Start Date End Date Taking? Authorizing Provider  acetaminophen (TYLENOL) 325 MG tablet Take 2 tablets (650 mg total) by mouth every 4 (four) hours as needed for mild pain (or temp > 37.5 C (99.5 F)). 05/10/23   Angiulli, Mcarthur Rossetti, PA-C  amLODipine (NORVASC) 10 MG tablet Take 1 tablet (10 mg total) by mouth daily for 30 days. 12/24/18 04/21/23  Arrien, York Ram, MD  apixaban (ELIQUIS) 5 MG TABS tablet Take 1 tablet (5 mg total) by mouth 2 (two) times daily. 05/18/23   Love, Evlyn Kanner, PA-C  carvedilol (COREG) 25 MG tablet Take 1 tablet (25 mg total) by mouth 2 (two) times daily with a meal for 30 days. 12/23/18 04/21/23  Arrien, York Ram, MD  cholecalciferol (VITAMIN D3) 25 MCG (1000 UNIT) tablet Take 1,000 Units by mouth daily.    [provider]  folic acid (FOLVITE) 1 MG tablet Take 1 tablet (1 mg total) by mouth daily. 05/19/23   Love, Evlyn Kanner, PA-C  hydrALAZINE (APRESOLINE) 50 MG tablet Take 1 tablet (50 mg total) by mouth 3 (three) times daily. 07/04/21 04/21/23  O'NealRonnald Ramp, MD  hydrocerin (EUCERIN) CREA Apply 1 Application topically 2 (two) times daily. 05/18/23   Love, Evlyn Kanner, PA-C  isosorbide dinitrate (ISORDIL) 20 MG tablet Take 1 tablet (20 mg total) by mouth 3 (three) times daily. 05/18/23   Love, Evlyn Kanner, PA-C  isosorbide mononitrate (IMDUR) 60 MG 24 hr tablet TAKE ONE TABLET BY MOUTH ONCE DAILY. 04/02/22   Jake Bathe, MD  Multiple Vitamin (MULTIVITAMIN WITH MINERALS) TABS tablet Take 1 tablet by mouth daily. 05/19/23   Love, Evlyn Kanner, PA-C  polyethylene glycol powder (GLYCOLAX/MIRALAX) 17 GM/SCOOP powder  Dissolve 1 capful (17 g) in water and take by mouth daily. 05/19/23   Love, Evlyn Kanner, PA-C  pravastatin (PRAVACHOL) 40 MG tablet Take 1 tablet (40 mg total) by mouth daily. 05/18/23   Love, Evlyn Kanner, PA-C  senna-docusate (SENOKOT-S) 8.6-50 MG tablet Take 2 tablets by mouth at bedtime. 05/18/23   Love, Evlyn Kanner, PA-C  thiamine (VITAMIN B1) 100 MG tablet Take 1 tablet (100 mg total) by mouth daily. 05/19/23   Love, Evlyn Kanner, PA-C  Water For Irrigation, Sterile (FREE WATER) SOLN Place 400 mLs into feeding tube 4 (four) times daily - after meals and at bedtime. Use filtered water (NOT DISTILLED, TAP OR WELL WATER) 05/18/23   Love, Evlyn Kanner, PA-C     Vital Signs: BP 139/84 (BP Location: Left Arm)   Pulse 62   Temp 98.7 F (37.1 C)   Resp 15   Ht 5\' 7"  (1.702 m)   Wt 158 lb 15.2 oz (72.1 kg)   SpO2 100%   BMI 24.90 kg/m   Physical Exam Vitals reviewed.  Skin:    General: Skin is warm.     Comments: Site is c/d/I No bleeding T tacs in place     Imaging: IR GASTROSTOMY TUBE MOD SED  Result Date: 05/17/2023 INDICATION: Dysphagia.  CVA. EXAM: PERCUTANEOUS GASTROSTOMY TUBE PLACEMENT COMPARISON:  CT abdomen, 05/13/2023. FL swallow evaluation, 05/13/2023. MEDICATIONS: Ancef 2 gm IV; Antibiotics were administered within  1 hour of the procedure. CONTRAST:  15 mL of Isovue 300 administered into the gastric lumen. ANESTHESIA/SEDATION: Moderate (conscious) sedation was employed during this procedure. A total of Versed 2 mg and Fentanyl 100 mcg was administered intravenously. Moderate Sedation Time: 13 minutes. The patient's level of consciousness and vital signs were monitored continuously by radiology nursing throughout the procedure under my direct supervision. FLUOROSCOPY TIME:  Fluoroscopic dose; 2 mGy COMPLICATIONS: None immediate. PROCEDURE: Informed written consent was obtained from the patient and/or patient's representative following explanation of the procedure, risks, benefits and  alternatives. A time out was performed prior to the initiation of the procedure. Maximal barrier sterile technique utilized including caps, mask, sterile gowns, sterile gloves, large sterile drape, hand hygiene and Betadine prep. The left upper quadrant was sterilely prepped and draped. A oral gastric catheter was inserted into the stomach under fluoroscopy. The existing nasogastric feeding tube was removed. The left costal margin and barium opacified transverse colon were identified and avoided. Air was injected into the stomach for insufflation and visualization under fluoroscopy. Under sterile conditions and local anesthesia, 2T tacks were utilized to pexy the anterior aspect of the stomach against the ventral abdominal wall. Contrast injection confirmed appropriate positioning of each of the T tacks. An incision was made between the T tacks and a 17 gauge trocar needle was utilized to access the stomach. Needle position was confirmed within the stomach with aspiration of air and injection of a small amount of contrast. A stiff Glidewire was advanced into the gastric lumen and under intermittent fluoroscopic guidance, the access needle was exchanged for a telescoping peel-away sheath, ultimately allowing placement of an 18 Fr balloon retention gastrostomy tube. The retention balloon was insufflated with a mixture of dilute saline and contrast and pulled taut against the anterior wall of the stomach. The external disc was cinched. Contrast injection confirms positioning within the stomach. Several spot radiographic images were obtained in various obliquities for documentation. The patient tolerated procedure well without immediate post procedural complication. FINDINGS: After successful fluoroscopic guided placement, the gastrostomy tube is appropriately positioned with internal retention balloon against the ventral aspect of the gastric lumen. IMPRESSION: Successful fluoroscopic-guided percutaneous insertion of  an 18 Fr balloon-retention gastrostomy tube. The gastrostomy may be used immediately for medication administration and in 4 hrs for the initiation of feeds. RECOMMENDATIONS: The patient will return to Vascular Interventional Radiology (VIR) for routine feeding tube evaluation and exchange in 6 months. Roanna Banning, MD [facility] Electronically Signed   By: Roanna Banning M.D.   On: 05/17/2023 16:50    Labs:  CBC: Recent Labs    05/10/23 0612 05/15/23 1853 05/16/23 0720 05/17/23 0605  WBC 5.2 5.1 4.5 4.8  HGB 14.0 12.4* 11.9* 12.1*  HCT 42.2 38.1* 36.7* 36.7*  PLT 142* 117* 113* 104*    COAGS: Recent Labs    04/21/23 1140 05/15/23 1853 05/16/23 0720 05/16/23 1555 05/17/23 1140 05/17/23 2217 05/18/23 0530  INR 1.1  --   --   --  1.1  --   --   APTT 38*   < > 41* 86*  --  72* 71*   < > = values in this interval not displayed.    BMP: Recent Labs    05/11/23 0520 05/15/23 1645 05/16/23 0720 05/17/23 0605  NA 136 139 133* 137  K 4.8 4.6 4.0 4.5  CL 100 104 101 105  CO2 28 26 27 25   GLUCOSE 142* 98 123*  109*  BUN 34* 42* 33* 30*  CALCIUM 9.1 9.1 8.9 9.2  CREATININE 1.52* 1.67* 1.21 1.31*  GFRNONAA 49* 44* >60 59*    LIVER FUNCTION TESTS: Recent Labs    04/29/23 0650 05/03/23 0647 05/10/23 0612 05/17/23 0605  BILITOT 0.8 0.8 0.6 0.9  AST 18 37 21 17  ALT 16 32 21 16  ALKPHOS 54 60 54 52  PROT 5.7* 6.6 6.2* 6.2*  ALBUMIN 3.1* 3.6 3.3* 3.3*    Assessment and Plan:  May use now IR will return to remove T tacs ~10 days  Electronically Signed: Robet Leu, PA-C 05/18/2023, 12:48 PM   I spent a total of 15 Minutes at the the patient's bedside AND on the patient's hospital floor or unit, greater than 50% of which was counseling/coordinating care for perc g tube

## 2023-05-18 NOTE — Plan of Care (Signed)
Problem: RH Balance Goal: LTG: Patient will maintain dynamic sitting balance (OT) Description: LTG:  Patient will maintain dynamic sitting balance with assistance during activities of daily living (OT) Outcome: Completed/Met Flowsheets (Taken 04/30/2023 1436 by Doe, Merlene Laughter, OT) LTG: Pt will maintain dynamic sitting balance during ADLs with: Independent Goal: LTG Patient will maintain dynamic standing with ADLs (OT) Description: LTG:  Patient will maintain dynamic standing balance with assist during activities of daily living (OT)  Outcome: Completed/Met Flowsheets (Taken 04/30/2023 1436 by Doe, Merlene Laughter, OT) LTG: Pt will maintain dynamic standing balance during ADLs with: Supervision/Verbal cueing   Problem: Sit to Stand Goal: LTG:  Patient will perform sit to stand in prep for activites of daily living with assistance level (OT) Description: LTG:  Patient will perform sit to stand in prep for activites of daily living with assistance level (OT) Outcome: Completed/Met Flowsheets (Taken 04/30/2023 1436 by Doe, Merlene Laughter, OT) LTG: PT will perform sit to stand in prep for activites of daily living with assistance level: Supervision/Verbal cueing   Problem: RH Eating Goal: LTG Patient will perform eating w/assist, cues/equip (OT) Description: LTG: Patient will perform eating with assist, with/without cues using equipment (OT) Outcome: Completed/Met Flowsheets (Taken 04/30/2023 1436 by Doe, Merlene Laughter, OT) LTG: Pt will perform eating with assistance level of: Supervision/Verbal cueing   Problem: RH Grooming Goal: LTG Patient will perform grooming w/assist,cues/equip (OT) Description: LTG: Patient will perform grooming with assist, with/without cues using equipment (OT) Outcome: Completed/Met Flowsheets (Taken 04/30/2023 1436 by Doe, Merlene Laughter, OT) LTG: Pt will perform grooming with assistance level of: Supervision/Verbal cueing   Problem: RH Bathing Goal: LTG Patient will bathe  all body parts with assist levels (OT) Description: LTG: Patient will bathe all body parts with assist levels (OT) Outcome: Completed/Met Flowsheets (Taken 04/30/2023 1436 by Doe, Merlene Laughter, OT) LTG: Pt will perform bathing with assistance level/cueing: Supervision/Verbal cueing   Problem: RH Dressing Goal: LTG Patient will perform upper body dressing (OT) Description: LTG Patient will perform upper body dressing with assist, with/without cues (OT). Outcome: Completed/Met Flowsheets (Taken 04/30/2023 1436 by Doe, Merlene Laughter, OT) LTG: Pt will perform upper body dressing with assistance level of: Supervision/Verbal cueing Goal: LTG Patient will perform lower body dressing w/assist (OT) Description: LTG: Patient will perform lower body dressing with assist, with/without cues in positioning using equipment (OT) Outcome: Completed/Met Flowsheets (Taken 04/30/2023 1436 by Doe, Merlene Laughter, OT) LTG: Pt will perform lower body dressing with assistance level of: Supervision/Verbal cueing   Problem: RH Toileting Goal: LTG Patient will perform toileting task (3/3 steps) with assistance level (OT) Description: LTG: Patient will perform toileting task (3/3 steps) with assistance level (OT)  Outcome: Completed/Met Flowsheets (Taken 04/30/2023 1436 by Doe, Merlene Laughter, OT) LTG: Pt will perform toileting task (3/3 steps) with assistance level: Supervision/Verbal cueing   Problem: RH Functional Use of Upper Extremity Goal: LTG Patient will use RT/LT upper extremity as a (OT) Description: LTG: Patient will use right/left upper extremity as a stabilizer/gross assist/diminished/nondominant/dominant level with assist, with/without cues during functional activity (OT) Outcome: Completed/Met Flowsheets (Taken 04/30/2023 1436 by Doe, Merlene Laughter, OT) LTG: Use of upper extremity in functional activities: RUE as diminished level   Problem: RH Toilet Transfers Goal: LTG Patient will perform toilet transfers  w/assist (OT) Description: LTG: Patient will perform toilet transfers with assist, with/without cues using equipment (OT) Outcome: Completed/Met Flowsheets (Taken 04/30/2023 1437 by Mal Amabile, OT) LTG: Pt will perform toilet transfers with assistance level of: Supervision/Verbal  cueing   Problem: RH Tub/Shower Transfers Goal: LTG Patient will perform tub/shower transfers w/assist (OT) Description: LTG: Patient will perform tub/shower transfers with assist, with/without cues using equipment (OT) Outcome: Completed/Met Flowsheets (Taken 04/30/2023 1437 by Doe, Merlene Laughter, OT) LTG: Pt will perform tub/shower stall transfers with assistance level of: Supervision/Verbal cueing   Problem: RH Awareness Goal: LTG: Patient will demonstrate awareness during functional activites type of (OT) Description: LTG: Patient will demonstrate awareness during functional activites type of (OT) Outcome: Completed/Met Flowsheets (Taken 04/30/2023 1437 by Doe, Merlene Laughter, OT) LTG: Patient will demonstrate awareness during functional activites type of (OT): Supervision

## 2023-05-18 NOTE — Progress Notes (Signed)
Occupational Therapy Discharge Summary  Patient Details  Name: Timothy [name] MRN: [id number] Date of Birth: 05/28/1956  Date of Discharge from OT service:May 18, 2023  Today's Date: 05/18/2023 OT Individual Time: 1100-1127 OT Individual Time Calculation (min): 27 min    Patient has met 13 of 13 long term goals due to improved activity tolerance, improved balance, postural control, ability to compensate for deficits, functional use of  RIGHT upper and RIGHT lower extremity, improved attention, improved awareness, and improved coordination.  Patient to discharge at overall Supervision level d/t decreased cognition.  Patient's care partner is independent to provide the necessary cognitive assistance at discharge.    Reasons goals not met: N/A  Recommendation:  Patient will benefit from ongoing skilled OT services in outpatient setting to continue to advance functional skills in the area of BADL and Reduce care partner burden.  Equipment: No equipment provided.  Discussion of private purchasing TTB.  Reasons for discharge: treatment goals met and discharge from hospital  Patient/family agrees with progress made and goals achieved: Yes  OT Discharge Precautions/Restrictions  Precautions Precautions: Fall Precaution Comments: R inattention; SBP <180, cortrak, PEG tube Restrictions Weight Bearing Restrictions: No Pain Pain Assessment Pain Scale: 0-10 Pain Score: 0-No pain ADL ADL Eating: Supervision/safety Where Assessed-Eating: Chair Grooming: Supervision/safety Where Assessed-Grooming: Standing at sink Upper Body Bathing: Supervision/safety Where Assessed-Upper Body Bathing: Shower Lower Body Bathing: Moderate assistance Where Assessed-Lower Body Bathing: Shower Upper Body Dressing: Supervision/safety Where Assessed-Upper Body Dressing: Edge of bed Lower Body Dressing: Supervision/safety Where Assessed-Lower Body Dressing: Sitting at sink, Standing at  sink Toileting: Supervision/safety Where Assessed-Toileting: Teacher, adult education: Close supervision Toilet Transfer Method: Insurance claims handler: Close supervison Web designer Method: Ship broker: Insurance underwriter: Close supervision Film/video editor Method: Designer, industrial/product: Emergency planning/management officer ADL Comments: pt requiring VC for use of RUE Vision Baseline Vision/History: 0 No visual deficits Patient Visual Report: No change from baseline Vision Assessment?: No apparent visual deficits Perception  Perception: Impaired Perception-Other Comments: RUE Praxis Praxis: Impaired Praxis Impairment Details: Motor planning Praxis-Other Comments: RUE Cognition Cognition Overall Cognitive Status: Impaired/Different from baseline Arousal/Alertness: Awake/alert Orientation Level: Person;Place;Situation Memory: Impaired Memory Impairment: Decreased recall of new information;Decreased short term memory Sustained Attention: Appears intact Awareness: Impaired Awareness Impairment: Emergent impairment Problem Solving: Impaired Problem Solving Impairment: Functional complex;Verbal complex Safety/Judgment: Impaired Comments: inattentive to R side Brief Interview for Mental Status (BIMS) Repetition of Three Words (First Attempt): 3 Temporal Orientation: Year: Nonsensical Temporal Orientation: Month: Missed by 6 days to 1 month (required VC) Temporal Orientation: Day: Incorrect Recall: "Sock": Yes, after cueing ("something to wear") Recall: "Blue": Yes, after cueing ("a color") Recall: "Bed": No, could not recall BIMS Summary Score: 6 Sensation Sensation Light Touch: Appears Intact Hot/Cold: Appears Intact Proprioception: Impaired by gross assessment Proprioception Impaired Details: Impaired RUE;Impaired RLE Stereognosis: Not tested Coordination Gross Motor Movements are Fluid and Coordinated:  No Fine Motor Movements are Fluid and Coordinated: No Coordination and Movement Description: R ataxia, increased impairement in RUE Finger Nose Finger Test: R UE dysmetria Heel Shin Test: R LE ataxia Motor  Motor Motor: Ataxia Motor - Skilled Clinical Observations: R ataxia Motor - Discharge Observations: Rt ataxia Mobility  Bed Mobility Bed Mobility: Rolling Right;Rolling Left;Supine to Sit;Sit to Supine Rolling Right: Supervision/verbal cueing Rolling Left: Supervision/Verbal cueing Supine to Sit: Supervision/Verbal cueing Sit to Supine: Supervision/Verbal cueing Transfers Sit to Stand: Supervision/Verbal cueing Stand to Sit: Supervision/Verbal cueing  Trunk/Postural Assessment  Cervical Assessment Cervical  Assessment: Within Functional Limits Thoracic Assessment Thoracic Assessment: Within Functional Limits Lumbar Assessment Lumbar Assessment: Within Functional Limits Postural Control Postural Control: Within Functional Limits  Balance Balance Balance Assessed: Yes Static Sitting Balance Static Sitting - Balance Support: Feet supported Static Sitting - Level of Assistance: 5: Stand by assistance Dynamic Sitting Balance Dynamic Sitting - Balance Support: Feet supported Dynamic Sitting - Level of Assistance: 5: Stand by assistance Static Standing Balance Static Standing - Balance Support: During functional activity Static Standing - Level of Assistance: 5: Stand by assistance Dynamic Standing Balance Dynamic Standing - Balance Support: During functional activity Dynamic Standing - Level of Assistance: 5: Stand by assistance Extremity/Trunk Assessment RUE Assessment RUE Assessment: Exceptions to [facility] Active Range of Motion (AROM) Comments: WFL General Strength Comments: impacted by inattention, decreased coordination and 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 Skin check from PEG tube, in tact. Wife present for session.  Pain: no pain reported   Other Treatments: Pt, wife and OT discussed important D/C concerns. Wife feel as if she needs further education for PEG tube. NT made aware for extra education. OT discussed private purchase of TTB for shower use. OT educated not to get PEG dressing wet and cover during showers. MMT and ROM performed for D/C, WFL ROM and 5/5 MMT. Pt still displaying decreased coordination with RUE.    Pt supine in bed with bed alarm activated, 2 bed rails up, call light within reach and 4Ps assessed.   Velia Meyer, OTD, OTR/L 05/18/2023, 4:05 PM

## 2023-05-18 NOTE — Progress Notes (Signed)
PHARMACY - ANTICOAGULATION CONSULT NOTE  Pharmacy Consult for heparin transition back to apixaban Indication: atrial fibrillation and stroke  Labs: Recent Labs    05/15/23 1645 05/15/23 1853 05/15/23 1853 05/16/23 0720 05/16/23 1555 05/17/23 0605 05/17/23 1140 05/17/23 2217 05/18/23 0530  HGB  --  12.4*   < > 11.9*  --  12.1*  --   --   --   HCT  --  38.1*  --  36.7*  --  36.7*  --   --   --   PLT  --  117*  --  113*  --  104*  --   --   --   APTT  --  70*   < > 41* 86*  --   --  72* 71*  LABPROT  --   --   --   --   --   --  14.8  --   --   INR  --   --   --   --   --   --  1.1  --   --   HEPARINUNFRC  --  >1.10*   < > 0.92*  --   --   --  0.40 0.31  CREATININE 1.67*  --   --  1.21  --  1.31*  --   --   --    < > = values in this interval not displayed.    Assessment/Plan:  67yo male therapeutic on heparin after resuming s/p PEG placement. Will continue infusion at current rate of 1050 units/hr and confirm stable with am labs.  10/15 AM update Current aPTT wnl He will transition back to apixaban 5 mg per tube bid starting 10/15 at 12:00 Heparin infusion will stop at the same time.  Thank you  Greta Doom BS, PharmD, BCPS Clinical Pharmacist 05/18/2023 8:23 AM  Contact: [phone] after 3 PM  "Be curious, not judgmental..." -Debbora Dus

## 2023-05-18 NOTE — Progress Notes (Signed)
Physical Therapy Session Note  Patient Details  Name: Timothy [name] MRN: [id number] Date of Birth: 05/26/1956  Today's Date: 05/18/2023 PT Individual Time: 1300-1330 PT Individual Time Calculation (min): 30 min   Short Term Goals: Week 3:  PT Short Term Goal 1 (Week 3): STG's=LTG's due to ELOS  Skilled Therapeutic Interventions/Progress Updates:    pt received in bed and agreeable to therapy. No complaint of pain.  Session focused on gait and dynamic balance. Pt played basketball for BUE tasks, chasing ball for reactive balance, retrieving from floor, etc. With CGA-supervision. Pt ambulated with tidal tank, both holding static, over head, and OH press+chest press combination. Pt returned to bed after session and was left with all needs in reach and alarm active.   Therapy Documentation Precautions:  Precautions Precautions: Fall Precaution Comments: R inattention; SBP <180, cortrak, PEG tube Restrictions Weight Bearing Restrictions: No General:       Therapy/Group: Individual Therapy  Juluis Rainier 05/18/2023, 3:54 PM

## 2023-05-18 NOTE — Progress Notes (Signed)
Cortrak discontinued per order.

## 2023-05-18 NOTE — Patient Care Conference (Signed)
Inpatient RehabilitationTeam Conference and Plan of Care Update Date: 05/18/2023   Time: 11:37 AM    Patient Name: Timothy [name]      Medical Record Number: [id number]  Date of Birth: 05/02/1956 Sex: Male         Room/Bed: 4W03C/4W03C-01 Payor Info: Payor: [employer] MEDICARE / Plan: [employer] MEDICARE CHOICE PPO / Product Type: *No Product type* /    Admit Date/Time:  04/28/2023  3:46 PM  Primary Diagnosis:  Left middle cerebral artery stroke [facility])  Hospital Problems: Principal Problem:   Left middle cerebral artery stroke (HCC) Active Problems:   Hx of atrial flutter   Chronic systolic heart failure (HCC)   Chronic kidney disease, stage 3a (HCC)   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 Genice Rouge Social Worker Present: Dossie Der, LCSW Nurse Present: Vedia Pereyra, RN PT Present: [name], PT OT Present: Velia Meyer, OT SLP Present: Feliberto Gottron, SLP PPS Coordinator present : Fae Pippin, SLP     Current Status/Progress Goal Weekly Team Focus  Bowel/Bladder   Continent   remain continent   bowel and bladder training    Swallow/Nutrition/ Hydration   Currently consuming Dys1/NTL diet with water ONLY via Provale cup between meals with staff present, after oral care   continued use of safe swallowing strategies, diet upgrade of solids  continued implementation of safe swallowing strategies, family education    ADL's   SBA overall for all ADLs   SBA overall   grad day 10/14    Mobility   supervision all   supervision  dynamic gait and balance    Communication   supervisionA for basic yes/no questions, min-modA for word finding with basic environmental objects, modA for auditory comprehension  overall   minA   improve auditory comprehension and word finding/naming    Safety/Cognition/ Behavioral Observations  attention improving, supervision-minA   Supervision   sustianed attention    Pain   denies pain   pain free   deep breathing exercises    Skin   skin intact   free from breakdown  turning and positioning every 2 hours      Discharge Planning:  Peg placed yesterday-wife here and been trained on his care-can only provide distant supervision due to own health issues. Home health arranged if needs tube feeding not covered due to can eat.   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 Continent of bowel and bladder with occasional accidents. Denies pain. Sleeps okay. Skin intact. PEG placed for additional hydration.  Okay to use per IR. Tolerating D1 diet with nectar thick liquids. MBS completed last week and aspirated on thins.   Patient on target to meet rehab goals: yes, meeting goals with discharge 05/19/23  *See Care Plan and progress notes for long and short-term goals.   Revisions to Treatment Plan:  Diet upgraded to D1. Cortrak out. Heparin stopped and Eliquis started. Water protocol. Monitor labs/VS Teaching Needs: Medications, textures for diet/liquids, hydration teaching via PEG tube, safety, self care, gait/transfer training, etc.   Current Barriers to Discharge: Decreased caregiver support and Incontinence  Possible Resolutions to Barriers: Family education Maintain appropriate diet and hydration Regain continence through time toileting.  Order recommended DME      Medical Summary Current Status: Cortrak out- now using  PEG this AM- usually continent  Barriers to Discharge: Inadequate Nutritional Intake  Barriers to Discharge Comments: MBS did worse with thin liquids- on water protocol; tolerating D1 still pockets and impuslive- aphsaia and PEG limiting Possible Resolutions to [employer] Focus: PEG placed-  COrtrak  removed- d/c tomrrow   Continued Need for Acute Rehabilitation Level of Care: The patient requires daily medical management by a physician with specialized training in physical medicine and rehabilitation for the following reasons: Direction of a multidisciplinary physical rehabilitation program to maximize functional independence : Yes Medical management of patient stability for increased activity during participation in an intensive rehabilitation regime.: Yes Analysis of laboratory values and/or radiology reports with any subsequent need for medication adjustment and/or medical intervention. : Yes   I attest that I was present, lead the team conference, and concur with the assessment and plan of the team.   Jearld Adjutant 05/18/2023, 3:31 PM

## 2023-05-18 NOTE — Progress Notes (Signed)
Inpatient Rehabilitation Discharge Medication Review by a Pharmacist  A complete drug regimen review was completed for this patient to identify any potential clinically significant medication issues.  High Risk Drug Classes Is patient taking? Indication by Medication  Antipsychotic No   Anticoagulant Yes Apixaban- AF  Antibiotic No   Opioid No   Antiplatelet No   Hypoglycemics/insulin No   Vasoactive Medication Yes Isordil- HTN  Chemotherapy No   Other Yes Pravachol- HLD      Type of Medication Issue Identified Description of Issue Recommendation(s)  Drug Interaction(s) (clinically significant)     Duplicate Therapy     Allergy     No Medication Administration End Date     Incorrect Dose     Additional Drug Therapy Needed     Significant med changes from prior encounter (inform family/care partners about these prior to discharge).    Other       Clinically significant medication issues were identified that warrant physician communication and completion of prescribed/recommended actions by midnight of the next day:  No   Time spent performing this drug regimen review (minutes):  30   Jestina Stephani BS, PharmD, BCPS Clinical Pharmacist 05/18/2023 11:07 AM  Contact: [phone] after 3 PM  "Be curious, not judgmental..." -Debbora Dus

## 2023-05-18 NOTE — Plan of Care (Signed)
  Problem: RH Swallowing Goal: LTG Patient will consume least restrictive diet using compensatory strategies with assistance (SLP) Description: LTG:  Patient will consume least restrictive diet using compensatory strategies with assistance (SLP) Outcome: Not Met d/t severity of deficits   Problem: RH Expression Communication Goal: LTG Patient will increase speech intelligibility (SLP) Description: LTG: Patient will increase speech intelligibility at word/phrase/conversation level with cues, % of the time (SLP) Outcome: Not Met d/t severity of deficits Goal: LTG Patient will increase word finding of common (SLP) Description: LTG:  Patient will increase word finding of common objects/daily info/abstract thoughts with cues using compensatory strategies (SLP). Outcome: Not Met d/t severity of deficits   Problem: RH Attention Goal: LTG Patient will demonstrate this level of attention during functional activites (SLP) Description: LTG:  Patient will will demonstrate this level of attention during functional activites (SLP) Outcome: Not Met d/t severity of deficits   Problem: RH Swallowing Goal: LTG Patient will participate in dysphagia therapy to increase swallow function with assistance (SLP) Description: LTG:  Patient will participate in dysphagia therapy to increase swallow function with assistance (SLP) Outcome: Completed/Met Goal: LTG Pt will demonstrate functional change in swallow as evidenced by bedside/clinical objective assessment (SLP) Description: LTG: Patient will demonstrate functional change in swallow as evidenced by bedside/clinical objective assessment (SLP) Outcome: Completed/Met   Problem: RH Comprehension Communication Goal: LTG Patient will comprehend basic/complex auditory (SLP) Description: LTG: Patient will comprehend basic/complex auditory information with cues (SLP). Outcome: Completed/Met   Problem: RH Expression Communication Goal: LTG Patient will verbally  express basic/complex needs(SLP) Description: LTG:  Patient will verbally express basic/complex needs, wants or ideas with cues  (SLP) Outcome: Completed/Met

## 2023-05-18 NOTE — Progress Notes (Signed)
Inpatient Rehabilitation Care Coordinator Discharge Note   Patient Details  Name: Timothy [name] MRN: [id number] Date of Birth: 08/23/1955   Discharge location: HOME WITH WIFE AND OTHER FAMILY MEMBERS TO ASSIST  Length of Stay: 21 DAYS  Discharge activity level: SBA DUE TO COGNITION  Home/community participation: ACTIVE  Patient response WU:[id number] Literacy - How often do you need to have someone help you when you read instructions, pamphlets, or other written material from your doctor or pharmacy?: Never  Patient response YN:WGNFAO Isolation - How often do you feel lonely or isolated from those around you?: Never  Services provided included: MD, RD, PT, OT, SLP, RN, CM, TR, Pharmacy, Neuropsych, SW  Financial Services:  Field seismologist Utilized: Scientific laboratory technician MEDICARE  Choices offered to/list presented to: PT AND WIFE  Follow-up services arranged:  Home Health, Patient/Family has no preference for HH/DME agencies Home Health Agency: [facility]  PT  OT  RN  SP NO EQUIPMENT NEEDS. PEG FOR HYDRATION        Patient response to transportation need: Is the patient able to respond to transportation needs?: Yes In the past 12 months, has lack of transportation kept you from medical appointments or from getting medications?: No In the past 12 months, has lack of transportation kept you from meetings, work, or from getting things needed for daily living?: No   Patient/Family verbalized understanding of follow-up arrangements:  Yes  Individual responsible for coordination of the follow-up plan: JUDY-WIFE [phone]  Confirmed correct DME delivered: Lucy Chris 05/18/2023    Comments (or additional information):WIFE STAYED HERE WITH PT AND WENT THROUGH MULTIPLE DAYS OF THERAPY WITH HUSBAND. DID HANDS ON FOR THE PEG AWARE OF HUSBAND;S CUEING NEEDS.   Summary of Stay    Date/Time Discharge Planning CSW  05/18/23 0825 Peg placed yesterday-wife  here and been trained on his care-can only provide distant supervision due to own health issues. Home health arranged if needs tube feeding not covered due to can eat. RGD  05/11/23 0820 Wife stays here and see's husband in therapies his main barrier is his swallowing and speech issues. At a supervision-SBA level fot mobility. Home health in place awaiting equipment recommendations RGD  05/04/23 0839 HOme with wife who has health issues-prior CVA uses rollator/cane. Other family members to assist. Pt hopeful he does well on his MBS today. Neuro-psych seeing RGD       Crisanto Nied, Lemar Livings

## 2023-05-18 NOTE — Progress Notes (Signed)
Speech Language Pathology Discharge Summary  Patient Details  Name: Timothy [name] MRN: [id number] Date of Birth: Oct 19, 1955  Date of Discharge from SLP service:May 18, 2023  Today's Date: 05/18/2023 SLP Individual Time: 1345-1445 SLP Individual Time Calculation (min): 60 min  Skilled Therapeutic Interventions:  SLP conducted skilled therapy session targeting dysphagia management and communication goals. SLP administered post-therapy testing to assess current level of receptive/expressive language function with patient scoring 44.16 on bedside WAB, scoring within the range of Broca's aphasia. Patient's most significant deficits remain in the areas of content, fluency, following commands, and naming with relative strength in the area of repetition and auditory comprehension. Patient is overall mod assist for basic communication. During dysphagia management tasks, patient observed to shew Dys3 solids effectively, though prolonged mastication observed, and left buccal pocketing minimized compared to previous targeted session. Recommend continuation of current diet (Dys1/NTLs) throughout remainder of CIR stay, though suspect patient will soon be appropriate for diet upgrade at next venue of care. SLP and wife discussed diet parameters as well as mechanics of thickening liquids and pureeing solids. Provided family with sample thickener packets. Patient left in bed with call bell in reach and bed alarm set. Patient is appropriate for discharge from this venue of care given overall improvement in medical status. See discharge summary below.   Patient has met 4 of 8 long term goals.  Patient to discharge at overall Min;Mod (min for swallowing, mod for overall basic communication) level.  Reasons goals not met: severity of deficits   Clinical Impression/Discharge Summary: Patient has made good gains towards therapy goals this CIR admission, meeting 4/8 long term goals set for admission. Patient is  currently overall mod assist for basic communication and severity of communication deficits served as barrier to meeting communication goals. Patient continues to tolerate a Dys1/NTL diet and receives supplemental hydration via G-tube. Patient making slow steady progress towards improvement in oral deficits. Patient is at an overall supervision level for sustained attention to task for 30 minutes in a controlled environment. Patient would benefit from continued SLP services at next venue of care (home health SLP) to continue to target lingering communication, cognitive, and swallowing deficits. Patient and family education complete. No further SLP needs at this venue of care.  Care Partner:  Caregiver Able to Provide Assistance: Yes  Type of Caregiver Assistance: Cognitive  Recommendation:  24 hour supervision/assistance;Home Health SLP  Rationale for SLP Follow Up: Maximize functional communication;Maximize swallowing safety;Maximize cognitive function and independence   Equipment: n/a   Reasons for discharge: Discharged from hospital   Patient/Family Agrees with Progress Made and Goals Achieved: Yes   [name], M.A., CCC-SLP   [name] 05/18/2023, 2:43 PM

## 2023-05-19 ENCOUNTER — Other Ambulatory Visit (HOSPITAL_COMMUNITY): Payer: Self-pay

## 2023-05-19 LAB — CBC WITH DIFFERENTIAL/PLATELET
Abs Immature Granulocytes: 0.02 10*3/uL (ref 0.00–0.07)
Basophils Absolute: 0.1 10*3/uL (ref 0.0–0.1)
Basophils Relative: 1 %
Eosinophils Absolute: 0.2 10*3/uL (ref 0.0–0.5)
Eosinophils Relative: 3 %
HCT: 41.2 % (ref 39.0–52.0)
Hemoglobin: 13.7 g/dL (ref 13.0–17.0)
Immature Granulocytes: 0 %
Lymphocytes Relative: 14 %
Lymphs Abs: 0.9 10*3/uL (ref 0.7–4.0)
MCH: 27.4 pg (ref 26.0–34.0)
MCHC: 33.3 g/dL (ref 30.0–36.0)
MCV: 82.4 fL (ref 80.0–100.0)
Monocytes Absolute: 0.7 10*3/uL (ref 0.1–1.0)
Monocytes Relative: 11 %
Neutro Abs: 4.7 10*3/uL (ref 1.7–7.7)
Neutrophils Relative %: 71 %
Platelets: 123 10*3/uL — ABNORMAL LOW (ref 150–400)
RBC: 5 MIL/uL (ref 4.22–5.81)
RDW: 13.8 % (ref 11.5–15.5)
WBC: 6.5 10*3/uL (ref 4.0–10.5)
nRBC: 0 % (ref 0.0–0.2)

## 2023-05-19 LAB — GLUCOSE, CAPILLARY
Glucose-Capillary: 104 mg/dL — ABNORMAL HIGH (ref 70–99)
Glucose-Capillary: 179 mg/dL — ABNORMAL HIGH (ref 70–99)

## 2023-05-19 MED ORDER — SENNOSIDES-DOCUSATE SODIUM 8.6-50 MG PO TABS
2.0000 | ORAL_TABLET | Freq: Two times a day (BID) | ORAL | 0 refills | Status: AC
Start: 2023-05-19 — End: ?
  Filled 2023-05-19: qty 60, 15d supply, fill #0

## 2023-05-19 MED ORDER — ACETAMINOPHEN 325 MG PO TABS: 650.0000 mg | ORAL_TABLET | ORAL | Status: AC | PRN

## 2023-05-19 NOTE — Progress Notes (Signed)
PROGRESS NOTE   Subjective/Complaints:   Pt reports ready for d/c.  Labs look good Wife asking about briefs- let her know insurance doesn't cover briefs- only Medicaid. Explained usually improves in 30-60 days  Needs IV out- wrote order.    ROS: limited by aphasia   Objective:   IR GASTROSTOMY TUBE MOD SED  Result Date: 05/17/2023 INDICATION: Dysphagia.  CVA. EXAM: PERCUTANEOUS GASTROSTOMY TUBE PLACEMENT COMPARISON:  CT abdomen, 05/13/2023. FL swallow evaluation, 05/13/2023. MEDICATIONS: Ancef 2 gm IV; Antibiotics were administered within 1 hour of the procedure. CONTRAST:  15 mL of Isovue 300 administered into the gastric lumen. ANESTHESIA/SEDATION: Moderate (conscious) sedation was employed during this procedure. A total of Versed 2 mg and Fentanyl 100 mcg was administered intravenously. Moderate Sedation Time: 13 minutes. The patient's level of consciousness and vital signs were monitored continuously by radiology nursing throughout the procedure under my direct supervision. FLUOROSCOPY TIME:  Fluoroscopic dose; 2 mGy COMPLICATIONS: None immediate. PROCEDURE: Informed written consent was obtained from the patient and/or patient's representative following explanation of the procedure, risks, benefits and alternatives. A time out was performed prior to the initiation of the procedure. Maximal barrier sterile technique utilized including caps, mask, sterile gowns, sterile gloves, large sterile drape, hand hygiene and Betadine prep. The left upper quadrant was sterilely prepped and draped. A oral gastric catheter was inserted into the stomach under fluoroscopy. The existing nasogastric feeding tube was removed. The left costal margin and barium opacified transverse colon were identified and avoided. Air was injected into the stomach for insufflation and visualization under fluoroscopy. Under sterile conditions and local anesthesia, 2T tacks  were utilized to pexy the anterior aspect of the stomach against the ventral abdominal wall. Contrast injection confirmed appropriate positioning of each of the T tacks. An incision was made between the T tacks and a 17 gauge trocar needle was utilized to access the stomach. Needle position was confirmed within the stomach with aspiration of air and injection of a small amount of contrast. A stiff Glidewire was advanced into the gastric lumen and under intermittent fluoroscopic guidance, the access needle was exchanged for a telescoping peel-away sheath, ultimately allowing placement of an 18 Fr balloon retention gastrostomy tube. The retention balloon was insufflated with a mixture of dilute saline and contrast and pulled taut against the anterior wall of the stomach. The external disc was cinched. Contrast injection confirms positioning within the stomach. Several spot radiographic images were obtained in various obliquities for documentation. The patient tolerated procedure well without immediate post procedural complication. FINDINGS: After successful fluoroscopic guided placement, the gastrostomy tube is appropriately positioned with internal retention balloon against the ventral aspect of the gastric lumen. IMPRESSION: Successful fluoroscopic-guided percutaneous insertion of an 18 Fr balloon-retention gastrostomy tube. The gastrostomy may be used immediately for medication administration and in 4 hrs for the initiation of feeds. RECOMMENDATIONS: The patient will return to V[facility]iology (VIR) for routine feeding tube evaluation and exchange in 6 months. Timothy Banning, MD [facility] Radiology Electronically Signed   By: Timothy [name] M.D.   On: 05/17/2023 16:50   Recent Labs    05/17/23 0605 05/19/23 0612  WBC  4.8 6.5  HGB 12.1* 13.7  HCT 36.7* 41.2  PLT 104* 123*      Recent Labs    05/17/23 0605  NA 137  K 4.5  CL 105  CO2 25   GLUCOSE 109*  BUN 30*  CREATININE 1.31*  CALCIUM 9.2     Intake/Output Summary (Last 24 hours) at 05/19/2023 0832 Last data filed at 05/19/2023 0759 Gross per 24 hour  Intake 955 ml  Output 1350 ml  Net -395 ml         Physical Exam: Vital Signs Blood pressure 129/77, pulse (!) 58, temperature 98.5 F (36.9 C), temperature source Oral, resp. rate 18, height 5\' 7"  (1.702 m), weight 72.1 kg, SpO2 98%.      General: awake, alert, appropriate, wife at bedside; NAD HENT: conjugate gaze; oropharynx moist-c ortrak out CV: bradycardic rate; no JVD Pulmonary: CTA B/L; no W/R/R- good air movement GI: soft, NT, ND, (+)BS normoactive- PEG looks OK-  Psychiatric: appropriate- still flat- focused on tv Neurological: moderate aphasia  MSK: Cannot fully extend R 5th digit- otherwise full ROM  Neuro: Oriented to person, not oriented to place with options, oriented to month but not year Moving all 4 extremities antigravity and against resistance in bed Prior exam:  RUE: 4-/5 Deltoid, 4-/5 Biceps, 4-/5 Triceps, 4-/5 Grip--4/5 LUE: 5/5 Deltoid, 5/5 Biceps, 5/5 Triceps,  5/5 Grip RLE: HF 4/5, KE 4+/5, ADF 4+/5, APF 4+/5 LLE: HF 5/5, KE 5/5, ADF 5/5, APF 5/5  Assessment/Plan: 1. Functional deficits which require 3+ hours per day of interdisciplinary therapy in a comprehensive inpatient rehab setting. Physiatrist is providing close team supervision and 24 hour management of active medical problems listed below. Physiatrist and rehab team continue to assess barriers to discharge/monitor patient progress toward functional and medical goals  Care Tool:  Bathing    Body parts bathed by patient: Right arm, Left arm, Chest, Abdomen, Front perineal area, Buttocks, Right upper leg, Left upper leg, Right lower leg, Left lower leg, Face   Body parts bathed by helper: Left lower leg, Right lower leg, Buttocks     Bathing assist Assist Level: Supervision/Verbal cueing     Upper Body  Dressing/Undressing Upper body dressing   What is the patient wearing?: Pull over shirt    Upper body assist Assist Level: Supervision/Verbal cueing    Lower Body Dressing/Undressing Lower body dressing      What is the patient wearing?: Underwear/pull up, Pants     Lower body assist Assist for lower body dressing: Supervision/Verbal cueing     Toileting Toileting    Toileting assist Assist for toileting: Supervision/Verbal cueing     Transfers Chair/bed transfer  Transfers assist     Chair/bed transfer assist level: Supervision/Verbal cueing     Locomotion Ambulation   Ambulation assist      Assist level: Supervision/Verbal cueing Assistive device: No Device Max distance: 500 ft   Walk 10 feet activity   Assist     Assist level: Supervision/Verbal cueing Assistive device: No Device   Walk 50 feet activity   Assist    Assist level: Supervision/Verbal cueing Assistive device: No Device    Walk 150 feet activity   Assist    Assist level: Supervision/Verbal cueing Assistive device: No Device    Walk 10 feet on uneven surface  activity   Assist     Assist level: Supervision/Verbal cueing     Wheelchair     Assist Is the patient using a wheelchair?: No Type  of Wheelchair: Manual    Wheelchair assist level: Dependent - Patient 0% Max wheelchair distance: 150'    Wheelchair 50 feet with 2 turns activity    Assist        Assist Level: Dependent - Patient 0%   Wheelchair 150 feet activity     Assist      Assist Level: Dependent - Patient 0%   Blood pressure 129/77, pulse (!) 58, temperature 98.5 F (36.9 C), temperature source Oral, resp. rate 18, height 5\' 7"  (1.702 m), weight 72.1 kg, SpO2 98%.  Medical Problem List and Plan: 1. 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  Etiology suspected to be A-fib not on anticoagulation versus large  vessel disease             -patient may shower             -ELOS/Goals: 10 to 14 days, supervision to min assist with PT, OT, SLP           D/C day Wednesday 10/16  Con't CIR PT, OT and SLP  Team conference today to finalize d/c plans   2.  Antithrombotics: -DVT/anticoagulation:  Pharmaceutical: Heparin initiated for anticoagulation. PLAN TO TRANSITION TO ELIQUIS 5 MG BID ONCE ABLE TO SWALLOW. 9/28 still NPO 10/1- per pharmacy change change to Eliquis- stopped ASA and Heparin and added Eliquis - but then realized wasn't clear- called pharmacy again- will reach out to Dr Pearlean Brownie and see if need ASA at all? 10/2- ASA stopped per Dr Pearlean Brownie 10/11: Eliquis Dced, heparin bridge pending PEG placement Monday-- Heparin supratherapeutic, HGB down from 14->12 and BUN increasing; h/h in AM, FOBT ordered, monitor for apparent s/s blood loss 10/12: HgB stable 11.9; heparin rate unchanged per pharmacy 10/14- Hb stable at 12.1- Plts down to 104k- if keeps dropping, will do more intervention 10/15- will need f/u with PCP- to check labs-              -antiplatelet therapy: Aspirin 325 mg daily 3. Pain Management: tylenol as needed  9/30- denies pain- con't regimen 4. Mood/Behavior/Sleep: Provide emotional support             -antipsychotic agents: N/A 5. Neuropsych/cognition: This patient is not capable of making decisions on his own behalf. 6. Skin/Wound Care: Routine skin checks 7. Fluids/Electrolytes/Nutrition: Routine and analysis with follow-up chemistries 8.  Dysphagia.  Currently NPO, Cortrak. Speech therapy follow-up.  9/27- pulled cortrak accidentally last night- out a few inches- per nursing- KUB looks OK- reviewed it- can use- mittens? Recheck modified barium swallow on Tuesday, October 1  10/1- pending MBSS  10/2- now on D3 nectar thick diet  10/5- Diet remains D1 with nectar thick. Continue TF also  10/6 pt ate 25-50-65% yesterday, will reduce TF to 35cc/hr  10/8- will look into PEG  10/11-  spoke to Ir_plan for PEG Monday  -MBSS done- still at D1 nectar thick liquids  10/14- PEG scheduled for today  10/15- got PEG_ d/w IR- can use PEG_ d/c'd Cortrak 9.  Hypertension.  Imdur 60 mg daily.  Monitor with increased mobility.  Long-term blood pressure goal normotensive  9/26- will change Imdur to immediate release- d/w pharmacy- changed  10/11- BP usually controlled- is 150s this AM- however is unusual- will monitor trend  10/14- BP overall controlled has a few very slightly elevated- con't to monitor  10/15- BP controlled- con't regimen 10/16- BP a little elevated this AM, but usually  controlled Vitals:   05/18/23 2004 05/19/23 0538  BP: (!) 155/87 129/77  Pulse: 71 (!) 58  Resp: 18   Temp: 98.8 F (37.1 C) 98.5 F (36.9 C)  SpO2: 100% 98%    10.  Bradycardia into the 40s.  Coreg held.  Avoid negative chronotropic medications.   Monitor with increased mobility  9/30-10/1- doing slightly better- in 50s- not 40's  10/2-10/5 HR in 50s-60's - con't regimen - stable 10/12, 10/13 11.  Hyperlipidemia.  Pravachol 12.  CKD stage IIIa.  Follow-up chemistries  9/30- Cr 1.48- up from 1.30- BUN up to 29- will increase Free water to 250 cc q4 - don't want to increase further due to [facility]-- and see how goes in labs tomorrow-   10/1- BUN up to 34 and Cr 1.54 up from 1.48- will consult nutritionist  for free water changes-   10/2- spoke with nutritonist- will change TF's to night time only- maintain fluids, but decrease protein- also adding supplements if doesn't eat enough- per pt/wife eating really well- will check BMP in AM  10/3- Pt's food downgraded to D1 diet- also BUN up to 42 up from 34 and Cr stable at 1.55- will give low dose IVFs to try and help turn this Around- will do 75cc for 12 hours 5pm to 5am- and recheck in AM with BMP  10/4- pending labs this AM- pt doesn't have labs drawn yet- pending  10/5 improved BUN/Cr yesterday--will increase free water thru tube today   10/6 pt ate  better yesterday   -cut nocturnal TF in half   -continue increased H20 flushes   -labs tomorrow  10/7- BUN/Cr stable- however on water flushes as well- explained to pt will need to take double the amount of Nectar thick fluids, if remove Cortrak. Will d/w SLP and dietitian  10/8- still getting 300cc free water and I think pt needs PEG_ esp since they downgraded diet to D1 nectar thick again this AM- will d/w pt  10/12: FWF 300 ml Q4H; continue. Will get BMP with labs for heparin this evening --> BUN > Cr uptrending with HgB downtrending; repeat BMP in AM, concerning for GIB  10/13: HGB stable, vitals stable, FOBT pending but with improved BUN/Cr will continue current Tx   10/14- BUN 1.31 and BUN 30- Hb back up to 12.1- is less dry- so not sure why- pending FOBT  10/15- will go home on water flushes and can use Ensure, told wife- if he doesn't eat his meal- exlained to pt cannot eat another diet than what SLP wants him on- increased risk of pneumonia/aspiration.   10/16- will need f/u with PCP- labs    Latest Ref Rng & Units 05/17/2023    6:05 AM 05/16/2023    7:20 AM 05/15/2023    4:45 PM  BMP  Glucose 70 - 99 mg/dL 161  096  98   BUN 8 - 23 mg/dL 30  33  42   Creatinine 0.61 - 1.24 mg/dL 0.45  4.09  8.11   Sodium 135 - 145 mmol/L 137  133  139   Potassium 3.5 - 5.1 mmol/L 4.5  4.0  4.6   Chloride 98 - 111 mmol/L 105  101  104   CO2 22 - 32 mmol/L 25  27  26    Calcium 8.9 - 10.3 mg/dL 9.2  8.9  9.1     13.  Diastolic congestive heart failure.  Monitor for any signs of fluid overload. 9/28- stable  9/30- weight stable- 10/1- no weight today- will d/w nursing   10/2- weight up slightly- will monitor for trend  10/3- 71.3 kg today- up 1 lb from yesterday-if goes above 73 kg, will give Lasix  10/5- Weight 71.2 kg this AM- stable  10/14-10/15 weight stable Filed Weights   05/17/23 0500 05/18/23 0405 05/19/23 0538  Weight: 72.5 kg 72.1 kg 72.1 kg    14.  History of tobacco/alcohol  use/Cocaine.  Provide counseling 15.  Medical noncompliance.  Counseling 16.  Atrial fibrillation.  Hold Coreg due to bradycardia.  Continue ASA and heparin --> Eliquis --> 10/11 heparin bridge for PEG 10/15- Switching from Heparin gtt to Eliquis at noon today- d/w pharmacy- they will make the change 10/16- on Eliquis to go home with  17. Constipation/bowel incontinence  9/26- will give sorbitol after therapy today  9/27- no results with sorbitol- will give another Sorbitol but 60cc and then soap suds enema if no results  10/1- cannot tell when having BM- has no control per wife- last went yesterday  10/4- LBM yesterday, but still has no control- we discussed doing suppository/bowel program- pt refused for now , but said he'd "think about it".   10/6- no bm since 10/3--add miralax and senokot-s at HS.   10/8-10/9 LBM yesterday- still having incontinence- pt doesn't want suppository-   10/10- LBM yesterday- still having incontinence- doesn't want bowel program  10/11- going daily per pt/wife- but cannot control it.   10/14- LBM 2 days ago- if doesn't go by tomorrow, will intervene  10/15- LBM las tnight- was continent.   10/16- still having accidents per wife of bowel- explained usually improves within 30-60 days after stroke.  18. Hyperkalemia  9/26- will recheck in AM- is 5.3- however was 4.1- so hesitant to give Lokelma without rechecking 9/27 - K+ 4.2- so likely hemolysis yesterday  10/6 k+ 4.5 10/4 Repeeat 10/12 - 4.6 -> 10/13 4.0 10/14- K_ 4.5 19. Diabetes- on Tfs  9/26- will add SSI, since A1c 6.9 and on Tf's- do q4 hours per protocol.   9/30-10/1 CBGs running well- might need to change if gets diet with MBSS today  10/2- change to TID with meals- and monitor  10/3-10/5 CBGs reasonable control--no changes  10/12-13 - mildly elevated, like d/t tube feeds, continue SSI  10/16- Cbgs well controlled CBG (last 3)  Recent Labs    05/18/23 1702 05/18/23 2057 05/19/23 0601  GLUCAP  91 106* 104*  20. Thrombocytopenia  10/16- Plts have been dropping some as well as Hb- will check this Am to follow up- Plts 123k- so have rebounded and Hb up to 13.7- so back to normal for him- con't to monitor outpt.     I spent a total of 32   minutes on total care today- >50% coordination of care- due to d/w nursing about PEG, removal of IV- also d/w wife- she's feeling more comfortable about PEG; wants briefs for pt- explained only medicaid covers, however can ask nursing for a few to go home with.     LOS: 21 days A FACE TO FACE EVALUATION WAS PERFORMED  Timothy [name] 05/19/2023, 8:32 AM

## 2023-05-19 NOTE — Progress Notes (Signed)
INPATIENT REHABILITATION DISCHARGE NOTE   Discharge instructions by:Pam PA  Verbalized understanding:yes  Skin care/Wound care healing?none  Pain:none  IV's: d/c'd  Tubes/Drains:peg intact and flushed by wife assisted by RN  O2: none Safety instructions:done  Patient belongings: done Discharged UX:LKGM  Discharged via:car  Notes: Waiting for ride

## 2023-05-21 ENCOUNTER — Telehealth: Payer: Self-pay | Admitting: Registered Nurse

## 2023-05-21 NOTE — Telephone Encounter (Signed)
Transitional Care call  Patient name: [name]  DOB: 1955/09/23 Are you/is patient experiencing any problems since coming home? No Are there any questions regarding any aspect of care? No Are there any questions regarding medications administration/dosing? No Are meds being taken as prescribed? Yes "Patient should review meds with caller to confirm" Medication List was reviewed.  Have there been any falls? No Has Home Health been to the house and/or have they contacted you? Yes. [facility] : Nurse came out today and unplug G-Tube wife reports.  If not, have you tried to contact them? See above  Can we help you contact them? NA Are bowels and bladder emptying properly? Yes Are there any unexpected incontinence issues? No If applicable, is patient following bowel/bladder programs? NA Any fevers, problems with breathing, unexpected pain? No Are there any skin problems or new areas of breakdown? No  Has the patient/family member arranged specialty MD follow up (ie cardiology/neurology/renal/surgical/etc.)?  [name] will call [name] PCP to schedule HFU appointment. She will call Dr Corliss Skains office to schedule HFU appointment. Numbers were given to [name]. This provider called [facility] and left message to call [name] to schedule HFU appointment. [name] was instructed to call [facility] on Monday, if she doesn't receive a return call, she verbalizes understanding.  Can we help arrange? See above  Does the patient need any other services or support that we can help arrange? No Are caregivers following through as expected in assisting the patient? Yes Has the patient quit smoking, drinking alcohol, or using drugs as recommended? [name] reports he is not drinking alcohol or using and drugs.   Appointment date/time 05/31/2023  arrival time 10:40 for 11:00 appointment with Jones Bales ANP-C. At [address] [facility]  [phone]

## 2023-05-21 NOTE — Telephone Encounter (Signed)
See note

## 2023-05-29 NOTE — Discharge Summary (Incomplete)
not enter airway: Moderately thick liquids (Level 3, honey thick); Puree 2.  Material enters airway, remains ABOVE vocal cords then ejected out: Mildly thick liquids (Level 2, nectar thick) 8.  Material enters airway, passes BELOW cords without attempt by patient to eject out (silent aspiration) : Thin liquids (Level 0) Compensatory Strategies: Compensatory Strategies Compensatory strategies: Yes Chin tuck: Ineffective Ineffective Chin Tuck: Thin liquid (Level 0) Oral bolus hold: Ineffective Ineffective Oral Bolus Hold : Thin liquid (Level 0)   General Information: Caregiver present: No  Diet Prior to this Study: Dysphagia 1 (pureed); Mildly thick liquids (Level 2, nectar thick)   Temperature : Normal   Respiratory Status: WFL   Supplemental O2: None (Room air)   History  of Recent Intubation: Yes  Behavior/Cognition: Alert; Cooperative; Requires cueing; Pleasant mood Self-Feeding Abilities: Able to self-feed Baseline vocal quality/speech: Normal Volitional Cough: Able to elicit Volitional Swallow: Able to elicit Exam Limitations: No limitations Goal Planning: Prognosis for improved oropharyngeal function: Guarded Barriers to Reach Goals: Language deficits; Cognitive deficits Barriers/Prognosis Comment: severity of deficits Patient/Family Stated Goal: none stated Consulted and agree with results and recommendations: Patient Pain: Pain Assessment Pain Assessment: No/denies pain Faces Pain Scale: 0 End of Session: Start Time:No data recorded Stop Time: No data recorded Time Calculation:No data recorded Charges: No data recorded SLP visit diagnosis: SLP Visit Diagnosis: Dysphagia, oropharyngeal phase (R13.12) Past Medical History: Past Medical History: Diagnosis Date . Alcohol abuse  . Atrial fibrillation (HCC)  . CHF (congestive heart failure) (HCC)  . CKD (chronic kidney disease) stage 3, GFR 30-59 ml/min (HCC)  . Hyperlipidemia  . Hypertension  . Noncompliance  Past Surgical History: Past Surgical History: Procedure Laterality Date . INGUINAL HERNIA REPAIR Right 07/17/2022  Procedure: HERNIA REPAIR INGUINAL INCARCERATED;  Surgeon: Lucretia Roers, MD;  Location: [facility];  Service: General;  Laterality: Right; . IR CT HEAD LTD  04/21/2023 . IR PERCUTANEOUS ART THROMBECTOMY/INFUSION INTRACRANIAL INC DIAG ANGIO  04/21/2023 . RADIOLOGY WITH ANESTHESIA N/A 04/21/2023  Procedure: IR WITH ANESTHESIA;  Surgeon: Radiologist, Medication, MD;  Location: [facility];  Service: Radiology;  Laterality: N/A; . XI ROBOTIC ASSISTED INGUINAL HERNIA REPAIR WITH MESH Left 03/19/2023  Procedure: XI ROBOTIC ASSISTED INGUINAL HERNIA REPAIR WITH MESH;  Surgeon: Franky Macho, MD;  Location: [facility];  Service: General;  Laterality: Left; [name], M.A., CCC-SLP [name] 05/13/2023, 10:10 AM  DG Swallowing  Func-Speech Pathology  Result Date: 05/04/2023 Table formatting from the original result was not included. Modified Barium Swallow Study Patient Details Name: Timothy [name] MRN: [id number] Date of Birth: May 02, 1956 Today's Date: 05/04/2023 HPI/PMH: HPI: Patient is 67 y.o. 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 Pt s/p revascularization of the of the superior branch of the right MCA with mechanical thrombectomy achieving TICI 2C revascularization. PMH: ETOH abuse, CHF, CKD, HTN. MBS completed 04/26/23 revealed: "oropharyngeal dysphagia includ(ing) timing, motor, and sensory components. He has reduced lingual control with disorganized posterior transit although minimal oral residue. There is anterior loss out of the R side of his mouth though. He has reduced hyolaryngeal movement and laryngeal vestibule closure, and when swallow is mistimed, he penetrates during the swallow across consistencies. Penetration also occurred occasionally from intermittent vallecular residue. Pt's volitional cough is weak and ineffective at clearing penetrates, which continue to fall over time until aspiration occurs. Aspiration for the most part is silent, although spontaneous coughing is also not effective." Clinical Impression: Clinical  Physician Discharge Summary  Patient ID: Timothy [name] MRN: [id number] DOB/AGE: [birth year] 67 y.o.  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  CVA. EXAM: PERCUTANEOUS GASTROSTOMY TUBE PLACEMENT COMPARISON:  CT abdomen, 05/13/2023. FL swallow evaluation, 05/13/2023. MEDICATIONS: Ancef 2 gm IV; Antibiotics were administered within 1 hour of the procedure. CONTRAST:  15 mL of Isovue 300 administered into the gastric lumen. ANESTHESIA/SEDATION: Moderate (conscious) sedation was employed during this procedure. A total of Versed 2 mg and Fentanyl 100 mcg was administered intravenously. Moderate Sedation Time: 13 minutes. The patient's level of consciousness and vital signs were monitored continuously by radiology nursing throughout the procedure under my direct supervision. FLUOROSCOPY TIME:  Fluoroscopic dose; 2 mGy COMPLICATIONS: None immediate. PROCEDURE: Informed written consent was obtained from the patient and/or patient's representative following explanation of the procedure, risks, benefits and alternatives. A time out was performed prior to the initiation of the procedure. Maximal barrier sterile technique utilized including caps, mask, sterile gowns, sterile gloves, large sterile drape, hand hygiene and Betadine prep. The left upper quadrant was sterilely prepped and draped. A oral gastric catheter was inserted into the stomach under fluoroscopy. The existing  nasogastric feeding tube was removed. The left costal margin and barium opacified transverse colon were identified and avoided. Air was injected into the stomach for insufflation and visualization under fluoroscopy. Under sterile conditions and local anesthesia, 2T tacks were utilized to pexy the anterior aspect of the stomach against the ventral abdominal wall. Contrast injection confirmed appropriate positioning of each of the T tacks. An incision was made between the T tacks and a 17 gauge trocar needle was utilized to access the stomach. Needle position was confirmed within the stomach with aspiration of air and injection of a small amount of contrast. A stiff Glidewire was advanced into the gastric lumen and under intermittent fluoroscopic guidance, the access needle was exchanged for a telescoping peel-away sheath, ultimately allowing placement of an 18 Fr balloon retention gastrostomy tube. The retention balloon was insufflated with a mixture of dilute saline and contrast and pulled taut against the anterior wall of the stomach. The external disc was cinched. Contrast injection confirms positioning within the stomach. Several spot radiographic images were obtained in various obliquities for documentation. The patient tolerated procedure well without immediate post procedural complication. FINDINGS: After successful fluoroscopic guided placement, the gastrostomy tube is appropriately positioned with internal retention balloon against the ventral aspect of the gastric lumen. IMPRESSION: Successful fluoroscopic-guided percutaneous insertion of an 18 Fr balloon-retention gastrostomy tube. The gastrostomy may be used immediately for medication administration and in 4 hrs for the initiation of feeds. RECOMMENDATIONS: The patient will return to [facility] (VIR) for routine feeding tube evaluation and exchange in 6 months. Roanna Banning, MD [facility]  [facility] Electronically Signed   By: Roanna Banning M.D.   On: 05/17/2023 16:50   CT ABDOMEN WO CONTRAST  Result Date: 05/13/2023 CLINICAL DATA:  Cerebral infarction, dysphagia, current nasal feeding tube and assessment for possible percutaneous gastrostomy  Physician Discharge Summary  Patient ID: Timothy [name] MRN: [id number] DOB/AGE: [birth year] 67 y.o.  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  CVA. EXAM: PERCUTANEOUS GASTROSTOMY TUBE PLACEMENT COMPARISON:  CT abdomen, 05/13/2023. FL swallow evaluation, 05/13/2023. MEDICATIONS: Ancef 2 gm IV; Antibiotics were administered within 1 hour of the procedure. CONTRAST:  15 mL of Isovue 300 administered into the gastric lumen. ANESTHESIA/SEDATION: Moderate (conscious) sedation was employed during this procedure. A total of Versed 2 mg and Fentanyl 100 mcg was administered intravenously. Moderate Sedation Time: 13 minutes. The patient's level of consciousness and vital signs were monitored continuously by radiology nursing throughout the procedure under my direct supervision. FLUOROSCOPY TIME:  Fluoroscopic dose; 2 mGy COMPLICATIONS: None immediate. PROCEDURE: Informed written consent was obtained from the patient and/or patient's representative following explanation of the procedure, risks, benefits and alternatives. A time out was performed prior to the initiation of the procedure. Maximal barrier sterile technique utilized including caps, mask, sterile gowns, sterile gloves, large sterile drape, hand hygiene and Betadine prep. The left upper quadrant was sterilely prepped and draped. A oral gastric catheter was inserted into the stomach under fluoroscopy. The existing  nasogastric feeding tube was removed. The left costal margin and barium opacified transverse colon were identified and avoided. Air was injected into the stomach for insufflation and visualization under fluoroscopy. Under sterile conditions and local anesthesia, 2T tacks were utilized to pexy the anterior aspect of the stomach against the ventral abdominal wall. Contrast injection confirmed appropriate positioning of each of the T tacks. An incision was made between the T tacks and a 17 gauge trocar needle was utilized to access the stomach. Needle position was confirmed within the stomach with aspiration of air and injection of a small amount of contrast. A stiff Glidewire was advanced into the gastric lumen and under intermittent fluoroscopic guidance, the access needle was exchanged for a telescoping peel-away sheath, ultimately allowing placement of an 18 Fr balloon retention gastrostomy tube. The retention balloon was insufflated with a mixture of dilute saline and contrast and pulled taut against the anterior wall of the stomach. The external disc was cinched. Contrast injection confirms positioning within the stomach. Several spot radiographic images were obtained in various obliquities for documentation. The patient tolerated procedure well without immediate post procedural complication. FINDINGS: After successful fluoroscopic guided placement, the gastrostomy tube is appropriately positioned with internal retention balloon against the ventral aspect of the gastric lumen. IMPRESSION: Successful fluoroscopic-guided percutaneous insertion of an 18 Fr balloon-retention gastrostomy tube. The gastrostomy may be used immediately for medication administration and in 4 hrs for the initiation of feeds. RECOMMENDATIONS: The patient will return to V[facility] Inter[facility]iology (VIR) for routine feeding tube evaluation and exchange in 6 months. Roanna Banning, MD [facility]  [facility] Electronically Signed   By: Roanna Banning M.D.   On: 05/17/2023 16:50   CT ABDOMEN WO CONTRAST  Result Date: 05/13/2023 CLINICAL DATA:  Cerebral infarction, dysphagia, current nasal feeding tube and assessment for possible percutaneous gastrostomy  not enter airway: Moderately thick liquids (Level 3, honey thick); Puree 2.  Material enters airway, remains ABOVE vocal cords then ejected out: Mildly thick liquids (Level 2, nectar thick) 8.  Material enters airway, passes BELOW cords without attempt by patient to eject out (silent aspiration) : Thin liquids (Level 0) Compensatory Strategies: Compensatory Strategies Compensatory strategies: Yes Chin tuck: Ineffective Ineffective Chin Tuck: Thin liquid (Level 0) Oral bolus hold: Ineffective Ineffective Oral Bolus Hold : Thin liquid (Level 0)   General Information: Caregiver present: No  Diet Prior to this Study: Dysphagia 1 (pureed); Mildly thick liquids (Level 2, nectar thick)   Temperature : Normal   Respiratory Status: WFL   Supplemental O2: None (Room air)   History  of Recent Intubation: Yes  Behavior/Cognition: Alert; Cooperative; Requires cueing; Pleasant mood Self-Feeding Abilities: Able to self-feed Baseline vocal quality/speech: Normal Volitional Cough: Able to elicit Volitional Swallow: Able to elicit Exam Limitations: No limitations Goal Planning: Prognosis for improved oropharyngeal function: Guarded Barriers to Reach Goals: Language deficits; Cognitive deficits Barriers/Prognosis Comment: severity of deficits Patient/Family Stated Goal: none stated Consulted and agree with results and recommendations: Patient Pain: Pain Assessment Pain Assessment: No/denies pain Faces Pain Scale: 0 End of Session: Start Time:No data recorded Stop Time: No data recorded Time Calculation:No data recorded Charges: No data recorded SLP visit diagnosis: SLP Visit Diagnosis: Dysphagia, oropharyngeal phase (R13.12) Past Medical History: Past Medical History: Diagnosis Date . Alcohol abuse  . Atrial fibrillation (HCC)  . CHF (congestive heart failure) (HCC)  . CKD (chronic kidney disease) stage 3, GFR 30-59 ml/min (HCC)  . Hyperlipidemia  . Hypertension  . Noncompliance  Past Surgical History: Past Surgical History: Procedure Laterality Date . INGUINAL HERNIA REPAIR Right 07/17/2022  Procedure: HERNIA REPAIR INGUINAL INCARCERATED;  Surgeon: Lucretia Roers, MD;  Location: [facility];  Service: General;  Laterality: Right; . IR CT HEAD LTD  04/21/2023 . IR PERCUTANEOUS ART THROMBECTOMY/INFUSION INTRACRANIAL INC DIAG ANGIO  04/21/2023 . RADIOLOGY WITH ANESTHESIA N/A 04/21/2023  Procedure: IR WITH ANESTHESIA;  Surgeon: Radiologist, Medication, MD;  Location: [facility];  Service: Radiology;  Laterality: N/A; . XI ROBOTIC ASSISTED INGUINAL HERNIA REPAIR WITH MESH Left 03/19/2023  Procedure: XI ROBOTIC ASSISTED INGUINAL HERNIA REPAIR WITH MESH;  Surgeon: Franky Macho, MD;  Location: [facility];  Service: General;  Laterality: Left; [name], M.A., CCC-SLP [name] 05/13/2023, 10:10 AM  DG Swallowing  Func-Speech Pathology  Result Date: 05/04/2023 Table formatting from the original result was not included. Modified Barium Swallow Study Patient Details Name: Timothy [name] MRN: [id number] Date of Birth: May 02, 1956 Today's Date: 05/04/2023 HPI/PMH: HPI: Patient is 67 y.o. 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 Pt s/p revascularization of the of the superior branch of the right MCA with mechanical thrombectomy achieving TICI 2C revascularization. PMH: ETOH abuse, CHF, CKD, HTN. MBS completed 04/26/23 revealed: "oropharyngeal dysphagia includ(ing) timing, motor, and sensory components. He has reduced lingual control with disorganized posterior transit although minimal oral residue. There is anterior loss out of the R side of his mouth though. He has reduced hyolaryngeal movement and laryngeal vestibule closure, and when swallow is mistimed, he penetrates during the swallow across consistencies. Penetration also occurred occasionally from intermittent vallecular residue. Pt's volitional cough is weak and ineffective at clearing penetrates, which continue to fall over time until aspiration occurs. Aspiration for the most part is silent, although spontaneous coughing is also not effective." Clinical Impression: Clinical  tube placement for chronic nutritional needs. EXAM: CT ABDOMEN WITHOUT CONTRAST TECHNIQUE: Multidetector CT imaging of the abdomen was performed following the standard protocol without IV contrast. RADIATION DOSE REDUCTION: This exam was performed according to the departmental dose-optimization program which includes automated exposure control, adjustment of the mA and/or kV according to patient size and/or use of iterative reconstruction technique. COMPARISON:  CT of the abdomen and pelvis on 07/16/2022 FINDINGS: Lower chest: No acute abnormality. Visible heavily calcified coronary arteries. Hepatobiliary: No focal liver abnormality is seen. No gallstones, gallbladder wall thickening, or biliary dilatation. Pancreas: Unremarkable. No pancreatic ductal dilatation or surrounding inflammatory changes. Spleen: No splenic injury or perisplenic hematoma. Adrenals/Urinary Tract: No adrenal masses. Stable bilateral simple Bosniak 1 renal cysts requiring no follow-up. No hydronephrosis or renal calculi. Stomach/Bowel: Feeding tube enters the stomach and terminates in the region of the antrum of the stomach. No hiatal hernia. The stomach is normally positioned and there is no interposition of colon or liver between the body of the stomach and the abdominal wall. No evidence of bowel obstruction or significant ileus. There is some barium in the colon, presumably related to recent modified barium swallow studies. No free intraperitoneal air. Vascular/Lymphatic: Atherosclerosis of the abdominal aorta without aneurysm. No lymphadenopathy identified. Other: No hernias, ascites or focal fluid collections.  No anasarca. Musculoskeletal: No acute or significant  osseous findings. IMPRESSION: 1. Feeding tube enters the stomach and terminates in the region of the antrum of the stomach. The stomach is normally positioned and there is no interposition of colon or liver between the body of the stomach and the abdominal wall. There is no anatomic contraindication to attempted percutaneous gastrostomy tube placement. 2. Coronary atherosclerosis. 3. Aortic atherosclerosis. Aortic Atherosclerosis (ICD10-I70.0). Electronically Signed   By: Irish Lack M.D.   On: 05/13/2023 13:43   DG Swallowing Func-Speech Pathology  Result Date: 05/13/2023 Table formatting from the original result was not included. Modified Barium Swallow Study Patient Details Name: Timothy [name] MRN: [id number] Date of Birth: 03-26-1956 Today's Date: 05/13/2023 HPI/PMH: HPI: Patient is 67 y.o. 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 Pt s/p revascularization of the of the superior branch of the right MCA with mechanical thrombectomy achieving TICI 2C revascularization. PMH: ETOH abuse, CHF, CKD, HTN. MBS completed 04/26/23 revealed: "oropharyngeal dysphagia includ(ing) timing, motor, and sensory components. He has reduced lingual control with disorganized posterior transit although minimal oral residue. There is anterior loss out of the R side of his mouth though. He has reduced hyolaryngeal movement and laryngeal vestibule closure, and when swallow is mistimed, he penetrates during the swallow across consistencies. Penetration also occurred occasionally from intermittent vallecular residue. Pt's volitional cough is weak and ineffective at clearing penetrates, which continue to fall over time until aspiration occurs. Aspiration for the most part is silent, although spontaneous coughing is also not effective." Repeated MBS 05/04/23 with subsequent upgrade to Dys1/NTL diet. Clinical Impression: Clinical Impression: Pt presents with oropharyngeal  dysphagia primarily characterized by reduced oral bolus hold resulting in posterior spillage into the pharynx and delay in swallow initiation lasting between 1-3 seconds at the level of the pyriform sinuses. Swallow delay/mistiming and pooling of bolus into the pyriforms prior to swallow initiation resulted in trace silent aspiration of 4/5 administered thin liquids boluses from teaspoon despite trials of bolus hold and chin tuck strategies. Residue in the pharynx improved from previous swallowing assessment. Penetration above the level of the vocal  tube placement for chronic nutritional needs. EXAM: CT ABDOMEN WITHOUT CONTRAST TECHNIQUE: Multidetector CT imaging of the abdomen was performed following the standard protocol without IV contrast. RADIATION DOSE REDUCTION: This exam was performed according to the departmental dose-optimization program which includes automated exposure control, adjustment of the mA and/or kV according to patient size and/or use of iterative reconstruction technique. COMPARISON:  CT of the abdomen and pelvis on 07/16/2022 FINDINGS: Lower chest: No acute abnormality. Visible heavily calcified coronary arteries. Hepatobiliary: No focal liver abnormality is seen. No gallstones, gallbladder wall thickening, or biliary dilatation. Pancreas: Unremarkable. No pancreatic ductal dilatation or surrounding inflammatory changes. Spleen: No splenic injury or perisplenic hematoma. Adrenals/Urinary Tract: No adrenal masses. Stable bilateral simple Bosniak 1 renal cysts requiring no follow-up. No hydronephrosis or renal calculi. Stomach/Bowel: Feeding tube enters the stomach and terminates in the region of the antrum of the stomach. No hiatal hernia. The stomach is normally positioned and there is no interposition of colon or liver between the body of the stomach and the abdominal wall. No evidence of bowel obstruction or significant ileus. There is some barium in the colon, presumably related to recent modified barium swallow studies. No free intraperitoneal air. Vascular/Lymphatic: Atherosclerosis of the abdominal aorta without aneurysm. No lymphadenopathy identified. Other: No hernias, ascites or focal fluid collections.  No anasarca. Musculoskeletal: No acute or significant  osseous findings. IMPRESSION: 1. Feeding tube enters the stomach and terminates in the region of the antrum of the stomach. The stomach is normally positioned and there is no interposition of colon or liver between the body of the stomach and the abdominal wall. There is no anatomic contraindication to attempted percutaneous gastrostomy tube placement. 2. Coronary atherosclerosis. 3. Aortic atherosclerosis. Aortic Atherosclerosis (ICD10-I70.0). Electronically Signed   By: Irish Lack M.D.   On: 05/13/2023 13:43   DG Swallowing Func-Speech Pathology  Result Date: 05/13/2023 Table formatting from the original result was not included. Modified Barium Swallow Study Patient Details Name: Timothy [name] MRN: [id number] Date of Birth: 03-26-1956 Today's Date: 05/13/2023 HPI/PMH: HPI: Patient is 67 y.o. 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 Pt s/p revascularization of the of the superior branch of the right MCA with mechanical thrombectomy achieving TICI 2C revascularization. PMH: ETOH abuse, CHF, CKD, HTN. MBS completed 04/26/23 revealed: "oropharyngeal dysphagia includ(ing) timing, motor, and sensory components. He has reduced lingual control with disorganized posterior transit although minimal oral residue. There is anterior loss out of the R side of his mouth though. He has reduced hyolaryngeal movement and laryngeal vestibule closure, and when swallow is mistimed, he penetrates during the swallow across consistencies. Penetration also occurred occasionally from intermittent vallecular residue. Pt's volitional cough is weak and ineffective at clearing penetrates, which continue to fall over time until aspiration occurs. Aspiration for the most part is silent, although spontaneous coughing is also not effective." Repeated MBS 05/04/23 with subsequent upgrade to Dys1/NTL diet. Clinical Impression: Clinical Impression: Pt presents with oropharyngeal  dysphagia primarily characterized by reduced oral bolus hold resulting in posterior spillage into the pharynx and delay in swallow initiation lasting between 1-3 seconds at the level of the pyriform sinuses. Swallow delay/mistiming and pooling of bolus into the pyriforms prior to swallow initiation resulted in trace silent aspiration of 4/5 administered thin liquids boluses from teaspoon despite trials of bolus hold and chin tuck strategies. Residue in the pharynx improved from previous swallowing assessment. Penetration above the level of the vocal  Physician Discharge Summary  Patient ID: Timothy [name] MRN: [id number] DOB/AGE: [birth year] 67 y.o.  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  CVA. EXAM: PERCUTANEOUS GASTROSTOMY TUBE PLACEMENT COMPARISON:  CT abdomen, 05/13/2023. FL swallow evaluation, 05/13/2023. MEDICATIONS: Ancef 2 gm IV; Antibiotics were administered within 1 hour of the procedure. CONTRAST:  15 mL of Isovue 300 administered into the gastric lumen. ANESTHESIA/SEDATION: Moderate (conscious) sedation was employed during this procedure. A total of Versed 2 mg and Fentanyl 100 mcg was administered intravenously. Moderate Sedation Time: 13 minutes. The patient's level of consciousness and vital signs were monitored continuously by radiology nursing throughout the procedure under my direct supervision. FLUOROSCOPY TIME:  Fluoroscopic dose; 2 mGy COMPLICATIONS: None immediate. PROCEDURE: Informed written consent was obtained from the patient and/or patient's representative following explanation of the procedure, risks, benefits and alternatives. A time out was performed prior to the initiation of the procedure. Maximal barrier sterile technique utilized including caps, mask, sterile gowns, sterile gloves, large sterile drape, hand hygiene and Betadine prep. The left upper quadrant was sterilely prepped and draped. A oral gastric catheter was inserted into the stomach under fluoroscopy. The existing  nasogastric feeding tube was removed. The left costal margin and barium opacified transverse colon were identified and avoided. Air was injected into the stomach for insufflation and visualization under fluoroscopy. Under sterile conditions and local anesthesia, 2T tacks were utilized to pexy the anterior aspect of the stomach against the ventral abdominal wall. Contrast injection confirmed appropriate positioning of each of the T tacks. An incision was made between the T tacks and a 17 gauge trocar needle was utilized to access the stomach. Needle position was confirmed within the stomach with aspiration of air and injection of a small amount of contrast. A stiff Glidewire was advanced into the gastric lumen and under intermittent fluoroscopic guidance, the access needle was exchanged for a telescoping peel-away sheath, ultimately allowing placement of an 18 Fr balloon retention gastrostomy tube. The retention balloon was insufflated with a mixture of dilute saline and contrast and pulled taut against the anterior wall of the stomach. The external disc was cinched. Contrast injection confirms positioning within the stomach. Several spot radiographic images were obtained in various obliquities for documentation. The patient tolerated procedure well without immediate post procedural complication. FINDINGS: After successful fluoroscopic guided placement, the gastrostomy tube is appropriately positioned with internal retention balloon against the ventral aspect of the gastric lumen. IMPRESSION: Successful fluoroscopic-guided percutaneous insertion of an 18 Fr balloon-retention gastrostomy tube. The gastrostomy may be used immediately for medication administration and in 4 hrs for the initiation of feeds. RECOMMENDATIONS: The patient will return to Vascular Interventional Radiology (VIR) for routine feeding tube evaluation and exchange in 6 months. Roanna Banning, MD [facility]  [facility] Electronically Signed   By: Roanna Banning M.D.   On: 05/17/2023 16:50   CT ABDOMEN WO CONTRAST  Result Date: 05/13/2023 CLINICAL DATA:  Cerebral infarction, dysphagia, current nasal feeding tube and assessment for possible percutaneous gastrostomy  tube placement for chronic nutritional needs. EXAM: CT ABDOMEN WITHOUT CONTRAST TECHNIQUE: Multidetector CT imaging of the abdomen was performed following the standard protocol without IV contrast. RADIATION DOSE REDUCTION: This exam was performed according to the departmental dose-optimization program which includes automated exposure control, adjustment of the mA and/or kV according to patient size and/or use of iterative reconstruction technique. COMPARISON:  CT of the abdomen and pelvis on 07/16/2022 FINDINGS: Lower chest: No acute abnormality. Visible heavily calcified coronary arteries. Hepatobiliary: No focal liver abnormality is seen. No gallstones, gallbladder wall thickening, or biliary dilatation. Pancreas: Unremarkable. No pancreatic ductal dilatation or surrounding inflammatory changes. Spleen: No splenic injury or perisplenic hematoma. Adrenals/Urinary Tract: No adrenal masses. Stable bilateral simple Bosniak 1 renal cysts requiring no follow-up. No hydronephrosis or renal calculi. Stomach/Bowel: Feeding tube enters the stomach and terminates in the region of the antrum of the stomach. No hiatal hernia. The stomach is normally positioned and there is no interposition of colon or liver between the body of the stomach and the abdominal wall. No evidence of bowel obstruction or significant ileus. There is some barium in the colon, presumably related to recent modified barium swallow studies. No free intraperitoneal air. Vascular/Lymphatic: Atherosclerosis of the abdominal aorta without aneurysm. No lymphadenopathy identified. Other: No hernias, ascites or focal fluid collections.  No anasarca. Musculoskeletal: No acute or significant  osseous findings. IMPRESSION: 1. Feeding tube enters the stomach and terminates in the region of the antrum of the stomach. The stomach is normally positioned and there is no interposition of colon or liver between the body of the stomach and the abdominal wall. There is no anatomic contraindication to attempted percutaneous gastrostomy tube placement. 2. Coronary atherosclerosis. 3. Aortic atherosclerosis. Aortic Atherosclerosis (ICD10-I70.0). Electronically Signed   By: Irish Lack M.D.   On: 05/13/2023 13:43   DG Swallowing Func-Speech Pathology  Result Date: 05/13/2023 Table formatting from the original result was not included. Modified Barium Swallow Study Patient Details Name: Timothy [name] MRN: [id number] Date of Birth: 03-26-1956 Today's Date: 05/13/2023 HPI/PMH: HPI: Patient is 67 y.o. 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 Pt s/p revascularization of the of the superior branch of the right MCA with mechanical thrombectomy achieving TICI 2C revascularization. PMH: ETOH abuse, CHF, CKD, HTN. MBS completed 04/26/23 revealed: "oropharyngeal dysphagia includ(ing) timing, motor, and sensory components. He has reduced lingual control with disorganized posterior transit although minimal oral residue. There is anterior loss out of the R side of his mouth though. He has reduced hyolaryngeal movement and laryngeal vestibule closure, and when swallow is mistimed, he penetrates during the swallow across consistencies. Penetration also occurred occasionally from intermittent vallecular residue. Pt's volitional cough is weak and ineffective at clearing penetrates, which continue to fall over time until aspiration occurs. Aspiration for the most part is silent, although spontaneous coughing is also not effective." Repeated MBS 05/04/23 with subsequent upgrade to Dys1/NTL diet. Clinical Impression: Clinical Impression: Pt presents with oropharyngeal  dysphagia primarily characterized by reduced oral bolus hold resulting in posterior spillage into the pharynx and delay in swallow initiation lasting between 1-3 seconds at the level of the pyriform sinuses. Swallow delay/mistiming and pooling of bolus into the pyriforms prior to swallow initiation resulted in trace silent aspiration of 4/5 administered thin liquids boluses from teaspoon despite trials of bolus hold and chin tuck strategies. Residue in the pharynx improved from previous swallowing assessment. Penetration above the level of the vocal

## 2023-05-31 ENCOUNTER — Encounter: Payer: Self-pay | Admitting: Registered Nurse

## 2023-05-31 ENCOUNTER — Encounter: Payer: Medicare PPO | Attending: Registered Nurse | Admitting: Registered Nurse

## 2023-05-31 VITALS — BP 137/82 | HR 60 | Ht 67.0 in | Wt 162.6 lb

## 2023-05-31 DIAGNOSIS — Z8679 Personal history of other diseases of the circulatory system: Secondary | ICD-10-CM

## 2023-05-31 DIAGNOSIS — F1910 Other psychoactive substance abuse, uncomplicated: Secondary | ICD-10-CM

## 2023-05-31 DIAGNOSIS — I10 Essential (primary) hypertension: Secondary | ICD-10-CM | POA: Diagnosis present

## 2023-05-31 DIAGNOSIS — I63512 Cerebral infarction due to unspecified occlusion or stenosis of left middle cerebral artery: Secondary | ICD-10-CM

## 2023-05-31 NOTE — Patient Instructions (Addendum)
Call [facility]: Regarding his Physical Therapy, Occupational and Speech Therapy   Phone:[phone]   Speak with his Primary Care Physician: Regarding his medication Refill.

## 2023-08-05 ENCOUNTER — Other Ambulatory Visit: Payer: Self-pay

## 2023-08-05 NOTE — Patient Outreach (Signed)
 First telephone outreach attempt to obtain mRS. No answer. Left message for returned call.  Vanice Sarah Care Management Assistant [phone]

## 2023-08-09 ENCOUNTER — Encounter: Payer: Medicare PPO | Attending: Registered Nurse | Admitting: Physical Medicine and Rehabilitation

## 2023-08-09 DIAGNOSIS — I10 Essential (primary) hypertension: Secondary | ICD-10-CM | POA: Insufficient documentation

## 2023-08-09 DIAGNOSIS — I63512 Cerebral infarction due to unspecified occlusion or stenosis of left middle cerebral artery: Secondary | ICD-10-CM | POA: Insufficient documentation

## 2023-08-09 DIAGNOSIS — F1910 Other psychoactive substance abuse, uncomplicated: Secondary | ICD-10-CM | POA: Insufficient documentation

## 2023-08-09 DIAGNOSIS — Z8679 Personal history of other diseases of the circulatory system: Secondary | ICD-10-CM | POA: Insufficient documentation

## 2023-08-10 ENCOUNTER — Other Ambulatory Visit: Payer: Self-pay

## 2023-08-10 NOTE — Patient Outreach (Signed)
 Second telephone outreach attempt to obtain mRS. No answer. Unable to leave message for returned call.  Vanice Sarah Care Management Assistant [phone]

## 2023-08-11 ENCOUNTER — Other Ambulatory Visit: Payer: Self-pay

## 2023-08-11 NOTE — Patient Outreach (Signed)
3 outreach attempts were completed to obtain mRs. mRs could not be obtained because patient never returned my calls. mRs=7    [place] Management Assistant [phone]

## 2023-08-19 ENCOUNTER — Encounter: Payer: Self-pay | Admitting: General Surgery

## 2023-08-19 ENCOUNTER — Ambulatory Visit: Payer: Medicare PPO | Admitting: General Surgery

## 2023-08-19 VITALS — BP 205/94 | HR 66 | Temp 97.8°F | Resp 16 | Ht 67.0 in | Wt 166.0 lb

## 2023-08-19 DIAGNOSIS — Z431 Encounter for attention to gastrostomy: Secondary | ICD-10-CM

## 2023-08-19 NOTE — Progress Notes (Signed)
Timothy [name]; [id number]; 1955/10/25   HPI Patient is a 68 year old black male who was referred to my care by Timothy [name] for PEG removal.  He had it placed last year by interventional radiology.  He is not using it at the present time.  His oral intake is good. Past Medical History:  Diagnosis Date   Alcohol abuse    Atrial fibrillation (HCC)    CHF (congestive heart failure) (HCC)    CKD (chronic kidney disease) stage 3, GFR 30-59 ml/min (HCC)    Hyperlipidemia    Hypertension    Noncompliance     Past Surgical History:  Procedure Laterality Date   INGUINAL HERNIA REPAIR Right 07/17/2022   Procedure: HERNIA REPAIR INGUINAL INCARCERATED;  Surgeon: Lucretia Roers, MD;  Location: [facility];  Service: General;  Laterality: Right;   IR CT HEAD LTD  04/21/2023   IR GASTROSTOMY TUBE MOD SED  05/17/2023   IR PERCUTANEOUS ART THROMBECTOMY/INFUSION INTRACRANIAL INC DIAG ANGIO  04/21/2023   RADIOLOGY WITH ANESTHESIA N/A 04/21/2023   Procedure: IR WITH ANESTHESIA;  Surgeon: Radiologist, Medication, MD;  Location: [facility];  Service: Radiology;  Laterality: N/A;   XI ROBOTIC ASSISTED INGUINAL HERNIA REPAIR WITH MESH Left 03/19/2023   Procedure: XI ROBOTIC ASSISTED INGUINAL HERNIA REPAIR WITH MESH;  Surgeon: Franky Macho, MD;  Location: [facility];  Service: General;  Laterality: Left;    Family History  Problem Relation Age of Onset   Diabetes Mother    Hypertension Mother    Cancer Father        Prostate Cancer   Hypertension Father    Diabetes Sister    Diabetes Brother    Hypertension Brother    Hypertension Brother     Current Outpatient Medications on File Prior to Visit  Medication Sig Dispense Refill   acetaminophen (TYLENOL) 325 MG tablet Take 2 tablets (650 mg total) by mouth every 4 (four) hours as needed for mild pain (pain score 1-3).     amLODipine (NORVASC) 10 MG tablet Take 10 mg by mouth daily.     apixaban (ELIQUIS) 5 MG TABS tablet Take 1 tablet (5 mg total) by mouth  2 (two) times daily. 60 tablet 0   folic acid (FOLVITE) 1 MG tablet Take 1 tablet (1 mg total) by mouth daily. 30 tablet 0   hydrocerin (EUCERIN) CREA Apply 1 Application topically 2 (two) times daily.     isosorbide dinitrate (ISORDIL) 20 MG tablet Take 1 tablet (20 mg total) by mouth 3 (three) times daily. 90 tablet 0   Multiple Vitamin (MULTIVITAMIN WITH MINERALS) TABS tablet Take 1 tablet by mouth daily. 30 tablet 0   pravastatin (PRAVACHOL) 40 MG tablet Take 1 tablet (40 mg total) by mouth daily. 30 tablet 0   senna-docusate (SENOKOT-S) 8.6-50 MG tablet Take 2 tablets by mouth 2 (two) times daily. 60 tablet 0   thiamine (VITAMIN B1) 100 MG tablet Take 1 tablet (100 mg total) by mouth daily. 30 tablet 0   Vitamin D, Ergocalciferol, (DRISDOL) 1.25 MG (50000 UNIT) CAPS capsule Take 50,000 Units by mouth every 7 (seven) days.     Water For Irrigation, Sterile (FREE WATER) SOLN Place 400 mLs into feeding tube 4 (four) times daily - after meals and at bedtime. Use filtered water (NOT DISTILLED, TAP OR WELL WATER)     No current facility-administered medications on file prior to visit.    No Known Allergies  Social History   Substance and Sexual Activity  Alcohol Use Yes   Alcohol/week: 7.0 standard drinks of alcohol   Types: 7 Cans of beer per week   Comment: daily, 40oz beer    Social History   Tobacco Use  Smoking Status Former   Types: Cigars  Smokeless Tobacco Never    Review of Systems  Constitutional: Negative.   HENT: Negative.    Eyes: Negative.   Respiratory: Negative.    Cardiovascular: Negative.   Gastrointestinal: Negative.   Genitourinary: Negative.   Musculoskeletal: Negative.   Skin: Negative.   Neurological: Negative.   Endo/Heme/Allergies: Negative.   Psychiatric/Behavioral: Negative.      Objective   Vitals:   08/19/23 0931  BP: (!) 205/94  Pulse: 66  Resp: 16  Temp: 97.8 F (36.6 C)  SpO2: 97%    Physical Exam Vitals reviewed.   Constitutional:      Appearance: Normal appearance. He is normal weight. He is not ill-appearing.  HENT:     Head: Normocephalic and atraumatic.  Cardiovascular:     Rate and Rhythm: Normal rate and regular rhythm.     Heart sounds: Normal heart sounds. No murmur heard.    No friction rub. No gallop.  Pulmonary:     Effort: Pulmonary effort is normal. No respiratory distress.     Breath sounds: Normal breath sounds. No stridor. No wheezing, rhonchi or rales.  Abdominal:     General: Bowel sounds are normal. There is no distension.     Palpations: Abdomen is soft. There is no mass.     Tenderness: There is no abdominal tenderness. There is no guarding or rebound.     Hernia: No hernia is present.     Comments: PEG tube in place left upper quadrant.  The balloon was deflated and the PEG was removed without difficulty.  Silver nitrate was applied to the granulation tissue.  A dressing was applied.  Skin:    General: Skin is warm and dry.  Neurological:     Mental Status: He is alert and oriented to person, place, and time.     Assessment  PEG tube removal Plan  Keep wound clean and dry with soap and water.  Follow-up here if the wound does not heal.

## 2023-08-25 ENCOUNTER — Inpatient Hospital Stay: Payer: Medicare PPO | Admitting: Neurology

## 2023-08-25 ENCOUNTER — Encounter: Payer: Self-pay | Admitting: Neurology

## 2023-08-30 ENCOUNTER — Ambulatory Visit: Payer: Medicare PPO | Attending: Nurse Practitioner | Admitting: Nurse Practitioner

## 2023-08-30 ENCOUNTER — Encounter: Payer: Self-pay | Admitting: Nurse Practitioner

## 2023-08-30 VITALS — BP 130/80 | HR 61 | Ht 65.0 in | Wt 163.2 lb

## 2023-08-30 DIAGNOSIS — R0989 Other specified symptoms and signs involving the circulatory and respiratory systems: Secondary | ICD-10-CM

## 2023-08-30 DIAGNOSIS — I483 Typical atrial flutter: Secondary | ICD-10-CM

## 2023-08-30 DIAGNOSIS — I5032 Chronic diastolic (congestive) heart failure: Secondary | ICD-10-CM

## 2023-08-30 DIAGNOSIS — I10 Essential (primary) hypertension: Secondary | ICD-10-CM | POA: Diagnosis not present

## 2023-08-30 DIAGNOSIS — I4891 Unspecified atrial fibrillation: Secondary | ICD-10-CM

## 2023-08-30 DIAGNOSIS — Z8673 Personal history of transient ischemic attack (TIA), and cerebral infarction without residual deficits: Secondary | ICD-10-CM

## 2023-08-30 DIAGNOSIS — E785 Hyperlipidemia, unspecified: Secondary | ICD-10-CM

## 2023-08-30 NOTE — Progress Notes (Signed)
Cardiology Office Note:  .   Date:  08/30/2023  ID:  Timothy [name], DOB [birth year], MRN [id number] PCP: Marylynn Pearson, FNP  [facility] Providers Cardiologist:  Donato Schultz, MD    History of Present Illness: .   ARAF CLUGSTON is a 68 y.o. male with a PMH of chronic systolic CHF, hx of A-fib/typical A-flutter, tachycardia, HTN, HLD, CKD, hx of polysubstance use, who presents today for overdue follow-up.   Last seen by Dr. Anne Fu on January 05, 2022. Was overall doing well at the time. EF in 2020 was around 20%.   Most recently, he underwent surgery for incarcerated left inguinal hernia in 2024. Afterwards, he was hospitalized in September 2024 due to acute ischemic left MCA infarct with left M2 occlusion, s/p IR with TICI2c secondary to A-fib on OAC vs large vessel disease. TTE revealed EF 60-65%, severe dilatation of left atrium, mild MR. Was started on Eliquis prior to d/c. UDS was positive for cocaine. Underwent thrombectomy per IR. Went to rehab after hospitalization. PEG tube was placed by IR on 10/14.   Today he presents for overdue follow-up.  Says he is doing well.  No longer has PEG tube and is recovering well from his stroke. Denies any chest pain, shortness of breath, palpitations, syncope, presyncope, dizziness, orthopnea, PND, swelling or significant weight changes, acute bleeding, or claudication.  Tolerating his medications well.  ROS: Negative. See HPI.  SH: Denies any tobacco use, drinks 1-2 alcoholic beverages per month, denies any illicit drug use.  Says he is no longer using cocaine. [employer].   Studies Reviewed: .    Echo 04/2023: 1. No significant LVOT gradient. Left ventricular ejection fraction, by  estimation, is 60 to 65%. The left ventricle has normal function. The left  ventricle has no regional wall motion abnormalities. There is severe  asymmetric left ventricular  hypertrophy of the basal-septal segment. Left ventricular diastolic   parameters are indeterminate.   2. Right ventricular systolic function is normal. The right ventricular  size is normal. Mildly increased right ventricular wall thickness.  Tricuspid regurgitation signal is inadequate for assessing PA pressure.   3. Left atrial size was severely dilated.   4. The mitral valve is grossly normal. Mild mitral valve regurgitation.  No evidence of mitral stenosis.   5. The aortic valve was not well visualized. There is mild calcification  of the aortic valve. There is moderate thickening of the aortic valve.  Aortic valve regurgitation is not visualized. No aortic stenosis is  present.   6. The inferior vena cava is normal in size with greater than 50%  respiratory variability, suggesting right atrial pressure of 3 mmHg.   Comparison(s): Prior images reviewed side by side. Normal function with  significant biventricular hypertrophy.      STOP-Bang Score:  3  {  Physical Exam:   VS:  BP 130/80   Pulse 61   Ht 5\' 5"  (1.651 m)   Wt 163 lb 3.2 oz (74 kg)   SpO2 97%   BMI 27.16 kg/m    Wt Readings from Last 3 Encounters:  08/30/23 163 lb 3.2 oz (74 kg)  08/19/23 166 lb (75.3 kg)  05/31/23 162 lb 9.6 oz (73.8 kg)    GEN: Well nourished, well developed in no acute distress NECK: No JVD; right carotid bruit, no left carotid bruit  CARDIAC: S1/S2, irregular regular rhythm, no murmurs, rubs, gallops RESPIRATORY:  Clear to auscultation without rales, wheezing or rhonchi  ABDOMEN: Soft, non-tender, non-distended EXTREMITIES:  No edema; No deformity   ASSESSMENT AND PLAN: .   HFimpEF Stage C, NYHA class I symptoms.  EF has returned to normal as of September 2024, 60 to 65%. 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 No medication changes at this time. Low sodium diet, fluid restriction <2L, and daily weights encouraged. Educated to contact our office for weight gain of 2 lbs overnight or 5 lbs in one week.  Will request most recent labs from PCP's office.  A-fib/typical  A-flutter This appears to be pretty persistent/permanent after review of most recent EKGs.  He is asymptomatic and denies any tachycardia or palpitations.  Heart rate is well-controlled without AV nodal agents.  Doing well on Eliquis and denies any bleeding issues.  He is on appropriate dosage.  Continue Eliquis for stroke prophylaxis.  Discussed avoiding triggers to A-fib/A-flutter.  HTN BP stable. Discussed to monitor BP at home at least 2 hours after medications and sitting for 5-10 minutes.  No medication changes at this time. Heart healthy diet and regular cardiovascular exercise encouraged.   HLD Most recent LDL in September 24 was 91. Being managed by PCP. Will request most recent labs from PCP's office.  Continue pravastatin. Heart healthy diet and regular cardiovascular exercise encouraged.   Hx of CVA Recovering well and denies any issues.  No medication changes at this time.  Continue follow-up with PCP.  Carotid bruit Right carotid bruit noted on exam.  Will arrange carotid duplex.  Continue current medication regimen. Heart healthy diet and regular cardiovascular exercise encouraged.    Dispo: Patient is requesting care closer to home.  Follow-up with Dr. Jenene Slicker or APP in 6 months or sooner if anything changes.  Signed, Sharlene Dory, NP

## 2023-08-30 NOTE — Patient Instructions (Addendum)
Medication Instructions:   Your physician recommends that you continue on your current medications as directed. Please refer to the Current Medication list given to you today.  Labwork:  None  Testing/Procedures: Your physician has requested that you have a carotid duplex. This test is an ultrasound of the carotid arteries in your neck. It looks at blood flow through these arteries that supply the brain with blood. Allow one hour for this exam. There are no restrictions or special instructions.  Follow-Up:  Your physician recommends that you schedule a follow-up appointment in: 6 months.  Any Other Special Instructions Will Be Listed Below (If Applicable).  If you need a refill on your cardiac medications before your next appointment, please call your pharmacy.

## 2023-09-29 ENCOUNTER — Ambulatory Visit: Payer: Medicare PPO | Attending: Nurse Practitioner

## 2023-09-29 DIAGNOSIS — R0989 Other specified symptoms and signs involving the circulatory and respiratory systems: Secondary | ICD-10-CM | POA: Diagnosis not present

## 2023-10-18 ENCOUNTER — Other Ambulatory Visit: Payer: Self-pay | Admitting: Nurse Practitioner

## 2023-10-18 DIAGNOSIS — I5022 Chronic systolic (congestive) heart failure: Secondary | ICD-10-CM

## 2023-10-18 DIAGNOSIS — R0989 Other specified symptoms and signs involving the circulatory and respiratory systems: Secondary | ICD-10-CM

## 2023-12-08 ENCOUNTER — Encounter: Payer: Self-pay | Admitting: *Deleted

## 2023-12-23 ENCOUNTER — Encounter: Payer: Self-pay | Admitting: Internal Medicine

## 2023-12-23 ENCOUNTER — Telehealth: Payer: Self-pay | Admitting: *Deleted

## 2023-12-23 ENCOUNTER — Ambulatory Visit (INDEPENDENT_AMBULATORY_CARE_PROVIDER_SITE_OTHER): Admitting: Internal Medicine

## 2023-12-23 VITALS — BP 159/78 | HR 62 | Temp 97.6°F | Ht 65.0 in | Wt 160.1 lb

## 2023-12-23 DIAGNOSIS — I4892 Unspecified atrial flutter: Secondary | ICD-10-CM

## 2023-12-23 DIAGNOSIS — Z79899 Other long term (current) drug therapy: Secondary | ICD-10-CM

## 2023-12-23 DIAGNOSIS — Z7901 Long term (current) use of anticoagulants: Secondary | ICD-10-CM

## 2023-12-23 DIAGNOSIS — Z1211 Encounter for screening for malignant neoplasm of colon: Secondary | ICD-10-CM

## 2023-12-23 DIAGNOSIS — I483 Typical atrial flutter: Secondary | ICD-10-CM

## 2023-12-23 NOTE — Progress Notes (Signed)
 Primary Care Physician:  Jace Martinet, FNP Primary Gastroenterologist:  Dr. Mordechai April  Chief Complaint  Patient presents with   Colon Cancer Screening    Pt arrives for colonoscopy scheduling. Pt is currently on Eliquis  5 mg BID. Last TCS was a while ago. No issues/questions at this time.     HPI:   Timothy [name] is a 68 y.o. male who presents to the clinic today by referral from his PCP Jace Martinet to discuss screening colonoscopy.  Patient reports his last colonoscopy was greater than 10 years ago.  Unsure of findings.  Denies any family history of colorectal malignancy.  No melena hematochezia.  No abdominal pain.  No unintentional weight loss.  Denies any upper GI symptoms including heartburn, reflux, dysphagia/odynophagia, epigastric or chest pain.  Patient suffered an ischemic stroke in September 2024 requiring prolonged hospitalization and rehab.  He is mostly recovered from this.  He did need a temporary PEG tube at 1 point though this was removed January 2025.  Chronically on Eliquis  for atrial fib/flutter.  Past Medical History:  Diagnosis Date   Alcohol abuse    Atrial fibrillation (HCC)    CHF (congestive heart failure) (HCC)    CKD (chronic kidney disease) stage 3, GFR 30-59 ml/min (HCC)    Hyperlipidemia    Hypertension    Noncompliance     Past Surgical History:  Procedure Laterality Date   INGUINAL HERNIA REPAIR Right 07/17/2022   Procedure: HERNIA REPAIR INGUINAL INCARCERATED;  Surgeon: Awilda Bogus, MD;  Location: [facility];  Service: General;  Laterality: Right;   IR CT HEAD LTD  04/21/2023   IR GASTROSTOMY TUBE MOD SED  05/17/2023   IR PERCUTANEOUS ART THROMBECTOMY/INFUSION INTRACRANIAL INC DIAG ANGIO  04/21/2023   RADIOLOGY WITH ANESTHESIA N/A 04/21/2023   Procedure: IR WITH ANESTHESIA;  Surgeon: Radiologist, Medication, MD;  Location: [facility];  Service: Radiology;  Laterality: N/A;   XI ROBOTIC ASSISTED INGUINAL HERNIA REPAIR WITH MESH Left  03/19/2023   Procedure: XI ROBOTIC ASSISTED INGUINAL HERNIA REPAIR WITH MESH;  Surgeon: Alanda Allegra, MD;  Location: [facility];  Service: General;  Laterality: Left;    Current Outpatient Medications  Medication Sig Dispense Refill   acetaminophen  (TYLENOL ) 325 MG tablet Take 2 tablets (650 mg total) by mouth every 4 (four) hours as needed for mild pain (pain score 1-3).     amLODipine  (NORVASC ) 10 MG tablet Take 10 mg by mouth daily.     apixaban  (ELIQUIS ) 5 MG TABS tablet Take 1 tablet (5 mg total) by mouth 2 (two) times daily. 60 tablet 0   folic acid  (FOLVITE ) 1 MG tablet Take 1 tablet (1 mg total) by mouth daily. 30 tablet 0   isosorbide  dinitrate (ISORDIL ) 20 MG tablet Take 1 tablet (20 mg total) by mouth 3 (three) times daily. 90 tablet 0   Multiple Vitamin (MULTIVITAMIN WITH MINERALS) TABS tablet Take 1 tablet by mouth daily. 30 tablet 0   pravastatin  (PRAVACHOL ) 40 MG tablet Take 1 tablet (40 mg total) by mouth daily. 30 tablet 0   senna-docusate (SENOKOT-S) 8.6-50 MG tablet Take 2 tablets by mouth 2 (two) times daily. 60 tablet 0   thiamine  (VITAMIN B1) 100 MG tablet Take 1 tablet (100 mg total) by mouth daily. 30 tablet 0   Vitamin D , Ergocalciferol , (DRISDOL) 1.25 MG (50000 UNIT) CAPS capsule Take 50,000 Units by mouth every 7 (seven) days.     Water  For Irrigation, Sterile (FREE WATER ) SOLN Place 400 mLs  into feeding tube 4 (four) times daily - after meals and at bedtime. Use filtered water  (NOT DISTILLED, TAP OR WELL WATER )     hydrocerin (EUCERIN) CREA Apply 1 Application topically 2 (two) times daily. (Patient not taking: Reported on 12/23/2023)     No current facility-administered medications for this visit.    Allergies as of 12/23/2023   (No Known Allergies)    Family History  Problem Relation Age of Onset   Diabetes Mother    Hypertension Mother    Cancer Father        Prostate Cancer   Hypertension Father    Diabetes Sister    Diabetes Brother    Hypertension  Brother    Hypertension Brother     Social History   Socioeconomic History   Marital status: Married    Spouse name: Not on file   Number of children: 3   Years of education: Not on file   Highest education level: Not on file  Occupational History   Occupation: Retired Naval architect  Tobacco Use   Smoking status: Former    Types: Cigars   Smokeless tobacco: Never  Vaping Use   Vaping status: Never Used  Substance and Sexual Activity   Alcohol use: Yes    Alcohol/week: 7.0 standard drinks of alcohol    Types: 7 Cans of beer per week    Comment: daily, 40oz beer   Drug use: Yes    Types: Cocaine    Comment: 04/18/23, does it once a week.   Sexual activity: Not on file  Other Topics Concern   Not on file  Social History Narrative   Not on file   Social Drivers of Health   Financial Resource Strain: Not on file  Food Insecurity: Not on file  Transportation Needs: Not on file  Physical Activity: Not on file  Stress: Not on file  Social Connections: Not on file  Intimate Partner Violence: Not on file    Subjective: Review of Systems  Constitutional:  Negative for chills and fever.  HENT:  Negative for congestion and hearing loss.   Eyes:  Negative for blurred vision and double vision.  Respiratory:  Negative for cough and shortness of breath.   Cardiovascular:  Negative for chest pain and palpitations.  Gastrointestinal:  Negative for abdominal pain, blood in stool, constipation, diarrhea, heartburn, melena and vomiting.  Genitourinary:  Negative for dysuria and urgency.  Musculoskeletal:  Negative for joint pain and myalgias.  Skin:  Negative for itching and rash.  Neurological:  Negative for dizziness and headaches.  Psychiatric/Behavioral:  Negative for depression. The patient is not nervous/anxious.        Objective: BP (!) 159/78   Pulse 62   Temp 97.6 F (36.4 C)   Ht 5\' 5"  (1.651 m)   Wt 160 lb 1.6 oz (72.6 kg)   BMI 26.64 kg/m  Physical  Exam Constitutional:      Appearance: Normal appearance.  HENT:     Head: Normocephalic and atraumatic.  Eyes:     Extraocular Movements: Extraocular movements intact.     Conjunctiva/sclera: Conjunctivae normal.  Cardiovascular:     Rate and Rhythm: Normal rate and regular rhythm.  Pulmonary:     Effort: Pulmonary effort is normal.     Breath sounds: Normal breath sounds.  Abdominal:     General: Bowel sounds are normal.     Palpations: Abdomen is soft.  Musculoskeletal:        General: Normal  range of motion.     Cervical back: Normal range of motion and neck supple.  Skin:    General: Skin is warm.  Neurological:     General: No focal deficit present.     Mental Status: He is alert and oriented to person, place, and time.  Psychiatric:        Mood and Affect: Mood normal.        Behavior: Behavior normal.      Assessment: *Colon cancer screening *Atrial fib/flutter *Chronic anticoagulation with Eliquis   Plan: Will schedule for screening colonoscopy.The risks including infection, bleed, or perforation as well as benefits, limitations, alternatives and imponderables have been reviewed with the patient. Questions have been answered. All parties agreeable.  Will reach out to patient's cardiologist for clearance to hold Eliquis  x 2 days as well as medical clearance given his stroke in September.  Appreciate their help immensely.  Once we have heard back, we can potentially schedule him for his procedure.  Thank you Jace Martinet for the kind referral.  12/23/2023 11:43 AM   Disclaimer: This note was dictated with voice recognition software. Similar sounding words can inadvertently be transcribed and may not be corrected upon review.

## 2023-12-23 NOTE — Patient Instructions (Signed)
 We will schedule you for colonoscopy for colon cancer screening purposes.  You will need to hold your Eliquis  x 2 days prior to procedure.  We will reach out to your cardiologist to ensure that this is safe to do.  Once we have heard back from them we will call you to schedule your procedure.  It was very nice meeting you today.  Dr. Mordechai April

## 2023-12-23 NOTE — Telephone Encounter (Signed)
 Pharmacy please advise on holding Eliquis prior to colonoscopy scheduled for TBD. Thank you.

## 2023-12-23 NOTE — Telephone Encounter (Signed)
  Request for patient to stop medication prior to procedure or is needing cleareance  12/23/23  Timothy [name] 1955-11-25  What type of surgery is being performed? COLONOSCOPY  When is surgery scheduled? TBD  What type of clearance is required (medical or pharmacy to hold medication or both? MEDICAL AND MEDICATION  Are there any medications that need to be held prior to surgery and how long? ELIQUIS  X 2 DAYS PRIOR  Name of physician performing surgery?  Dr. Goble Last [facility]enter[facility] at [facility] Phone: [phone] Fax: [phone]  Anethesia type (none, local, MAC, general)? MAC

## 2023-12-24 NOTE — Telephone Encounter (Signed)
   Name: Timothy [name]  DOB: 15-Jul-1956  MRN: [id number]  Primary Cardiologist: Dorothye Gathers, MD   Preoperative team, please contact this patient and set up a phone call appointment for further preoperative risk assessment. Please obtain consent and complete medication review. Thank you for your help.  I confirm that guidance regarding antiplatelet and oral anticoagulation therapy has been completed and, if necessary, noted below.  Per office protocol, patient can hold Eliquis  for 1 days prior to procedure.    I also confirmed the patient resides in the state of [place] . As per [facility] Medical Board telemedicine laws, the patient must reside in the state in which the provider is licensed.   Francene Ing, Retha Cast, NP 12/24/2023, 7:59 AM [facility]

## 2023-12-24 NOTE — Telephone Encounter (Signed)
 Patient with diagnosis of atrial fibrillation on Eliquis  for anticoagulation.    What type of surgery is being performed? COLONOSCOPY   When is surgery scheduled? TBD   CHA2DS2-VASc Score = 5   This indicates a 7.2% annual risk of stroke. The patient's score is based upon: CHF History: 1 HTN History: 1 Diabetes History: 0 Stroke History: 2 Vascular Disease History: 0 Age Score: 1 Gender Score: 0   CVA was 04/2023  CrCl 54 Platelet count 123  Patient has not had an Afib/aflutter ablation within the last 3 months or DCCV within the last 30 days  Per office protocol, patient can hold Eliquis  for 1 days prior to procedure.   Patient will not need bridging with Lovenox  (enoxaparin ) around procedure.  **This guidance is not considered finalized until pre-operative APP has relayed final recommendations.**

## 2023-12-24 NOTE — Telephone Encounter (Signed)
 Tried calling patient to schedule televisit no answer left a detailed message to call back and schedule

## 2023-12-29 ENCOUNTER — Telehealth: Payer: Self-pay

## 2023-12-29 NOTE — Telephone Encounter (Signed)
 LVM asking pt to call office to schedule virtual visit for preop clearance. Please reach out to preop team is pt returns call. Thank you.

## 2023-12-30 NOTE — Telephone Encounter (Signed)
 3rd attempt to reach pt to schedule tele pre-op appt. Will update requesting office. Pt needs to call for appt. Will remove from pre-op callback.

## 2024-04-18 ENCOUNTER — Ambulatory Visit: Admitting: Nurse Practitioner

## 2024-05-16 ENCOUNTER — Ambulatory Visit: Payer: Self-pay | Attending: Nurse Practitioner | Admitting: Nurse Practitioner

## 2024-05-16 ENCOUNTER — Encounter: Payer: Self-pay | Admitting: Nurse Practitioner

## 2024-05-16 VITALS — BP 172/84 | HR 61 | Ht 65.0 in | Wt 157.0 lb

## 2024-05-16 DIAGNOSIS — I483 Typical atrial flutter: Secondary | ICD-10-CM | POA: Diagnosis not present

## 2024-05-16 DIAGNOSIS — I4891 Unspecified atrial fibrillation: Secondary | ICD-10-CM

## 2024-05-16 DIAGNOSIS — I5022 Chronic systolic (congestive) heart failure: Secondary | ICD-10-CM

## 2024-05-16 DIAGNOSIS — I5020 Unspecified systolic (congestive) heart failure: Secondary | ICD-10-CM

## 2024-05-16 DIAGNOSIS — I10 Essential (primary) hypertension: Secondary | ICD-10-CM

## 2024-05-16 DIAGNOSIS — Z8673 Personal history of transient ischemic attack (TIA), and cerebral infarction without residual deficits: Secondary | ICD-10-CM

## 2024-05-16 DIAGNOSIS — Z131 Encounter for screening for diabetes mellitus: Secondary | ICD-10-CM

## 2024-05-16 DIAGNOSIS — E785 Hyperlipidemia, unspecified: Secondary | ICD-10-CM

## 2024-05-16 NOTE — Patient Instructions (Addendum)
 Medication Instructions:  Your physician recommends that you continue on your current medications as directed. Please refer to the Current Medication list given to you today.  Labwork: In 1-2 weeks with [facility]   Testing/Procedures: None   Follow-Up: Your physician recommends that you schedule a follow-up appointment in:  2-3 week Nurse visit for a Blood Pressure check. Please bring your home blood pressure log to this visit  6 Months   Any Other Special Instructions Will Be Listed Below (If Applicable).  If you need a refill on your cardiac medications before your next appointment, please call your pharmacy.   (Patient Name)-_____________________________  (MRN)-_________________________     (Physician)-_________________________________   (DATE) -_________ (Blood Pressure)-_________  (Heart Rate)-_________    (DATE) -_________ (Blood Pressure)-_________  (Heart Rate)-_________    (DATE) -_________ (Blood Pressure)-_________  (Heart Rate)-_________   (DATE) -_________ (Blood Pressure)-_________  (Heart Rate)-_________    (DATE) -_________ (Blood Pressure)-_________  (Heart Rate)-_________    (DATE) -_________ (Blood Pressure)-_________  (Heart Rate)-_________   (DATE) -_________ (Blood Pressure)-_________  (Heart Rate)-_________    (DATE) -_________ (Blood Pressure)-_________  (Heart Rate)-_________    (DATE) -_________ (Blood Pressure)-_________  (Heart Rate)-_________    (DATE) -_________ (Blood Pressure)-_________  (Heart Rate)-_________    (DATE) -_________ (Blood Pressure)-_________  (Heart Rate)-_________    (DATE) -_________ (Blood Pressure)-_________  (Heart Rate)-_________    (DATE) -_________ (Blood Pressure)-_________  (Heart Rate)-_________    (DATE) -_________ (Blood Pressure)-_________  (Heart Rate)-_________

## 2024-05-16 NOTE — Progress Notes (Unsigned)
 Cardiology Office Note:  .   Date:  05/16/2024 ID:  Timothy [name], DOB March 18, 1956, MRN [id number] PCP: Timothy Lurie, FNP (Inactive)  [facility] Providers Cardiologist:  Timothy Parchment, MD    History of Present Illness: .   Timothy [name] is a 68 y.o. male with a PMH of chronic systolic CHF, hx of A-fib/typical A-flutter, tachycardia, HTN, HLD, CKD, hx of polysubstance use, who presents today for overdue follow-up.   Last seen by Dr. Parchment on January 05, 2022. Was overall doing well at the time. EF in 2020 was around 20%.   Most recently, he underwent surgery for incarcerated left inguinal hernia in 2024. Afterwards, he was hospitalized in September 2024 due to acute ischemic left MCA infarct with left M2 occlusion, s/p IR with TICI2c secondary to A-fib on OAC vs large vessel disease. TTE revealed EF 60-65%, severe dilatation of left atrium, mild MR. Was started on Eliquis  prior to d/c. UDS was positive for cocaine. Underwent thrombectomy per IR. Went to rehab after hospitalization. PEG tube was placed by IR on 10/14.   08/30/2023 - Today he presents for overdue follow-up.  Says he is doing well.  No longer has PEG tube and is recovering well from his stroke. Denies any chest pain, shortness of breath, palpitations, syncope, presyncope, dizziness, orthopnea, PND, swelling or significant weight changes, acute bleeding, or claudication.  Tolerating his medications well.  05/16/2024 - Here for follow-up. Overall doing well.  Daughter who is present in the room stated he has been having a nightly cough, this is gotten better after increasing his Prilosec dosage.  Does not check BP at home. Feeling well. Denies any chest pain, shortness of breath, palpitations, syncope, presyncope, dizziness, orthopnea, PND, swelling or significant weight changes, acute bleeding, or claudication.  ROS: Negative. See HPI.  SH: Denies any tobacco use, drinks 1-2 alcoholic beverages per month, denies any  illicit drug use.  Says he is no longer using cocaine. [employer].   Studies Reviewed: SABRA    EKG:  EKG Interpretation Date/Time:  Tuesday May 16 2024 11:22:17 EDT Ventricular Rate:  61 PR Interval:  304 QRS Duration:  106 QT Interval:  436 QTC Calculation: 438 R Axis:   3  Text Interpretation: Sinus rhythm with 1st degree A-V block Cannot rule out Inferior infarct , age undetermined When compared with ECG of 21-Apr-2023 11:38, PREVIOUS ECG IS PRESENT Confirmed by Miriam Norris [phone]) on 05/16/2024 11:26:09 AM   Carotid duplex 10/2023: Summary:  Right Carotid: There is no evidence of stenosis in the right ICA. The ECAappears <50% stenosed. The extracranial vessels were near-normal with only minimal wall thickening or plaque. Tortuosity of the CCA and ICA probable rource of bruit.   Left Carotid: There is no evidence of stenosis in the left ICA. The ECA  appears <50% stenosed. The extracranial vessels were near-normal  with only minimal wall thickening or plaque. Tortuosity of the CCA and  ICA probable rource of bruit.   Vertebrals:  Bilateral vertebral arteries demonstrate antegrade flow.  Subclavians: Normal flow hemodynamics were seen in bilateral subclavian arteries.   *See table(s) above for measurements and observations.  Suggest follow up study in 12 months.  Echo 04/2023: 1. No significant LVOT gradient. Left ventricular ejection fraction, by  estimation, is 60 to 65%. The left ventricle has normal function. The left  ventricle has no regional wall motion abnormalities. There is severe  asymmetric left ventricular  hypertrophy of the basal-septal segment. Left ventricular  diastolic  parameters are indeterminate.   2. Right ventricular systolic function is normal. The right ventricular  size is normal. Mildly increased right ventricular wall thickness.  Tricuspid regurgitation signal is inadequate for assessing PA pressure.   3. Left atrial size was severely  dilated.   4. The mitral valve is grossly normal. Mild mitral valve regurgitation.  No evidence of mitral stenosis.   5. The aortic valve was not well visualized. There is mild calcification  of the aortic valve. There is moderate thickening of the aortic valve.  Aortic valve regurgitation is not visualized. No aortic stenosis is  present.   6. The inferior vena cava is normal in size with greater than 50%  respiratory variability, suggesting right atrial pressure of 3 mmHg.   Comparison(s): Prior images reviewed side by side. Normal function with  significant biventricular hypertrophy.  STOP-Bang Score:  3  {  Physical Exam:   VS:  BP (!) 172/84   Pulse 61   Ht 5' 5 (1.651 m)   Wt 157 lb (71.2 kg)   SpO2 97%   BMI 26.13 kg/m    Wt Readings from Last 3 Encounters:  05/16/24 157 lb (71.2 kg)  12/23/23 160 lb 1.6 oz (72.6 kg)  08/30/23 163 lb 3.2 oz (74 kg)    GEN: Well nourished, well developed in no acute distress NECK: No JVD; right carotid bruit, no left carotid bruit  CARDIAC: S1/S2, irregular regular rhythm, no murmurs, rubs, gallops RESPIRATORY:  Clear to auscultation without rales, wheezing or rhonchi  ABDOMEN: Soft, non-tender, non-distended EXTREMITIES:  No edema; No deformity   ASSESSMENT AND PLAN: .   HFimpEF Stage C, NYHA class I symptoms.  EF has returned to normal as of September 2024, 60 to 65%. 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  No medication changes at this time. Low sodium diet, fluid restriction <2L, and daily weights encouraged. Educated to contact our office for weight gain of 2 lbs overnight or 5 lbs in one week.  Will obtain CBC, CMET.  A-fib/typical A-flutter This appears to be pretty persistent/permanent after review of most recent EKGs.  He is asymptomatic and denies any tachycardia or palpitations.  Heart rate is well-controlled without AV nodal agents.  Doing well on Eliquis  and denies any bleeding issues.  He is on appropriate dosage.  Continue Eliquis  for stroke  prophylaxis.  Discussed avoiding triggers to A-fib/A-flutter. Will obtain thyroid panel.  HTN BP elevated today.  Does not check BP at home.  Discussed to monitor BP at home at least 2 hours after medications and sitting for 5-10 minutes.  No medication changes at this time. Heart healthy diet and regular cardiovascular exercise encouraged.  Will bring him back for nurse visit for BP check in 2 to 3 weeks.  Obtaining labs as noted above.  If blood pressure is not at goal by next office visit, consider valsartan or losartan or low dose Entresto.   HLD Most recent LDL in September 24 was 91.  He is due for labs.  Will obtain FLP and CMET.  Continue pravastatin . Heart healthy diet and regular cardiovascular exercise encouraged.   Hx of CVA Denies any recent issues.  No medication changes at this time.  Continue follow-up with PCP.  6.  Screening for type 2 diabetes I do not see a recent A1c on file.  Will obtain in addition to labs as noted above. Heart healthy diet and regular cardiovascular exercise encouraged.    Dispo: Follow-up with Dr. Mallipeddi or APP  in 6 months or sooner if anything changes.  Signed, Almarie Crate, NP

## 2024-05-24 NOTE — Telephone Encounter (Signed)
Please advise regarding clearance  

## 2024-06-01 ENCOUNTER — Telehealth: Payer: Self-pay | Admitting: *Deleted

## 2024-06-01 NOTE — Telephone Encounter (Signed)
 Timothy [name], you recently saw this pt in clinic. Are you able to comment on surgical clearance for upcoming colonscopy scheduled for TBD? Please route your response to P CV DIV PREOP. Thank you!

## 2024-06-01 NOTE — Telephone Encounter (Signed)
 Timothy [name] [birth year]   What type of surgery is being performed? COLONOSCOPY   When is surgery scheduled? TBD   What type of clearance is required (medical or pharmacy to hold medication or both? MEDICAL AND MEDICATION   Are there any medications that need to be held prior to surgery and how long? ELIQUIS  X 2 DAYS PRIOR   Name of physician performing surgery?  Dr. Carlin Hasty [facility] Gastroenterology at [facility] Phone: [phone] Fax: [phone]   Anethesia type (none, local, MAC, general)? MAC

## 2024-06-05 NOTE — Progress Notes (Signed)
  Timothy [name]'s perioperative risk of a major cardiac event is 6.6% according to the Revised Cardiac Risk Index (RCRI).  Therefore, he is at high risk for perioperative complications.   His functional capacity is excellent at 6.61 METs according to the Duke Activity Status Index (DASI). Recommendations: According to ACC/AHA guidelines, no further cardiovascular testing needed.  The patient may proceed to surgery at acceptable risk.   Antiplatelet and/or Anticoagulation Recommendations: Clinical pharm D needs to comment on how long Eliquis  needs to be held prior to procedure.   Almarie Crate, NP

## 2024-06-06 ENCOUNTER — Ambulatory Visit

## 2024-06-06 NOTE — Telephone Encounter (Signed)
 Timothy [name]'s perioperative risk of a major cardiac event is 6.6% according to the Revised Cardiac Risk Index (RCRI).  Therefore, he is at high risk for perioperative complications.   His functional  capacity is excellent at 6.61 METs according to the Duke Activity Status Index (DASI).  Recommendations:  According to ACC/AHA guidelines, no further cardiovascular testing needed.  The patient may proceed to surgery at acceptable risk.      Per office protocol, he may hold Eliquis  for 2 days prior to procedure and should resume as soon as hemodynamically stable postoperatively.  Request will be forwarded to requesting provider and removed from preop pool.  Rosaline EMERSON Bane, NP-C  06/06/2024, 8:22 AM [address], [address] [place], [place] [address] Office [phone] Fax [phone]

## 2024-06-06 NOTE — Telephone Encounter (Signed)
 Patient with diagnosis of afib on Eliquis  for anticoagulation.    Procedure: COLONOSCOPY  Date of procedure: TBD   CHA2DS2-VASc Score = 5   This indicates a 7.2% annual risk of stroke. The patient's score is based upon: CHF History: 1 HTN History: 1 Diabetes History: 0 Stroke History: 2 Vascular Disease History: 0 Age Score: 1 Gender Score: 0      CrCl 51 ml/min Platelet count 128  Patient had a CVA in 2024 prior to being on Eliquis .   Patient has not had an Afib/aflutter ablation in the last 3 months, DCCV within the last 4 weeks or a watchman implanted in the last 45 days   Per office protocol, patient can hold Eliquis  for 2 days prior to procedure.    **This guidance is not considered finalized until pre-operative APP has relayed final recommendations.**

## 2024-06-09 NOTE — Telephone Encounter (Signed)
 Per Dr. Cindie ok to schedule  Called pt, LMOVM to call back to schedule TCS with Dr. Cindie, ASA 3, hold eliquis  x 2 days prior

## 2024-06-13 NOTE — Telephone Encounter (Signed)
 LMOVM to call back. Letter mailed. ?

## 2024-10-17 ENCOUNTER — Encounter
